# Patient Record
Sex: Female | Born: 1935
Health system: Southern US, Community
[De-identification: ages and names within clinical notes are randomized; demographics above are authoritative.]

## PROBLEM LIST (undated history)

## (undated) DIAGNOSIS — K219 Gastro-esophageal reflux disease without esophagitis: Secondary | ICD-10-CM

## (undated) DIAGNOSIS — M810 Age-related osteoporosis without current pathological fracture: Secondary | ICD-10-CM

## (undated) DIAGNOSIS — F32A Depression, unspecified: Secondary | ICD-10-CM

## (undated) DIAGNOSIS — A0472 Enterocolitis due to Clostridium difficile, not specified as recurrent: Secondary | ICD-10-CM

## (undated) DIAGNOSIS — C946 Myelodysplastic disease, not classified: Secondary | ICD-10-CM

## (undated) DIAGNOSIS — N189 Chronic kidney disease, unspecified: Secondary | ICD-10-CM

## (undated) DIAGNOSIS — E039 Hypothyroidism, unspecified: Secondary | ICD-10-CM

## (undated) DIAGNOSIS — D649 Anemia, unspecified: Secondary | ICD-10-CM

## (undated) DIAGNOSIS — F329 Major depressive disorder, single episode, unspecified: Secondary | ICD-10-CM

## (undated) DIAGNOSIS — M1990 Unspecified osteoarthritis, unspecified site: Secondary | ICD-10-CM

## (undated) DIAGNOSIS — R0600 Dyspnea, unspecified: Secondary | ICD-10-CM

## (undated) DIAGNOSIS — C801 Malignant (primary) neoplasm, unspecified: Secondary | ICD-10-CM

## (undated) HISTORY — PX: ABDOMINAL HYSTERECTOMY: SHX81

## (undated) HISTORY — PX: CATARACT EXTRACTION W/ INTRAOCULAR LENS IMPLANT: SHX1309

---

## 1996-08-05 HISTORY — PX: APPENDECTOMY: SHX54

## 1996-08-05 HISTORY — PX: CHOLECYSTECTOMY: SHX55

## 2000-09-10 ENCOUNTER — Other Ambulatory Visit: Admission: RE | Admit: 2000-09-10 | Discharge: 2000-09-10 | Payer: Self-pay | Admitting: Internal Medicine

## 2001-11-11 ENCOUNTER — Ambulatory Visit (HOSPITAL_COMMUNITY): Admission: RE | Admit: 2001-11-11 | Discharge: 2001-11-11 | Payer: Self-pay | Admitting: Gastroenterology

## 2001-11-11 ENCOUNTER — Encounter (INDEPENDENT_AMBULATORY_CARE_PROVIDER_SITE_OTHER): Payer: Self-pay | Admitting: Specialist

## 2006-12-03 ENCOUNTER — Ambulatory Visit (HOSPITAL_COMMUNITY): Admission: RE | Admit: 2006-12-03 | Discharge: 2006-12-03 | Payer: Self-pay | Admitting: Internal Medicine

## 2006-12-23 ENCOUNTER — Ambulatory Visit: Payer: Self-pay | Admitting: Vascular Surgery

## 2006-12-23 ENCOUNTER — Encounter (INDEPENDENT_AMBULATORY_CARE_PROVIDER_SITE_OTHER): Payer: Self-pay | Admitting: Internal Medicine

## 2006-12-23 ENCOUNTER — Ambulatory Visit: Admission: RE | Admit: 2006-12-23 | Discharge: 2006-12-23 | Payer: Self-pay | Admitting: Internal Medicine

## 2007-04-13 ENCOUNTER — Other Ambulatory Visit: Admission: RE | Admit: 2007-04-13 | Discharge: 2007-04-13 | Payer: Self-pay | Admitting: Gastroenterology

## 2007-12-09 ENCOUNTER — Ambulatory Visit (HOSPITAL_COMMUNITY): Admission: RE | Admit: 2007-12-09 | Discharge: 2007-12-09 | Payer: Self-pay | Admitting: Internal Medicine

## 2008-03-26 ENCOUNTER — Emergency Department (HOSPITAL_COMMUNITY): Admission: EM | Admit: 2008-03-26 | Discharge: 2008-03-26 | Payer: Self-pay | Admitting: Emergency Medicine

## 2008-12-26 ENCOUNTER — Ambulatory Visit (HOSPITAL_COMMUNITY): Admission: RE | Admit: 2008-12-26 | Discharge: 2008-12-26 | Payer: Self-pay | Admitting: Internal Medicine

## 2010-01-09 ENCOUNTER — Ambulatory Visit (HOSPITAL_COMMUNITY): Admission: RE | Admit: 2010-01-09 | Discharge: 2010-01-09 | Payer: Self-pay | Admitting: Internal Medicine

## 2010-12-21 NOTE — Op Note (Signed)
[facility]  Patient:    Alison Bridges, Alison Bridges Visit Number: [id number] MRN: [id number]          Service Type: END Location: ENDO Attending Physician:  Rich Brave Dictated by:   Florencia Reasons, M.D. Proc. Date: 11/11/01 Admit Date:  11/11/2001   CC:         Richard A. Jacky Kindle, M.D.   Operative Report  PROCEDURE:  Colonoscopy.  INDICATIONS:  A 77 year old female with prior history of adenomatous polyp, having been removed about five years ago.  I believe there is also a family history of colon cancer.  FINDINGS:  Normal exam.  DESCRIPTION OF PROCEDURE:  The nature, purpose and risks of the procedure were familiar to the patient from prior examinations, and she provided written consent.  Sedation for this procedure and the upper endoscopy which preceded it was fentanyl 62.5 mcg and Versed 6 mg IV; without arrhythmias or desaturation.  The Olympus adjustable tension pediatric video colonoscope was advanced to the cecum, with looping that was overcome by having the patient in the supine position, and applying some external abdominal compression. Pullback was then performed.  The cecum was identified by visualization of the ileocecal valve.  The absence of further lumen, and transillumination of the right lower quadrant.  The quality of the prep was very good, and it is felt that all areas were well seen.  This was a normal examination.  No polyps, cancer, colitis, vascular malformations or diverticulosis were noted.  Retroflexion into the rectum showed internal hemorrhoids.  Reinspection of the rectosigmoid was unremarkable.  The patient tolerated the procedure well and there were no apparent complications.  IMPRESSION:  Normal colonoscopy.  PLAN:  Follow-up colonoscopy in five years, because of the prior history of a colonic adenoma and the possible family history of colon cancer. Dictated by: Florencia Reasons, M.D. Attending  Physician:  Rich Brave DD:  11/11/01 TD:  11/11/01 Job: [id number]

## 2010-12-21 NOTE — Procedures (Signed)
[facility]  Patient:    Alison Bridges, Alison Bridges Visit Number: [id number] MRN: [id number]          Service Type: END Location: ENDO Attending Physician:  Rich Brave Dictated by:   Florencia Reasons, M.D. Proc. Date: 11/11/01 Admit Date:  11/11/2001   CC:         Richard A. Jacky Kindle, M.D.   Procedure Report  PROCEDURE:  Upper endoscopy without biopsies.  INDICATION:  This 77 year old female has a longstanding history of achalasia and more recently has had esophageal spasms which seem to have gotten better since going on Prevacid therapy.  She has been off Prevacid for a period of time now but still is basically asymptomatic from the esophageal standpoint. Rule out esophageal cancer as a possible complication of achalasia.  FINDINGS:  Multiple small gastric polyps.  DESCRIPTION OF PROCEDURE:  The patient provided written consent.  Sedation was fentanyl 50 mcg and Versed 4-5 mg IV without arrhythmias or desaturation.  The Olympus video endoscope was passed under direct vision.  The vocal cords were not well seen.  The esophagus was readily entered and had normal mucosa.  The esophageal lumen was somewhat patulous consistent with a history of achalasia, and there was some refluxed fluid retained within the esophagus which was suctioned up.  I saw no evidence of neoplasia or esophagitis, Barretts esophagus or varices or infection.  No ring or stricture was evident.  The squamocolumnar junction was right at the diaphragmatic hiatus; there was no evidence of a hiatal hernia. The lower esophageal sphincter region did not appear particularly snug.  The stomach was entered and noted to contain a rather large, roughly 150 cc, bilious residual which was suctioned up.  There was no evidence of bile reflux gastritis, but there were multiple (dozens) of small hyperplastic-appearing polyps scattered in the fundus and body of the stomach and even in  the proximal antrum.  Approximately 5 or 10 of these were biopsied for sampling; it is presumed they probably represent fundic gland polyps.  There did not appear to be any large polyps or dominant masses within the gastric lumen nor anything that really looked suspicious.  No gastritis, erosions, or ulcers. Retroflexed viewing showed a normal cardia without evidence of neoplasia. The pylorus, duodenal bulb, and second duodenum looked normal.  The patient tolerated this procedure well, and there were no apparent complications.  IMPRESSION: 1. Patulous esophagus consistent with history of achalasia. 2. Multiple gastric polyps, probably fundic gland polyps.  PLAN:  Await pathology.  Could restart PPI therapy p.r.n. in the event of recurrent esophageal symptoms. Dictated by:   Florencia Reasons, M.D. Attending Physician:  Rich Brave DD:  11/11/01 TD:  11/11/01 Job: [phone] [id number]

## 2011-01-22 ENCOUNTER — Ambulatory Visit (HOSPITAL_COMMUNITY): Payer: Medicare Other | Attending: Internal Medicine

## 2011-01-22 DIAGNOSIS — M810 Age-related osteoporosis without current pathological fracture: Secondary | ICD-10-CM | POA: Insufficient documentation

## 2011-11-19 DIAGNOSIS — E785 Hyperlipidemia, unspecified: Secondary | ICD-10-CM | POA: Diagnosis not present

## 2011-11-19 DIAGNOSIS — M810 Age-related osteoporosis without current pathological fracture: Secondary | ICD-10-CM | POA: Diagnosis not present

## 2011-11-19 DIAGNOSIS — E039 Hypothyroidism, unspecified: Secondary | ICD-10-CM | POA: Diagnosis not present

## 2011-11-19 DIAGNOSIS — R82998 Other abnormal findings in urine: Secondary | ICD-10-CM | POA: Diagnosis not present

## 2011-11-26 DIAGNOSIS — Z1212 Encounter for screening for malignant neoplasm of rectum: Secondary | ICD-10-CM | POA: Diagnosis not present

## 2011-11-26 DIAGNOSIS — M810 Age-related osteoporosis without current pathological fracture: Secondary | ICD-10-CM | POA: Diagnosis not present

## 2011-11-26 DIAGNOSIS — E785 Hyperlipidemia, unspecified: Secondary | ICD-10-CM | POA: Diagnosis not present

## 2011-11-26 DIAGNOSIS — E039 Hypothyroidism, unspecified: Secondary | ICD-10-CM | POA: Diagnosis not present

## 2011-11-26 DIAGNOSIS — Z0000 Encounter for general adult medical examination without abnormal findings: Secondary | ICD-10-CM | POA: Diagnosis not present

## 2011-11-26 DIAGNOSIS — Z79899 Other long term (current) drug therapy: Secondary | ICD-10-CM | POA: Diagnosis not present

## 2011-12-24 DIAGNOSIS — R198 Other specified symptoms and signs involving the digestive system and abdomen: Secondary | ICD-10-CM | POA: Diagnosis not present

## 2011-12-24 DIAGNOSIS — R143 Flatulence: Secondary | ICD-10-CM | POA: Diagnosis not present

## 2011-12-24 DIAGNOSIS — Z800 Family history of malignant neoplasm of digestive organs: Secondary | ICD-10-CM | POA: Diagnosis not present

## 2011-12-24 DIAGNOSIS — R141 Gas pain: Secondary | ICD-10-CM | POA: Diagnosis not present

## 2012-01-21 DIAGNOSIS — M810 Age-related osteoporosis without current pathological fracture: Secondary | ICD-10-CM | POA: Diagnosis not present

## 2012-02-11 ENCOUNTER — Other Ambulatory Visit: Payer: Self-pay | Admitting: Gastroenterology

## 2012-02-11 DIAGNOSIS — K599 Functional intestinal disorder, unspecified: Secondary | ICD-10-CM | POA: Diagnosis not present

## 2012-02-11 DIAGNOSIS — R198 Other specified symptoms and signs involving the digestive system and abdomen: Secondary | ICD-10-CM | POA: Diagnosis not present

## 2012-02-11 DIAGNOSIS — D126 Benign neoplasm of colon, unspecified: Secondary | ICD-10-CM | POA: Diagnosis not present

## 2012-02-11 DIAGNOSIS — Z800 Family history of malignant neoplasm of digestive organs: Secondary | ICD-10-CM | POA: Diagnosis not present

## 2012-02-18 DIAGNOSIS — M810 Age-related osteoporosis without current pathological fracture: Secondary | ICD-10-CM | POA: Diagnosis not present

## 2012-02-18 DIAGNOSIS — Z79899 Other long term (current) drug therapy: Secondary | ICD-10-CM | POA: Diagnosis not present

## 2012-02-27 ENCOUNTER — Encounter (HOSPITAL_COMMUNITY): Payer: Self-pay

## 2012-02-27 ENCOUNTER — Encounter (HOSPITAL_COMMUNITY)
Admission: RE | Admit: 2012-02-27 | Discharge: 2012-02-27 | Disposition: A | Discharge status: Home / Self Care | Payer: Medicare Other | Source: Ambulatory Visit | Attending: Internal Medicine | Admitting: Internal Medicine

## 2012-02-27 DIAGNOSIS — M810 Age-related osteoporosis without current pathological fracture: Secondary | ICD-10-CM | POA: Insufficient documentation

## 2012-02-27 HISTORY — DX: Hypothyroidism, unspecified: E03.9

## 2012-02-27 HISTORY — DX: Major depressive disorder, single episode, unspecified: F32.9

## 2012-02-27 HISTORY — DX: Age-related osteoporosis without current pathological fracture: M81.0

## 2012-02-27 HISTORY — DX: Depression, unspecified: F32.A

## 2012-02-27 MED ORDER — ZOLEDRONIC ACID 5 MG/100ML IV SOLN
5.0000 mg | Freq: Once | INTRAVENOUS | Status: AC
  Administered 2012-02-27: 5 mg via INTRAVENOUS
  Filled 2012-02-27: qty 100

## 2012-02-27 MED ORDER — SODIUM CHLORIDE 0.9 % IV SOLN
INTRAVENOUS | Status: AC
  Administered 2012-02-27: 14:00:00 via INTRAVENOUS

## 2012-03-12 DIAGNOSIS — R198 Other specified symptoms and signs involving the digestive system and abdomen: Secondary | ICD-10-CM | POA: Diagnosis not present

## 2012-03-12 DIAGNOSIS — R141 Gas pain: Secondary | ICD-10-CM | POA: Diagnosis not present

## 2012-03-12 DIAGNOSIS — R1033 Periumbilical pain: Secondary | ICD-10-CM | POA: Diagnosis not present

## 2012-03-12 DIAGNOSIS — R143 Flatulence: Secondary | ICD-10-CM | POA: Diagnosis not present

## 2012-03-19 DIAGNOSIS — Z803 Family history of malignant neoplasm of breast: Secondary | ICD-10-CM | POA: Diagnosis not present

## 2012-03-19 DIAGNOSIS — Z1231 Encounter for screening mammogram for malignant neoplasm of breast: Secondary | ICD-10-CM | POA: Diagnosis not present

## 2012-04-29 DIAGNOSIS — Z23 Encounter for immunization: Secondary | ICD-10-CM | POA: Diagnosis not present

## 2012-05-12 DIAGNOSIS — R141 Gas pain: Secondary | ICD-10-CM | POA: Diagnosis not present

## 2012-05-12 DIAGNOSIS — K5901 Slow transit constipation: Secondary | ICD-10-CM | POA: Diagnosis not present

## 2012-06-22 DIAGNOSIS — H5200 Hypermetropia, unspecified eye: Secondary | ICD-10-CM | POA: Diagnosis not present

## 2012-06-22 DIAGNOSIS — H52209 Unspecified astigmatism, unspecified eye: Secondary | ICD-10-CM | POA: Diagnosis not present

## 2012-06-22 DIAGNOSIS — H3340 Traction detachment of retina, unspecified eye: Secondary | ICD-10-CM | POA: Diagnosis not present

## 2012-06-22 DIAGNOSIS — H2510 Age-related nuclear cataract, unspecified eye: Secondary | ICD-10-CM | POA: Diagnosis not present

## 2012-07-16 DIAGNOSIS — R141 Gas pain: Secondary | ICD-10-CM | POA: Diagnosis not present

## 2012-07-16 DIAGNOSIS — R142 Eructation: Secondary | ICD-10-CM | POA: Diagnosis not present

## 2012-07-16 DIAGNOSIS — K5901 Slow transit constipation: Secondary | ICD-10-CM | POA: Diagnosis not present

## 2012-12-01 DIAGNOSIS — E039 Hypothyroidism, unspecified: Secondary | ICD-10-CM | POA: Diagnosis not present

## 2012-12-01 DIAGNOSIS — M810 Age-related osteoporosis without current pathological fracture: Secondary | ICD-10-CM | POA: Diagnosis not present

## 2012-12-01 DIAGNOSIS — Z79899 Other long term (current) drug therapy: Secondary | ICD-10-CM | POA: Diagnosis not present

## 2012-12-01 DIAGNOSIS — E785 Hyperlipidemia, unspecified: Secondary | ICD-10-CM | POA: Diagnosis not present

## 2012-12-09 DIAGNOSIS — IMO0002 Reserved for concepts with insufficient information to code with codable children: Secondary | ICD-10-CM | POA: Diagnosis not present

## 2012-12-09 DIAGNOSIS — Z0000 Encounter for general adult medical examination without abnormal findings: Secondary | ICD-10-CM | POA: Diagnosis not present

## 2012-12-09 DIAGNOSIS — M810 Age-related osteoporosis without current pathological fracture: Secondary | ICD-10-CM | POA: Diagnosis not present

## 2012-12-09 DIAGNOSIS — G609 Hereditary and idiopathic neuropathy, unspecified: Secondary | ICD-10-CM | POA: Diagnosis not present

## 2012-12-09 DIAGNOSIS — E785 Hyperlipidemia, unspecified: Secondary | ICD-10-CM | POA: Diagnosis not present

## 2012-12-09 DIAGNOSIS — E039 Hypothyroidism, unspecified: Secondary | ICD-10-CM | POA: Diagnosis not present

## 2012-12-09 DIAGNOSIS — M1990 Unspecified osteoarthritis, unspecified site: Secondary | ICD-10-CM | POA: Diagnosis not present

## 2012-12-10 DIAGNOSIS — Z1212 Encounter for screening for malignant neoplasm of rectum: Secondary | ICD-10-CM | POA: Diagnosis not present

## 2013-07-09 DIAGNOSIS — H2510 Age-related nuclear cataract, unspecified eye: Secondary | ICD-10-CM | POA: Diagnosis not present

## 2013-07-09 DIAGNOSIS — H35379 Puckering of macula, unspecified eye: Secondary | ICD-10-CM | POA: Diagnosis not present

## 2013-07-09 DIAGNOSIS — H25019 Cortical age-related cataract, unspecified eye: Secondary | ICD-10-CM | POA: Diagnosis not present

## 2013-10-28 DIAGNOSIS — R141 Gas pain: Secondary | ICD-10-CM | POA: Diagnosis not present

## 2013-10-28 DIAGNOSIS — R198 Other specified symptoms and signs involving the digestive system and abdomen: Secondary | ICD-10-CM | POA: Diagnosis not present

## 2013-10-28 DIAGNOSIS — R1084 Generalized abdominal pain: Secondary | ICD-10-CM | POA: Diagnosis not present

## 2013-12-06 DIAGNOSIS — E039 Hypothyroidism, unspecified: Secondary | ICD-10-CM | POA: Diagnosis not present

## 2013-12-06 DIAGNOSIS — Z79899 Other long term (current) drug therapy: Secondary | ICD-10-CM | POA: Diagnosis not present

## 2013-12-06 DIAGNOSIS — M810 Age-related osteoporosis without current pathological fracture: Secondary | ICD-10-CM | POA: Diagnosis not present

## 2013-12-06 DIAGNOSIS — E785 Hyperlipidemia, unspecified: Secondary | ICD-10-CM | POA: Diagnosis not present

## 2013-12-13 DIAGNOSIS — M1990 Unspecified osteoarthritis, unspecified site: Secondary | ICD-10-CM | POA: Diagnosis not present

## 2013-12-13 DIAGNOSIS — Z1331 Encounter for screening for depression: Secondary | ICD-10-CM | POA: Diagnosis not present

## 2013-12-13 DIAGNOSIS — G609 Hereditary and idiopathic neuropathy, unspecified: Secondary | ICD-10-CM | POA: Diagnosis not present

## 2013-12-13 DIAGNOSIS — Z0000 Encounter for general adult medical examination without abnormal findings: Secondary | ICD-10-CM | POA: Diagnosis not present

## 2013-12-13 DIAGNOSIS — M810 Age-related osteoporosis without current pathological fracture: Secondary | ICD-10-CM | POA: Diagnosis not present

## 2013-12-13 DIAGNOSIS — E785 Hyperlipidemia, unspecified: Secondary | ICD-10-CM | POA: Diagnosis not present

## 2013-12-14 DIAGNOSIS — Z1212 Encounter for screening for malignant neoplasm of rectum: Secondary | ICD-10-CM | POA: Diagnosis not present

## 2014-01-06 DIAGNOSIS — K591 Functional diarrhea: Secondary | ICD-10-CM | POA: Diagnosis not present

## 2014-01-25 DIAGNOSIS — M899 Disorder of bone, unspecified: Secondary | ICD-10-CM | POA: Diagnosis not present

## 2014-01-25 DIAGNOSIS — M949 Disorder of cartilage, unspecified: Secondary | ICD-10-CM | POA: Diagnosis not present

## 2014-04-14 DIAGNOSIS — R198 Other specified symptoms and signs involving the digestive system and abdomen: Secondary | ICD-10-CM | POA: Diagnosis not present

## 2014-04-14 DIAGNOSIS — R143 Flatulence: Secondary | ICD-10-CM | POA: Diagnosis not present

## 2014-04-14 DIAGNOSIS — R141 Gas pain: Secondary | ICD-10-CM | POA: Diagnosis not present

## 2014-05-04 DIAGNOSIS — Z23 Encounter for immunization: Secondary | ICD-10-CM | POA: Diagnosis not present

## 2014-05-24 DIAGNOSIS — Z1231 Encounter for screening mammogram for malignant neoplasm of breast: Secondary | ICD-10-CM | POA: Diagnosis not present

## 2014-07-22 DIAGNOSIS — H25013 Cortical age-related cataract, bilateral: Secondary | ICD-10-CM | POA: Diagnosis not present

## 2014-07-22 DIAGNOSIS — H2513 Age-related nuclear cataract, bilateral: Secondary | ICD-10-CM | POA: Diagnosis not present

## 2014-07-22 DIAGNOSIS — H35371 Puckering of macula, right eye: Secondary | ICD-10-CM | POA: Diagnosis not present

## 2014-11-18 DIAGNOSIS — Z1389 Encounter for screening for other disorder: Secondary | ICD-10-CM | POA: Diagnosis not present

## 2014-11-18 DIAGNOSIS — M810 Age-related osteoporosis without current pathological fracture: Secondary | ICD-10-CM | POA: Diagnosis not present

## 2014-11-18 DIAGNOSIS — G629 Polyneuropathy, unspecified: Secondary | ICD-10-CM | POA: Diagnosis not present

## 2014-11-18 DIAGNOSIS — Z6821 Body mass index (BMI) 21.0-21.9, adult: Secondary | ICD-10-CM | POA: Diagnosis not present

## 2014-11-18 DIAGNOSIS — E039 Hypothyroidism, unspecified: Secondary | ICD-10-CM | POA: Diagnosis not present

## 2014-11-18 DIAGNOSIS — E785 Hyperlipidemia, unspecified: Secondary | ICD-10-CM | POA: Diagnosis not present

## 2014-11-18 DIAGNOSIS — M1990 Unspecified osteoarthritis, unspecified site: Secondary | ICD-10-CM | POA: Diagnosis not present

## 2014-11-19 ENCOUNTER — Emergency Department (HOSPITAL_COMMUNITY): Payer: Medicare Other

## 2014-11-19 ENCOUNTER — Encounter (HOSPITAL_COMMUNITY): Payer: Self-pay | Admitting: Emergency Medicine

## 2014-11-19 ENCOUNTER — Emergency Department (HOSPITAL_COMMUNITY)
Admission: EM | Admit: 2014-11-19 | Discharge: 2014-11-19 | Disposition: A | Discharge status: Home / Self Care | Payer: Medicare Other | Attending: Emergency Medicine | Admitting: Emergency Medicine

## 2014-11-19 DIAGNOSIS — Y9389 Activity, other specified: Secondary | ICD-10-CM | POA: Diagnosis not present

## 2014-11-19 DIAGNOSIS — F329 Major depressive disorder, single episode, unspecified: Secondary | ICD-10-CM | POA: Insufficient documentation

## 2014-11-19 DIAGNOSIS — Y9289 Other specified places as the place of occurrence of the external cause: Secondary | ICD-10-CM | POA: Insufficient documentation

## 2014-11-19 DIAGNOSIS — M810 Age-related osteoporosis without current pathological fracture: Secondary | ICD-10-CM | POA: Insufficient documentation

## 2014-11-19 DIAGNOSIS — M7989 Other specified soft tissue disorders: Secondary | ICD-10-CM | POA: Diagnosis not present

## 2014-11-19 DIAGNOSIS — Z79899 Other long term (current) drug therapy: Secondary | ICD-10-CM | POA: Insufficient documentation

## 2014-11-19 DIAGNOSIS — W07XXXA Fall from chair, initial encounter: Secondary | ICD-10-CM | POA: Insufficient documentation

## 2014-11-19 DIAGNOSIS — Y998 Other external cause status: Secondary | ICD-10-CM | POA: Insufficient documentation

## 2014-11-19 DIAGNOSIS — S99912A Unspecified injury of left ankle, initial encounter: Secondary | ICD-10-CM | POA: Diagnosis present

## 2014-11-19 DIAGNOSIS — S82832A Other fracture of upper and lower end of left fibula, initial encounter for closed fracture: Secondary | ICD-10-CM | POA: Insufficient documentation

## 2014-11-19 DIAGNOSIS — M79672 Pain in left foot: Secondary | ICD-10-CM | POA: Diagnosis not present

## 2014-11-19 DIAGNOSIS — S99922A Unspecified injury of left foot, initial encounter: Secondary | ICD-10-CM | POA: Diagnosis not present

## 2014-11-19 DIAGNOSIS — E039 Hypothyroidism, unspecified: Secondary | ICD-10-CM | POA: Insufficient documentation

## 2014-11-19 DIAGNOSIS — S82402A Unspecified fracture of shaft of left fibula, initial encounter for closed fracture: Secondary | ICD-10-CM

## 2014-11-19 MED ORDER — OXYCODONE-ACETAMINOPHEN 5-325 MG PO TABS: 1.0000 | ORAL_TABLET | Freq: Four times a day (QID) | ORAL | Status: DC | PRN

## 2014-11-19 MED ORDER — DOCUSATE SODIUM 100 MG PO CAPS: 100.0000 mg | ORAL_CAPSULE | Freq: Every day | ORAL | Status: DC

## 2014-11-19 NOTE — ED Notes (Addendum)
Pt from home c/o left ankle pain from trying to get up from chair and rolling ankle. Swelling and bruising noted to ankle. No blood thinner use. She has been taking percocet from dental surgery a few days ago. Last dose was 0500.

## 2014-11-19 NOTE — ED Provider Notes (Signed)
CSN: [id number]     Arrival date & time 11/19/14  0735 History   First MD Initiated Contact with Patient 11/19/14 [phone]     Chief Complaint  Patient presents with  . Ankle Injury     (Consider location/radiation/quality/duration/timing/severity/associated sxs/prior Treatment) Patient is a 81 y.o. female presenting with lower extremity injury. The history is provided by the patient.  Ankle Injury This is a new problem. The current episode started 6 to 12 hours ago. Episode frequency: once. The problem has been resolved. Pertinent negatives include no chest pain, no abdominal pain, no headaches and no shortness of breath. Nothing aggravates the symptoms. Nothing relieves the symptoms. Treatments tried: percocet. The treatment provided moderate relief.    Past Medical History  Diagnosis Date  . Hypothyroidism   . Depression   . Osteoporosis    Past Surgical History  Procedure Laterality Date  . Abdominal hysterectomy    . Appendectomy    . Cholecystectomy     No family history on file. History  Substance Use Topics  . Smoking status: Never Smoker   . Smokeless tobacco: Not on file  . Alcohol Use: No   OB History    No data available     Review of Systems  Constitutional: Negative for fever and fatigue.  HENT: Negative for congestion and drooling.   Eyes: Negative for pain.  Respiratory: Negative for cough and shortness of breath.   Cardiovascular: Negative for chest pain.  Gastrointestinal: Negative for nausea, vomiting, abdominal pain and diarrhea.  Genitourinary: Negative for dysuria and hematuria.  Musculoskeletal: Negative for back pain, gait problem and neck pain.  Skin: Negative for color change.  Neurological: Negative for dizziness and headaches.  Hematological: Negative for adenopathy.  Psychiatric/Behavioral: Negative for behavioral problems.  All other systems reviewed and are negative.     Allergies  Review of patient's allergies indicates no known  allergies.  Home Medications   Prior to Admission medications   Medication Sig Start Date End Date Taking? Authorizing Provider  calcium-vitamin D (OSCAL WITH D) 500-200 MG-UNIT per tablet Take 1 tablet by mouth 2 (two) times daily.    Historical Provider, MD  levothyroxine (SYNTHROID, LEVOTHROID) 112 MCG tablet Take 112 mcg by mouth daily.    Historical Provider, MD  loratadine (CLARITIN) 10 MG tablet Take 10 mg by mouth daily.    Historical Provider, MD  sertraline (ZOLOFT) 50 MG tablet Take 50 mg by mouth daily.    Historical Provider, MD  simvastatin (ZOCOR) 20 MG tablet Take 20 mg by mouth every evening.    Historical Provider, MD   BP 154/81 mmHg  Pulse 90  Temp(Src) 98.2 F (36.8 C)  Resp 18  SpO2 98% Physical Exam  Constitutional: She is oriented to person, place, and time. She appears well-developed and well-nourished.  HENT:  Head: Normocephalic.  Mouth/Throat: Oropharynx is clear and moist. No oropharyngeal exudate.  Eyes: Conjunctivae and EOM are normal. Pupils are equal, round, and reactive to light.  Neck: Normal range of motion. Neck supple.  No vertebral ttp.   Cardiovascular: Normal rate, regular rhythm, normal heart sounds and intact distal pulses.  Exam reveals no gallop and no friction rub.   No murmur heard. Pulmonary/Chest: Effort normal and breath sounds normal. No respiratory distress. She has no wheezes.  Abdominal: Soft. Bowel sounds are normal. There is no tenderness. There is no rebound and no guarding.  Musculoskeletal: Normal range of motion. She exhibits tenderness. She exhibits no edema.  Mild to  moderate swelling and bruising to the left lateral ankle extending to the left lateral foot. Mild tenderness in this area.  2+ distal pulses in the left lower extremity.  Sensation and motor skills intact in the left lower extremity.  No focal hip ttp.   Pt ambulatory in cam walker.   Neurological: She is alert and oriented to person, place, and time.   Skin: Skin is warm and dry.  Psychiatric: She has a normal mood and affect. Her behavior is normal.  Nursing note and vitals reviewed.   ED Course  Procedures (including critical care time) Labs Review Labs Reviewed - No data to display  Imaging Review Dg Ankle Complete Left  11/19/2014   CLINICAL DATA:  Pt c/o lateral foot/ankle pain, swelling since fall last night while getting out of recliner to go to bed, pt report she was "groggy" and her right hip "gave out," causing her to fall and land on her left side  EXAM: LEFT ANKLE COMPLETE - 3+ VIEW  COMPARISON:  None.  FINDINGS: There is a nondisplaced fracture across the inferior tip of the distal fibula, approximately 17 mm below the ankle joint. On the AP view, too small bone fragments are seen lateral to the anterior talus and calcaneus which may reflect additional avulsion fractures.  There are no other fractures. The ankle mortise is normally space and aligned. Bones are demineralized. There is soft tissue swelling which predominates laterally.  IMPRESSION: 1. Nondisplaced fracture of the inferior margin of the distal fibula with possible additional avulsion fractures from the lateral margin of the calcaneus. 2. No other fractures.  Ankle joint normally aligned.   Electronically Signed   By: Lajean Manes M.D.   On: 11/19/2014 08:44   Dg Foot Complete Left  11/19/2014   CLINICAL DATA:  Pt c/o lateral foot/ankle pain, swelling since fall last night while getting out of recliner to go to bed, pt report she was "groggy" and her right hip "gave out," causing her to fall and land on her left side  EXAM: LEFT FOOT - COMPLETE 3+ VIEW  COMPARISON:  None.  FINDINGS: No fracture. Joints are normally aligned. Bones are demineralized. Soft tissues are unremarkable.  IMPRESSION: No fracture or dislocation.   Electronically Signed   By: Lajean Manes M.D.   On: 11/19/2014 08:42     EKG Interpretation None      MDM   Final diagnoses:  Left fibular  fracture, closed, initial encounter    7:47 AM 81 y.o. female history of osteoporosis who presents with a mechanical fall which occurred yesterday evening at about 10 PM. She states that she had some teeth extracted several days ago and has been on Percocet and antibiotics. She was getting up from a chair when her right hip gave way and she displaced way on her left ankle falling to the ground. She denies hitting her head or loss of consciousness. She only complains of left ankle pain on exam. Currently 2-3 out of 10. She has been ambulatory since then. Otherwise well appearing and has no other complaints. Will get screening plain films.  10:00 AM: Discussed case with Dr. Veverly Fells who is ok w/ cam walker. Pt also has a walker at home she can use. Pt ambulatory here in her camwalker. I have discussed the diagnosis/risks/treatment options with the patient and believe the pt to be eligible for discharge home to follow-up with Dr. Veverly Fells in the next week. We also discussed returning to the ED immediately  if new or worsening sx occur. We discussed the sx which are most concerning (e.g., worsening pain, recurrent falls) that necessitate immediate return. Medications administered to the patient during their visit and any new prescriptions provided to the patient are listed below.  Medications given during this visit Medications - No data to display  New Prescriptions   DOCUSATE SODIUM (COLACE) 100 MG CAPSULE    Take 1 capsule (100 mg total) by mouth daily.   OXYCODONE-ACETAMINOPHEN (PERCOCET) 5-325 MG PER TABLET    Take 1 tablet by mouth every 6 (six) hours as needed for moderate pain.     Pamella Pert, MD 11/19/14 1002

## 2014-11-24 DIAGNOSIS — S99912A Unspecified injury of left ankle, initial encounter: Secondary | ICD-10-CM | POA: Diagnosis not present

## 2014-11-28 DIAGNOSIS — M1990 Unspecified osteoarthritis, unspecified site: Secondary | ICD-10-CM | POA: Diagnosis not present

## 2014-11-28 DIAGNOSIS — Z6821 Body mass index (BMI) 21.0-21.9, adult: Secondary | ICD-10-CM | POA: Diagnosis not present

## 2014-11-28 DIAGNOSIS — E039 Hypothyroidism, unspecified: Secondary | ICD-10-CM | POA: Diagnosis not present

## 2014-11-28 DIAGNOSIS — R8290 Unspecified abnormal findings in urine: Secondary | ICD-10-CM | POA: Diagnosis not present

## 2014-11-28 DIAGNOSIS — E785 Hyperlipidemia, unspecified: Secondary | ICD-10-CM | POA: Diagnosis not present

## 2014-11-28 DIAGNOSIS — R0781 Pleurodynia: Secondary | ICD-10-CM | POA: Diagnosis not present

## 2014-11-28 DIAGNOSIS — N390 Urinary tract infection, site not specified: Secondary | ICD-10-CM | POA: Diagnosis not present

## 2014-11-28 DIAGNOSIS — M810 Age-related osteoporosis without current pathological fracture: Secondary | ICD-10-CM | POA: Diagnosis not present

## 2014-11-28 DIAGNOSIS — G629 Polyneuropathy, unspecified: Secondary | ICD-10-CM | POA: Diagnosis not present

## 2014-11-28 DIAGNOSIS — R109 Unspecified abdominal pain: Secondary | ICD-10-CM | POA: Diagnosis not present

## 2014-12-08 DIAGNOSIS — S99912D Unspecified injury of left ankle, subsequent encounter: Secondary | ICD-10-CM | POA: Diagnosis not present

## 2014-12-09 DIAGNOSIS — Z79899 Other long term (current) drug therapy: Secondary | ICD-10-CM | POA: Diagnosis not present

## 2014-12-09 DIAGNOSIS — E039 Hypothyroidism, unspecified: Secondary | ICD-10-CM | POA: Diagnosis not present

## 2014-12-09 DIAGNOSIS — E785 Hyperlipidemia, unspecified: Secondary | ICD-10-CM | POA: Diagnosis not present

## 2014-12-09 DIAGNOSIS — M810 Age-related osteoporosis without current pathological fracture: Secondary | ICD-10-CM | POA: Diagnosis not present

## 2014-12-16 DIAGNOSIS — M810 Age-related osteoporosis without current pathological fracture: Secondary | ICD-10-CM | POA: Diagnosis not present

## 2014-12-16 DIAGNOSIS — M1990 Unspecified osteoarthritis, unspecified site: Secondary | ICD-10-CM | POA: Diagnosis not present

## 2014-12-16 DIAGNOSIS — Z0000 Encounter for general adult medical examination without abnormal findings: Secondary | ICD-10-CM | POA: Diagnosis not present

## 2014-12-16 DIAGNOSIS — E785 Hyperlipidemia, unspecified: Secondary | ICD-10-CM | POA: Diagnosis not present

## 2014-12-16 DIAGNOSIS — Z6821 Body mass index (BMI) 21.0-21.9, adult: Secondary | ICD-10-CM | POA: Diagnosis not present

## 2014-12-16 DIAGNOSIS — E039 Hypothyroidism, unspecified: Secondary | ICD-10-CM | POA: Diagnosis not present

## 2014-12-16 DIAGNOSIS — G629 Polyneuropathy, unspecified: Secondary | ICD-10-CM | POA: Diagnosis not present

## 2014-12-16 DIAGNOSIS — R2689 Other abnormalities of gait and mobility: Secondary | ICD-10-CM | POA: Diagnosis not present

## 2014-12-16 DIAGNOSIS — Z23 Encounter for immunization: Secondary | ICD-10-CM | POA: Diagnosis not present

## 2014-12-29 DIAGNOSIS — Z1212 Encounter for screening for malignant neoplasm of rectum: Secondary | ICD-10-CM | POA: Diagnosis not present

## 2015-01-05 DIAGNOSIS — S82832D Other fracture of upper and lower end of left fibula, subsequent encounter for closed fracture with routine healing: Secondary | ICD-10-CM | POA: Diagnosis not present

## 2015-04-28 DIAGNOSIS — R002 Palpitations: Secondary | ICD-10-CM | POA: Diagnosis not present

## 2015-04-28 DIAGNOSIS — I491 Atrial premature depolarization: Secondary | ICD-10-CM | POA: Diagnosis not present

## 2015-04-28 DIAGNOSIS — E039 Hypothyroidism, unspecified: Secondary | ICD-10-CM | POA: Diagnosis not present

## 2015-04-28 DIAGNOSIS — Z6820 Body mass index (BMI) 20.0-20.9, adult: Secondary | ICD-10-CM | POA: Diagnosis not present

## 2015-04-28 DIAGNOSIS — R5383 Other fatigue: Secondary | ICD-10-CM | POA: Diagnosis not present

## 2015-05-13 DIAGNOSIS — Z23 Encounter for immunization: Secondary | ICD-10-CM | POA: Diagnosis not present

## 2015-07-27 DIAGNOSIS — H2513 Age-related nuclear cataract, bilateral: Secondary | ICD-10-CM | POA: Diagnosis not present

## 2015-07-27 DIAGNOSIS — H35371 Puckering of macula, right eye: Secondary | ICD-10-CM | POA: Diagnosis not present

## 2015-07-27 DIAGNOSIS — H25013 Cortical age-related cataract, bilateral: Secondary | ICD-10-CM | POA: Diagnosis not present

## 2015-08-03 DIAGNOSIS — Z1231 Encounter for screening mammogram for malignant neoplasm of breast: Secondary | ICD-10-CM | POA: Diagnosis not present

## 2015-08-03 DIAGNOSIS — Z803 Family history of malignant neoplasm of breast: Secondary | ICD-10-CM | POA: Diagnosis not present

## 2015-08-29 DIAGNOSIS — H25011 Cortical age-related cataract, right eye: Secondary | ICD-10-CM | POA: Diagnosis not present

## 2015-08-29 DIAGNOSIS — H25811 Combined forms of age-related cataract, right eye: Secondary | ICD-10-CM | POA: Diagnosis not present

## 2015-08-29 DIAGNOSIS — H2511 Age-related nuclear cataract, right eye: Secondary | ICD-10-CM | POA: Diagnosis not present

## 2015-09-19 DIAGNOSIS — H2512 Age-related nuclear cataract, left eye: Secondary | ICD-10-CM | POA: Diagnosis not present

## 2015-09-19 DIAGNOSIS — H25012 Cortical age-related cataract, left eye: Secondary | ICD-10-CM | POA: Diagnosis not present

## 2015-09-19 DIAGNOSIS — H25812 Combined forms of age-related cataract, left eye: Secondary | ICD-10-CM | POA: Diagnosis not present

## 2015-12-15 DIAGNOSIS — R8290 Unspecified abnormal findings in urine: Secondary | ICD-10-CM | POA: Diagnosis not present

## 2015-12-15 DIAGNOSIS — M810 Age-related osteoporosis without current pathological fracture: Secondary | ICD-10-CM | POA: Diagnosis not present

## 2015-12-15 DIAGNOSIS — E038 Other specified hypothyroidism: Secondary | ICD-10-CM | POA: Diagnosis not present

## 2015-12-15 DIAGNOSIS — N390 Urinary tract infection, site not specified: Secondary | ICD-10-CM | POA: Diagnosis not present

## 2015-12-15 DIAGNOSIS — E784 Other hyperlipidemia: Secondary | ICD-10-CM | POA: Diagnosis not present

## 2016-01-15 DIAGNOSIS — G629 Polyneuropathy, unspecified: Secondary | ICD-10-CM | POA: Diagnosis not present

## 2016-01-15 DIAGNOSIS — I491 Atrial premature depolarization: Secondary | ICD-10-CM | POA: Diagnosis not present

## 2016-01-15 DIAGNOSIS — E784 Other hyperlipidemia: Secondary | ICD-10-CM | POA: Diagnosis not present

## 2016-01-15 DIAGNOSIS — M1990 Unspecified osteoarthritis, unspecified site: Secondary | ICD-10-CM | POA: Diagnosis not present

## 2016-01-15 DIAGNOSIS — E038 Other specified hypothyroidism: Secondary | ICD-10-CM | POA: Diagnosis not present

## 2016-01-15 DIAGNOSIS — Z6821 Body mass index (BMI) 21.0-21.9, adult: Secondary | ICD-10-CM | POA: Diagnosis not present

## 2016-01-15 DIAGNOSIS — Z1389 Encounter for screening for other disorder: Secondary | ICD-10-CM | POA: Diagnosis not present

## 2016-01-15 DIAGNOSIS — M810 Age-related osteoporosis without current pathological fracture: Secondary | ICD-10-CM | POA: Diagnosis not present

## 2016-01-15 DIAGNOSIS — Z0000 Encounter for general adult medical examination without abnormal findings: Secondary | ICD-10-CM | POA: Diagnosis not present

## 2016-01-15 DIAGNOSIS — K5900 Constipation, unspecified: Secondary | ICD-10-CM | POA: Diagnosis not present

## 2016-01-15 DIAGNOSIS — R002 Palpitations: Secondary | ICD-10-CM | POA: Diagnosis not present

## 2016-01-18 DIAGNOSIS — Z1212 Encounter for screening for malignant neoplasm of rectum: Secondary | ICD-10-CM | POA: Diagnosis not present

## 2016-05-04 DIAGNOSIS — Z23 Encounter for immunization: Secondary | ICD-10-CM | POA: Diagnosis not present

## 2016-07-24 DIAGNOSIS — Z6821 Body mass index (BMI) 21.0-21.9, adult: Secondary | ICD-10-CM | POA: Diagnosis not present

## 2016-07-24 DIAGNOSIS — E784 Other hyperlipidemia: Secondary | ICD-10-CM | POA: Diagnosis not present

## 2016-07-24 DIAGNOSIS — K5909 Other constipation: Secondary | ICD-10-CM | POA: Diagnosis not present

## 2016-07-24 DIAGNOSIS — I491 Atrial premature depolarization: Secondary | ICD-10-CM | POA: Diagnosis not present

## 2016-07-24 DIAGNOSIS — R002 Palpitations: Secondary | ICD-10-CM | POA: Diagnosis not present

## 2016-07-24 DIAGNOSIS — M1990 Unspecified osteoarthritis, unspecified site: Secondary | ICD-10-CM | POA: Diagnosis not present

## 2016-07-24 DIAGNOSIS — G6289 Other specified polyneuropathies: Secondary | ICD-10-CM | POA: Diagnosis not present

## 2016-07-24 DIAGNOSIS — M810 Age-related osteoporosis without current pathological fracture: Secondary | ICD-10-CM | POA: Diagnosis not present

## 2016-07-24 DIAGNOSIS — E038 Other specified hypothyroidism: Secondary | ICD-10-CM | POA: Diagnosis not present

## 2016-08-12 DIAGNOSIS — Z6821 Body mass index (BMI) 21.0-21.9, adult: Secondary | ICD-10-CM | POA: Diagnosis not present

## 2016-08-12 DIAGNOSIS — J019 Acute sinusitis, unspecified: Secondary | ICD-10-CM | POA: Diagnosis not present

## 2016-09-03 DIAGNOSIS — Z1231 Encounter for screening mammogram for malignant neoplasm of breast: Secondary | ICD-10-CM | POA: Diagnosis not present

## 2016-09-03 DIAGNOSIS — Z803 Family history of malignant neoplasm of breast: Secondary | ICD-10-CM | POA: Diagnosis not present

## 2016-10-24 DIAGNOSIS — Z961 Presence of intraocular lens: Secondary | ICD-10-CM | POA: Diagnosis not present

## 2016-10-24 DIAGNOSIS — H52203 Unspecified astigmatism, bilateral: Secondary | ICD-10-CM | POA: Diagnosis not present

## 2016-10-24 DIAGNOSIS — H43821 Vitreomacular adhesion, right eye: Secondary | ICD-10-CM | POA: Diagnosis not present

## 2017-01-08 DIAGNOSIS — R8290 Unspecified abnormal findings in urine: Secondary | ICD-10-CM | POA: Diagnosis not present

## 2017-01-08 DIAGNOSIS — M810 Age-related osteoporosis without current pathological fracture: Secondary | ICD-10-CM | POA: Diagnosis not present

## 2017-01-08 DIAGNOSIS — Z79899 Other long term (current) drug therapy: Secondary | ICD-10-CM | POA: Diagnosis not present

## 2017-01-08 DIAGNOSIS — E784 Other hyperlipidemia: Secondary | ICD-10-CM | POA: Diagnosis not present

## 2017-01-08 DIAGNOSIS — E038 Other specified hypothyroidism: Secondary | ICD-10-CM | POA: Diagnosis not present

## 2017-01-15 DIAGNOSIS — E784 Other hyperlipidemia: Secondary | ICD-10-CM | POA: Diagnosis not present

## 2017-01-15 DIAGNOSIS — Z0000 Encounter for general adult medical examination without abnormal findings: Secondary | ICD-10-CM | POA: Diagnosis not present

## 2017-01-15 DIAGNOSIS — G6289 Other specified polyneuropathies: Secondary | ICD-10-CM | POA: Diagnosis not present

## 2017-01-15 DIAGNOSIS — I491 Atrial premature depolarization: Secondary | ICD-10-CM | POA: Diagnosis not present

## 2017-01-15 DIAGNOSIS — Z6821 Body mass index (BMI) 21.0-21.9, adult: Secondary | ICD-10-CM | POA: Diagnosis not present

## 2017-01-15 DIAGNOSIS — R002 Palpitations: Secondary | ICD-10-CM | POA: Diagnosis not present

## 2017-01-15 DIAGNOSIS — M810 Age-related osteoporosis without current pathological fracture: Secondary | ICD-10-CM | POA: Diagnosis not present

## 2017-01-15 DIAGNOSIS — K5909 Other constipation: Secondary | ICD-10-CM | POA: Diagnosis not present

## 2017-01-15 DIAGNOSIS — M1990 Unspecified osteoarthritis, unspecified site: Secondary | ICD-10-CM | POA: Diagnosis not present

## 2017-01-15 DIAGNOSIS — Z1389 Encounter for screening for other disorder: Secondary | ICD-10-CM | POA: Diagnosis not present

## 2017-01-15 DIAGNOSIS — E038 Other specified hypothyroidism: Secondary | ICD-10-CM | POA: Diagnosis not present

## 2017-01-16 DIAGNOSIS — Z1212 Encounter for screening for malignant neoplasm of rectum: Secondary | ICD-10-CM | POA: Diagnosis not present

## 2017-03-11 DIAGNOSIS — E038 Other specified hypothyroidism: Secondary | ICD-10-CM | POA: Diagnosis not present

## 2017-03-13 DIAGNOSIS — Z6821 Body mass index (BMI) 21.0-21.9, adult: Secondary | ICD-10-CM | POA: Diagnosis not present

## 2017-03-13 DIAGNOSIS — E038 Other specified hypothyroidism: Secondary | ICD-10-CM | POA: Diagnosis not present

## 2017-03-13 DIAGNOSIS — J392 Other diseases of pharynx: Secondary | ICD-10-CM | POA: Diagnosis not present

## 2017-05-13 DIAGNOSIS — E038 Other specified hypothyroidism: Secondary | ICD-10-CM | POA: Diagnosis not present

## 2017-05-17 DIAGNOSIS — Z23 Encounter for immunization: Secondary | ICD-10-CM | POA: Diagnosis not present

## 2017-07-23 DIAGNOSIS — G629 Polyneuropathy, unspecified: Secondary | ICD-10-CM | POA: Diagnosis not present

## 2017-07-23 DIAGNOSIS — E7849 Other hyperlipidemia: Secondary | ICD-10-CM | POA: Diagnosis not present

## 2017-07-23 DIAGNOSIS — I491 Atrial premature depolarization: Secondary | ICD-10-CM | POA: Diagnosis not present

## 2017-07-23 DIAGNOSIS — M1990 Unspecified osteoarthritis, unspecified site: Secondary | ICD-10-CM | POA: Diagnosis not present

## 2017-07-23 DIAGNOSIS — E038 Other specified hypothyroidism: Secondary | ICD-10-CM | POA: Diagnosis not present

## 2017-07-23 DIAGNOSIS — Z6821 Body mass index (BMI) 21.0-21.9, adult: Secondary | ICD-10-CM | POA: Diagnosis not present

## 2017-07-23 DIAGNOSIS — K5900 Constipation, unspecified: Secondary | ICD-10-CM | POA: Diagnosis not present

## 2017-07-23 DIAGNOSIS — M810 Age-related osteoporosis without current pathological fracture: Secondary | ICD-10-CM | POA: Diagnosis not present

## 2017-09-03 DIAGNOSIS — R002 Palpitations: Secondary | ICD-10-CM | POA: Diagnosis not present

## 2017-09-03 DIAGNOSIS — I491 Atrial premature depolarization: Secondary | ICD-10-CM | POA: Diagnosis not present

## 2017-09-03 DIAGNOSIS — Z6821 Body mass index (BMI) 21.0-21.9, adult: Secondary | ICD-10-CM | POA: Diagnosis not present

## 2017-09-03 DIAGNOSIS — E7849 Other hyperlipidemia: Secondary | ICD-10-CM | POA: Diagnosis not present

## 2017-09-03 DIAGNOSIS — E038 Other specified hypothyroidism: Secondary | ICD-10-CM | POA: Diagnosis not present

## 2017-09-30 DIAGNOSIS — Z803 Family history of malignant neoplasm of breast: Secondary | ICD-10-CM | POA: Diagnosis not present

## 2017-09-30 DIAGNOSIS — Z1231 Encounter for screening mammogram for malignant neoplasm of breast: Secondary | ICD-10-CM | POA: Diagnosis not present

## 2017-10-30 DIAGNOSIS — Z961 Presence of intraocular lens: Secondary | ICD-10-CM | POA: Diagnosis not present

## 2017-11-25 ENCOUNTER — Encounter: Payer: Self-pay | Admitting: Cardiology

## 2017-11-25 ENCOUNTER — Ambulatory Visit (INDEPENDENT_AMBULATORY_CARE_PROVIDER_SITE_OTHER): Payer: Medicare Other | Admitting: Cardiology

## 2017-11-25 DIAGNOSIS — R002 Palpitations: Secondary | ICD-10-CM | POA: Insufficient documentation

## 2017-11-25 NOTE — Progress Notes (Signed)
PCP: Burnard Bunting, MD  Clinic Note: Chief Complaint  Patient presents with  . New Patient (Initial Visit)    palpitations    HPI: Alison Bridges is a 84 y.o. female with a PMH below who is being seen today for the evaluation of PACs & Palpitations at the request of Burnard Bunting, MD.  Alison Bridges was referred after being seen by Dr. Reynaldo Minium.  Earlier this year she had an EKG that did show some PACs.  Recent Hospitalizations: n/a  Studies Personally Reviewed - (if available, images/films reviewed: From Epic Chart or Care Everywhere)  n/a  Interval History: Alison Bridges  is a very pleasant elderly woman who appears to be quite healthy with minimal medical history on Synthroid and Zoloft with seasonal allergy related Claritin only.  She has been noticing that she has been feeling tired of late.  And then back in January when she went to go see Dr. Joya Salm she had noted having some palpitations at that time and an EKG suggested an abnormality.  She usually notes these episodes early in the morning when she first wakes up she basically feels her heart dancing in her chest.  They do not last more than 48minutes and often times last less than a minute.  They resolve spontaneously.  When they occur she will feel some facial flushing and a little lightheadedness with some wooziness, but no syncope or near syncope.  Interestingly, she has not had any of these episodes in the last couple weeks.  She does not notice the symptoms when she gets up out and about doing things.  She is not sure if the symptoms are related to the fact that her thyroid dosing is changed, and thinks that some unusual symptoms in her chest occasionally may be related to her GERD.  She does have a history of esophageal stricture caught leading to dilation.  She denies any resting or exertional dyspnea or chest pain.   No PND, orthopnea or edema.  No syncope/near syncope. No TIA/amaurosis fugax symptoms.  No  claudication.  ROS: A comprehensive was performed. Review of Systems  Constitutional: Negative for malaise/fatigue (This easily fatigued).  HENT: Negative for nosebleeds.   Respiratory: Positive for shortness of breath (Only with palpitations). Negative for cough and sputum production.   Cardiovascular:       Per HPI  Gastrointestinal: Negative for blood in stool and melena.  Genitourinary: Negative for dysuria and hematuria.  Musculoskeletal: Negative for falls, joint pain and myalgias.  Neurological: Positive for tingling (She occasionally notes that her right arm will get numb and tingly as it is going to sleep.  She shakes around "wake it up ".). Negative for weakness.  Endo/Heme/Allergies: Positive for environmental allergies (Not bothering her much since she has been taking her Claritin regularly.).  Psychiatric/Behavioral: Negative for depression (Controlled) and memory loss. The patient is nervous/anxious. The patient does not have insomnia.   All other systems reviewed and are negative.   I have reviewed and (if needed) personally updated the patient's problem list, medications, allergies, past medical and surgical history, social and family history.   Past Medical History:  Diagnosis Date  . Depression   . Hypothyroidism   . Osteoporosis     Past Surgical History:  Procedure Laterality Date  . ABDOMINAL HYSTERECTOMY    . APPENDECTOMY  1998  . CHOLECYSTECTOMY  1998    Current Meds  Medication Sig  . cholecalciferol (VITAMIN D) 1000 UNITS tablet Take 1,000 Units by  mouth daily.  Marland Kitchen levothyroxine (SYNTHROID, LEVOTHROID) 125 MCG tablet Take 125 mcg by mouth daily before breakfast.  . loratadine (CLARITIN) 10 MG tablet Take 10 mg by mouth daily.  . sertraline (ZOLOFT) 50 MG tablet Take 50 mg by mouth daily.    No Known Allergies  Social History   Tobacco Use  . Smoking status: Never Smoker  . Smokeless tobacco: Never Used  Substance Use Topics  . Alcohol use: No    . 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  She also enjoys going for walks, but no standard routine.      She has 3 children and 2 grandchildren.    family history includes Dementia in her brother; Diabetes in her brother; Heart attack (age of onset: 43) in her mother; Heart disease in her brother; Pancreatic cancer in her father; Parkinson's disease in her sister.  Wt Readings from Last 3 Encounters:  11/25/17 140 lb 9.6 oz (63.8 kg)    PHYSICAL EXAM BP 134/75 (BP Location: Right Arm)   Pulse 83   Ht 5\' 8"  (1.727 m)   Wt 140 lb 9.6 oz (63.8 kg)   BMI 21.38 kg/m  Physical Exam  Constitutional: She is oriented to person, place, and time. She appears well-developed and well-nourished. No distress.  Healthy-appearing.  Well-groomed  HENT:  Head: Normocephalic and atraumatic.  Mouth/Throat: No oropharyngeal exudate.  Eyes: Pupils are equal, round, and reactive to light. Conjunctivae are normal. No scleral icterus.  Neck: Normal range of motion. Neck supple. No hepatojugular reflux and no JVD present. Carotid bruit is not present. No thyromegaly present.  Cardiovascular: Normal rate, regular rhythm, normal heart sounds and intact distal pulses.  No extrasystoles are present. PMI is not displaced. Exam reveals no gallop and no friction rub.  No murmur heard. Pulmonary/Chest: Effort normal and breath sounds normal. No respiratory distress. She has no wheezes. She has no rales.  Abdominal: Soft. Bowel sounds are normal. She exhibits no distension. There is no tenderness. There is no rebound.  Musculoskeletal: Normal range of motion. She exhibits no edema.  Neurological: She is alert and oriented to person, place, and time. No cranial nerve deficit.  Skin: Skin is warm and dry.  Psychiatric: She has a normal mood and affect. Her behavior is normal. Judgment and thought content normal.  Nursing  note and vitals reviewed.    Adult ECG Report  Rate: 83;  Rhythm: normal sinus rhythm; LAFB/left axis deviation (-60 deg ) normal intervals and durations.  Narrative Interpretation: essentially normal EKG   Other studies Reviewed: Additional studies/ records that were reviewed today include:  Recent Labs:  None available     ASSESSMENT / PLAN: Problem List Items Addressed This Visit    Palpitations   Relevant Orders   EKG 12-Lead (Completed)     Earnestine presents here today baseline with having episodes of what sounds like tachypalpitations, but does not seem to be overly worried about them.  It does not sound as if they are truly an arrhythmia as they are relatively short-lived.  She herself is questioning whether or not has to do with her adjusted dose of Synthroid.  I discussed with her various options of evaluation including but not limited to 48-hour to 2-week monitor in order to capture episodes.  Also would consider 2D echocardiogram to evaluate for structural abnormalities.  At  this point I probably would not consider doing additional testing unless she becomes more symptomatic.  After going to the options with her and suggesting that she does wear a 2-week monitor, she agreed.  However when she was being checked out by my nurse, she changed her mind and decided to hold off on any evaluation until she has time to think about her thyroid treatment.  We will reassess, and if she chooses to, order 2-week event monitor.   I spent a total of 35 minutes with the patient and chart review. >  50% of the time was spent in direct patient consultation.   Current medicines are reviewed at length with the patient today.  (+/- concerns)  n/a The following changes have been made:  n/a  Patient Instructions  MEDICATION INSTRUCTIONS  NO CHANGES AT PRESENT    RECOMMEND A 2 WEEK EVENT MONITOR- CALL OFFICE WHEN YOU ARE READY TO HAVE IT PLACED. Your physician has recommended that you wear an  event monitor. Event monitors are medical devices that record the heart's electrical activity. Doctors most often [place] these monitors to diagnose arrhythmias. Arrhythmias are problems with the speed or rhythm of the heartbeat. The monitor is a small, portable device. You can wear one while you do your normal daily activities. This is usually used to diagnose what is causing palpitations/syncope (passing out).    Your physician recommends that you schedule a follow-up appointment if when you wear a monitor and it is placed and it is abnormal.    She will contact us as to whether or not she would like to have evaluation done or to follow-up.   Studies Ordered:   Orders Placed This Encounter  Procedures  . EKG 12-Lead      Glenetta Hew, M.D., M.S. Interventional Cardiologist   Pager # [phone] Phone # [phone] [address] [place], [place] [address]   Thank you for choosing [facility] at [facility]!!

## 2017-11-25 NOTE — Patient Instructions (Addendum)
MEDICATION INSTRUCTIONS  NO CHANGES AT PRESENT    RECOMMEND A 2 WEEK EVENT MONITOR- CALL OFFICE WHEN YOU ARE READY TO HAVE IT PLACED. Your physician has recommended that you wear an event monitor. Event monitors are medical devices that record the heart's electrical activity. Doctors most often [place] these monitors to diagnose arrhythmias. Arrhythmias are problems with the speed or rhythm of the heartbeat. The monitor is a small, portable device. You can wear one while you do your normal daily activities. This is usually used to diagnose what is causing palpitations/syncope (passing out).    Your physician recommends that you schedule a follow-up appointment if when you wear a monitor and it is placed and it is abnormal.

## 2017-11-26 ENCOUNTER — Encounter: Payer: Self-pay | Admitting: Cardiology

## 2017-12-03 ENCOUNTER — Telehealth: Payer: Self-pay | Admitting: Cardiology

## 2017-12-03 DIAGNOSIS — R002 Palpitations: Secondary | ICD-10-CM

## 2017-12-03 NOTE — Telephone Encounter (Signed)
New message   Patient requesting order for holter monitor

## 2017-12-03 NOTE — Telephone Encounter (Signed)
Order entered and pt aware scheduler will call and make an appt ./cy

## 2017-12-09 ENCOUNTER — Ambulatory Visit (INDEPENDENT_AMBULATORY_CARE_PROVIDER_SITE_OTHER): Payer: Medicare Other

## 2017-12-09 DIAGNOSIS — R002 Palpitations: Secondary | ICD-10-CM

## 2018-01-13 ENCOUNTER — Telehealth: Payer: Self-pay | Admitting: Cardiology

## 2018-01-13 NOTE — Telephone Encounter (Signed)
Spoke to patient. Result given . Verbalized understanding  Patient does not need a follow up appointment

## 2018-01-13 NOTE — Telephone Encounter (Signed)
No result yet, pt notified.

## 2018-01-13 NOTE — Telephone Encounter (Signed)
Just reviewed the monitor.  Mostly sinus rhythm very very rare PACs and PVCs.  Most of the irregular heartbeat episodes that were triggered did not have any abnormal findings.  Normal study  Glenetta Hew, MD

## 2018-01-13 NOTE — Telephone Encounter (Signed)
New Message:       Pt is calling for results of a test she had done last week.

## 2018-01-19 DIAGNOSIS — E7849 Other hyperlipidemia: Secondary | ICD-10-CM | POA: Diagnosis not present

## 2018-01-19 DIAGNOSIS — M810 Age-related osteoporosis without current pathological fracture: Secondary | ICD-10-CM | POA: Diagnosis not present

## 2018-01-19 DIAGNOSIS — E038 Other specified hypothyroidism: Secondary | ICD-10-CM | POA: Diagnosis not present

## 2018-01-19 DIAGNOSIS — R82998 Other abnormal findings in urine: Secondary | ICD-10-CM | POA: Diagnosis not present

## 2018-01-26 DIAGNOSIS — Z1389 Encounter for screening for other disorder: Secondary | ICD-10-CM | POA: Diagnosis not present

## 2018-01-26 DIAGNOSIS — Z6821 Body mass index (BMI) 21.0-21.9, adult: Secondary | ICD-10-CM | POA: Diagnosis not present

## 2018-01-26 DIAGNOSIS — K5909 Other constipation: Secondary | ICD-10-CM | POA: Diagnosis not present

## 2018-01-26 DIAGNOSIS — I491 Atrial premature depolarization: Secondary | ICD-10-CM | POA: Diagnosis not present

## 2018-01-26 DIAGNOSIS — M1990 Unspecified osteoarthritis, unspecified site: Secondary | ICD-10-CM | POA: Diagnosis not present

## 2018-01-26 DIAGNOSIS — R002 Palpitations: Secondary | ICD-10-CM | POA: Diagnosis not present

## 2018-01-26 DIAGNOSIS — E038 Other specified hypothyroidism: Secondary | ICD-10-CM | POA: Diagnosis not present

## 2018-01-26 DIAGNOSIS — Z0000 Encounter for general adult medical examination without abnormal findings: Secondary | ICD-10-CM | POA: Diagnosis not present

## 2018-01-26 DIAGNOSIS — M810 Age-related osteoporosis without current pathological fracture: Secondary | ICD-10-CM | POA: Diagnosis not present

## 2018-01-26 DIAGNOSIS — E7849 Other hyperlipidemia: Secondary | ICD-10-CM | POA: Diagnosis not present

## 2018-01-26 DIAGNOSIS — G6289 Other specified polyneuropathies: Secondary | ICD-10-CM | POA: Diagnosis not present

## 2018-01-30 DIAGNOSIS — Z1212 Encounter for screening for malignant neoplasm of rectum: Secondary | ICD-10-CM | POA: Diagnosis not present

## 2018-03-24 DIAGNOSIS — E038 Other specified hypothyroidism: Secondary | ICD-10-CM | POA: Diagnosis not present

## 2018-05-09 DIAGNOSIS — Z23 Encounter for immunization: Secondary | ICD-10-CM | POA: Diagnosis not present

## 2018-07-08 DIAGNOSIS — Z6821 Body mass index (BMI) 21.0-21.9, adult: Secondary | ICD-10-CM | POA: Diagnosis not present

## 2018-07-08 DIAGNOSIS — K5900 Constipation, unspecified: Secondary | ICD-10-CM | POA: Diagnosis not present

## 2018-07-08 DIAGNOSIS — E7849 Other hyperlipidemia: Secondary | ICD-10-CM | POA: Diagnosis not present

## 2018-07-08 DIAGNOSIS — E038 Other specified hypothyroidism: Secondary | ICD-10-CM | POA: Diagnosis not present

## 2018-10-19 DIAGNOSIS — K1329 Other disturbances of oral epithelium, including tongue: Secondary | ICD-10-CM | POA: Diagnosis not present

## 2018-12-16 DIAGNOSIS — K1321 Leukoplakia of oral mucosa, including tongue: Secondary | ICD-10-CM | POA: Diagnosis not present

## 2018-12-16 DIAGNOSIS — B370 Candidal stomatitis: Secondary | ICD-10-CM | POA: Diagnosis not present

## 2019-01-25 DIAGNOSIS — E038 Other specified hypothyroidism: Secondary | ICD-10-CM | POA: Diagnosis not present

## 2019-01-25 DIAGNOSIS — E7849 Other hyperlipidemia: Secondary | ICD-10-CM | POA: Diagnosis not present

## 2019-01-27 DIAGNOSIS — R82998 Other abnormal findings in urine: Secondary | ICD-10-CM | POA: Diagnosis not present

## 2019-02-01 DIAGNOSIS — R002 Palpitations: Secondary | ICD-10-CM | POA: Diagnosis not present

## 2019-02-01 DIAGNOSIS — R202 Paresthesia of skin: Secondary | ICD-10-CM | POA: Diagnosis not present

## 2019-02-01 DIAGNOSIS — M1990 Unspecified osteoarthritis, unspecified site: Secondary | ICD-10-CM | POA: Diagnosis not present

## 2019-02-01 DIAGNOSIS — E785 Hyperlipidemia, unspecified: Secondary | ICD-10-CM | POA: Diagnosis not present

## 2019-02-01 DIAGNOSIS — Z1331 Encounter for screening for depression: Secondary | ICD-10-CM | POA: Diagnosis not present

## 2019-02-01 DIAGNOSIS — Z0000 Encounter for general adult medical examination without abnormal findings: Secondary | ICD-10-CM | POA: Diagnosis not present

## 2019-02-01 DIAGNOSIS — G629 Polyneuropathy, unspecified: Secondary | ICD-10-CM | POA: Diagnosis not present

## 2019-02-01 DIAGNOSIS — M810 Age-related osteoporosis without current pathological fracture: Secondary | ICD-10-CM | POA: Diagnosis not present

## 2019-02-01 DIAGNOSIS — E039 Hypothyroidism, unspecified: Secondary | ICD-10-CM | POA: Diagnosis not present

## 2019-02-01 DIAGNOSIS — I491 Atrial premature depolarization: Secondary | ICD-10-CM | POA: Diagnosis not present

## 2019-02-01 DIAGNOSIS — Z1339 Encounter for screening examination for other mental health and behavioral disorders: Secondary | ICD-10-CM | POA: Diagnosis not present

## 2019-02-01 DIAGNOSIS — K5900 Constipation, unspecified: Secondary | ICD-10-CM | POA: Diagnosis not present

## 2019-04-23 DIAGNOSIS — Z23 Encounter for immunization: Secondary | ICD-10-CM | POA: Diagnosis not present

## 2019-06-01 DIAGNOSIS — Z20818 Contact with and (suspected) exposure to other bacterial communicable diseases: Secondary | ICD-10-CM | POA: Diagnosis not present

## 2019-06-01 DIAGNOSIS — D649 Anemia, unspecified: Secondary | ICD-10-CM | POA: Diagnosis not present

## 2019-06-01 DIAGNOSIS — R202 Paresthesia of skin: Secondary | ICD-10-CM | POA: Diagnosis not present

## 2019-06-03 ENCOUNTER — Other Ambulatory Visit: Payer: Self-pay

## 2019-06-03 ENCOUNTER — Emergency Department (HOSPITAL_COMMUNITY): Payer: Medicare Other

## 2019-06-03 ENCOUNTER — Encounter (HOSPITAL_COMMUNITY): Payer: Self-pay | Admitting: Emergency Medicine

## 2019-06-03 ENCOUNTER — Emergency Department (HOSPITAL_COMMUNITY)
Admission: EM | Admit: 2019-06-03 | Discharge: 2019-06-03 | Disposition: A | Discharge status: Home / Self Care | Payer: Medicare Other | Attending: Emergency Medicine | Admitting: Emergency Medicine

## 2019-06-03 DIAGNOSIS — Z79899 Other long term (current) drug therapy: Secondary | ICD-10-CM | POA: Diagnosis not present

## 2019-06-03 DIAGNOSIS — R0602 Shortness of breath: Secondary | ICD-10-CM | POA: Diagnosis not present

## 2019-06-03 DIAGNOSIS — Z20828 Contact with and (suspected) exposure to other viral communicable diseases: Secondary | ICD-10-CM | POA: Diagnosis not present

## 2019-06-03 DIAGNOSIS — E039 Hypothyroidism, unspecified: Secondary | ICD-10-CM | POA: Diagnosis not present

## 2019-06-03 DIAGNOSIS — R042 Hemoptysis: Secondary | ICD-10-CM | POA: Diagnosis not present

## 2019-06-03 DIAGNOSIS — R52 Pain, unspecified: Secondary | ICD-10-CM

## 2019-06-03 DIAGNOSIS — R072 Precordial pain: Secondary | ICD-10-CM | POA: Diagnosis not present

## 2019-06-03 DIAGNOSIS — R5383 Other fatigue: Secondary | ICD-10-CM | POA: Diagnosis not present

## 2019-06-03 LAB — CBC WITH DIFFERENTIAL/PLATELET
Abs Immature Granulocytes: 0.14 10*3/uL — ABNORMAL HIGH (ref 0.00–0.07)
Basophils Absolute: 0 10*3/uL (ref 0.0–0.1)
Basophils Relative: 1 %
Eosinophils Absolute: 0 10*3/uL (ref 0.0–0.5)
Eosinophils Relative: 0 %
HCT: 35.4 % — ABNORMAL LOW (ref 36.0–46.0)
Hemoglobin: 11.4 g/dL — ABNORMAL LOW (ref 12.0–15.0)
Immature Granulocytes: 4 %
Lymphocytes Relative: 32 %
Lymphs Abs: 1.3 10*3/uL (ref 0.7–4.0)
MCH: 33.6 pg (ref 26.0–34.0)
MCHC: 32.2 g/dL (ref 30.0–36.0)
MCV: 104.4 fL — ABNORMAL HIGH (ref 80.0–100.0)
Monocytes Absolute: 1.1 10*3/uL — ABNORMAL HIGH (ref 0.1–1.0)
Monocytes Relative: 27 %
Neutro Abs: 1.5 10*3/uL — ABNORMAL LOW (ref 1.7–7.7)
Neutrophils Relative %: 36 %
Platelets: 161 10*3/uL (ref 150–400)
RBC: 3.39 MIL/uL — ABNORMAL LOW (ref 3.87–5.11)
RDW: 13.5 % (ref 11.5–15.5)
WBC: 4 10*3/uL (ref 4.0–10.5)
nRBC: 0 % (ref 0.0–0.2)

## 2019-06-03 LAB — COMPREHENSIVE METABOLIC PANEL
ALT: 19 U/L (ref 0–44)
AST: 20 U/L (ref 15–41)
Albumin: 4 g/dL (ref 3.5–5.0)
Alkaline Phosphatase: 69 U/L (ref 38–126)
Anion gap: 8 (ref 5–15)
BUN: 18 mg/dL (ref 8–23)
CO2: 23 mmol/L (ref 22–32)
Calcium: 9.1 mg/dL (ref 8.9–10.3)
Chloride: 104 mmol/L (ref 98–111)
Creatinine, Ser: 0.91 mg/dL (ref 0.44–1.00)
GFR calc Af Amer: >60 mL/min (ref >60)
GFR calc non Af Amer: 56 mL/min — ABNORMAL LOW (ref >60)
Glucose, Bld: 86 mg/dL (ref 70–99)
Potassium: 4.4 mmol/L (ref 3.5–5.1)
Sodium: 135 mmol/L (ref 135–145)
Total Bilirubin: 0.5 mg/dL (ref 0.3–1.2)
Total Protein: 8 g/dL (ref 6.5–8.1)

## 2019-06-03 LAB — URINALYSIS, ROUTINE W REFLEX MICROSCOPIC
Bilirubin Urine: NEGATIVE
Glucose, UA: NEGATIVE mg/dL
Hgb urine dipstick: NEGATIVE
Ketones, ur: NEGATIVE mg/dL
Leukocytes,Ua: NEGATIVE
Nitrite: NEGATIVE
Protein, ur: NEGATIVE mg/dL
Specific Gravity, Urine: 1.005 (ref 1.005–1.030)
pH: 7 (ref 5.0–8.0)

## 2019-06-03 LAB — ABO/RH: ABO/RH(D): O POS

## 2019-06-03 LAB — TYPE AND SCREEN
ABO/RH(D): O POS
Antibody Screen: NEGATIVE

## 2019-06-03 LAB — POC OCCULT BLOOD, ED: Fecal Occult Bld: NEGATIVE

## 2019-06-03 LAB — TROPONIN I (HIGH SENSITIVITY)
Troponin I (High Sensitivity): 3 ng/L (ref <18)
Troponin I (High Sensitivity): 3 ng/L (ref <18)

## 2019-06-03 MED ORDER — IOHEXOL 350 MG/ML SOLN
100.0000 mL | Freq: Once | INTRAVENOUS | Status: AC | PRN
  Administered 2019-06-03: 100 mL via INTRAVENOUS

## 2019-06-03 MED ORDER — SODIUM CHLORIDE 0.9 % IV BOLUS
500.0000 mL | Freq: Once | INTRAVENOUS | Status: AC
  Administered 2019-06-03: 500 mL via INTRAVENOUS

## 2019-06-03 MED ORDER — SODIUM CHLORIDE (PF) 0.9 % IJ SOLN
INTRAMUSCULAR | Status: AC
  Filled 2019-06-03: qty 50

## 2019-06-03 NOTE — Discharge Instructions (Signed)
Follow-up with Dr. Jacquiline Doe office for your test results tomorrow.  Make sure that you are eating and drinking properly.  Stay on a low acid diet.  You do have nodules in your lungs and one in your kidney that need further outpatient follow-up that can be arranged by Dr. Reynaldo Minium.  Return to the emergency room if you have any worsening symptoms.

## 2019-06-03 NOTE — ED Notes (Signed)
Ambulated pt in room while monitoring oxygen level. Oxygen maintained 96% and above for the duration.

## 2019-06-03 NOTE — ED Triage Notes (Signed)
Pt reports been fatigued for month. Reports has blood in her spit for couple days. Denies taking blood thinners. Reports will "get winded sometimes". Had Covid test this past Tuesdays but hasnt received results yet.

## 2019-06-03 NOTE — ED Provider Notes (Signed)
[facility] DEPT Provider Note   CSN: [id number] Arrival date & time: 06/03/19  1136     History   Chief Complaint Chief Complaint  Patient presents with  . Fatigue  . Shortness of Breath  . blood in spit    HPI Alison Bridges is a 85 y.o. female.     Patient is a 85 year old female who presents with weakness and shortness of breath.  She has a history of reflux and hypothyroidism.  She states over the last 2 weeks she has had increased fatigue.  She has decreased energy and some increased shortness of breath.  She denies any cough although she has some seasonal allergies and has a little bit of postnasal drip.  Over the last 2 days she has noticed a little bit of blood in her saliva.  She does not report coughing up any blood.  There is no vomiting blood.  She has no associated nausea or vomiting.  She denies any blood in her stool although she says her stools a little darker than normal.  She was seen by her PCP on Tuesday and had some labs which she said showed some anemia.  She is still waiting for the rest of the results including a chest x-ray and Covid test.  She denies any fevers.  No sore throat.  No chest congestion.  She did have some chest pain.  She reports some intermittent pain to the center of her chest is been going on for about the last 2 weeks.  It seems to have started after she was raking some leaves.  She says it comes and goes but she cannot really tell what brings it on.  She does not seem to have any exertional symptoms.  No leg pain or swelling.     Past Medical History:  Diagnosis Date  . Depression   . Hypothyroidism   . Osteoporosis     Patient Active Problem List   Diagnosis Date Noted  . Palpitations 11/25/2017    Past Surgical History:  Procedure Laterality Date  . ABDOMINAL HYSTERECTOMY    . APPENDECTOMY  1998  . CHOLECYSTECTOMY  1998     OB History   No obstetric history on file.      Home Medications     Prior to Admission medications   Medication Sig Start Date End Date Taking? Authorizing Provider  cholecalciferol (VITAMIN D) 1000 UNITS tablet Take 1,000 Units by mouth daily.   Yes [provider]  levothyroxine (SYNTHROID, LEVOTHROID) 125 MCG tablet Take 125 mcg by mouth daily before breakfast.   Yes [provider]  loratadine (CLARITIN) 10 MG tablet Take 10 mg by mouth daily.   Yes [provider]  pantoprazole (PROTONIX) 40 MG tablet Take 40 mg by mouth daily.   Yes [provider]  polyethylene glycol (MIRALAX) 17 g packet Take 17 g by mouth daily as needed for mild constipation.   Yes [provider]  sertraline (ZOLOFT) 50 MG tablet Take 50 mg by mouth daily.   Yes [provider]    Family History Family History  Problem Relation Age of Onset  . Heart attack Mother 83  . Pancreatic cancer Father   . Parkinson's disease Sister   . Diabetes Brother        Multiple complications  . Heart disease Brother        Not sure what happened  . Dementia Brother     Social History Social  History   Tobacco Use  . Smoking status: Never Smoker  . Smokeless tobacco: Never Used  Substance Use Topics  . Alcohol use: No  . Drug use: No     Allergies   Patient has no known allergies.   Review of Systems Review of Systems  Constitutional: Positive for fatigue. Negative for chills, diaphoresis and fever.  HENT: Positive for postnasal drip. Negative for congestion, rhinorrhea and sneezing.   Eyes: Negative.   Respiratory: Positive for shortness of breath. Negative for cough and chest tightness.   Cardiovascular: Positive for chest pain. Negative for leg swelling.  Gastrointestinal: Negative for abdominal pain, blood in stool, diarrhea, nausea and vomiting.  Genitourinary: Negative for difficulty urinating, flank pain, frequency and hematuria.  Musculoskeletal: Negative for arthralgias and back pain.  Skin: Negative for rash.   Neurological: Negative for dizziness, speech difficulty, weakness, numbness and headaches.     Physical Exam Updated Vital Signs BP (!) 148/72   Pulse 74   Temp 98.3 F (36.8 C) (Oral)   Resp 15   SpO2 97%   Physical Exam Constitutional:      Appearance: She is well-developed.  HENT:     Head: Normocephalic and atraumatic.  Eyes:     Pupils: Pupils are equal, round, and reactive to light.  Neck:     Musculoskeletal: Normal range of motion and neck supple.  Cardiovascular:     Rate and Rhythm: Normal rate and regular rhythm.     Heart sounds: Normal heart sounds.  Pulmonary:     Effort: Pulmonary effort is normal. No respiratory distress.     Breath sounds: Normal breath sounds. No wheezing or rales.  Chest:     Chest wall: Tenderness (Mild tenderness on palpation of the right parasternal chest wall, no crepitus or deformity) present.  Abdominal:     General: Bowel sounds are normal.     Palpations: Abdomen is soft.     Tenderness: There is no abdominal tenderness. There is no guarding or rebound.  Musculoskeletal: Normal range of motion.     Right lower leg: No edema.     Left lower leg: No edema.  Lymphadenopathy:     Cervical: No cervical adenopathy.  Skin:    General: Skin is warm and dry.     Findings: No rash.  Neurological:     Mental Status: She is alert and oriented to person, place, and time.      ED Treatments / Results  Labs (all labs ordered are listed, but only abnormal results are displayed) Labs Reviewed  COMPREHENSIVE METABOLIC PANEL - Abnormal; Notable for the following components:      Result Value   GFR calc non Af Amer 56 (*)    All other components within normal limits  CBC WITH DIFFERENTIAL/PLATELET - Abnormal; Notable for the following components:   RBC 3.39 (*)    Hemoglobin 11.4 (*)    HCT 35.4 (*)    MCV 104.4 (*)    Neutro Abs 1.5 (*)    Monocytes Absolute 1.1 (*)    Abs Immature Granulocytes 0.14 (*)    All other components  within normal limits  URINALYSIS, ROUTINE W REFLEX MICROSCOPIC - Abnormal; Notable for the following components:   Color, Urine STRAW (*)    All other components within normal limits  POC OCCULT BLOOD, ED  TYPE AND SCREEN  ABO/RH  TROPONIN I (HIGH SENSITIVITY)  TROPONIN I (HIGH SENSITIVITY)    EKG EKG Interpretation  Date/Time:  Thursday June 03 2019 11:52:14 EDT Ventricular Rate:  91 PR Interval:    QRS Duration: 80 QT Interval:  358 QTC Calculation: 441 R Axis:   -27 Text Interpretation: Sinus rhythm Atrial premature complex Borderline left axis deviation Abnormal R-wave progression, early transition Baseline wander in lead(s) V1 No old tracing to compare Confirmed by Malvin Johns [phone]) on 06/03/2019 12:06:52 PM   Radiology Ct Angio Chest Pe W And/or Wo Contrast  Result Date: 06/03/2019 CLINICAL DATA:  Fatigue for months, hemoptysis for couple days, gets winded, pending COVID-19 test, high clinical pretest probability of pulmonary embolism EXAM: CT ANGIOGRAPHY CHEST WITH CONTRAST TECHNIQUE: Multidetector CT imaging of the chest was performed using the standard protocol during bolus administration of intravenous contrast. Multiplanar CT image reconstructions and MIPs were obtained to evaluate the vascular anatomy. CONTRAST:  178mL OMNIPAQUE IOHEXOL 350 MG/ML SOLN IV COMPARISON:  None FINDINGS: Cardiovascular: Extensive atherosclerotic calcifications aorta, less and proximal great vessels and coronary arteries. Aorta normal caliber without aneurysm or dissection. No pericardial effusion. Pulmonary arteries adequately opacified and patent. No evidence of pulmonary embolism. Mediastinum/Nodes: Distended esophagus throughout its length containing air and fluid question reflux versus dysmotility. Few small calcifications in RIGHT thyroid lobe. Base of cervical region otherwise normal appearance. No thoracic adenopathy. Lungs/Pleura: Scattered calcified pulmonary granulomata. 3 mm  noncalcified RIGHT upper lobe nodule image 46. Additional noncalcified LEFT upper lobe nodules 4 mm image 26 and 5 mm image 32. Additional tiny nonspecific nodules. No pulmonary infiltrate, pleural effusion, or pneumothorax. Upper Abdomen: Gallbladder surgically absent. Tiny calculus at upper pole LEFT kidney. Cyst anteriorly at upper pole LEFT kidney 5.4 x 4.6 cm image 106 incompletely visualized. Musculoskeletal: Diffuse osseous demineralization. No acute bony findings. Review of the MIP images confirms the above findings. IMPRESSION: No evidence of pulmonary embolism. Stents of atherosclerotic disease changes including within coronary arteries. Dilated esophagus containing air-fluid level throughout, could be due to dysmotility or reflux. Large cyst at upper pole LEFT kidney 5.4 cm diameter, incompletely visualized, can be assessed by follow-up non emergent renal sonography. Calcified andnoncalcified BILATERAL pulmonary nodules up to 5 mm, recommendation below. No follow-up needed if patient is low-risk (and has no known or suspected primary neoplasm). Non-contrast chest CT can be considered in 12 months if patient is high-risk. This recommendation follows the consensus statement: Guidelines for Management of Incidental Pulmonary Nodules Detected on CT Images: From the Fleischner Society 2017; Radiology 2017; 284:228-243. Aortic Atherosclerosis (ICD10-I70.0). Electronically Signed   By: Lavonia Dana M.D.   On: 06/03/2019 17:12   Dg Chest Port 1 View  Result Date: 06/03/2019 CLINICAL DATA:  Fatigue. EXAM: PORTABLE CHEST 1 VIEW COMPARISON:  None. FINDINGS: The heart size and mediastinal contours are within normal limits. Both lungs are clear. The visualized skeletal structures are unremarkable. IMPRESSION: No active disease. Aortic Atherosclerosis (ICD10-I70.0). Electronically Signed   By: Marijo Conception M.D.   On: 06/03/2019 13:23    Procedures Procedures (including critical care time)  Medications  Ordered in ED Medications  sodium chloride (PF) 0.9 % injection (has no administration in time range)  sodium chloride 0.9 % bolus 500 mL (500 mLs Intravenous Bolus 06/03/19 1602)  iohexol (OMNIPAQUE) 350 MG/ML injection 100 mL (100 mLs Intravenous Contrast Given 06/03/19 1653)     Initial Impression / Assessment and Plan / ED Course  I have reviewed the triage vital signs and the nursing notes.  Pertinent labs & imaging results that were available during my care of the patient were reviewed by  me and considered in my medical decision making (see chart for details).        Patient is a 85 year old female who presents with fatigue and shortness of breath.  Her labs are nonconcerning.  She has minimal anemia but I do not feel that this would be the etiology of her symptoms.  Her stool Hemoccult was negative for blood.  She reported spitting up a small amount of blood and because of this a CT angio was performed of her chest which showed no acute abnormality.  There is pulmonary nodule and a nodule in her kidney that will need further outpatient follow-up.  This was explained to the patient and her son.  She has no hypoxia.  She was able to ambulate around the room without hypoxia.  Her electrolytes and kidney function are normal.  There is no evidence of pneumonia.  No evidence of PE.  She does not have any symptoms that are more concerning for ACS.  Her troponins are negative.  Her EKG does not show any ischemic changes.  She has pain in the parasternal area that has been going on since she was raking leaves and is reproducible on palpation.  She also has a little burning in the center of her esophagus which I feel is potentially related to reflux.  There is some evidence of reflux on her CT scan.  She has started on a PPI yesterday and will continue this.  She is awaiting her Covid test that was done by her PCPs office.  This should be back tomorrow.  She was advised to continue isolation  precautions.  She was advised to follow-up with her PCP as directed and return here as needed for any worsening symptoms.  Final Clinical Impressions(s) / ED Diagnoses   Final diagnoses:  Fatigue, unspecified type  Shortness of breath    ED Discharge Orders    None       Malvin Johns, MD 06/03/19 1754

## 2019-06-10 ENCOUNTER — Other Ambulatory Visit (HOSPITAL_COMMUNITY): Payer: Self-pay | Admitting: Internal Medicine

## 2019-06-10 DIAGNOSIS — R5383 Other fatigue: Secondary | ICD-10-CM

## 2019-06-18 ENCOUNTER — Other Ambulatory Visit (HOSPITAL_COMMUNITY): Payer: Medicare Other

## 2019-06-28 DIAGNOSIS — G4700 Insomnia, unspecified: Secondary | ICD-10-CM | POA: Diagnosis not present

## 2019-06-28 DIAGNOSIS — R5383 Other fatigue: Secondary | ICD-10-CM | POA: Diagnosis not present

## 2019-06-28 DIAGNOSIS — D649 Anemia, unspecified: Secondary | ICD-10-CM | POA: Diagnosis not present

## 2019-06-28 DIAGNOSIS — F329 Major depressive disorder, single episode, unspecified: Secondary | ICD-10-CM | POA: Diagnosis not present

## 2019-06-28 DIAGNOSIS — F418 Other specified anxiety disorders: Secondary | ICD-10-CM | POA: Diagnosis not present

## 2019-07-06 DIAGNOSIS — F329 Major depressive disorder, single episode, unspecified: Secondary | ICD-10-CM | POA: Diagnosis not present

## 2019-07-06 DIAGNOSIS — F418 Other specified anxiety disorders: Secondary | ICD-10-CM | POA: Diagnosis not present

## 2019-07-06 DIAGNOSIS — G4700 Insomnia, unspecified: Secondary | ICD-10-CM | POA: Diagnosis not present

## 2019-07-06 DIAGNOSIS — D649 Anemia, unspecified: Secondary | ICD-10-CM | POA: Diagnosis not present

## 2019-07-06 DIAGNOSIS — R5383 Other fatigue: Secondary | ICD-10-CM | POA: Diagnosis not present

## 2019-09-28 DIAGNOSIS — E038 Other specified hypothyroidism: Secondary | ICD-10-CM | POA: Diagnosis not present

## 2019-09-28 DIAGNOSIS — E7849 Other hyperlipidemia: Secondary | ICD-10-CM | POA: Diagnosis not present

## 2019-10-06 DIAGNOSIS — K219 Gastro-esophageal reflux disease without esophagitis: Secondary | ICD-10-CM | POA: Diagnosis not present

## 2019-10-06 DIAGNOSIS — D649 Anemia, unspecified: Secondary | ICD-10-CM | POA: Diagnosis not present

## 2019-10-06 DIAGNOSIS — R5383 Other fatigue: Secondary | ICD-10-CM | POA: Diagnosis not present

## 2019-10-06 DIAGNOSIS — R1030 Lower abdominal pain, unspecified: Secondary | ICD-10-CM | POA: Diagnosis not present

## 2019-10-06 DIAGNOSIS — R0609 Other forms of dyspnea: Secondary | ICD-10-CM | POA: Diagnosis not present

## 2019-10-07 DIAGNOSIS — Z90710 Acquired absence of both cervix and uterus: Secondary | ICD-10-CM | POA: Diagnosis not present

## 2019-10-07 DIAGNOSIS — Z9049 Acquired absence of other specified parts of digestive tract: Secondary | ICD-10-CM | POA: Diagnosis not present

## 2019-10-07 DIAGNOSIS — N281 Cyst of kidney, acquired: Secondary | ICD-10-CM | POA: Diagnosis not present

## 2019-10-07 DIAGNOSIS — N200 Calculus of kidney: Secondary | ICD-10-CM | POA: Diagnosis not present

## 2019-10-07 DIAGNOSIS — K5790 Diverticulosis of intestine, part unspecified, without perforation or abscess without bleeding: Secondary | ICD-10-CM | POA: Diagnosis not present

## 2019-10-22 DIAGNOSIS — H5202 Hypermetropia, left eye: Secondary | ICD-10-CM | POA: Diagnosis not present

## 2019-10-22 DIAGNOSIS — H349 Unspecified retinal vascular occlusion: Secondary | ICD-10-CM | POA: Diagnosis not present

## 2019-10-22 DIAGNOSIS — H26492 Other secondary cataract, left eye: Secondary | ICD-10-CM | POA: Diagnosis not present

## 2019-10-22 DIAGNOSIS — Z961 Presence of intraocular lens: Secondary | ICD-10-CM | POA: Diagnosis not present

## 2019-11-01 DIAGNOSIS — D414 Neoplasm of uncertain behavior of bladder: Secondary | ICD-10-CM | POA: Diagnosis not present

## 2019-11-09 DIAGNOSIS — F418 Other specified anxiety disorders: Secondary | ICD-10-CM | POA: Diagnosis not present

## 2019-11-09 DIAGNOSIS — D494 Neoplasm of unspecified behavior of bladder: Secondary | ICD-10-CM | POA: Diagnosis not present

## 2019-11-09 DIAGNOSIS — R630 Anorexia: Secondary | ICD-10-CM | POA: Diagnosis not present

## 2019-11-09 DIAGNOSIS — K5900 Constipation, unspecified: Secondary | ICD-10-CM | POA: Diagnosis not present

## 2019-11-09 DIAGNOSIS — G4700 Insomnia, unspecified: Secondary | ICD-10-CM | POA: Diagnosis not present

## 2019-11-09 DIAGNOSIS — F329 Major depressive disorder, single episode, unspecified: Secondary | ICD-10-CM | POA: Diagnosis not present

## 2019-11-10 DIAGNOSIS — N200 Calculus of kidney: Secondary | ICD-10-CM | POA: Diagnosis not present

## 2019-11-10 DIAGNOSIS — D414 Neoplasm of uncertain behavior of bladder: Secondary | ICD-10-CM | POA: Diagnosis not present

## 2019-11-10 DIAGNOSIS — C679 Malignant neoplasm of bladder, unspecified: Secondary | ICD-10-CM | POA: Diagnosis not present

## 2019-11-16 DIAGNOSIS — E860 Dehydration: Secondary | ICD-10-CM | POA: Diagnosis not present

## 2019-11-16 DIAGNOSIS — I959 Hypotension, unspecified: Secondary | ICD-10-CM | POA: Diagnosis not present

## 2019-11-16 DIAGNOSIS — R42 Dizziness and giddiness: Secondary | ICD-10-CM | POA: Diagnosis not present

## 2019-11-16 DIAGNOSIS — R457 State of emotional shock and stress, unspecified: Secondary | ICD-10-CM | POA: Diagnosis not present

## 2019-11-19 DIAGNOSIS — D649 Anemia, unspecified: Secondary | ICD-10-CM | POA: Diagnosis not present

## 2019-11-19 DIAGNOSIS — R0989 Other specified symptoms and signs involving the circulatory and respiratory systems: Secondary | ICD-10-CM | POA: Diagnosis not present

## 2019-11-19 DIAGNOSIS — R002 Palpitations: Secondary | ICD-10-CM | POA: Diagnosis not present

## 2019-11-19 DIAGNOSIS — F329 Major depressive disorder, single episode, unspecified: Secondary | ICD-10-CM | POA: Diagnosis not present

## 2019-11-19 DIAGNOSIS — F418 Other specified anxiety disorders: Secondary | ICD-10-CM | POA: Diagnosis not present

## 2019-12-08 ENCOUNTER — Emergency Department (HOSPITAL_COMMUNITY): Payer: Medicare Other

## 2019-12-08 ENCOUNTER — Encounter (HOSPITAL_COMMUNITY): Payer: Self-pay | Admitting: Emergency Medicine

## 2019-12-08 ENCOUNTER — Other Ambulatory Visit: Payer: Self-pay

## 2019-12-08 ENCOUNTER — Inpatient Hospital Stay (HOSPITAL_COMMUNITY)
Admission: EM | Admit: 2019-12-08 | Discharge: 2019-12-10 | DRG: 603 | Disposition: A | Discharge status: Home / Self Care | Payer: Medicare Other | Attending: Internal Medicine | Admitting: Internal Medicine

## 2019-12-08 DIAGNOSIS — Z833 Family history of diabetes mellitus: Secondary | ICD-10-CM

## 2019-12-08 DIAGNOSIS — M7989 Other specified soft tissue disorders: Secondary | ICD-10-CM | POA: Diagnosis not present

## 2019-12-08 DIAGNOSIS — E872 Acidosis: Secondary | ICD-10-CM | POA: Diagnosis present

## 2019-12-08 DIAGNOSIS — E039 Hypothyroidism, unspecified: Secondary | ICD-10-CM | POA: Diagnosis not present

## 2019-12-08 DIAGNOSIS — E611 Iron deficiency: Secondary | ICD-10-CM | POA: Diagnosis present

## 2019-12-08 DIAGNOSIS — Z90710 Acquired absence of both cervix and uterus: Secondary | ICD-10-CM

## 2019-12-08 DIAGNOSIS — M810 Age-related osteoporosis without current pathological fracture: Secondary | ICD-10-CM | POA: Diagnosis present

## 2019-12-08 DIAGNOSIS — Z9049 Acquired absence of other specified parts of digestive tract: Secondary | ICD-10-CM

## 2019-12-08 DIAGNOSIS — F329 Major depressive disorder, single episode, unspecified: Secondary | ICD-10-CM | POA: Diagnosis not present

## 2019-12-08 DIAGNOSIS — Z20822 Contact with and (suspected) exposure to covid-19: Secondary | ICD-10-CM | POA: Diagnosis not present

## 2019-12-08 DIAGNOSIS — W57XXXA Bitten or stung by nonvenomous insect and other nonvenomous arthropods, initial encounter: Secondary | ICD-10-CM | POA: Diagnosis present

## 2019-12-08 DIAGNOSIS — K5909 Other constipation: Secondary | ICD-10-CM | POA: Diagnosis present

## 2019-12-08 DIAGNOSIS — Z79899 Other long term (current) drug therapy: Secondary | ICD-10-CM

## 2019-12-08 DIAGNOSIS — Z79890 Hormone replacement therapy (postmenopausal): Secondary | ICD-10-CM

## 2019-12-08 DIAGNOSIS — L03115 Cellulitis of right lower limb: Principal | ICD-10-CM | POA: Diagnosis present

## 2019-12-08 DIAGNOSIS — D61818 Other pancytopenia: Secondary | ICD-10-CM | POA: Diagnosis not present

## 2019-12-08 DIAGNOSIS — Z820 Family history of epilepsy and other diseases of the nervous system: Secondary | ICD-10-CM

## 2019-12-08 DIAGNOSIS — Z8249 Family history of ischemic heart disease and other diseases of the circulatory system: Secondary | ICD-10-CM

## 2019-12-08 DIAGNOSIS — Z800 Family history of malignant neoplasm of digestive organs: Secondary | ICD-10-CM

## 2019-12-08 LAB — CBC WITH DIFFERENTIAL/PLATELET
Abs Immature Granulocytes: 0.02 10*3/uL (ref 0.00–0.07)
Basophils Absolute: 0 10*3/uL (ref 0.0–0.1)
Basophils Relative: 0 %
Eosinophils Absolute: 0 10*3/uL (ref 0.0–0.5)
Eosinophils Relative: 0 %
HCT: 26.5 % — ABNORMAL LOW (ref 36.0–46.0)
Hemoglobin: 8.6 g/dL — ABNORMAL LOW (ref 12.0–15.0)
Immature Granulocytes: 1 %
Lymphocytes Relative: 25 %
Lymphs Abs: 0.6 10*3/uL — ABNORMAL LOW (ref 0.7–4.0)
MCH: 34.7 pg — ABNORMAL HIGH (ref 26.0–34.0)
MCHC: 32.5 g/dL (ref 30.0–36.0)
MCV: 106.9 fL — ABNORMAL HIGH (ref 80.0–100.0)
Monocytes Absolute: 1 10*3/uL (ref 0.1–1.0)
Monocytes Relative: 41 %
Neutro Abs: 0.8 10*3/uL — ABNORMAL LOW (ref 1.7–7.7)
Neutrophils Relative %: 33 %
Platelets: 81 10*3/uL — ABNORMAL LOW (ref 150–400)
RBC: 2.48 MIL/uL — ABNORMAL LOW (ref 3.87–5.11)
RDW: 14.6 % (ref 11.5–15.5)
WBC: 2.4 10*3/uL — ABNORMAL LOW (ref 4.0–10.5)
nRBC: 0 % (ref 0.0–0.2)

## 2019-12-08 LAB — BASIC METABOLIC PANEL
Anion gap: 9 (ref 5–15)
BUN: 17 mg/dL (ref 8–23)
CO2: 23 mmol/L (ref 22–32)
Calcium: 8.7 mg/dL — ABNORMAL LOW (ref 8.9–10.3)
Chloride: 104 mmol/L (ref 98–111)
Creatinine, Ser: 1.06 mg/dL — ABNORMAL HIGH (ref 0.44–1.00)
GFR calc Af Amer: 54 mL/min — ABNORMAL LOW (ref >60)
GFR calc non Af Amer: 47 mL/min — ABNORMAL LOW (ref >60)
Glucose, Bld: 172 mg/dL — ABNORMAL HIGH (ref 70–99)
Potassium: 3.9 mmol/L (ref 3.5–5.1)
Sodium: 136 mmol/L (ref 135–145)

## 2019-12-08 LAB — IRON AND TIBC
Iron: 27 ug/dL — ABNORMAL LOW (ref 28–170)
Saturation Ratios: 10 % — ABNORMAL LOW (ref 10.4–31.8)
TIBC: 281 ug/dL (ref 250–450)
UIBC: 254 ug/dL

## 2019-12-08 LAB — PATHOLOGIST SMEAR REVIEW

## 2019-12-08 LAB — RESPIRATORY PANEL BY RT PCR (FLU A&B, COVID)
Influenza A by PCR: NEGATIVE
Influenza B by PCR: NEGATIVE
SARS Coronavirus 2 by RT PCR: NEGATIVE

## 2019-12-08 LAB — LACTIC ACID, PLASMA
Lactic Acid, Venous: 2.2 mmol/L (ref 0.5–1.9)
Lactic Acid, Venous: 1.3 mmol/L (ref 0.5–1.9)

## 2019-12-08 LAB — FERRITIN: Ferritin: 167 ng/mL (ref 11–307)

## 2019-12-08 LAB — RETICULOCYTES
Immature Retic Fract: 16.9 % — ABNORMAL HIGH (ref 2.3–15.9)
RBC.: 2.52 MIL/uL — ABNORMAL LOW (ref 3.87–5.11)
Retic Count, Absolute: 69.6 10*3/uL (ref 19.0–186.0)
Retic Ct Pct: 2.8 % (ref 0.4–3.1)

## 2019-12-08 LAB — FOLATE: Folate: 52.3 ng/mL (ref >5.9)

## 2019-12-08 LAB — SEDIMENTATION RATE: Sed Rate: 60 mm/hr — ABNORMAL HIGH (ref 0–22)

## 2019-12-08 LAB — C-REACTIVE PROTEIN: CRP: 0.8 mg/dL (ref <1.0)

## 2019-12-08 LAB — VITAMIN B12: Vitamin B-12: 931 pg/mL — ABNORMAL HIGH (ref 180–914)

## 2019-12-08 MED ORDER — SODIUM CHLORIDE 0.9 % IV BOLUS
1000.0000 mL | Freq: Once | INTRAVENOUS | Status: AC
  Administered 2019-12-08: 11:00:00 1000 mL via INTRAVENOUS

## 2019-12-08 MED ORDER — ACETAMINOPHEN 325 MG PO TABS: 650.0000 mg | ORAL_TABLET | Freq: Four times a day (QID) | ORAL | Status: DC | PRN

## 2019-12-08 MED ORDER — MELATONIN 3 MG PO TABS
3.0000 mg | ORAL_TABLET | Freq: Every day | ORAL | Status: DC
  Administered 2019-12-08 – 2019-12-09 (×2): 3 mg via ORAL
  Filled 2019-12-08 (×2): qty 1

## 2019-12-08 MED ORDER — PANTOPRAZOLE SODIUM 40 MG PO TBEC
40.0000 mg | DELAYED_RELEASE_TABLET | Freq: Every day | ORAL | Status: DC
  Administered 2019-12-09 – 2019-12-10 (×2): 40 mg via ORAL
  Filled 2019-12-08 (×2): qty 1

## 2019-12-08 MED ORDER — SENNA 8.6 MG PO TABS
1.0000 | ORAL_TABLET | Freq: Every day | ORAL | Status: DC
  Administered 2019-12-08 – 2019-12-09 (×2): 8.6 mg via ORAL
  Filled 2019-12-08 (×2): qty 1

## 2019-12-08 MED ORDER — SERTRALINE HCL 50 MG PO TABS
50.0000 mg | ORAL_TABLET | Freq: Every day | ORAL | Status: DC
  Administered 2019-12-08 – 2019-12-10 (×3): 50 mg via ORAL
  Filled 2019-12-08 (×3): qty 1

## 2019-12-08 MED ORDER — CEFAZOLIN SODIUM-DEXTROSE 1-4 GM/50ML-% IV SOLN
1.0000 g | Freq: Once | INTRAVENOUS | Status: AC
  Administered 2019-12-08: 11:00:00 1 g via INTRAVENOUS
  Filled 2019-12-08: qty 50

## 2019-12-08 MED ORDER — LEVOTHYROXINE SODIUM 125 MCG PO TABS
125.0000 ug | ORAL_TABLET | Freq: Every day | ORAL | Status: DC
  Administered 2019-12-09 – 2019-12-10 (×2): 125 ug via ORAL
  Filled 2019-12-08 (×3): qty 1

## 2019-12-08 MED ORDER — SODIUM CHLORIDE 0.9% FLUSH
3.0000 mL | Freq: Two times a day (BID) | INTRAVENOUS | Status: DC
  Administered 2019-12-08 – 2019-12-09 (×2): 3 mL via INTRAVENOUS

## 2019-12-08 MED ORDER — FLUTICASONE PROPIONATE 50 MCG/ACT NA SUSP: 1.0000 | Freq: Every day | NASAL | Status: DC | PRN

## 2019-12-08 MED ORDER — CEFAZOLIN SODIUM-DEXTROSE 1-4 GM/50ML-% IV SOLN
1.0000 g | Freq: Three times a day (TID) | INTRAVENOUS | Status: DC
  Administered 2019-12-08 – 2019-12-10 (×6): 1 g via INTRAVENOUS
  Filled 2019-12-08 (×6): qty 50

## 2019-12-08 MED ORDER — POLYETHYLENE GLYCOL 3350 17 G PO PACK
17.0000 g | PACK | Freq: Every day | ORAL | Status: DC | PRN
  Administered 2019-12-10: 09:00:00 17 g via ORAL
  Filled 2019-12-08: qty 1

## 2019-12-08 MED ORDER — ADULT MULTIVITAMIN W/MINERALS CH
1.0000 | ORAL_TABLET | Freq: Every day | ORAL | Status: DC
  Administered 2019-12-08 – 2019-12-10 (×3): 1 via ORAL
  Filled 2019-12-08 (×3): qty 1

## 2019-12-08 MED ORDER — VITAMIN D 25 MCG (1000 UNIT) PO TABS
1000.0000 [IU] | ORAL_TABLET | Freq: Every day | ORAL | Status: DC
  Administered 2019-12-08 – 2019-12-10 (×3): 1000 [IU] via ORAL
  Filled 2019-12-08 (×3): qty 1

## 2019-12-08 MED ORDER — ACETAMINOPHEN 650 MG RE SUPP: 650.0000 mg | Freq: Four times a day (QID) | RECTAL | Status: DC | PRN

## 2019-12-08 NOTE — ED Notes (Signed)
Date and time results received: 12/08/19 10:29 AM  Test: : Lactic Critical Value: 2.2  Name of Provider Notified: Melina Copa  Orders Received? Or Actions Taken?: awaiting further orders

## 2019-12-08 NOTE — H&P (Signed)
History and Physical    Alison Bridges [id number] DOB: 08-Feb-1929 DOA: 12/08/2019  PCP: Burnard Bunting, MD   I have briefly reviewed patients previous medical reports in [facility].  Patient coming from: Home  Chief Complaint: Right ankle/foot swelling, redness and discomfort  HPI: Alison Bridges is a 86 year old female, lives alone, uses walker only at night to get to bathroom from her bed but otherwise independent in the daytime, PMH of hypothyroidism, depression, osteoporosis, presented to the [facility] ED on 12/08/2019 due to swelling, redness and discomfort of her right foot and ankle.  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  Around midday she noticed swelling of her right foot and ankle with some redness in her shoe was feeling tight which she took off.  She feels that she may have been bitten by some insect but did not actually witness any insect bite.  Over the course of the night, she noted progressive swelling and redness moving proximally to the right lower leg.  She did not really have much pain but the foot and ankle felt tight and in discomfort.  Due to worsening symptoms, she decided to drive herself to the emergency room.  Denies fever or chills.  Denies direct trauma to the foot.  ED Course: Afebrile, stable vital signs, lab work significant for WBC 2.4, ANC 0.8, hemoglobin 8.6, platelets 81, BMP unremarkable except for random glucose of 172, x-ray of the right ankle show soft tissue swelling without acute osseous findings.  She has received a dose of IV cefazolin.  She did report some difficulty ambulating due to the right lower extremity discomfort.  Review of Systems:  All other systems reviewed and apart from HPI, are negative.  Past Medical History:  Diagnosis Date  . Depression   . Hypothyroidism   . Osteoporosis     Past Surgical History:  Procedure Laterality Date  . ABDOMINAL HYSTERECTOMY    . APPENDECTOMY  1998  .  CHOLECYSTECTOMY  1998    Social History  reports that she has never smoked. She has never used smokeless tobacco. She reports that she does not drink alcohol or use drugs.  No Known Allergies  Family History  Problem Relation Age of Onset  . Heart attack Mother 73  . Pancreatic cancer Father   . Parkinson's disease Sister   . Diabetes Brother        Multiple complications  . Heart disease Brother        Not sure what happened  . Dementia Brother      Prior to Admission medications   Medication Sig Start Date End Date Taking? Authorizing Provider  cholecalciferol (VITAMIN D) 1000 UNITS tablet Take 1,000 Units by mouth daily.   Yes [provider]  fluticasone (FLONASE) 50 MCG/ACT nasal spray Place 1-2 sprays into both nostrils daily as needed for allergies or rhinitis.   Yes [provider]  hydrocortisone cream 1 % Apply 1 application topically 2 (two) times daily as needed (swollen/red foot).   Yes [provider]  ibuprofen (ADVIL) 200 MG tablet Take 200 mg by mouth every 6 (six) hours as needed for headache or moderate pain.   Yes [provider]  levothyroxine (SYNTHROID, LEVOTHROID) 125 MCG tablet Take 125 mcg by mouth daily before breakfast.   Yes [provider]  loratadine (CLARITIN) 10 MG tablet Take 10 mg by mouth daily.   Yes [provider]  MELATONIN PO  Take 1 tablet by mouth at bedtime.   Yes [provider]  Multiple Vitamin (MULTIVITAMIN WITH MINERALS) TABS tablet Take 1 tablet by mouth daily.   Yes [provider]  Multiple Vitamins-Minerals (ZINC PO) Take 1 tablet by mouth daily.   Yes [provider]  pantoprazole (PROTONIX) 40 MG tablet Take 40 mg by mouth daily.   Yes [provider]  polyethylene glycol (MIRALAX) 17 g packet Take 17 g by mouth daily as needed for mild constipation.   Yes [provider]  sertraline (ZOLOFT) 50 MG tablet Take 50 mg by mouth daily.    Yes [provider]    Physical Exam: Vitals:   12/08/19 0905 12/08/19 1130 12/08/19 1230 12/08/19 1400  BP: 125/66 (!) 136/57 (!) 150/73 140/61  Pulse: 100 71 72 72  Resp: 16 18 18 16   Temp: 98.1 F (36.7 C)     TempSrc: Oral     SpO2: 99% 100% 100% 97%      Constitutional: Pleasant elderly female, moderately built and nourished lying comfortably propped up in bed without distress. Eyes: PERTLA, lids and conjunctivae normal ENMT: Mucous membranes are moist. Posterior pharynx clear of any exudate or lesions. Normal dentition.  Neck: supple, no masses, no thyromegaly.  Approximately 1.5 cm diameter nontender, suspected lymph node appreciated in the left mid submandibular region without acute findings. Respiratory: Clear to auscultation without wheezing, rhonchi or crackles. No increased work of breathing. Cardiovascular: S1 & S2 heard, regular rate and rhythm. No JVD, murmurs, rubs or clicks. No pedal edema. Abdomen: Non distended. Non tender. Soft. No organomegaly or masses appreciated. No clinical Ascites. Normal bowel sounds heard. Musculoskeletal: no clubbing / cyanosis. No joint deformity upper and lower extremities. Good ROM, no contractures. Normal muscle tone.  Skin: Right foot/ankle with moderate swelling compared to the left, mild erythema, increased warmth but no tenderness.  No obvious insect bite marks.  No crepitus or fluctuance.  No painful range of ankle movement. Neurologic: CN 2-12 grossly intact. Sensation intact, DTR normal. Strength 5/5 in all 4 limbs.  Psychiatric: Normal judgment and insight. Alert and oriented x 3. Normal mood.     Labs on Admission: I have personally reviewed following labs and imaging studies  CBC: Recent Labs  Lab 12/08/19 0934  WBC 2.4*  NEUTROABS 0.8*  HGB 8.6*  HCT 26.5*  MCV 106.9*  PLT 81*    Basic Metabolic Panel: Recent Labs  Lab 12/08/19 0934  NA 136  K 3.9  CL 104  CO2 23  GLUCOSE 172*  BUN 17    CREATININE 1.06*  CALCIUM 8.7*    Radiological Exams on Admission: DG Ankle Complete Right  Result Date: 12/08/2019 CLINICAL DATA:  Ankle  swelling.  Possible insect bite EXAM: RIGHT ANKLE - COMPLETE 3+ VIEW COMPARISON:  None. FINDINGS: There is no evidence of fracture, dislocation, or joint effusion. Osteopenia. Nonspecific soft tissue swelling about the ankle IMPRESSION: Soft tissue swelling without acute osseous finding. Electronically Signed   By: Monte Fantasia M.D.   On: 12/08/2019 10:28      Assessment/Plan Principal Problem:   Cellulitis of right ankle Active Problems:   Pancytopenia ([place])     Acute right foot/ankle cellulitis: Although patient feels that she may have been bitten by an insect, no confirmation about the same and unable to see any obvious insect bite marks.  Given that her symptoms have progressed to an extent where she is unable to wear her shoes and having some  discomfort in ambulating, will observe overnight and treat for moderate cellulitis.  Cellulitis order set utilized and continue IV cefazolin per pharmacy.  Elevate right lower extremity.  PT evaluation requested.  Pancytopenia: Unclear etiology.  Prior labs in October 2020 showed only mild anemia with hemoglobin of 11.4 and macrocytosis.  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  Given her pancytopenia/neutropenia/macrocytosis, requested anemia panel.  Follow CBC in a.m.  Peripheral smear confirms pancytopenia.  Recommend outpatient hematology consultation.  No bleeding issues.  Constipation: Appears to be a chronic issue.  Also had some decreased appetite ongoing for several months for which she has seen her PCP.  Continue MiraLAX and senna.  Hypothyroid: Continue Synthroid.  Clinically appears euthyroid.  Depression: Continue home regimen.  Appears  stable.   DVT prophylaxis: SCDs Code Status: Full, confirmed with patient Family Communication: None at bedside Disposition Plan:   Patient is from:  Home  Anticipated DC to:  Home  Anticipated DC date:  12/09/2019  Anticipated DC barriers: Improvement of right lower extremity cellulitis and PT evaluation   Consults called: None but EDP discussed with hematology. Admission status: Observation, medical bed.  Severity of Illness: The appropriate patient status for this patient is OBSERVATION. Observation status is judged to be reasonable and necessary in order to provide the required intensity of service to ensure the patient's safety. The patient's presenting symptoms, physical exam findings, and initial radiographic and laboratory data in the context of their medical condition is felt to place them at decreased risk for further clinical deterioration. Furthermore, it is anticipated that the patient will be medically stable for discharge from the hospital within 2 midnights of admission. The following factors support the patient status of observation.   " The patient's presenting symptoms include redness, swelling and discomfort of right foot and ankle. " The physical exam findings include right foot, ankle with moderate swelling, mild erythema and increased warmth. " The initial radiographic and laboratory data are pancytopenia.      Vernell Leep MD [facility]  To contact the attending provider between 7A-7P or the covering provider during after hours 7P-7A, please log into the web site [email or web address] and access using universal [place] password for that web site. If you do not have the password, please call the hospital operator.  12/08/2019, 3:33 PM

## 2019-12-08 NOTE — ED Triage Notes (Signed)
Per pt, states she thinks she was bit by a spider yesterday-states she was working in her yard-didn't feel bit but noticed right ankle swelling and warm to touch-denies pain

## 2019-12-08 NOTE — ED Provider Notes (Signed)
[facility] DEPT Provider Note   CSN: [id number] Arrival date & time: 12/08/19  0900     History Chief Complaint  Patient presents with  . Ankle Pain  . Insect Bite    Alison Bridges is a 86 y.o. female.  She is complaining of right ankle pain and swelling that started yesterday.  She was working in the yard and wonders if she was bit by a spider.  She felt a little feverish last night.  She is able to ambulate on it but has some discomfort and is using a cane.  No trauma.  No nausea or vomiting.  The history is provided by the patient.  Ankle Pain Location:  Ankle Time since incident:  24 hours Injury: no   Ankle location:  R ankle Pain details:    Quality:  Throbbing   Radiates to:  Does not radiate   Severity:  Moderate   Onset quality:  Gradual   Timing:  Constant   Progression:  Worsening Chronicity:  New Dislocation: no   Foreign body present:  No foreign bodies Relieved by:  Nothing Worsened by:  Bearing weight Ineffective treatments:  None tried Associated symptoms: fever (?) and swelling   Associated symptoms: no back pain and no numbness        Past Medical History:  Diagnosis Date  . Depression   . Hypothyroidism   . Osteoporosis     Patient Active Problem List   Diagnosis Date Noted  . Palpitations 11/25/2017    Past Surgical History:  Procedure Laterality Date  . ABDOMINAL HYSTERECTOMY    . APPENDECTOMY  1998  . CHOLECYSTECTOMY  1998     OB History   No obstetric history on file.     Family History  Problem Relation Age of Onset  . Heart attack Mother 73  . Pancreatic cancer Father   . Parkinson's disease Sister   . Diabetes Brother        Multiple complications  . Heart disease Brother        Not sure what happened  . Dementia Brother     Social History   Tobacco Use  . Smoking status: Never Smoker  . Smokeless tobacco: Never Used  Substance Use Topics  . Alcohol use: No  . Drug use: No     Home Medications Prior to Admission medications   Medication Sig Start Date End Date Taking? Authorizing Provider  cholecalciferol (VITAMIN D) 1000 UNITS tablet Take 1,000 Units by mouth daily.    [provider]  levothyroxine (SYNTHROID, LEVOTHROID) 125 MCG tablet Take 125 mcg by mouth daily before breakfast.    [provider]  loratadine (CLARITIN) 10 MG tablet Take 10 mg by mouth daily.    [provider]  pantoprazole (PROTONIX) 40 MG tablet Take 40 mg by mouth daily.    [provider]  polyethylene glycol (MIRALAX) 17 g packet Take 17 g by mouth daily as needed for mild constipation.    [provider]  sertraline (ZOLOFT) 50 MG tablet Take 50 mg by mouth daily.    [provider]    Allergies    Patient has no known allergies.  Review of Systems   Review of Systems  Constitutional: Positive for fever (?).  HENT: Negative for sore throat.   Eyes: Negative for visual disturbance.  Respiratory: Negative for shortness of breath.   Cardiovascular: Negative for chest pain.  Gastrointestinal: Negative for abdominal pain.  Genitourinary:  Negative for dysuria.  Musculoskeletal: Negative for back pain.  Skin: Positive for wound.  Neurological: Negative for headaches.    Physical Exam Updated Vital Signs BP 125/66 (BP Location: Left Arm)   Pulse 100   Temp 98.1 F (36.7 C) (Oral)   Resp 16   SpO2 99%   Physical Exam Vitals and nursing note reviewed.  Constitutional:      General: She is not in acute distress.    Appearance: She is well-developed.  HENT:     Head: Normocephalic and atraumatic.  Eyes:     Conjunctiva/sclera: Conjunctivae normal.  Cardiovascular:     Rate and Rhythm: Normal rate and regular rhythm.     Heart sounds: No murmur.  Pulmonary:     Effort: Pulmonary effort is normal. No respiratory distress.     Breath sounds: Normal breath sounds.  Abdominal:     Palpations: Abdomen is soft.      Tenderness: There is no abdominal tenderness.  Musculoskeletal:        General: Swelling and tenderness present.     Cervical back: Neck supple.     Comments: Patient has moderate swelling around the right ankle more lateral than medial.  There is a little bit of erythema and some warmth to it.  I am able to range of motion it.  Distal pulses cap refill sensation and motor intact.  Other joints unaffected  Skin:    General: Skin is warm and dry.     Capillary Refill: Capillary refill takes less than 2 seconds.  Neurological:     General: No focal deficit present.     Mental Status: She is alert.     ED Results / Procedures / Treatments   Labs (all labs ordered are listed, but only abnormal results are displayed) Labs Reviewed  CBC WITH DIFFERENTIAL/PLATELET - Abnormal; Notable for the following components:      Result Value   WBC 2.4 (*)    RBC 2.48 (*)    Hemoglobin 8.6 (*)    HCT 26.5 (*)    MCV 106.9 (*)    MCH 34.7 (*)    Platelets 81 (*)    Neutro Abs 0.8 (*)    Lymphs Abs 0.6 (*)    All other components within normal limits  BASIC METABOLIC PANEL - Abnormal; Notable for the following components:   Glucose, Bld 172 (*)    Creatinine, Ser 1.06 (*)    Calcium 8.7 (*)    GFR calc non Af Amer 47 (*)    GFR calc Af Amer 54 (*)    All other components within normal limits  LACTIC ACID, PLASMA - Abnormal; Notable for the following components:   Lactic Acid, Venous 2.2 (*)    All other components within normal limits  SEDIMENTATION RATE - Abnormal; Notable for the following components:   Sed Rate 60 (*)    All other components within normal limits  VITAMIN B12 - Abnormal; Notable for the following components:   Vitamin B-12 931 (*)    All other components within normal limits  IRON AND TIBC - Abnormal; Notable for the following components:   Iron 27 (*)    Saturation Ratios 10 (*)    All other components within normal limits  RETICULOCYTES - Abnormal; Notable for the  following components:   RBC. 2.52 (*)    Immature Retic Fract 16.9 (*)    All other components within normal limits  RESPIRATORY PANEL BY RT PCR (FLU  A&B, COVID)  CULTURE, BLOOD (ROUTINE X 2)  CULTURE, BLOOD (ROUTINE X 2)  LACTIC ACID, PLASMA  C-REACTIVE PROTEIN  PATHOLOGIST SMEAR REVIEW  FOLATE  FERRITIN  CBC WITH DIFFERENTIAL/PLATELET    EKG None  Radiology DG Ankle Complete Right  Result Date: 12/08/2019 CLINICAL DATA:  Ankle  swelling.  Possible insect bite EXAM: RIGHT ANKLE - COMPLETE 3+ VIEW COMPARISON:  None. FINDINGS: There is no evidence of fracture, dislocation, or joint effusion. Osteopenia. Nonspecific soft tissue swelling about the ankle IMPRESSION: Soft tissue swelling without acute osseous finding. Electronically Signed   By: Monte Fantasia M.D.   On: 12/08/2019 10:28    Procedures Procedures (including critical care time)  Medications Ordered in ED Medications  sertraline (ZOLOFT) tablet 50 mg (50 mg Oral Given 12/08/19 1630)  levothyroxine (SYNTHROID) tablet 125 mcg (has no administration in time range)  pantoprazole (PROTONIX) EC tablet 40 mg (has no administration in time range)  polyethylene glycol (MIRALAX / GLYCOLAX) packet 17 g (has no administration in time range)  melatonin tablet 3 mg (has no administration in time range)  cholecalciferol (VITAMIN D3) tablet 1,000 Units (1,000 Units Oral Given 12/08/19 1631)  multivitamin with minerals tablet 1 tablet (1 tablet Oral Given 12/08/19 1631)  fluticasone (FLONASE) 50 MCG/ACT nasal spray 1-2 spray (has no administration in time range)  sodium chloride flush (NS) 0.9 % injection 3 mL (has no administration in time range)  ceFAZolin (ANCEF) IVPB 1 g/50 mL premix (1 g Intravenous New Bag/Given (Non-Interop) 12/08/19 1827)  acetaminophen (TYLENOL) tablet 650 mg (has no administration in time range)    Or  acetaminophen (TYLENOL) suppository 650 mg (has no administration in time range)  senna (SENOKOT) tablet 8.6 mg  (has no administration in time range)  sodium chloride 0.9 % bolus 1,000 mL (0 mLs Intravenous Stopped 12/08/19 1137)  ceFAZolin (ANCEF) IVPB 1 g/50 mL premix (0 g Intravenous Stopped 12/08/19 1118)    ED Course  I have reviewed the triage vital signs and the nursing notes.  Pertinent labs & imaging results that were available during my care of the patient were reviewed by me and considered in my medical decision making (see chart for details).  Clinical Course as of Dec 07 1936  Wed Dec 08, 2019  1008 Patient's right ankle x-ray interpreted by me as no gross fractures or dislocations.   [MB]  [id number] Discussed with Dr. Alen Blew from oncology.  He feels the drop is likely due to her infectious process although we cannot exclude that she has an underlying MDS on top of it. Recommends treat the infection and see if counts improve   [MB]  1243 Discussed with Dr. Algis Liming [facility] hospitalist who will evaluate the patient for admission.   [MB]    Clinical Course User Index [MB] Hayden Rasmussen, MD   MDM Rules/Calculators/A&P                     This patient complains of right ankle pain and swelling; this involves an extensive number of treatment Options and is a complaint that carries with it a high risk of complications and Morbidity. The differential includes cellulitis, gout, septic joint  I ordered, reviewed and interpreted labs, which included CBC with all 3 cell lines down from prior.  Lactic acid elevated but repeat improved with fluids.  Chemistry showing elevated glucose elevated creatinine.  Sed rate elevated at 60. I ordered medication IV fluids and cefazolin I ordered imaging studies which included x-ray  right ankle and I independently    visualized and interpreted imaging which showed no gross fractures or dislocations Previous records obtained and reviewed in epic including last blood counts which showed normal platelets and mild anemia I consulted Dr. Alen Blew from oncology along  with [facility] hospitalist Dr. Algis Liming and discussed lab and imaging findings  After the interventions stated above, I reevaluated the patient and found patient's pain to be improved and hemodynamics improved.  I think she needs to stay in the hospital for IV antibiotics and to make sure that this process improves.  Oncology feels her pancytopenia may be reactive to infection and recommends follow-up.  Patient agreeable to admission.   Final Clinical Impression(s) / ED Diagnoses Final diagnoses:  Cellulitis of right ankle  Pancytopenia [facility])    Rx / DC Orders ED Discharge Orders    None       Hayden Rasmussen, MD 12/08/19 1943

## 2019-12-08 NOTE — ED Notes (Signed)
ED TO INPATIENT HANDOFF REPORT  Name/Age/Gender Alison Bridges 86 y.o. female  Code Status    Code Status Orders  (From admission, onward)         Start     Ordered   12/08/19 1618  Full code  Continuous     12/08/19 1617        Code Status History    This patient has a current code status but no historical code status.   Advance Care Planning Activity    Advance Directive Documentation     Most Recent Value  Type of Advance Directive  Healthcare Power of Attorney, Living will  Pre-existing out of facility DNR order (yellow form or pink MOST form)  --  "MOST" Form in Place?  --      Home/SNF/Other Home  Chief Complaint Cellulitis of right ankle [id number]  Level of Care/Admitting Diagnosis ED Disposition    ED Disposition Condition [place]: [place] [[id number]]  Level of Care: Med-Surg [16]  Covid Evaluation: Asymptomatic Screening Protocol (No Symptoms)  Diagnosis: Cellulitis of right ankle [id number]  Admitting Physician: [name] [place]  Attending Physician: Vernell Leep D [[id number]]       Medical History Past Medical History:  Diagnosis Date  . Depression   . Hypothyroidism   . Osteoporosis     Allergies No Known Allergies  IV Location/Drains/Wounds Patient Lines/Drains/Airways Status   Active Line/Drains/Airways    Name:   Placement date:   Placement time:   Site:   Days:   Peripheral IV 12/08/19 Right Wrist   12/08/19    0950    Wrist   less than 1          Labs/Imaging Results for orders placed or performed during the hospital encounter of 12/08/19 (from the past 48 hour(s))  CBC with Differential     Status: Abnormal   Collection Time: 12/08/19  9:34 AM  Result Value Ref Range   WBC 2.4 (L) 4.0 - 10.5 K/uL   RBC 2.48 (L) 3.87 - 5.11 MIL/uL   Hemoglobin 8.6 (L) 12.0 - 15.0 g/dL   HCT 26.5 (L) 36.0 - 46.0 %   MCV 106.9 (H) 80.0 - 100.0 fL   MCH 34.7 (H) 26.0 - 34.0 pg   MCHC 32.5  30.0 - 36.0 g/dL   RDW 14.6 11.5 - 15.5 %   Platelets 81 (L) 150 - 400 K/uL    Comment: SPECIMEN CHECKED FOR CLOTS Immature Platelet Fraction may be clinically indicated, consider ordering this additional test [id number] PLATELET COUNT CONFIRMED BY SMEAR REPEATED TO VERIFY    nRBC 0.0 0.0 - 0.2 %   Neutrophils Relative % 33 %   Neutro Abs 0.8 (L) 1.7 - 7.7 K/uL   Lymphocytes Relative 25 %   Lymphs Abs 0.6 (L) 0.7 - 4.0 K/uL   Monocytes Relative 41 %   Monocytes Absolute 1.0 0.1 - 1.0 K/uL   Eosinophils Relative 0 %   Eosinophils Absolute 0.0 0.0 - 0.5 K/uL   Basophils Relative 0 %   Basophils Absolute 0.0 0.0 - 0.1 K/uL   Immature Granulocytes 1 %   Abs Immature Granulocytes 0.02 0.00 - 0.07 K/uL   Polychromasia PRESENT     Comment: Performed at [facility], [place] [address], [place], [place] [id number]  Basic metabolic panel     Status: Abnormal   Collection Time: 12/08/19  9:34 AM  Result Value Ref Range  Sodium 136 135 - 145 mmol/L   Potassium 3.9 3.5 - 5.1 mmol/L   Chloride 104 98 - 111 mmol/L   CO2 23 22 - 32 mmol/L   Glucose, Bld 172 (H) 70 - 99 mg/dL    Comment: Glucose reference range applies only to samples taken after fasting for at least 8 hours.   BUN 17 8 - 23 mg/dL   Creatinine, Ser 1.06 (H) 0.44 - 1.00 mg/dL   Calcium 8.7 (L) 8.9 - 10.3 mg/dL   GFR calc non Af Amer 47 (L) >60 mL/min   GFR calc Af Amer 54 (L) >60 mL/min   Anion gap 9 5 - 15    Comment: Performed at [facility], [place] [address] [place], [place] [address]  Lactic acid, plasma     Status: Abnormal   Collection Time: 12/08/19  9:34 AM  Result Value Ref Range   Lactic Acid, Venous 2.2 (HH) 0.5 - 1.9 mmol/L    Comment: CRITICAL RESULT CALLED TO, READ BACK BY AND VERIFIED WITH: LEONARD,S. RN AT 1028 12/08/19 MULLINS,T Performed at [facility], [place] [address], [place], [place] [address]   Sedimentation rate     Status: Abnormal    Collection Time: 12/08/19  9:34 AM  Result Value Ref Range   Sed Rate 60 (H) 0 - 22 mm/hr    Comment: Performed at [facility], [place] [address] [place], [place] [address]  Pathologist smear review     Status: None   Collection Time: 12/08/19  9:34 AM  Result Value Ref Range   Path Review Reviewed By Violet Baldy, M.D.     Comment: 5.5.2021 PANCYTOPENIA Performed at [facility], [place] [address], [place], [place] [address]   Vitamin B12     Status: Abnormal   Collection Time: 12/08/19  9:34 AM  Result Value Ref Range   Vitamin B-12 931 (H) 180 - 914 pg/mL    Comment: (NOTE) This assay is not validated for testing neonatal or myeloproliferative syndrome specimens for Vitamin B12 levels. Performed at [facility], [place] [address], [place], [place] [address]   Folate     Status: None   Collection Time: 12/08/19  9:34 AM  Result Value Ref Range   Folate 52.3 >5.9 ng/mL    Comment: RESULTS CONFIRMED BY MANUAL DILUTION Performed at [place] [address], [place], [place] [address]   Iron and TIBC     Status: Abnormal   Collection Time: 12/08/19  9:34 AM  Result Value Ref Range   Iron 27 (L) 28 - 170 ug/dL   TIBC 281 250 - 450 ug/dL   Saturation Ratios 10 (L) 10.4 - 31.8 %   UIBC 254 ug/dL    Comment: Performed at [facility], [place] [address], [place], [place] [address]  Ferritin     Status: None   Collection Time: 12/08/19  9:34 AM  Result Value Ref Range   Ferritin 167 11 - 307 ng/mL    Comment: Performed at [facility], [place] [address] [place], [place] [address]  Reticulocytes     Status: Abnormal   Collection Time: 12/08/19  9:34 AM  Result Value Ref Range   Retic Ct Pct 2.8 0.4 - 3.1 %   RBC. 2.52 (L) 3.87 - 5.11 MIL/uL   Retic Count, Absolute 69.6 19.0 - 186.0 K/uL   Immature Retic Fract 16.9 (H) 2.3 - 15.9 %  Comment: Performed at [facility], [place] [address] [place], [place] [address]  C-reactive protein     Status: None   Collection Time: 12/08/19  9:35 AM  Result Value Ref Range   CRP 0.8 <1.0 mg/dL    Comment: Performed at [facility], [place] [address], [place], [place] [address]  Lactic acid, plasma     Status: None   Collection Time: 12/08/19 11:34 AM  Result Value Ref Range   Lactic Acid, Venous 1.3 0.5 - 1.9 mmol/L    Comment: Performed at [facility], [place] [address], [place], [place] [address]  Respiratory Panel by RT PCR (Flu A&B, Covid) - Nasopharyngeal Swab     Status: None   Collection Time: 12/08/19 12:21 PM   Specimen: Nasopharyngeal Swab  Result Value Ref Range   SARS Coronavirus 2 by RT PCR NEGATIVE NEGATIVE    Comment: (NOTE) SARS-CoV-2 target nucleic acids are NOT DETECTED. The SARS-CoV-2 RNA is generally detectable in upper respiratoy specimens during the acute phase of infection. The lowest concentration of SARS-CoV-2 viral copies this assay can detect is 131 copies/mL. A negative result does not preclude SARS-Cov-2 infection and should not be used as the sole basis for treatment or other patient management decisions. A negative result may occur with  improper specimen collection/handling, submission of specimen other than nasopharyngeal swab, presence of viral mutation(s) within the areas targeted by this assay, and inadequate number of viral copies (<131 copies/mL). A negative result must be combined with clinical observations, patient history, and epidemiological information. The expected result is Negative. Fact Sheet for Patients:  PinkCheek.be Fact Sheet for Healthcare Providers:  GravelBags.it This test is not yet ap proved or cleared by the [place] FDA and  has been authorized for detection and/or diagnosis of SARS-CoV-2 by FDA under an Emergency Use Authorization (EUA).  This EUA will remain  in effect (meaning this test can be used) for the duration of the COVID-19 declaration under Section 564(b)(1) of the Act, 21 U.S.C. section 360bbb-3(b)(1), unless the authorization is terminated or revoked sooner.    Influenza A by PCR NEGATIVE NEGATIVE   Influenza B by PCR NEGATIVE NEGATIVE    Comment: (NOTE) The Xpert Xpress SARS-CoV-2/FLU/RSV assay is intended as an aid in  the diagnosis of influenza from Nasopharyngeal swab specimens and  should not be used as a sole basis for treatment. Nasal washings and  aspirates are unacceptable for Xpert Xpress SARS-CoV-2/FLU/RSV  testing. Fact Sheet for Patients: PinkCheek.be Fact Sheet for Healthcare Providers: GravelBags.it This test is not yet approved or cleared by the [place] FDA and  has been authorized for detection and/or diagnosis of SARS-CoV-2 by  FDA under an Emergency Use Authorization (EUA). This EUA will remain  in effect (meaning this test can be used) for the duration of the  Covid-19 declaration under Section 564(b)(1) of the Act, 21  U.S.C. section 360bbb-3(b)(1), unless the authorization is  terminated or revoked. Performed at [facility], [place] [address], [place], [place] [address]    DG Ankle Complete Right  Result Date: 12/08/2019 CLINICAL DATA:  Ankle  swelling.  Possible insect bite EXAM: RIGHT ANKLE - COMPLETE 3+ VIEW COMPARISON:  None. FINDINGS: There is no evidence of fracture, dislocation, or joint effusion. Osteopenia. Nonspecific soft tissue swelling about the ankle IMPRESSION: Soft tissue swelling without acute osseous finding. Electronically Signed   By: Monte Fantasia M.D.   On: 12/08/2019 10:28    Pending  Labs [employer] (From admission, onward)    Start     Ordered   12/09/19 0500  CBC WITH DIFFERENTIAL  Tomorrow morning,   R     12/08/19 1617   12/08/19 0934  Culture, blood (routine x 2)   BLOOD CULTURE X 2,   STAT     12/08/19 0934          Vitals/Pain Today's Vitals   12/08/19 1730 12/08/19 1800 12/08/19 1900 12/08/19 1930  BP: (!) 155/62 139/65 128/74 133/62  Pulse: 76 77 76 74  Resp: 18 18 16    Temp:      TempSrc:      SpO2: 96% 99% 97% 97%  Weight:      Height:      PainSc:        Isolation Precautions No active isolations  Medications Medications  sertraline (ZOLOFT) tablet 50 mg (50 mg Oral Given 12/08/19 1630)  levothyroxine (SYNTHROID) tablet 125 mcg (has no administration in time range)  pantoprazole (PROTONIX) EC tablet 40 mg (has no administration in time range)  polyethylene glycol (MIRALAX / GLYCOLAX) packet 17 g (has no administration in time range)  melatonin tablet 3 mg (has no administration in time range)  cholecalciferol (VITAMIN D3) tablet 1,000 Units (1,000 Units Oral Given 12/08/19 1631)  multivitamin with minerals tablet 1 tablet (1 tablet Oral Given 12/08/19 1631)  fluticasone (FLONASE) 50 MCG/ACT nasal spray 1-2 spray (has no administration in time range)  sodium chloride flush (NS) 0.9 % injection 3 mL (has no administration in time range)  ceFAZolin (ANCEF) IVPB 1 g/50 mL premix (0 g Intravenous Stopped 12/08/19 1943)  acetaminophen (TYLENOL) tablet 650 mg (has no administration in time range)    Or  acetaminophen (TYLENOL) suppository 650 mg (has no administration in time range)  senna (SENOKOT) tablet 8.6 mg (has no administration in time range)  sodium chloride 0.9 % bolus 1,000 mL (0 mLs Intravenous Stopped 12/08/19 1137)  ceFAZolin (ANCEF) IVPB 1 g/50 mL premix (0 g Intravenous Stopped 12/08/19 1118)    Mobility walks with device

## 2019-12-08 NOTE — Evaluation (Signed)
Physical Therapy Evaluation Patient Details Name: Alison Bridges MRN: [id number] DOB: [birth year] Today's Date: 12/08/2019   History of Present Illness   Patient is 86 y.o. female with PMH significant for osteoporosis, hypothyroidism, and deepression. She presented to the [facility] ED on 12/08/2019 due to swelling, redness and discomfort of her right foot and ankle. Pt reports she feels it may be a bug bite, she denies pain. Patient lives alone and is indepenent at baseline.    Clinical Impression  Alison Bridges is 86 y.o. female admitted with above HPI and diagnosis. Patient is currently limited by functional impairments below (see PT problem list). Patient lives alone and is independent at baseline using SBQC at night to mobilize to her bathroom. Patient will benefit from continued skilled PT interventions to address impairments and progress independence with mobility, recommending OPPT follow up to address mild balance impairments. Acute PT will follow and progress as able.     Follow Up Recommendations Outpatient PT(for balance training and general strengthening)    Equipment Recommendations  None recommended by PT    Recommendations for Other Services       Precautions / Restrictions Precautions Precautions: Fall Precaution Comments: pt denies falls in last 6 months Restrictions Weight Bearing Restrictions: No      Mobility  Bed Mobility Overal bed mobility: Needs Assistance Bed Mobility: Supine to Sit;Sit to Supine     Supine to sit: Supervision Sit to supine: Supervision   General bed mobility comments: pt able to get in/out of walker without assist, HOB slightly elevated.  Transfers Overall transfer level: Needs assistance Equipment used: None Transfers: Sit to/from Stand Sit to Stand: Supervision;From elevated surface         General transfer comment: pt steady with rising and able to perform power up without assist. pt with safe technique to reach back  and sit on EOB at EOS.  Ambulation/Gait Ambulation/Gait assistance: Min guard;Supervision Gait Distance (Feet): 130 Feet Assistive device: Rolling walker (2 wheeled) Gait Pattern/deviations: Step-through pattern;Decreased step length - right;Decreased step length - left;Decreased stride length;Shuffle Gait velocity: fair   General Gait Details: pt with short steps and low foot clearance due to decreased hip/knee flexion in swing phase. No overt LOB noted.   Stairs            Wheelchair Mobility    Modified Rankin (Stroke Patients Only)       Balance Overall balance assessment: Mild deficits observed, not formally tested        Pertinent Vitals/Pain Pain Assessment: No/denies pain    Home Living Family/patient expects to be discharged to:: Private residence Living Arrangements: Alone Available Help at Discharge: Family([place] Type of Home: House Home Access: Stairs to enter Entrance Stairs-Rails: Can reach both Entrance Stairs-Number of Steps: 2 Home Layout: One level Home Equipment: [place] - quad;Shower seat;Grab bars - tub/shower      Prior Function Level of Independence: Independent         Comments: pt uses SBQC at night to go to the bathroom, she does not use it during the day. prior to [place] she was exercising 2-3x/week at community center doing the "AHOY" classes ([employer] Health to Your Years).      Hand Dominance   Dominant Hand: Right    Extremity/Trunk Assessment   Upper Extremity Assessment Upper Extremity Assessment: Overall WFL for tasks assessed    Lower Extremity Assessment Lower Extremity Assessment: Generalized weakness  Cervical / Trunk Assessment Cervical / Trunk Assessment: Normal  Communication   Communication: No difficulties  Cognition Arousal/Alertness: Awake/alert Behavior During Therapy: WFL for tasks assessed/performed Overall Cognitive Status: Within Functional Limits for  tasks assessed         General Comments General comments (skin integrity, edema, etc.): Pt Rt ankle swollen and red.    Exercises     Assessment/Plan    PT Assessment Patient needs continued PT services  PT Problem List Decreased strength;Decreased balance;Decreased mobility       PT Treatment Interventions DME instruction;Gait training;Stair training;Functional mobility training;Therapeutic activities;Therapeutic exercise;Balance training;Patient/family education    PT Goals (Current goals can be found in the Care Plan section)  Acute Rehab PT Goals Patient Stated Goal: to get home PT Goal Formulation: With patient Time For Goal Achievement: 12/15/19 Potential to Achieve Goals: Good    Frequency Min 3X/week    AM-PAC PT "6 Clicks" Mobility  Outcome Measure Help needed turning from your back to your side while in a flat bed without using bedrails?: None Help needed moving from lying on your back to sitting on the side of a flat bed without using bedrails?: None Help needed moving to and from a bed to a chair (including a wheelchair)?: A Little Help needed standing up from a chair using your arms (e.g., wheelchair or bedside chair)?: A Little Help needed to walk in hospital room?: A Little Help needed climbing 3-5 steps with a railing? : A Little 6 Click Score: 20    End of Session Equipment Utilized During Treatment: Gait belt Activity Tolerance: Patient tolerated treatment well Patient left: in bed;with call bell/phone within reach;with family/visitor present Nurse Communication: Mobility status PT Visit Diagnosis: Unsteadiness on feet (R26.81);Difficulty in walking, not elsewhere classified (R26.2)    Time: 1643-1700 PT Time Calculation (min) (ACUTE ONLY): 17 min   Charges:   PT Evaluation $PT Eval Low Complexity: 1 Low          Verner Mould, DPT Physical Therapist with [facility] [phone]  12/08/2019 7:06 PM

## 2019-12-09 ENCOUNTER — Observation Stay (HOSPITAL_COMMUNITY): Payer: Medicare Other

## 2019-12-09 DIAGNOSIS — Z800 Family history of malignant neoplasm of digestive organs: Secondary | ICD-10-CM | POA: Diagnosis not present

## 2019-12-09 DIAGNOSIS — E039 Hypothyroidism, unspecified: Secondary | ICD-10-CM | POA: Diagnosis present

## 2019-12-09 DIAGNOSIS — Z90710 Acquired absence of both cervix and uterus: Secondary | ICD-10-CM | POA: Diagnosis not present

## 2019-12-09 DIAGNOSIS — Z79890 Hormone replacement therapy (postmenopausal): Secondary | ICD-10-CM | POA: Diagnosis not present

## 2019-12-09 DIAGNOSIS — W57XXXA Bitten or stung by nonvenomous insect and other nonvenomous arthropods, initial encounter: Secondary | ICD-10-CM | POA: Diagnosis present

## 2019-12-09 DIAGNOSIS — M7989 Other specified soft tissue disorders: Secondary | ICD-10-CM

## 2019-12-09 DIAGNOSIS — Z9049 Acquired absence of other specified parts of digestive tract: Secondary | ICD-10-CM | POA: Diagnosis not present

## 2019-12-09 DIAGNOSIS — Z20822 Contact with and (suspected) exposure to covid-19: Secondary | ICD-10-CM | POA: Diagnosis present

## 2019-12-09 DIAGNOSIS — D61818 Other pancytopenia: Secondary | ICD-10-CM | POA: Diagnosis present

## 2019-12-09 DIAGNOSIS — Z8249 Family history of ischemic heart disease and other diseases of the circulatory system: Secondary | ICD-10-CM | POA: Diagnosis not present

## 2019-12-09 DIAGNOSIS — M810 Age-related osteoporosis without current pathological fracture: Secondary | ICD-10-CM | POA: Diagnosis present

## 2019-12-09 DIAGNOSIS — Z820 Family history of epilepsy and other diseases of the nervous system: Secondary | ICD-10-CM | POA: Diagnosis not present

## 2019-12-09 DIAGNOSIS — F329 Major depressive disorder, single episode, unspecified: Secondary | ICD-10-CM | POA: Diagnosis present

## 2019-12-09 DIAGNOSIS — K5909 Other constipation: Secondary | ICD-10-CM | POA: Diagnosis present

## 2019-12-09 DIAGNOSIS — Z833 Family history of diabetes mellitus: Secondary | ICD-10-CM | POA: Diagnosis not present

## 2019-12-09 DIAGNOSIS — L03115 Cellulitis of right lower limb: Secondary | ICD-10-CM | POA: Diagnosis present

## 2019-12-09 DIAGNOSIS — E611 Iron deficiency: Secondary | ICD-10-CM | POA: Diagnosis present

## 2019-12-09 DIAGNOSIS — Z79899 Other long term (current) drug therapy: Secondary | ICD-10-CM | POA: Diagnosis not present

## 2019-12-09 DIAGNOSIS — E872 Acidosis: Secondary | ICD-10-CM | POA: Diagnosis present

## 2019-12-09 LAB — CBC WITH DIFFERENTIAL/PLATELET
Abs Immature Granulocytes: 0.03 10*3/uL (ref 0.00–0.07)
Basophils Absolute: 0 10*3/uL (ref 0.0–0.1)
Basophils Relative: 0 %
Eosinophils Absolute: 0 10*3/uL (ref 0.0–0.5)
Eosinophils Relative: 0 %
HCT: 25.7 % — ABNORMAL LOW (ref 36.0–46.0)
Hemoglobin: 8.1 g/dL — ABNORMAL LOW (ref 12.0–15.0)
Immature Granulocytes: 1 %
Lymphocytes Relative: 30 %
Lymphs Abs: 1 10*3/uL (ref 0.7–4.0)
MCH: 33.8 pg (ref 26.0–34.0)
MCHC: 31.5 g/dL (ref 30.0–36.0)
MCV: 107.1 fL — ABNORMAL HIGH (ref 80.0–100.0)
Monocytes Absolute: 1.7 10*3/uL — ABNORMAL HIGH (ref 0.1–1.0)
Monocytes Relative: 48 %
Neutro Abs: 0.7 10*3/uL — ABNORMAL LOW (ref 1.7–7.7)
Neutrophils Relative %: 21 %
Platelets: 71 10*3/uL — ABNORMAL LOW (ref 150–400)
RBC: 2.4 MIL/uL — ABNORMAL LOW (ref 3.87–5.11)
RDW: 14.9 % (ref 11.5–15.5)
WBC: 3.4 10*3/uL — ABNORMAL LOW (ref 4.0–10.5)
nRBC: 0 % (ref 0.0–0.2)

## 2019-12-09 LAB — BASIC METABOLIC PANEL
Anion gap: 8 (ref 5–15)
BUN: 13 mg/dL (ref 8–23)
CO2: 22 mmol/L (ref 22–32)
Calcium: 8.2 mg/dL — ABNORMAL LOW (ref 8.9–10.3)
Chloride: 104 mmol/L (ref 98–111)
Creatinine, Ser: 0.91 mg/dL (ref 0.44–1.00)
GFR calc Af Amer: >60 mL/min (ref >60)
GFR calc non Af Amer: 56 mL/min — ABNORMAL LOW (ref >60)
Glucose, Bld: 104 mg/dL — ABNORMAL HIGH (ref 70–99)
Potassium: 3.8 mmol/L (ref 3.5–5.1)
Sodium: 134 mmol/L — ABNORMAL LOW (ref 135–145)

## 2019-12-09 MED ORDER — SODIUM CHLORIDE 0.9 % IV SOLN
510.0000 mg | Freq: Once | INTRAVENOUS | Status: AC
  Administered 2019-12-09: 510 mg via INTRAVENOUS
  Filled 2019-12-09: qty 510

## 2019-12-09 NOTE — Progress Notes (Signed)
Right lower extremity venous duplex has been completed. Preliminary results can be found in CV Proc through chart review.   12/09/19 11:55 AM Carlos Levering RVT

## 2019-12-09 NOTE — Care Management Obs Status (Signed)
[place] NOTIFICATION   Patient Details  Name: Alison Bridges MRN: [id number] Date of Birth: [birth year]   Medicare Observation Status Notification Given:  Yes    Trish Mage, LCSW 12/09/2019, 3:18 PM

## 2019-12-09 NOTE — Progress Notes (Signed)
PROGRESS NOTE    Alison Bridges  [id number] DOB: 1928-11-14 DOA: 12/08/2019 PCP: Burnard Bunting, MD   Brief Narrative:  Patient is 86 year old female with history of hypothyroidism, depression, osteoporosis who presented to the emergency department with complaints of redness, swelling, discomfort of her right foot/ankle.  She lives alone and ambulates with the help of walker.  No history of trauma.  On presentation she was hemodynamically stable.  Lab work showed pancytopenia.  X-ray of the right ankle showed soft tissue swelling without any acute osseous finding.  She was admitted for the management of right lower extremity cellulitis.She suspected spider bite but did not see the insect.  Assessment & Plan:   Principal Problem:   Cellulitis of right ankle Active Problems:   Pancytopenia (HCC)   Acute right foot/ankle cellulitis: Patient felt that she might have been bitten by an spider/insect but there was no obvious insect bite marks.  Started  IV antibiotics.  Currently on cefazolin.  We will follow-up cultures.  Patient with lactic acidosis which has resolved.  Physical therapy evaluation requested who recommended outpatient follow-up. Her right lower extremity swelling has not decreased significantly since yesterday so we will continue the IV antibiotics for today.  Venous Doppler was negative for DVT.  Pancytopenia: Unclear etiology.  Could be associated with underlying bone marrow aplasia versus myelodysplastic syndrome in this elderly female.  ED physician discussed with on-call hematologist who recommended outpatient follow-up. Lab work showed macrocytosis.  Normal vitamin [id number], folic acid.  Iron deficiency: Iron of just 27.  S/p a dose of IV iron.  Will order iron supplements on discharge.  Constipation: Chronic issue.  Continue MiraLAX and senna  Hypothyroidism: Continue Synthyroid.  Depression: Continue home regimen.         DVT prophylaxis: Code Status:  Family  Communication: Discussed with the patient Status is: Observation  The patient remains OBS appropriate and will d/c before 2 midnights.  Dispo: The patient is from: Home              Anticipated d/c is to: Home              Anticipated d/c date is: 1 day              Patient currently is not medically stable to d/c.  Still has significant swelling of the affected leg   Consultants: None  Procedures:None  Antimicrobials:  Anti-infectives (From admission, onward)   Start     Dose/Rate Route Frequency Ordered Stop   12/08/19 1630  ceFAZolin (ANCEF) IVPB 1 g/50 mL premix     1 g 100 mL/hr over 30 Minutes Intravenous Every 8 hours 12/08/19 1617     12/08/19 1045  ceFAZolin (ANCEF) IVPB 1 g/50 mL premix     1 g 100 mL/hr over 30 Minutes Intravenous  Once 12/08/19 1033 12/08/19 1118      Subjective: Patient seen and examined at the bedside this morning.  Hemodynamically stable.  Afebrile.  She still has significant swelling of her left ankle/foot and it has not significantly changed from yesterday.  Denies any other complaints.  Eager to go home.  Objective: Vitals:   12/08/19 1930 12/08/19 2010 12/08/19 2308 12/09/19 0401  BP: 133/62 (!) 133/57 132/61 121/60  Pulse: 74 75 82 83  Resp:  18 18 18   Temp:  98 F (36.7 C) 99.5 F (37.5 C) 98.6 F (37 C)  TempSrc:  Oral Oral Oral  SpO2: 97% 98% 94% 92%  Weight:  Height:        Intake/Output Summary (Last 24 hours) at 12/09/2019 0817 Last data filed at 12/08/2019 1943 Gross per 24 hour  Intake 1150 ml  Output --  Net 1150 ml   Filed Weights   12/08/19 1633  Weight: 59 kg    Examination:  General exam: Appears calm and comfortable ,Not in distress, pleasant elderly female HEENT:PERRL,Oral mucosa moist, Ear/Nose normal on gross exam Respiratory system: Bilateral equal air entry, normal vesicular breath sounds, no wheezes or crackles  Cardiovascular system: S1 & S2 heard, RRR. No JVD, murmurs, rubs, gallops or clicks. No  pedal edema. Gastrointestinal system: Abdomen is nondistended, soft and nontender. No organomegaly or masses felt. Normal bowel sounds heard. Central nervous system: Alert and oriented. No focal neurological deficits. Extremities: left ankle/foot  Edema/erythema , no clubbing ,no cyanosis Skin: n ulcers,no icterus ,no pallor         Data Reviewed: I have personally reviewed following labs and imaging studies  CBC: Recent Labs  Lab 12/08/19 0934 12/09/19 0554  WBC 2.4* 3.4*  NEUTROABS 0.8* 0.7*  HGB 8.6* 8.1*  HCT 26.5* 25.7*  MCV 106.9* 107.1*  PLT 81* 71*   Basic Metabolic Panel: Recent Labs  Lab 12/08/19 0934  NA 136  K 3.9  CL 104  CO2 23  GLUCOSE 172*  BUN 17  CREATININE 1.06*  CALCIUM 8.7*   GFR: Estimated Creatinine Clearance: 34.2 mL/min (A) (by C-G formula based on SCr of 1.06 mg/dL (H)). Liver Function Tests: No results for input(s): AST, ALT, ALKPHOS, BILITOT, PROT, ALBUMIN in the last 168 hours. No results for input(s): LIPASE, AMYLASE in the last 168 hours. No results for input(s): AMMONIA in the last 168 hours. Coagulation Profile: No results for input(s): INR, PROTIME in the last 168 hours. Cardiac Enzymes: No results for input(s): CKTOTAL, CKMB, CKMBINDEX, TROPONINI in the last 168 hours. BNP (last 3 results) No results for input(s): PROBNP in the last 8760 hours. HbA1C: No results for input(s): HGBA1C in the last 72 hours. CBG: No results for input(s): GLUCAP in the last 168 hours. Lipid Profile: No results for input(s): CHOL, HDL, LDLCALC, TRIG, CHOLHDL, LDLDIRECT in the last 72 hours. Thyroid Function Tests: No results for input(s): TSH, T4TOTAL, FREET4, T3FREE, THYROIDAB in the last 72 hours. Anemia Panel: Recent Labs    12/08/19 0934  VITAMINB12 931*  FOLATE 52.3  FERRITIN 167  TIBC 281  IRON 27*  RETICCTPCT 2.8   Sepsis Labs: Recent Labs  Lab 12/08/19 0934 12/08/19 1134  LATICACIDVEN 2.2* 1.3    Recent Results (from  the past 240 hour(s))  Culture, blood (routine x 2)     Status: None (Preliminary result)   Collection Time: 12/08/19  9:34 AM   Specimen: BLOOD  Result Value Ref Range Status   Specimen Description   Final    BLOOD RIGHT WRIST Performed at [facility] [address] [place], [place] [address]    Special Requests   Final    BOTTLES DRAWN AEROBIC AND ANAEROBIC Blood Culture adequate volume Performed at [facility] [address] [place], [place] [address]    Culture   Final    NO GROWTH < 24 HOURS Performed at [facility] [address] [place], [place] [address]    Report Status PENDING  Incomplete  Culture, blood (routine x 2)     Status: None (Preliminary result)   Collection Time: 12/08/19  9:39 AM   Specimen:  BLOOD  Result Value Ref Range Status   Specimen Description   Final    BLOOD LEFT ANTECUBITAL Performed at [facility] [address] [place], [place] [address]    Special Requests   Final    BOTTLES DRAWN AEROBIC AND ANAEROBIC Blood Culture results may not be optimal due to an excessive volume of blood received in culture bottles Performed at [facility] [address], [place], [place] [address]    Culture   Final    NO GROWTH < 24 HOURS Performed at [facility] [address] [place], [place] [address]    Report Status PENDING  Incomplete  Respiratory Panel by RT PCR (Flu A&B, Covid) - Nasopharyngeal Swab     Status: None   Collection Time: 12/08/19 12:21 PM   Specimen: Nasopharyngeal Swab  Result Value Ref Range Status   SARS Coronavirus 2 by RT PCR NEGATIVE NEGATIVE Final    Comment: (NOTE) SARS-CoV-2 target nucleic acids are NOT DETECTED. The SARS-CoV-2 RNA is generally detectable in upper respiratoy specimens during the acute phase of infection. The lowest concentration of SARS-CoV-2 viral copies this assay can detect is 131 copies/mL. A negative result  does not preclude SARS-Cov-2 infection and should not be used as the sole basis for treatment or other patient management decisions. A negative result may occur with  improper specimen collection/handling, submission of specimen other than nasopharyngeal swab, presence of viral mutation(s) within the areas targeted by this assay, and inadequate number of viral copies (<131 copies/mL). A negative result must be combined with clinical observations, patient history, and epidemiological information. The expected result is Negative. Fact Sheet for Patients:  PinkCheek.be Fact Sheet for Healthcare Providers:  GravelBags.it This test is not yet ap proved or cleared by the [place] FDA and  has been authorized for detection and/or diagnosis of SARS-CoV-2 by FDA under an Emergency Use Authorization (EUA). This EUA will remain  in effect (meaning this test can be used) for the duration of the COVID-19 declaration under Section 564(b)(1) of the Act, 21 U.S.C. section 360bbb-3(b)(1), unless the authorization is terminated or revoked sooner.    Influenza A by PCR NEGATIVE NEGATIVE Final   Influenza B by PCR NEGATIVE NEGATIVE Final    Comment: (NOTE) The Xpert Xpress SARS-CoV-2/FLU/RSV assay is intended as an aid in  the diagnosis of influenza from Nasopharyngeal swab specimens and  should not be used as a sole basis for treatment. Nasal washings and  aspirates are unacceptable for Xpert Xpress SARS-CoV-2/FLU/RSV  testing. Fact Sheet for Patients: PinkCheek.be Fact Sheet for Healthcare Providers: GravelBags.it This test is not yet approved or cleared by the [place] FDA and  has been authorized for detection and/or diagnosis of SARS-CoV-2 by  FDA under an Emergency Use Authorization (EUA). This EUA will remain  in effect (meaning this test can be used) for the duration of  the  Covid-19 declaration under Section 564(b)(1) of the Act, 21  U.S.C. section 360bbb-3(b)(1), unless the authorization is  terminated or revoked. Performed at [facility], [place] [address] [place], [place] [address]          Radiology Studies: DG Ankle Complete Right  Result Date: 12/08/2019 CLINICAL DATA:  Ankle  swelling.  Possible insect bite EXAM: RIGHT ANKLE - COMPLETE 3+ VIEW COMPARISON:  None. FINDINGS: There is no evidence of fracture, dislocation, or joint effusion. Osteopenia. Nonspecific soft tissue swelling about the ankle IMPRESSION: Soft tissue swelling without acute  osseous finding. Electronically Signed   By: Monte Fantasia M.D.   On: 12/08/2019 10:28        Scheduled Meds: . cholecalciferol  1,000 Units Oral Daily  . levothyroxine  125 mcg Oral QAC breakfast  . melatonin  3 mg Oral QHS  . multivitamin with minerals  1 tablet Oral Daily  . pantoprazole  40 mg Oral Daily  . senna  1 tablet Oral QHS  . sertraline  50 mg Oral Daily  . sodium chloride flush  3 mL Intravenous Q12H   Continuous Infusions: .  ceFAZolin (ANCEF) IV 1 g (12/09/19 0129)     LOS: 0 days    Time spent: 35 mins,More than 50% of that time was spent in counseling and/or coordination of care.      Shelly Coss, MD [facility] P5/01/2020, 8:17 AM

## 2019-12-09 NOTE — Plan of Care (Signed)
  Problem: Health Behavior/Discharge Planning: Goal: Ability to manage health-related needs will improve Outcome: Progressing   Problem: Education: Goal: Knowledge of General Education information will improve Description: Including pain rating scale, medication(s)/side effects and non-pharmacologic comfort measures Outcome: Progressing   Problem: Clinical Measurements: Goal: Ability to maintain clinical measurements within normal limits will improve Outcome: Progressing   

## 2019-12-10 ENCOUNTER — Other Ambulatory Visit: Payer: Self-pay | Admitting: Oncology

## 2019-12-10 DIAGNOSIS — D61818 Other pancytopenia: Secondary | ICD-10-CM

## 2019-12-10 LAB — CBC WITH DIFFERENTIAL/PLATELET
Abs Immature Granulocytes: 0.02 10*3/uL (ref 0.00–0.07)
Basophils Absolute: 0 10*3/uL (ref 0.0–0.1)
Basophils Relative: 0 %
Eosinophils Absolute: 0 10*3/uL (ref 0.0–0.5)
Eosinophils Relative: 0 %
HCT: 25.1 % — ABNORMAL LOW (ref 36.0–46.0)
Hemoglobin: 8.2 g/dL — ABNORMAL LOW (ref 12.0–15.0)
Immature Granulocytes: 1 %
Lymphocytes Relative: 31 %
Lymphs Abs: 0.9 10*3/uL (ref 0.7–4.0)
MCH: 34.9 pg — ABNORMAL HIGH (ref 26.0–34.0)
MCHC: 32.7 g/dL (ref 30.0–36.0)
MCV: 106.8 fL — ABNORMAL HIGH (ref 80.0–100.0)
Monocytes Absolute: 1.4 10*3/uL — ABNORMAL HIGH (ref 0.1–1.0)
Monocytes Relative: 47 %
Neutro Abs: 0.6 10*3/uL — ABNORMAL LOW (ref 1.7–7.7)
Neutrophils Relative %: 21 %
Platelets: 66 10*3/uL — ABNORMAL LOW (ref 150–400)
RBC: 2.35 MIL/uL — ABNORMAL LOW (ref 3.87–5.11)
RDW: 14.8 % (ref 11.5–15.5)
WBC: 2.9 10*3/uL — ABNORMAL LOW (ref 4.0–10.5)
nRBC: 0 % (ref 0.0–0.2)

## 2019-12-10 LAB — BASIC METABOLIC PANEL
Anion gap: 9 (ref 5–15)
BUN: 14 mg/dL (ref 8–23)
CO2: 22 mmol/L (ref 22–32)
Calcium: 8.4 mg/dL — ABNORMAL LOW (ref 8.9–10.3)
Chloride: 105 mmol/L (ref 98–111)
Creatinine, Ser: 0.94 mg/dL (ref 0.44–1.00)
GFR calc Af Amer: >60 mL/min (ref >60)
GFR calc non Af Amer: 54 mL/min — ABNORMAL LOW (ref >60)
Glucose, Bld: 105 mg/dL — ABNORMAL HIGH (ref 70–99)
Potassium: 3.7 mmol/L (ref 3.5–5.1)
Sodium: 136 mmol/L (ref 135–145)

## 2019-12-10 MED ORDER — FERROUS SULFATE 325 (65 FE) MG PO TABS: 325.0000 mg | ORAL_TABLET | Freq: Every day | ORAL | 1 refills | Status: DC

## 2019-12-10 MED ORDER — SULFAMETHOXAZOLE-TRIMETHOPRIM 800-160 MG PO TABS
1.0000 | ORAL_TABLET | Freq: Two times a day (BID) | ORAL | 0 refills | Status: AC
Start: 2019-12-10 — End: 2019-12-14

## 2019-12-10 NOTE — TOC Transition Note (Signed)
Transition of Care [facility]) - CM/SW Discharge Note   Patient Details  Name: Alison Bridges MRN: [id number] Date of Birth: 19-Feb-1929  [facility]) CM/SW Contact:  Trish Mage, LCSW Phone Number: 12/10/2019, 12:04 PM   Clinical Narrative:  Patient to d/c today.  Has  Been referred to [facility] for PT per PT recommendation. TOC sign off.    Final next level of care: OP Rehab Barriers to Discharge: No Barriers Identified   Patient Goals and CMS Choice        Discharge Placement                       Discharge Plan and Services                                     Social Determinants of Health (SDOH) Interventions     Readmission Risk Interventions No flowsheet data found.

## 2019-12-10 NOTE — Progress Notes (Signed)
Brief hematology note:  I received a call from the hospitalist to notify me that this patient will likely discharge later today.  He requested outpatient referral to hematology for pancytopenia.  Referral has been entered and sent to the new patient coordinator.  The patient will be contacted with the date and time of her appointment.  Mikey Bussing, DNP, AGPCNP-BC, AOCNP Mon/Tues/Thurs/Fri 7am-5pm; Off Wednesdays Cell: [phone]

## 2019-12-10 NOTE — Discharge Summary (Signed)
Physician Discharge Summary  Alison Bridges [id number] DOB: September 03, 1928 DOA: 12/08/2019  PCP: Burnard Bunting, MD  Admit date: 12/08/2019 Discharge date: 12/10/2019  Admitted From: Home Disposition:  Home  Discharge Condition:Stable CODE STATUS:FULL Diet recommendation: Heart Healthy   Brief/Interim Summary:  Patient is 86 year old female with history of hypothyroidism, depression, osteoporosis who presented to the emergency department with complaints of redness, swelling, discomfort of her right foot/ankle.  She lives alone and ambulates with the help of walker.  No history of trauma.  On presentation she was hemodynamically stable.  Lab work showed pancytopenia.  X-ray of the right ankle showed soft tissue swelling without any acute osseous finding.  She was admitted for the management of right lower extremity cellulitis.She suspected spider bite but did not see the insect.  She was started on broad-spectrum antibiotics.  Cellulitis has significantly improved today.  She will be discharged on oral antibiotic.  Physical therapy recommended outpatient PT.  Problems addressed:  Acute right foot/ankle cellulitis: Patient felt that she might have been bitten by an spider/insect but there was no obvious insect bite marks.  Started  IV antibiotics.  She was  on cefazolin.  Cultures remain negative.  Patient presented with  lactic acidosis and it  has resolved.  Physical therapy evaluation r recommended outpatient follow-up. Her right lower extremity swelling has  decreased significantly since yesterday. Venous Doppler was negative for DVT.  She will be discharged on Bactrim.  Pancytopenia: Unclear etiology.  Could be associated with underlying bone marrow aplasia versus myelodysplastic syndrome in this elderly female.  We have talked to hematology and  they recommended outpatient follow-up. Lab work showed macrocytosis.  Normal vitamin 123456, folic acid.  Iron deficiency: Iron of just 27.  S/p a  dose of IV iron.  Will order iron supplements on discharge.  Constipation: Chronic issue.  Continue MiraLAX and senna  Hypothyroidism: Continue Synthyroid.  Depression: Continue home regimen.    Discharge Diagnoses:  Principal Problem:   Cellulitis of right ankle Active Problems:   Pancytopenia [facility])    Discharge Instructions  Discharge Instructions    Ambulatory referral to Physical Therapy   Complete by: As directed    Diet - low sodium heart healthy   Complete by: As directed    Discharge instructions   Complete by: As directed    1)Please take prescribed medications as instructed. 2)Follow up with your PCP in a week 3)Follow up with outpatient Physical therapy 4)You will be called by hematology for outpatient follow up.   Increase activity slowly   Complete by: As directed      Allergies as of 12/10/2019   No Known Allergies     Medication List    TAKE these medications   cholecalciferol 1000 units tablet Commonly known as: VITAMIN D Take 1,000 Units by mouth daily.   ferrous sulfate 325 (65 FE) MG tablet Take 1 tablet (325 mg total) by mouth daily.   fluticasone 50 MCG/ACT nasal spray Commonly known as: FLONASE Place 1-2 sprays into both nostrils daily as needed for allergies or rhinitis.   hydrocortisone cream 1 % Apply 1 application topically 2 (two) times daily as needed (swollen/red foot).   ibuprofen 200 MG tablet Commonly known as: ADVIL Take 200 mg by mouth every 6 (six) hours as needed for headache or moderate pain.   levothyroxine 125 MCG tablet Commonly known as: SYNTHROID Take 125 mcg by mouth daily before breakfast.   loratadine 10 MG tablet Commonly known as: CLARITIN Take  10 mg by mouth daily.   MELATONIN PO Take 1 tablet by mouth at bedtime.   MiraLax 17 g packet Generic drug: polyethylene glycol Take 17 g by mouth daily as needed for mild constipation.   multivitamin with minerals Tabs tablet Take 1 tablet by mouth  daily.   pantoprazole 40 MG tablet Commonly known as: PROTONIX Take 40 mg by mouth daily.   sertraline 50 MG tablet Commonly known as: ZOLOFT Take 50 mg by mouth daily.   sulfamethoxazole-trimethoprim 800-160 MG tablet Commonly known as: BACTRIM DS Take 1 tablet by mouth 2 (two) times daily for 4 days.   ZINC PO Take 1 tablet by mouth daily.      Follow-up Information    Burnard Bunting, MD. Schedule an appointment as soon as possible for a visit in 1 week(s).   Specialty: Internal Medicine Contact information: [address] [place] [address] [phone]          No Known Allergies  Consultations:  None   Procedures/Studies: DG Ankle Complete Right  Result Date: 12/08/2019 CLINICAL DATA:  Ankle  swelling.  Possible insect bite EXAM: RIGHT ANKLE - COMPLETE 3+ VIEW COMPARISON:  None. FINDINGS: There is no evidence of fracture, dislocation, or joint effusion. Osteopenia. Nonspecific soft tissue swelling about the ankle IMPRESSION: Soft tissue swelling without acute osseous finding. Electronically Signed   By: Monte Fantasia M.D.   On: 12/08/2019 10:28   VAS [place] LOWER EXTREMITY VENOUS (DVT)  Result Date: 12/09/2019  Lower Venous DVTStudy Indications: Swelling.  Risk Factors: None identified. Comparison Study: No prior studies. Performing Technologist: Oliver Hum RVT  Examination Guidelines: A complete evaluation includes B-mode imaging, spectral Doppler, color Doppler, and power Doppler as needed of all accessible portions of each vessel. Bilateral testing is considered an integral part of a complete examination. Limited examinations for reoccurring indications may be performed as noted. The reflux portion of the exam is performed with the patient in reverse Trendelenburg.  +---------+---------------+---------+-----------+----------+--------------+ RIGHT    CompressibilityPhasicitySpontaneityPropertiesThrombus Aging  +---------+---------------+---------+-----------+----------+--------------+ CFV      Full           Yes      Yes                                 +---------+---------------+---------+-----------+----------+--------------+ SFJ      Full                                                        +---------+---------------+---------+-----------+----------+--------------+ FV Prox  Full                                                        +---------+---------------+---------+-----------+----------+--------------+ FV Mid   Full                                                        +---------+---------------+---------+-----------+----------+--------------+ FV DistalFull                                                        +---------+---------------+---------+-----------+----------+--------------+  PFV      Full                                                        +---------+---------------+---------+-----------+----------+--------------+ POP      Full           Yes      Yes                                 +---------+---------------+---------+-----------+----------+--------------+ PTV      Full                                                        +---------+---------------+---------+-----------+----------+--------------+ PERO     Full                                                        +---------+---------------+---------+-----------+----------+--------------+   +----+---------------+---------+-----------+----------+--------------+ LEFTCompressibilityPhasicitySpontaneityPropertiesThrombus Aging +----+---------------+---------+-----------+----------+--------------+ CFV Full           Yes      Yes                                 +----+---------------+---------+-----------+----------+--------------+     Summary: RIGHT: - There is no evidence of deep vein thrombosis in the lower extremity.  - No cystic structure found in the popliteal fossa.   LEFT: - No evidence of common femoral vein obstruction.  *See table(s) above for measurements and observations. Electronically signed by Servando Snare MD on 12/09/2019 at 5:28:11 PM.    Final       Subjective: Patient seen and examined at the bedside this morning.  Hemodynamically stable for discharge home today.  Discharge Exam: Vitals:   12/09/19 2138 12/10/19 0531  BP: (!) 130/56 129/61  Pulse: 76 72  Resp: 20 16  Temp: 98.4 F (36.9 C) 98.6 F (37 C)  SpO2: 93% 92%   Vitals:   12/09/19 0840 12/09/19 1533 12/09/19 2138 12/10/19 0531  BP: (!) 125/58 (!) 149/70 (!) 130/56 129/61  Pulse: 72 75 76 72  Resp: 18 18 20 16   Temp: 98.3 F (36.8 C) 97.6 F (36.4 C) 98.4 F (36.9 C) 98.6 F (37 C)  TempSrc: Oral Oral Oral Oral  SpO2: 95% 94% 93% 92%  Weight:      Height:        General: Pt is alert, awake, not in acute distress Cardiovascular: RRR, S1/S2 +, no rubs, no gallops Respiratory: CTA bilaterally, no wheezing, no rhonchi Abdominal: Soft, NT, ND, bowel sounds + Extremities: no edema, no cyanosis, improving cellulitis of the right lower extremity    The results of significant diagnostics from this hospitalization (including imaging, microbiology, ancillary and laboratory) are listed below for reference.     Microbiology: Recent Results (from the past 240 hour(s))  Culture, blood (routine x 2)     Status: None (Preliminary result)   Collection Time:  12/08/19  9:34 AM   Specimen: BLOOD  Result Value Ref Range Status   Specimen Description   Final    BLOOD RIGHT WRIST Performed at [facility] [address] [place], [place] [address]    Special Requests   Final    BOTTLES DRAWN AEROBIC AND ANAEROBIC Blood Culture adequate volume Performed at [facility] [address], [place], [place] [address]    Culture   Final    NO GROWTH 2 DAYS Performed at [facility] [address] [place], [place] [address]    Report  Status PENDING  Incomplete  Culture, blood (routine x 2)     Status: None (Preliminary result)   Collection Time: 12/08/19  9:39 AM   Specimen: BLOOD  Result Value Ref Range Status   Specimen Description   Final    BLOOD LEFT ANTECUBITAL Performed at [facility] [address], [place], [place] [address]    Special Requests   Final    BOTTLES DRAWN AEROBIC AND ANAEROBIC Blood Culture results may not be optimal due to an excessive volume of blood received in culture bottles Performed at [facility] [address], [place], [place] [address]    Culture   Final    NO GROWTH 2 DAYS Performed at [facility] [address], [place], [place] [address]    Report Status PENDING  Incomplete  Respiratory Panel by RT PCR (Flu A&B, Covid) - Nasopharyngeal Swab     Status: None   Collection Time: 12/08/19 12:21 PM   Specimen: Nasopharyngeal Swab  Result Value Ref Range Status   SARS Coronavirus 2 by RT PCR NEGATIVE NEGATIVE Final    Comment: (NOTE) SARS-CoV-2 target nucleic acids are NOT DETECTED. The SARS-CoV-2 RNA is generally detectable in upper respiratoy specimens during the acute phase of infection. The lowest concentration of SARS-CoV-2 viral copies this assay can detect is 131 copies/mL. A negative result does not preclude SARS-Cov-2 infection and should not be used as the sole basis for treatment or other patient management decisions. A negative result may occur with  improper specimen collection/handling, submission of specimen other than nasopharyngeal swab, presence of viral mutation(s) within the areas targeted by this assay, and inadequate number of viral copies (<131 copies/mL). A negative result must be combined with clinical observations, patient history, and epidemiological information. The expected result is Negative. Fact Sheet for Patients:  PinkCheek.be Fact Sheet for Healthcare Providers:   GravelBags.it This test is not yet ap proved or cleared by the [place] FDA and  has been authorized for detection and/or diagnosis of SARS-CoV-2 by FDA under an Emergency Use Authorization (EUA). This EUA will remain  in effect (meaning this test can be used) for the duration of the COVID-19 declaration under Section 564(b)(1) of the Act, 21 U.S.C. section 360bbb-3(b)(1), unless the authorization is terminated or revoked sooner.    Influenza A by PCR NEGATIVE NEGATIVE Final   Influenza B by PCR NEGATIVE NEGATIVE Final    Comment: (NOTE) The Xpert Xpress SARS-CoV-2/FLU/RSV assay is intended as an aid in  the diagnosis of influenza from Nasopharyngeal swab specimens and  should not be used as a sole basis for treatment. Nasal washings and  aspirates are unacceptable for Xpert Xpress SARS-CoV-2/FLU/RSV  testing. Fact Sheet for Patients: PinkCheek.be Fact Sheet for Healthcare Providers: GravelBags.it This test is not yet approved or cleared by the [place] and  has been authorized  for detection and/or diagnosis of SARS-CoV-2 by  FDA under an Emergency Use Authorization (EUA). This EUA will remain  in effect (meaning this test can be used) for the duration of the  Covid-19 declaration under Section 564(b)(1) of the Act, 21  U.S.C. section 360bbb-3(b)(1), unless the authorization is  terminated or revoked. Performed at [facility], [place] [address] [place], [place] [address]      Labs: BNP (last 3 results) No results for input(s): BNP in the last 8760 hours. Basic Metabolic Panel: Recent Labs  Lab 12/08/19 0934 12/09/19 0554 12/10/19 0526  NA 136 134* 136  K 3.9 3.8 3.7  CL 104 104 105  CO2 23 22 22   GLUCOSE 172* 104* 105*  BUN 17 13 14   CREATININE 1.06* 0.91 0.94  CALCIUM 8.7* 8.2* 8.4*   Liver Function Tests: No results for input(s): AST, ALT,  ALKPHOS, BILITOT, PROT, ALBUMIN in the last 168 hours. No results for input(s): LIPASE, AMYLASE in the last 168 hours. No results for input(s): AMMONIA in the last 168 hours. CBC: Recent Labs  Lab 12/08/19 0934 12/09/19 0554 12/10/19 0526  WBC 2.4* 3.4* 2.9*  NEUTROABS 0.8* 0.7* 0.6*  HGB 8.6* 8.1* 8.2*  HCT 26.5* 25.7* 25.1*  MCV 106.9* 107.1* 106.8*  PLT 81* 71* 66*   Cardiac Enzymes: No results for input(s): CKTOTAL, CKMB, CKMBINDEX, TROPONINI in the last 168 hours. BNP: Invalid input(s): POCBNP CBG: No results for input(s): GLUCAP in the last 168 hours. D-Dimer No results for input(s): DDIMER in the last 72 hours. Hgb A1c No results for input(s): HGBA1C in the last 72 hours. Lipid Profile No results for input(s): CHOL, HDL, LDLCALC, TRIG, CHOLHDL, LDLDIRECT in the last 72 hours. Thyroid function studies No results for input(s): TSH, T4TOTAL, T3FREE, THYROIDAB in the last 72 hours.  Invalid input(s): FREET3 Anemia work up Recent Labs    12/08/19 0934  VITAMINB12 931*  FOLATE 52.3  FERRITIN 167  TIBC 281  IRON 27*  RETICCTPCT 2.8   Urinalysis    Component Value Date/Time   COLORURINE STRAW (A) 06/03/2019 1256   APPEARANCEUR CLEAR 06/03/2019 1256   LABSPEC 1.005 06/03/2019 1256   [place] 7.0 06/03/2019 1256   [place] 06/03/2019 1256   [place] NEGATIVE 06/03/2019 1256   [place] 06/03/2019 1256   [place] 06/03/2019 1256   PROTEINUR NEGATIVE 06/03/2019 1256   NITRITE NEGATIVE 06/03/2019 1256   LEUKOCYTESUR NEGATIVE 06/03/2019 1256   Sepsis Labs Invalid input(s): PROCALCITONIN,  WBC,  LACTICIDVEN Microbiology Recent Results (from the past 240 hour(s))  Culture, blood (routine x 2)     Status: None (Preliminary result)   Collection Time: 12/08/19  9:34 AM   Specimen: BLOOD  Result Value Ref Range Status   Specimen Description   Final    BLOOD RIGHT WRIST Performed at [facility], [place] [address],  [place], [place] [address]    Special Requests   Final    BOTTLES DRAWN AEROBIC AND ANAEROBIC Blood Culture adequate volume Performed at [facility] [address] [place], [place] [address]    Culture   Final    NO GROWTH 2 DAYS Performed at [facility] [address], [place], [place] [address]    Report Status PENDING  Incomplete  Culture, blood (routine x 2)     Status: None (Preliminary result)   Collection Time: 12/08/19  9:39 AM   Specimen: BLOOD  Result Value Ref Range Status  Specimen Description   Final    BLOOD LEFT ANTECUBITAL Performed at [facility] [address], [place], [place] [address]    Special Requests   Final    BOTTLES DRAWN AEROBIC AND ANAEROBIC Blood Culture results may not be optimal due to an excessive volume of blood received in culture bottles Performed at [facility] [address], [place], [place] [address]    Culture   Final    NO GROWTH 2 DAYS Performed at [facility] [address], [place], [place] [address]    Report Status PENDING  Incomplete  Respiratory Panel by RT PCR (Flu A&B, Covid) - Nasopharyngeal Swab     Status: None   Collection Time: 12/08/19 12:21 PM   Specimen: Nasopharyngeal Swab  Result Value Ref Range Status   SARS Coronavirus 2 by RT PCR NEGATIVE NEGATIVE Final    Comment: (NOTE) SARS-CoV-2 target nucleic acids are NOT DETECTED. The SARS-CoV-2 RNA is generally detectable in upper respiratoy specimens during the acute phase of infection. The lowest concentration of SARS-CoV-2 viral copies this assay can detect is 131 copies/mL. A negative result does not preclude SARS-Cov-2 infection and should not be used as the sole basis for treatment or other patient management decisions. A negative result may occur with  improper specimen collection/handling, submission of specimen other than nasopharyngeal swab, presence of viral mutation(s) within  the areas targeted by this assay, and inadequate number of viral copies (<131 copies/mL). A negative result must be combined with clinical observations, patient history, and epidemiological information. The expected result is Negative. Fact Sheet for Patients:  PinkCheek.be Fact Sheet for Healthcare Providers:  GravelBags.it This test is not yet ap proved or cleared by the [place] FDA and  has been authorized for detection and/or diagnosis of SARS-CoV-2 by FDA under an Emergency Use Authorization (EUA). This EUA will remain  in effect (meaning this test can be used) for the duration of the COVID-19 declaration under Section 564(b)(1) of the Act, 21 U.S.C. section 360bbb-3(b)(1), unless the authorization is terminated or revoked sooner.    Influenza A by PCR NEGATIVE NEGATIVE Final   Influenza B by PCR NEGATIVE NEGATIVE Final    Comment: (NOTE) The Xpert Xpress SARS-CoV-2/FLU/RSV assay is intended as an aid in  the diagnosis of influenza from Nasopharyngeal swab specimens and  should not be used as a sole basis for treatment. Nasal washings and  aspirates are unacceptable for Xpert Xpress SARS-CoV-2/FLU/RSV  testing. Fact Sheet for Patients: PinkCheek.be Fact Sheet for Healthcare Providers: GravelBags.it This test is not yet approved or cleared by the [place] FDA and  has been authorized for detection and/or diagnosis of SARS-CoV-2 by  FDA under an Emergency Use Authorization (EUA). This EUA will remain  in effect (meaning this test can be used) for the duration of the  Covid-19 declaration under Section 564(b)(1) of the Act, 21  U.S.C. section 360bbb-3(b)(1), unless the authorization is  terminated or revoked. Performed at [facility], [place] [address], [place], [place] [address]     Please note: You were cared for by a hospitalist  during your hospital stay. Once you are discharged, your primary care physician will handle any further medical issues. Please note that NO REFILLS for any discharge medications will be authorized once you are discharged, as it is imperative that you return to your primary care physician (or establish a relationship with a primary care physician if you do not have one)  for your post hospital discharge needs so that they can reassess your need for medications and monitor your lab values.    Time coordinating discharge: 40 minutes  SIGNED:   Shelly Coss, MD  [facility] 12/10/2019, 11:01 AM Pager [phone]  If 7PM-7AM, please contact night-coverage [email or web address] Password [email or web address]

## 2019-12-13 ENCOUNTER — Telehealth: Payer: Self-pay | Admitting: Hematology

## 2019-12-13 LAB — CULTURE, BLOOD (ROUTINE X 2)
Culture: NO GROWTH
Culture: NO GROWTH
Special Requests: ADEQUATE

## 2019-12-13 NOTE — Telephone Encounter (Signed)
Received a new hem referral from Mikey Bussing, NP for pancytopenia. Pt has been cld and scheduled to see Dr. Irene Limbo on 5/13 at 3pm. Pt aware to arrive 15 minutes early.

## 2019-12-14 ENCOUNTER — Encounter: Payer: Self-pay | Admitting: *Deleted

## 2019-12-14 ENCOUNTER — Other Ambulatory Visit: Payer: Self-pay | Admitting: *Deleted

## 2019-12-14 NOTE — Patient Outreach (Signed)
[place] [facility]) Care Management [facility] CM Telephone Outreach, EMMI Red-Alert notification/ General Discharge PCP office completes Transition of Care follow up post-hospital discharge Post-hospital discharge day # 4  12/14/2019  PHYNIX WACHOWIAK 1928-10-08 [id number]  EMMI Red-Alert notification/ General Discharge EMMI call date/ day #: Sunday Dec 12, 2019; day # 4 Red-Alert reason(s): "doesn't know who to call for changes in condition;" "No scheduled follow up;" "Other questions/ problems"  Successful telephone outreach to [place], 86 y/o female referred to [facility] RN CM on Dec 13, 2019 by [facility] CMA for EMMI Red-Alert notification, as above; patient was recently hospitalized May 5-7, 2021 for (R) lower extremity edema/ cellulitis and pancytopenia possibly induced by a spider bite.  Patient has history including, but not limited to, hypothyroidism, depression, and osteoporosis.  She was discharged home to self-care without home health services.  HIPAA/ identity verified and purpose of call/ [facility] CM services were discussed with patient; patient provides verbal consent for [facility] CM services/ involvement in her care post-hospital discharge.  Today, patient reports "doing better overall," however, she reports ongoing "fatigue and weakness" post-hospital discharge.  She denies and pain and sounds to be in no distress throughout phone call today.  Patient further reports:  Medications: -- Has all medicationsand takes as prescribed;denies questions/ concerns around current medications  -- Verbalizes good general understanding of the purpose, dosing, and scheduling of medications: confirms that she has completed post-hospital discharge antibiotics and took as prescribed   -- self-manage medications  -- denies issues with swallowing medications -- patient was recently discharged from the hospital and all medications were thoroughly reviewed with patient today; no concerns  identified  Provider appointments: -- All upcoming provider appointments were reviewed with patient today and she verbalizes an accurate understanding of all, along with plans to attend all; reports continues to drive self and has "back-up" transportation through friends who live nearby, should she feel too tired/ fatigued to drive herself ---- Thursday May 13: new patient oncology provider office visit for newly diagnosed pancytopenia ---- Friday May 14: post- hospital discharge PCP office visit  Safety/ Mobility/ Falls: -- denies new/ recent falls; denies any falls over last year -- assistive devices: uses cane as indicated; reports no need for walker; states "feels steady" on feet "most of the time -- encouraged to use cane, as she reports ongoing post-hospital discharge fatigue and weakness  -- general fall risks/ prevention education discussed with patient today  Social/ Community Resource needs: -- currently denies community resource needs, stating supportive family members that assist with care needs as indicated; has 3 adult children that live in other cities, but are close enough to assist if indicated  -- patient continues to drive self and reports has "lots of friends in neighborhood and through church" that check in with her regularly; reports she has several friends that "are on call" to take her to scheduled appointments should she continue to feel weak and fatigued post-hospital discharge; reports she has already driven self to grocery store since she was discharged from hospital, and she does not anticipate needing to have someone drive her -- SDOH completed for: depression/ transportation/ food insecurity; no concerns identified   Advanced Directive (AD) Planning:   --reports currently has exisisting advanced directives in place for HCPOA and Living Will and declines desire to make changes; endorses self as "DNR" but would consider "limited" resuscitation depending on clinical  circumstances, and confirms that she has MOST form posted in her home  Self-health  management of newly diagnosed pancytopenia: -- patient has no chronic medical coniditons and reports independent in all self-care for ADL's and iADL's -- verbalizes uncertainty around new diagnosis: encouraged patient to think about/ write down any questions she has around her new diagnosis -- confirms that her lower extremity swelling and redness has improved post-hospital discharge; discussed with patient signs/ symptoms infection/ recurrent cellulitis and encouraged her to promptly notify care providers for any new concerns/ issues/ problems that arise and confirmed that she has all contact information for care providers  Patient denies further issues, concerns, or problems today.  I provided/ confirmed that patient has my direct phone number, the main [facility] CM office phone number, and the [facility] CM 24-hour nurse advice phone number should issues arise prior to next scheduled [facility] CM outreach.  Encouraged patient to contact me directly if needs, questions, issues, or concerns arise prior to next scheduled outreach; patient agreed to do so.  Plan:  Patient will take medications as prescribed and will attend all scheduled provider appointments  Patient will promptly notify care providers for any new concerns/ issues/ problems that arise  I will make patien't PCP aware of [facility] Community RN CM involvement in patient's care-- will send barriers letter  [facility] RN CM outreach to continue with scheduled phone call within 3 weeks, unless indicated sooner  [facility] CM Care Plan Problem One     Most Recent Value  Care Plan Problem One  High risk for hospital re-admission related to/ as evidenced by recent hospitalization for lower extremity cellulitis with new diagnosis of pancytopenia in patient who lives alone  Role Documenting the Problem One  Care Management Coordinator  Care Plan for Problem One  Active  [facility] Long Term Goal    Over the next 31 days, patient will not experience hospital re-admission, as evidenced by patient reporting and review of EHR during [facility] RN CM outreach  [facility] Long Term Goal Start Date  12/14/19  Interventions for Problem One Long Term Goal  Discussed with patient her current clinical condition and completed EMMI Red-screening outreach,  successfully engaged patient in [place] services and initiated [facility] CM program,  completed SDOH for depression/ transportation/ food insecurity and confirmed no needs,  completed post-hospital discharge medication review and reviewed with patient hospital discharge instructions  [facility] CM Short Term Goal #1   Over the next 30 days, patient will attend all scheduled provider appointments, as evidenced by patient reporting, review of EHR, and collaboration with care providers as indicated during [facility] RN CM outreach  [facility] CM Short Term Goal #1 Start Date  12/14/19  Interventions for Short Term Goal #1  Reviewed with patient all upcoming scheduled provider appointments and confirmed that she has reliable transportation to all along with plans to attend all as scheduled     I appreciate the opportunity to participate in Ms. Ide's care,  Oneta Rack, RN, BSN, [employer] Coordinator [facility] Care Management  [phone]

## 2019-12-15 NOTE — Progress Notes (Signed)
HEMATOLOGY/ONCOLOGY CONSULTATION NOTE  Date of Service: 12/16/2019  Patient Care Team: Burnard Bunting, MD as PCP - General (Internal Medicine) Knox Royalty, RN as [place] Management  REFERRING PHYSICIAN: Burnard Bunting, MD  CHIEF COMPLAINTS/PURPOSE OF CONSULTATION:  Pancytopenia  HISTORY OF PRESENTING ILLNESS:   Alison Bridges is a wonderful 86 y.o. female who has been referred to [place] by Burnard Bunting, MD for evaluation and management of Pancytopenia. The pt reports that she is doing well overall.   The pt reports she is good. Pt lives alone. Prior to the pandemic she was taking a workout class. About the last 3 months pt has had fatigue, loss of appetite, and weight loss. Last October pt had fatigue, SOB, and was spitting up blood. She has acid reflux and had esophagus dilated twice. A week ago pt was in the hospital due to infection in her right ankle. Her ankle started swelling and turning red. The ankle was not painful but did have burning and tightness. She was put on antibiotics and iron.  Pt uses partial dentures and does not use denture cream. She has been having a burning and hot feeling in feet and face that comes and goes. Her thyroid medication has been stable and she takes the pills several hours before eating with just water. Pt has loss about 10 pounds due to not having appetite. Recently she has had spontaneous bruising. She has had both doses of COVID19 vaccine. Pt had been taking 78m of zync for 4-6weeks prior to hospitalization.   Of note prior to the patient's visit today, pt has had DG Ankle Complete Right ([id number] completed on 12/08/19 with results revealing "Soft tissue swelling without acute osseous finding." Pt has had Lower Venous DVT Study completed on 12/09/19 with results revealing "RIGHT: There is no evidence of deep vein thrombosis in the lower extremity. No cystic structure found in the popliteal fossa. LEFT: No evidence of  common femoral vein obstruction." Pt has had CT Abdomen Pelvis W IV Contrast completed on 10/07/19 with results revealing, " 1. No acute finding 2. Areas of enhancement in the right bladder worrisome for neoplasm 3. Cholecystectomy 4. Bilateral renal cortical cysts and nonobstructive left renal calculi. 5. Aortoiliac atherosclerosis without aneurysm. 6. Hysterectomy 7. Diverticulosis without diverticulitis. 8. Lumbar DDD and DJD with degenerative anterolisthesis of L5 on S1"  Most recent lab results (12/10/19) of CBC is as follows: all values are WNL except for WBC at 2.9K, RBC at 2.35, Hemoglobin at 8.2, HCT at 25.1, MCV at 106.8, MCH at 34.9, Platelets at 66K, Neutro Abs at 0.6K, Monocytes Absolute at 1.4K, Glucose at 105, Calcium at 8.4, GFR, Calc Non Af Amer at 54 12/08/19 of Lactic Acid, Venous at 1.3 12/08/19 of CRP at 0.8: WNL 12/08/19 of Reticulocytes is as follows: all values are WNL except for RC at 2.52, Immature Retic Fract at 16.9 12/08/19 of Ferritin at 167: WNL 12/08/19 of Iron and TIBC is as follows: all values are WNL except for Iron at 27, Saturation Ratios at 10 12/08/19 of Folate at 52.3: WNL 12/08/19 of Vitamin B12 at 931 12/08/19 of Sed Rate at 60  On review of systems, pt reports fatigue, lack of appetite, burning/hot sensation in face and feet, weight loss  and denies lumps/bumps, skin rashes, bone pain, fever, chills, night sweats, changes in bowl habits and urinary habits, nose bleeds, gum bleeds, blood in urine/stool, lightheaded, dizzy, changes in breathing, back pain, constipation and any other symptoms.  MEDICAL HISTORY:  Past Medical History:  Diagnosis Date  . Depression   . Hypothyroidism   . Osteoporosis    SURGICAL HISTORY: Past Surgical History:  Procedure Laterality Date  . ABDOMINAL HYSTERECTOMY    . APPENDECTOMY  1998  . CHOLECYSTECTOMY  1998   SOCIAL HISTORY: Social History   Socioeconomic History  . Marital status: Widowed    Spouse name:  Not on file  . Number of children: Not on file  . Years of education: Not on file  . Highest education level: Not on file  Occupational History  . Occupation: Oncologist    Comment: Retired  Tobacco Use  . Smoking status: Never Smoker  . Smokeless tobacco: Never Used  Substance and Sexual Activity  . Alcohol use: No  . Drug use: No  . Sexual activity: Not on file  Other Topics Concern  . Not on file  Social History Narrative   She is tried to stay active --  2 days a week she does senior aerobics.  She also enjoys going for walks, but no standard routine.      She has 3 children and 2 grandchildren.   Social Determinants of Health   Financial Resource Strain:   . Difficulty of Paying Living Expenses:   Food Insecurity: No Food Insecurity  . Worried About Charity fundraiser in the Last Year: Never true  . Ran Out of Food in the Last Year: Never true  Transportation Needs: No Transportation Needs  . Lack of Transportation (Medical): No  . Lack of Transportation (Non-Medical): No  Physical Activity:   . Days of Exercise per Week:   . Minutes of Exercise per Session:   Stress:   . Feeling of Stress :   Social Connections:   . Frequency of Communication with Friends and Family:   . Frequency of Social Gatherings with Friends and Family:   . Attends Religious Services:   . Active Member of Clubs or Organizations:   . Attends Archivist Meetings:   Marland Kitchen Marital Status:   Intimate Partner Violence:   . Fear of Current or Ex-Partner:   . Emotionally Abused:   Marland Kitchen Physically Abused:   . Sexually Abused:      FAMILY HISTORY: Family History  Problem Relation Age of Onset  . Heart attack Mother 40  . Pancreatic cancer Father   . Parkinson's disease Sister   . Diabetes Brother        Multiple complications  . Heart disease Brother        Not sure what happened  . Dementia Brother      ALLERGIES:   has No Known Allergies.   MEDICATIONS:  Current  Outpatient Medications  Medication Sig Dispense Refill  . cholecalciferol (VITAMIN D) 1000 UNITS tablet Take 1,000 Units by mouth daily.    . ferrous sulfate 325 (65 FE) MG tablet Take 1 tablet (325 mg total) by mouth daily. 30 tablet 1  . fluticasone (FLONASE) 50 MCG/ACT nasal spray Place 1-2 sprays into both nostrils daily as needed for allergies or rhinitis.    . hydrocortisone cream 1 % Apply 1 application topically 2 (two) times daily as needed (swollen/red foot).    Marland Kitchen ibuprofen (ADVIL) 200 MG tablet Take 200 mg by mouth every 6 (six) hours as needed for headache or moderate pain.    Marland Kitchen levothyroxine (SYNTHROID, LEVOTHROID) 125 MCG tablet Take 125 mcg by mouth daily before breakfast.    . loratadine (CLARITIN)  10 MG tablet Take 10 mg by mouth daily.    Marland Kitchen MELATONIN PO Take 1 tablet by mouth at bedtime.    . Multiple Vitamin (MULTIVITAMIN WITH MINERALS) TABS tablet Take 1 tablet by mouth daily.    . Multiple Vitamins-Minerals (ZINC PO) Take 1 tablet by mouth daily.    . pantoprazole (PROTONIX) 40 MG tablet Take 40 mg by mouth daily.    . polyethylene glycol (MIRALAX) 17 g packet Take 17 g by mouth daily as needed for mild constipation.    . sertraline (ZOLOFT) 50 MG tablet Take 50 mg by mouth daily.     No current facility-administered medications for this visit.     REVIEW OF SYSTEMS:   A 10+ POINT REVIEW OF SYSTEMS WAS OBTAINED including neurology, dermatology, psychiatry, cardiac, respiratory, lymph, extremities, GI, GU, Musculoskeletal, constitutional, breasts, reproductive, HEENT.  All pertinent positives are noted in the HPI.  All others are negative.   PHYSICAL EXAMINATION: ECOG PERFORMANCE STATUS: 2 - Symptomatic, <50% confined to bed  Vitals:   12/16/19 1527  BP: (!) 141/60  Pulse: 89  Resp: 18  Temp: 98.5 F (36.9 C)  SpO2: 99%   Filed Weights   12/16/19 1527  Weight: 127 lb 6.4 oz (57.8 kg)   Body mass index is 19.37 kg/m.  GENERAL:alert, in no acute distress  and comfortable SKIN: no acute rashes, no significant lesions EYES: conjunctiva are pink and non-injected, sclera anicteric OROPHARYNX: MMM, no exudates, no oropharyngeal erythema or ulceration NECK: supple, no JVD LYMPH:  no palpable lymphadenopathy in the cervical, axillary or inguinal regions LUNGS: clear to auscultation b/l with normal respiratory effort HEART: regular rate & rhythm ABDOMEN:  normoactive bowel sounds , non tender, not distended. Extremity: no pedal edema PSYCH: alert & oriented x 3 with fluent speech NEURO: no focal motor/sensory deficits  LABORATORY DATA:  I have reviewed the data as listed  CBC Latest Ref Rng & Units 12/10/2019 12/09/2019 12/08/2019  WBC 4.0 - 10.5 K/uL 2.9(L) 3.4(L) 2.4(L)  Hemoglobin 12.0 - 15.0 g/dL 8.2(L) 8.1(L) 8.6(L)  Hematocrit 36.0 - 46.0 % 25.1(L) 25.7(L) 26.5(L)  Platelets 150 - 400 K/uL 66(L) 71(L) 81(L)    CMP Latest Ref Rng & Units 12/10/2019 12/09/2019 12/08/2019  Glucose 70 - 99 mg/dL 105(H) 104(H) 172(H)  BUN 8 - 23 mg/dL _0 Creatinine 0.44 - 1.00 mg/dL 0.94 0.91 1.06(H)  Sodium 135 - 145 mmol/L 136 134(L) 136  Potassium 3.5 - 5.1 mmol/L 3.7 3.8 3.9  Chloride 98 - 111 mmol/L 105 104 104  CO2 22 - 32 mmol/L _1 Calcium 8.9 - 10.3 mg/dL 8.4(L) 8.2(L) 8.7(L)  Total Protein 6.5 - 8.1 g/dL - - -  Total Bilirubin 0.3 - 1.2 mg/dL - - -  Alkaline Phos 38 - 126 U/L - - -  AST 15 - 41 U/L - - -  ALT 0 - 44 U/L - - -     RADIOGRAPHIC STUDIES: I have personally reviewed the radiological images as listed and agreed with the findings in the report. DG Ankle Complete Right  Result Date: 12/08/2019 CLINICAL DATA:  Ankle  swelling.  Possible insect bite EXAM: RIGHT ANKLE - COMPLETE 3+ VIEW COMPARISON:  None. FINDINGS: There is no evidence of fracture, dislocation, or joint effusion. Osteopenia. Nonspecific soft tissue swelling about the ankle IMPRESSION: Soft tissue swelling without acute osseous finding. Electronically Signed   By:  Monte Fantasia M.D.   On: 12/08/2019 10:28   VAS  [place] LOWER EXTREMITY VENOUS (DVT)  Result Date: 12/09/2019  Lower Venous DVTStudy Indications: Swelling.  Risk Factors: None identified. Comparison Study: No prior studies. Performing Technologist: Oliver Hum RVT  Examination Guidelines: A complete evaluation includes B-mode imaging, spectral Doppler, color Doppler, and power Doppler as needed of all accessible portions of each vessel. Bilateral testing is considered an integral part of a complete examination. Limited examinations for reoccurring indications may be performed as noted. The reflux portion of the exam is performed with the patient in reverse Trendelenburg.  +---------+---------------+---------+-----------+----------+--------------+ RIGHT    CompressibilityPhasicitySpontaneityPropertiesThrombus Aging +---------+---------------+---------+-----------+----------+--------------+ CFV      Full           Yes      Yes                                 +---------+---------------+---------+-----------+----------+--------------+ SFJ      Full                                                        +---------+---------------+---------+-----------+----------+--------------+ FV Prox  Full                                                        +---------+---------------+---------+-----------+----------+--------------+ FV Mid   Full                                                        +---------+---------------+---------+-----------+----------+--------------+ FV DistalFull                                                        +---------+---------------+---------+-----------+----------+--------------+ PFV      Full                                                        +---------+---------------+---------+-----------+----------+--------------+ POP      Full           Yes      Yes                                  +---------+---------------+---------+-----------+----------+--------------+ PTV      Full                                                        +---------+---------------+---------+-----------+----------+--------------+ PERO     Full                                                        +---------+---------------+---------+-----------+----------+--------------+   +----+---------------+---------+-----------+----------+--------------+  LEFTCompressibilityPhasicitySpontaneityPropertiesThrombus Aging +----+---------------+---------+-----------+----------+--------------+ CFV Full           Yes      Yes                                 +----+---------------+---------+-----------+----------+--------------+     Summary: RIGHT: - There is no evidence of deep vein thrombosis in the lower extremity.  - No cystic structure found in the popliteal fossa.  LEFT: - No evidence of common femoral vein obstruction.  *See table(s) above for measurements and observations. Electronically signed by Servando Snare MD on 12/09/2019 at 5:28:11 PM.    Final      ASSESSMENT & PLAN:  Alison Bridges is a 86 y.o. female with:  1. Pancytopenia PLAN: -Discussed patient's most recent labs from 12/10/19,  of CBC is as follows: all values are WNL except for WBC at 2.9K, RBC at 2.35, Hemoglobin at 8.2, HCT at 25.1, MCV at 106.8, MCH at 34.9, Platelets at 66K, Neutro Abs at 0.6K, Monocytes Absolute at 1.4K, Glucose at 105, Calcium at 8.4, GFR, Calc Non Af Amer at 54 -Discussed 12/08/19 of Lactic Acid, Venous at 1.3 -Discussed 12/08/19 of CRP at 0.8: WNL -Discussed 12/08/19 of Reticulocytes is as follows: all values are WNL except for RC at 2.52, Immature Retic Fract at 16.9 -Discussed 12/08/19 of Ferritin at 167: WNL -Discussed 12/08/19 of Iron and TIBC is as follows: all values are WNL except for Iron at 27, Saturation Ratios at 10 -Discussed 12/08/19 of Folate at 52.3: WNL -Discussed 12/08/19 of Vitamin B12 at  931 -Discussed 12/08/19 of Sed Rate at 60 -Discussed 12/08/19 of DG Ankle Complete Right ([id number]) -Discussed 12/09/19 of Lower Venous DVT Study  -Advised on regular counts for RBC, WBC and platelets  -Advised on spontaneous bruising  -Advised of myelodysplasia -genetic changes in bone marrow due to age  -Advised on acute leukemia  -Advised on bone marrow biopsy and labs  -Recommends continue taking multi-vitamin -Recommends avoiding crowd exposure -Recommends bone marrow biopsy -Will get labs today    FOLLOW UP Labs today CT bone marrow aspiration and Biopsy in 1-2 weeks Phone with Dr Irene Limbo in 3 weeks  The total time spent in the appt was 60 minutes and more than 50% was on counseling and direct patient cares.  All of the patient's questions were answered with apparent satisfaction. The patient knows to call the clinic with any problems, questions or concerns.  Sullivan Lone MD MS AAHIVMS [facility] Hematology/Oncology Physician [facility]  (Office):       [phone] (Work cell):  [phone] (Fax):           [phone]  12/16/2019 4:36 PM  I, Dawayne Cirri am acting as a Education administrator for Dr. Sullivan Lone.   .I have reviewed the above documentation for accuracy and completeness, and I agree with the above. [name] MD

## 2019-12-16 ENCOUNTER — Inpatient Hospital Stay: Payer: Medicare Other

## 2019-12-16 ENCOUNTER — Inpatient Hospital Stay: Payer: Medicare Other | Attending: Hematology | Admitting: Hematology

## 2019-12-16 ENCOUNTER — Other Ambulatory Visit: Payer: Self-pay

## 2019-12-16 VITALS — BP 141/60 | HR 89 | Temp 98.5°F | Resp 18 | Ht 68.0 in | Wt 127.4 lb

## 2019-12-16 DIAGNOSIS — D61818 Other pancytopenia: Secondary | ICD-10-CM

## 2019-12-16 DIAGNOSIS — Z800 Family history of malignant neoplasm of digestive organs: Secondary | ICD-10-CM

## 2019-12-16 LAB — CMP (CANCER CENTER ONLY)
ALT: 39 U/L (ref 0–44)
AST: 31 U/L (ref 15–41)
Albumin: 4.1 g/dL (ref 3.5–5.0)
Alkaline Phosphatase: 84 U/L (ref 38–126)
Anion gap: 8 (ref 5–15)
BUN: 22 mg/dL (ref 8–23)
CO2: 24 mmol/L (ref 22–32)
Calcium: 9.5 mg/dL (ref 8.9–10.3)
Chloride: 99 mmol/L (ref 98–111)
Creatinine: 1.09 mg/dL — ABNORMAL HIGH (ref 0.44–1.00)
GFR, Est AFR Am: 52 mL/min — ABNORMAL LOW (ref >60)
GFR, Estimated: 45 mL/min — ABNORMAL LOW (ref >60)
Glucose, Bld: 101 mg/dL — ABNORMAL HIGH (ref 70–99)
Potassium: 5 mmol/L (ref 3.5–5.1)
Sodium: 131 mmol/L — ABNORMAL LOW (ref 135–145)
Total Bilirubin: 0.3 mg/dL (ref 0.3–1.2)
Total Protein: 7.9 g/dL (ref 6.5–8.1)

## 2019-12-16 LAB — CBC WITH DIFFERENTIAL/PLATELET
Abs Immature Granulocytes: 0.03 10*3/uL (ref 0.00–0.07)
Basophils Absolute: 0 10*3/uL (ref 0.0–0.1)
Basophils Relative: 0 %
Eosinophils Absolute: 0 10*3/uL (ref 0.0–0.5)
Eosinophils Relative: 0 %
HCT: 28.4 % — ABNORMAL LOW (ref 36.0–46.0)
Hemoglobin: 9.3 g/dL — ABNORMAL LOW (ref 12.0–15.0)
Immature Granulocytes: 1 %
Lymphocytes Relative: 34 %
Lymphs Abs: 0.9 10*3/uL (ref 0.7–4.0)
MCH: 34.7 pg — ABNORMAL HIGH (ref 26.0–34.0)
MCHC: 32.7 g/dL (ref 30.0–36.0)
MCV: 106 fL — ABNORMAL HIGH (ref 80.0–100.0)
Monocytes Absolute: 0.9 10*3/uL (ref 0.1–1.0)
Monocytes Relative: 34 %
Neutro Abs: 0.8 10*3/uL — ABNORMAL LOW (ref 1.7–7.7)
Neutrophils Relative %: 31 %
Platelets: 111 10*3/uL — ABNORMAL LOW (ref 150–400)
RBC: 2.68 MIL/uL — ABNORMAL LOW (ref 3.87–5.11)
RDW: 14.9 % (ref 11.5–15.5)
WBC: 2.6 10*3/uL — ABNORMAL LOW (ref 4.0–10.5)
nRBC: 0 % (ref 0.0–0.2)

## 2019-12-16 LAB — RETIC PANEL
Immature Retic Fract: 13.3 % (ref 2.3–15.9)
RBC.: 2.68 MIL/uL — ABNORMAL LOW (ref 3.87–5.11)
Retic Count, Absolute: 87.4 10*3/uL (ref 19.0–186.0)
Retic Ct Pct: 3.3 % — ABNORMAL HIGH (ref 0.4–3.1)
Reticulocyte Hemoglobin: 39.4 pg (ref >27.9)

## 2019-12-16 NOTE — Patient Instructions (Signed)
Thank you for choosing [facility] to provide your oncology and hematology care.   Should you have questions after your visit to the [facility] ([facility]), please contact this office at [phone] between 8:30 AM and 4:30 PM.  Voice mails left after 4:00 PM may not be returned until the following business day.  Calls received after 4:30 PM will be answered by an off-site Nurse Triage Line.    Prescription Refills:  Please have your pharmacy contact us directly for most prescription requests.  Contact the office directly for refills of narcotics (pain medications). Allow 48-72 hours for refills.  Appointments: Please contact the [facility] scheduling department [phone] for questions regarding [facility] appointment scheduling.  Contact the schedulers with any scheduling changes so that your appointment can be rescheduled in a timely manner.   Central Scheduling for  [phone] - Call to schedule procedures such as PET scans, CT scans, MRI, Ultrasound, etc.  To afford each patient quality time with our providers, please arrive 30 minutes before your scheduled appointment time.  If you arrive late for your appointment, you may be asked to reschedule.  We strive to give you quality time with our providers, and arriving late affects you and other patients whose appointments are after yours. If you are a no show for multiple scheduled visits, you may be dismissed from the clinic at the providers discretion.     Resources: [facility] Social Workers 336-832-0950 for additional information on assistance programs or assistance connecting with community support programs   [place] DSS  336-641-3447: Information regarding food stamps, Medicaid, and utility assistance SCAT 336-333-6589   [place] [employer]'s shared-ride transportation service for eligible riders who have a disability that prevents them from riding the fixed route bus.   Medicare Rights Center  800-333-4114 Helps people with Medicare understand their rights and benefits, navigate the Medicare system, and secure the quality healthcare they deserve [employer] 800-227-2345 Assists patients locate various types of support and financial assistance Cancer Care: 1-800-813-HOPE (4673) Provides financial assistance, online support groups, medication/co-pay assistance.   Transportation Assistance for appointments at [facility]: Transportation Coordinator [phone]  Again, thank you for choosing  Cancer Center for your care.       

## 2019-12-17 ENCOUNTER — Telehealth: Payer: Self-pay | Admitting: *Deleted

## 2019-12-17 DIAGNOSIS — F329 Major depressive disorder, single episode, unspecified: Secondary | ICD-10-CM | POA: Diagnosis not present

## 2019-12-17 DIAGNOSIS — K5900 Constipation, unspecified: Secondary | ICD-10-CM | POA: Diagnosis not present

## 2019-12-17 DIAGNOSIS — D509 Iron deficiency anemia, unspecified: Secondary | ICD-10-CM | POA: Diagnosis not present

## 2019-12-17 DIAGNOSIS — L03115 Cellulitis of right lower limb: Secondary | ICD-10-CM | POA: Diagnosis not present

## 2019-12-17 DIAGNOSIS — E039 Hypothyroidism, unspecified: Secondary | ICD-10-CM | POA: Diagnosis not present

## 2019-12-17 DIAGNOSIS — D61818 Other pancytopenia: Secondary | ICD-10-CM | POA: Diagnosis not present

## 2019-12-17 LAB — KAPPA/LAMBDA LIGHT CHAINS
Kappa free light chain: 42.2 mg/L — ABNORMAL HIGH (ref 3.3–19.4)
Kappa, lambda light chain ratio: 1.04 (ref 0.26–1.65)
Lambda free light chains: 40.7 mg/L — ABNORMAL HIGH (ref 5.7–26.3)

## 2019-12-17 LAB — HAPTOGLOBIN: Haptoglobin: 132 mg/dL (ref 41–333)

## 2019-12-17 LAB — LACTATE DEHYDROGENASE: LDH: 189 U/L (ref 98–192)

## 2019-12-17 NOTE — Telephone Encounter (Signed)
Patient called. She wants to delay scheduling bone marrow bx until she has spoken with Dr. Corbin Ade (currently out of office). She said she'd call the office once she makes a decision. Dr.Kale informed

## 2019-12-18 LAB — COPPER, SERUM: Copper: 154 ug/dL (ref 80–158)

## 2019-12-21 LAB — MULTIPLE MYELOMA PANEL, SERUM
Albumin SerPl Elph-Mcnc: 3.9 g/dL (ref 2.9–4.4)
Albumin/Glob SerPl: 1.1 (ref 0.7–1.7)
Alpha 1: 0.3 g/dL (ref 0.0–0.4)
Alpha2 Glob SerPl Elph-Mcnc: 0.7 g/dL (ref 0.4–1.0)
B-Globulin SerPl Elph-Mcnc: 1 g/dL (ref 0.7–1.3)
Gamma Glob SerPl Elph-Mcnc: 1.7 g/dL (ref 0.4–1.8)
Globulin, Total: 3.8 g/dL (ref 2.2–3.9)
IgA: 270 mg/dL (ref 64–422)
IgG (Immunoglobin G), Serum: 1530 mg/dL (ref 586–1602)
IgM (Immunoglobulin M), Srm: 214 mg/dL (ref 26–217)
Total Protein ELP: 7.7 g/dL (ref 6.0–8.5)

## 2019-12-23 NOTE — Addendum Note (Signed)
Addended by: Sullivan Lone on: 12/23/2019 11:22 AM   Modules accepted: Orders

## 2019-12-28 ENCOUNTER — Other Ambulatory Visit: Payer: Self-pay | Admitting: *Deleted

## 2019-12-28 ENCOUNTER — Telehealth: Payer: Self-pay | Admitting: Hematology

## 2019-12-28 ENCOUNTER — Encounter: Payer: Self-pay | Admitting: *Deleted

## 2019-12-28 NOTE — Patient Outreach (Signed)
[facility]) Care Management [facility] CM Telephone Outreach  PCP office completes Transition of Care follow up post-hospital discharge Post-hospital discharge day # 18 without unplanned hospital re-admission  12/28/2019  Alison Bridges 1928/08/18 [id number]  Successful telephone outreach to [facility], 86 y/o female referred to [facility] RN CM on Dec 13, 2019 by [facility] CMA for EMMI Red-Alert notification, as above; patient was recently hospitalized May 5-7, 2021 for (R) lower extremity edema/ cellulitis and pancytopenia possibly induced by a spider bite.  Patient has history including, but not limited to, hypothyroidism, depression, and osteoporosis.  She was discharged home to self-care without home health services.  HIPAA/ identity verified and patient reports "things going well;" she denies pain and new/ recent falls and sounds to be in no distress throughout call today.  Reports that she continues driving self to errands/ provider appointments; states she is considering moving into an independent living facility, possibly [place], this summer.  Continues to deny community resource needs.  Denies clinical concerns today and states she believes she is "slowly getting better" after recent hospitalization- reports leg swelling "gone, no redness, feels better."  Patient further reports:  -- Has all medicationsand takes as prescribed;denies questions/ concerns, recent changes to current medications; continues self-managing medications  -- has attended all recent provider appointments; states new oncology provider has recommended bone marrow biopsy which is pending; states that she is waiting for the procedure to be scheduled and expects to hear from scheduling staff "soon;" confirms that she has contact information for oncology provider and understands to contact staff if she has questions about scheduling process; denies care coordination needs around same  Self-health management of  newly diagnosed pancytopenia: -- reports ongoing independence in all self-care for ADL's and iADL's -- continues to verbalize uncertainty around new diagnosis, states that "I just have to wait to see what the bone marrow biopsy shows;" reports "feels some better" after visiting with new oncology provider -- confirmed that patient is able to verbalize signs/ symptoms infection/ cellulitis and has no current concerns around same -- confirmed that patient understands to promptly contact care providers for any new concerns/ issues/ problems that arise  Patient denies further issues, concerns, or problems today. I confirmed that patient hasmy direct phone number, the main [facility] CM office phone number, and the [facility] CM 24-hour nurse advice phone number should issues arise prior to next scheduled [facility] CM outreach.  Encouraged patient to contact me directly if needs, questions, issues, or concerns arise prior to next scheduled outreach next month; patient agreed to do so.  Plan:  Patient will take medications as prescribed and will attend all scheduled provider appointments  Patient will promptly notify care providers for any new concerns/ issues/ problems that arise  I will share today's [facility] CM notes and care plan with patient's PCP as [facility] CM initial assessment  I will place printed educational material in mail to patient around bone marrow biopsy procedure and patient will review  [facility] RN CM outreach to continue with scheduled phone call next month, unless indicated sooner  [facility] CM Care Plan Problem One     Most Recent Value  Care Plan Problem One  High risk for hospital re-admission related to/ as evidenced by recent hospitalization for lower extremity cellulitis with new diagnosis of pancytopenia in patient who lives alone  Role Documenting the Problem One  Care Management [place] for Problem One  Active  THN Long Term Goal   Over the  next 31 days, patient will not experience  hospital re-admission, as evidenced by patient reporting and review of EHR during [facility] RN CM outreach  [place] Term Goal Start Date  12/28/19 [Goal extended around patient preference for next outreach]  Interventions for Problem One Long Term Goal  Discussed with patient her current clinical condition and confirmed that she believes she is slowly recuperating from her recent hospitalization,  completed [facility] CM initial assessment and shared with PCP,  confirmed that patient has had no recent changes to medications and continues self-managing medications  [facility] CM Short Term Goal #1   Over the next 30 days, patient will attend all scheduled provider appointments, as evidenced by patient reporting, review of EHR, and collaboration with care providers as indicated during [facility] RN CM outreach  [facility] CM Short Term Goal #1 Start Date  12/28/19 [Goal re-established/ extended- patient outreach preference]  Interventions for Short Term Goal #1  Confirmed that patient has attended all recently scheduled provider office visits,  confirmed that she continues to transport/ drive self,  reviewed recent visits and upcoming visits and confirmed that patient has accurate understanding of all with plans to attend all as scheduled  [facility] CM Short Term Goal #2   Over the next 30 days, patient will complete bone marrow biopsy as evidenced by patient reporting and review of EHR during [facility] RN CM outreach  [facility] CM Short Term Goal #2 Start Date  12/28/19  Interventions for Short Term Goal #2  Confirmed that patient is able to verbalize plan of care to address newly diagnosed pancytopenia and that she has contact information for oncology provider,  confirmed that patient expects to hear from scheduling staff soon around upcoming biopsy; placed printed EMMI educational material around bone marrow biopsy in mail to patient     Oneta Rack, RN, BSN, [employer] Coordinator [facility] Care Management  [phone]

## 2019-12-28 NOTE — Telephone Encounter (Signed)
Scheduled appt per 5/24 schmessage - pt is aware.

## 2020-01-05 ENCOUNTER — Telehealth: Payer: Self-pay | Admitting: Hematology

## 2020-01-05 NOTE — Progress Notes (Signed)
HEMATOLOGY/ONCOLOGY CONSULTATION NOTE  Date of Service: 01/06/2020  Patient Care Team: Burnard Bunting, MD as PCP - General (Internal Medicine) Knox Royalty, RN as [name]  REFERRING PHYSICIAN: Burnard Bunting, MD  CHIEF COMPLAINTS/PURPOSE OF CONSULTATION:  Pancytopenia  HISTORY OF PRESENTING ILLNESS:  I connected with Alison Bridges on 01/06/20 at  9:00 AM EDT and verified that I am speaking with the correct person using two identifiers.   I discussed the limitations, risks, security and privacy concerns of performing an evaluation and management service by telemedicine and the availability of in-person appointments. I also discussed with the patient that there may be a patient responsible charge related to this service. The patient expressed understanding and agreed to proceed.   Other persons participating in the visit and their role in the encounter:     -Dawayne Cirri, Medical Scribe  Patient's location: Home  Provider's location: [place] [facility] [employer] is a wonderful 86 y.o. female who has been referred to [place] by Burnard Bunting, MD for evaluation and management of Pancytopenia. The patient's last visit with [place] was on 12/16/19. The pt reports that she is doing well overall.  The pt reports she is good. She is scheduled for her bone marrow biopsy and aspiration on 01/10/20. Pt has been having lower abdomen discomfort and tenderness that feels like an ache. She has been having darker stool due to iron supplement. Pt had a CT scan of the abdomen about 6 weeks ago and there were no concerns.   Lab results today (12/16/19) of CBC w/diff and CMP is as follows: all values are WNL except for WBC at 2.6K, RBC at 2.68, Hemoglobin at 9.3, HCT at 28.4, MCV at 106.0, MCH at 34.7, Platelets at 111K, Neutro Abs at 0.8K, Sodium at 131, Glucose at 101, Creatinine at 1, GFR, Est Non Af Am at 45, GFR, Est AFR Am at 52 12/16/19 of Retic Panel is as  follows: all values are WNL except for Retic Ct Pct at 3.3, RBC at 2.68 12/16/19 of Copper, Serum at 154: WNL 12/16/19 of Kappa/Lambda Light Chains is as follows: all values are WNL except for Kappa Free Light Chain at 42.2, Lamda Free Light Chains at 40.7 12/16/19 of MMP is as follows: all values are WNL 12/16/19 of Haptoglobin at 132: WNL 12/16/19 of LDH at 189: WNL 12/16/19 of JAK2 (including V617 and Exon 12), MPL, and CALR-Next Generation Sequencing is as follows: all values are WNL except for pending 12/16/19 of Flow Cytometry is as follows: all values are WNL except for pending  On review of systems, pt reports discomfort and tenderness in lower abdomen and denies constipation, irregular bowl movements, changes in stool, blood/black stool, urinary changes, discomfort passing urine and any other symptoms.   MEDICAL HISTORY:  Past Medical History:  Diagnosis Date  . Depression   . Hypothyroidism   . Osteoporosis    SURGICAL HISTORY: Past Surgical History:  Procedure Laterality Date  . ABDOMINAL HYSTERECTOMY    . APPENDECTOMY  1998  . CHOLECYSTECTOMY  1998   SOCIAL HISTORY: Social History   Socioeconomic History  . Marital status: Widowed    Spouse name: Not on file  . Number of children: Not on file  . Years of education: Not on file  . Highest education level: Not on file  Occupational History  . Occupation: Oncologist    Comment: Retired  Tobacco Use  . Smoking status: Never  Smoker  . Smokeless tobacco: Never Used  Substance and Sexual Activity  . Alcohol use: No  . Drug use: No  . Sexual activity: Not on file  Other Topics Concern  . Not on file  Social History Narrative   She is tried to stay active --  2 days a week she does senior aerobics.  She also enjoys going for walks, but no standard routine.      She has 3 children and 2 grandchildren.   Social Determinants of Health   Financial Resource Strain:   . Difficulty of Paying Living  Expenses:   Food Insecurity: No Food Insecurity  . Worried About Charity fundraiser in the Last Year: Never true  . Ran Out of Food in the Last Year: Never true  Transportation Needs: No Transportation Needs  . Lack of Transportation (Medical): No  . Lack of Transportation (Non-Medical): No  Physical Activity:   . Days of Exercise per Week:   . Minutes of Exercise per Session:   Stress:   . Feeling of Stress :   Social Connections:   . Frequency of Communication with Friends and Family:   . Frequency of Social Gatherings with Friends and Family:   . Attends Religious Services:   . Active Member of Clubs or Organizations:   . Attends Archivist Meetings:   Marland Kitchen Marital Status:   Intimate Partner Violence:   . Fear of Current or Ex-Partner:   . Emotionally Abused:   Marland Kitchen Physically Abused:   . Sexually Abused:      FAMILY HISTORY: Family History  Problem Relation Age of Onset  . Heart attack Mother 42  . Pancreatic cancer Father   . Parkinson's disease Sister   . Diabetes Brother        Multiple complications  . Heart disease Brother        Not sure what happened  . Dementia Brother      ALLERGIES:   has No Known Allergies.   MEDICATIONS:  Current Outpatient Medications  Medication Sig Dispense Refill  . cholecalciferol (VITAMIN D) 1000 UNITS tablet Take 1,000 Units by mouth daily.    . ferrous sulfate 325 (65 FE) MG tablet Take 1 tablet (325 mg total) by mouth daily. 30 tablet 1  . fluticasone (FLONASE) 50 MCG/ACT nasal spray Place 1-2 sprays into both nostrils daily as needed for allergies or rhinitis.    . hydrocortisone cream 1 % Apply 1 application topically 2 (two) times daily as needed (swollen/red foot).    Marland Kitchen ibuprofen (ADVIL) 200 MG tablet Take 200 mg by mouth every 6 (six) hours as needed for headache or moderate pain.    Marland Kitchen levothyroxine (SYNTHROID, LEVOTHROID) 125 MCG tablet Take 125 mcg by mouth daily before breakfast.    . loratadine (CLARITIN) 10  MG tablet Take 10 mg by mouth daily.    Marland Kitchen MELATONIN PO Take 1 tablet by mouth at bedtime.    . Multiple Vitamin (MULTIVITAMIN WITH MINERALS) TABS tablet Take 1 tablet by mouth daily.    . Multiple Vitamins-Minerals (ZINC PO) Take 1 tablet by mouth daily.    . pantoprazole (PROTONIX) 40 MG tablet Take 40 mg by mouth daily.    . polyethylene glycol (MIRALAX) 17 g packet Take 17 g by mouth daily as needed for mild constipation.    . sertraline (ZOLOFT) 50 MG tablet Take 50 mg by mouth daily.     No current facility-administered medications for this visit.  REVIEW OF SYSTEMS:   A 10+ POINT REVIEW OF SYSTEMS WAS OBTAINED including neurology, dermatology, psychiatry, cardiac, respiratory, lymph, extremities, GI, GU, Musculoskeletal, constitutional, breasts, reproductive, HEENT.  All pertinent positives are noted in the HPI.  All others are negative.   PHYSICAL EXAMINATION: ECOG PERFORMANCE STATUS: 2 - Symptomatic, <50% confined to bed  There were no vitals filed for this visit. There were no vitals filed for this visit. There is no height or weight on file to calculate BMI.  Tele-Health Visit   LABORATORY DATA:  I have reviewed the data as listed  CBC Latest Ref Rng & Units 12/16/2019 12/10/2019 12/09/2019  WBC 4.0 - 10.5 K/uL 2.6(L) 2.9(L) 3.4(L)  Hemoglobin 12.0 - 15.0 g/dL 9.3(L) 8.2(L) 8.1(L)  Hematocrit 36.0 - 46.0 % 28.4(L) 25.1(L) 25.7(L)  Platelets 150 - 400 K/uL 111(L) 66(L) 71(L)    CMP Latest Ref Rng & Units 12/16/2019 12/10/2019 12/09/2019  Glucose 70 - 99 mg/dL 101(H) 105(H) 104(H)  BUN 8 - 23 mg/dL 22 14 13   Creatinine 0.44 - 1.00 mg/dL 1.09(H) 0.94 0.91  Sodium 135 - 145 mmol/L 131(L) 136 134(L)  Potassium 3.5 - 5.1 mmol/L 5.0 3.7 3.8  Chloride 98 - 111 mmol/L 99 105 104  CO2 22 - 32 mmol/L 24 22 22   Calcium 8.9 - 10.3 mg/dL 9.5 8.4(L) 8.2(L)  Total Protein 6.5 - 8.1 g/dL 7.9 - -  Total Bilirubin 0.3 - 1.2 mg/dL 0.3 - -  Alkaline Phos 38 - 126 U/L 84 - -  AST 15 - 41  U/L 31 - -  ALT 0 - 44 U/L 39 - -     RADIOGRAPHIC STUDIES: I have personally reviewed the radiological images as listed and agreed with the findings in the report. DG Ankle Complete Right  Result Date: 12/08/2019 CLINICAL DATA:  Ankle  swelling.  Possible insect bite EXAM: RIGHT ANKLE - COMPLETE 3+ VIEW COMPARISON:  None. FINDINGS: There is no evidence of fracture, dislocation, or joint effusion. Osteopenia. Nonspecific soft tissue swelling about the ankle IMPRESSION: Soft tissue swelling without acute osseous finding. Electronically Signed   By: Monte Fantasia M.D.   On: 12/08/2019 10:28   VAS [place] LOWER EXTREMITY VENOUS (DVT)  Result Date: 12/09/2019  Lower Venous DVTStudy Indications: Swelling.  Risk Factors: None identified. Comparison Study: No prior studies. Performing Technologist: Oliver Hum RVT  Examination Guidelines: A complete evaluation includes B-mode imaging, spectral Doppler, color Doppler, and power Doppler as needed of all accessible portions of each vessel. Bilateral testing is considered an integral part of a complete examination. Limited examinations for reoccurring indications may be performed as noted. The reflux portion of the exam is performed with the patient in reverse Trendelenburg.  +---------+---------------+---------+-----------+----------+--------------+ RIGHT    CompressibilityPhasicitySpontaneityPropertiesThrombus Aging +---------+---------------+---------+-----------+----------+--------------+ CFV      Full           Yes      Yes                                 +---------+---------------+---------+-----------+----------+--------------+ SFJ      Full                                                        +---------+---------------+---------+-----------+----------+--------------+ FV Prox  Full                                                        +---------+---------------+---------+-----------+----------+--------------+  FV Mid   Full                                                         +---------+---------------+---------+-----------+----------+--------------+ FV DistalFull                                                        +---------+---------------+---------+-----------+----------+--------------+ PFV      Full                                                        +---------+---------------+---------+-----------+----------+--------------+ POP      Full           Yes      Yes                                 +---------+---------------+---------+-----------+----------+--------------+ PTV      Full                                                        +---------+---------------+---------+-----------+----------+--------------+ PERO     Full                                                        +---------+---------------+---------+-----------+----------+--------------+   +----+---------------+---------+-----------+----------+--------------+ LEFTCompressibilityPhasicitySpontaneityPropertiesThrombus Aging +----+---------------+---------+-----------+----------+--------------+ CFV Full           Yes      Yes                                 +----+---------------+---------+-----------+----------+--------------+     Summary: RIGHT: - There is no evidence of deep vein thrombosis in the lower extremity.  - No cystic structure found in the popliteal fossa.  LEFT: - No evidence of common femoral vein obstruction.  *See table(s) above for measurements and observations. Electronically signed by Servando Snare MD on 12/09/2019 at 5:28:11 PM.    Final      ASSESSMENT & PLAN:  MADHURI VACCA is a 86 y.o. female with:  1. Pancytopenia 2.  PLAN: -Discussed pt labwork today, 12/16/19;  of CBC w/diff and CMP is as follows: all values are WNL except for WBC at 2.6K, RBC at 2.68, Hemoglobin at 9.3, HCT at 28.4, MCV at 106.0, MCH at 34.7, Platelets at 111K, Neutro Abs at 0.8K, Sodium at 131, Glucose at 101,  Creatinine at 1., GFR, Est Non Af Am at 45, GFR, Est AFR Am at 52 -Discussed 12/16/19 of Retic Panel is as follows: all values are WNL except for Retic Ct Pct at 3.3, RBC at 2.68 -Discussed  12/16/19 of Copper, Serum at 154: WNL -Discussed 12/16/19 of Kappa/Lambda Light Chains is as follows: all values are WNL except for Kappa Free Light Chain at 42.2, Lamda Free Light Chains at 40.7 -Discussed 12/16/19 of MMP is as follows: all values are WNL -Discussed 12/16/19 of Haptoglobin at 132: WNL -Discussed 12/16/19 of LDH at 189: WNL -Discussed 12/16/19 of JAK2 (including V617 and Exon 12), MPL, and CALR-Next Generation Sequencingis as follows: all values are WNL except for pending -Discussed 12/16/19 of Flow Cytometry is as follows: all values are WNL except for pending -Advised on regular counts for RBC, WBC and platelets  -Advised of myelodysplasia -genetic changes in bone marrow due to age  -Advised on acute leukemia  -Advised on bone marrow biopsy and labs -Advised on MDS- change in bone marrow with age, genetic mutation in stem cells, changes in blood cells -Advised no cancer being involved based on blood test- blood cells are larger in size-will need bone marrow biopsy -Advised unlikely caused by nutrition  -Advised on lower abdominal pain -pt can get scan or f/u with PCP -Advised on oral iron vs IV iron  -Recommends continue taking multi-vitamin -Recommends avoiding crowd exposure -Pt is scheduled for Bone Marrow Biopsy and Aspiration on 01/10/20 -Will see back after bone marrow biopsy   FOLLOW UP CT bone marrow aspiration and biopsy as scheduled on 6/7 Plz schedule clinic visit with Dr Irene Limbo in 4-5 days after BM Bx  The total time spent in the appt was 20 minutes and more than 50% was on counseling and direct patient cares.  All of the patient's questions were answered with apparent satisfaction. The patient knows to call the clinic with any problems, questions or concerns.  Sullivan Lone MD MS AAHIVMS [facility] Hematology/Oncology Physician [facility]  (Office):       [phone] (Work cell):  [phone] (Fax):           [phone]  01/06/2020 11:11 AM  I, Dawayne Cirri am acting as a Education administrator for Dr. Sullivan Lone.   .I have reviewed the above documentation for accuracy and completeness, and I agree with the above. [name] MD

## 2020-01-05 NOTE — Telephone Encounter (Signed)
Called and left a message to verify telephone visit for pre reg

## 2020-01-05 NOTE — Telephone Encounter (Signed)
Contacted patient to verify telephone visit for pre reg °

## 2020-01-06 ENCOUNTER — Inpatient Hospital Stay: Payer: Medicare Other | Attending: Hematology | Admitting: Hematology

## 2020-01-06 ENCOUNTER — Other Ambulatory Visit: Payer: Self-pay | Admitting: Student

## 2020-01-06 DIAGNOSIS — Z791 Long term (current) use of non-steroidal anti-inflammatories (NSAID): Secondary | ICD-10-CM | POA: Diagnosis not present

## 2020-01-06 DIAGNOSIS — E039 Hypothyroidism, unspecified: Secondary | ICD-10-CM | POA: Diagnosis not present

## 2020-01-06 DIAGNOSIS — F329 Major depressive disorder, single episode, unspecified: Secondary | ICD-10-CM | POA: Diagnosis not present

## 2020-01-06 DIAGNOSIS — D61818 Other pancytopenia: Secondary | ICD-10-CM | POA: Diagnosis not present

## 2020-01-06 DIAGNOSIS — Z79899 Other long term (current) drug therapy: Secondary | ICD-10-CM | POA: Insufficient documentation

## 2020-01-06 DIAGNOSIS — K219 Gastro-esophageal reflux disease without esophagitis: Secondary | ICD-10-CM | POA: Insufficient documentation

## 2020-01-07 ENCOUNTER — Other Ambulatory Visit: Payer: Self-pay | Admitting: Radiology

## 2020-01-10 ENCOUNTER — Ambulatory Visit (HOSPITAL_COMMUNITY)
Admission: RE | Admit: 2020-01-10 | Discharge: 2020-01-10 | Disposition: A | Discharge status: Home / Self Care | Payer: Medicare Other | Source: Ambulatory Visit | Attending: Hematology | Admitting: Hematology

## 2020-01-10 ENCOUNTER — Other Ambulatory Visit: Payer: Self-pay

## 2020-01-10 ENCOUNTER — Observation Stay (HOSPITAL_COMMUNITY)
Admission: RE | Admit: 2020-01-10 | Discharge: 2020-01-10 | Disposition: A | Discharge status: Home / Self Care | Payer: Medicare Other | Source: Ambulatory Visit | Attending: Hematology | Admitting: Hematology

## 2020-01-10 DIAGNOSIS — E039 Hypothyroidism, unspecified: Secondary | ICD-10-CM | POA: Diagnosis not present

## 2020-01-10 DIAGNOSIS — D61818 Other pancytopenia: Secondary | ICD-10-CM | POA: Insufficient documentation

## 2020-01-10 DIAGNOSIS — F329 Major depressive disorder, single episode, unspecified: Secondary | ICD-10-CM | POA: Insufficient documentation

## 2020-01-10 DIAGNOSIS — Z79899 Other long term (current) drug therapy: Secondary | ICD-10-CM | POA: Insufficient documentation

## 2020-01-10 DIAGNOSIS — Z79890 Hormone replacement therapy (postmenopausal): Secondary | ICD-10-CM | POA: Insufficient documentation

## 2020-01-10 LAB — CBC WITH DIFFERENTIAL/PLATELET
Abs Immature Granulocytes: 0.03 10*3/uL (ref 0.00–0.07)
Basophils Absolute: 0 10*3/uL (ref 0.0–0.1)
Basophils Relative: 0 %
Eosinophils Absolute: 0 10*3/uL (ref 0.0–0.5)
Eosinophils Relative: 1 %
HCT: 31.8 % — ABNORMAL LOW (ref 36.0–46.0)
Hemoglobin: 10.1 g/dL — ABNORMAL LOW (ref 12.0–15.0)
Immature Granulocytes: 1 %
Lymphocytes Relative: 38 %
Lymphs Abs: 0.9 10*3/uL (ref 0.7–4.0)
MCH: 34.9 pg — ABNORMAL HIGH (ref 26.0–34.0)
MCHC: 31.8 g/dL (ref 30.0–36.0)
MCV: 110 fL — ABNORMAL HIGH (ref 80.0–100.0)
Monocytes Absolute: 0.9 10*3/uL (ref 0.1–1.0)
Monocytes Relative: 39 %
Neutro Abs: 0.5 10*3/uL — ABNORMAL LOW (ref 1.7–7.7)
Neutrophils Relative %: 21 %
Platelets: 93 10*3/uL — ABNORMAL LOW (ref 150–400)
RBC: 2.89 MIL/uL — ABNORMAL LOW (ref 3.87–5.11)
RDW: 15.9 % — ABNORMAL HIGH (ref 11.5–15.5)
WBC: 2.3 10*3/uL — ABNORMAL LOW (ref 4.0–10.5)
nRBC: 0 % (ref 0.0–0.2)

## 2020-01-10 LAB — PROTIME-INR
INR: 1.2 (ref 0.8–1.2)
Prothrombin Time: 14.2 seconds (ref 11.4–15.2)

## 2020-01-10 MED ORDER — FENTANYL CITRATE (PF) 100 MCG/2ML IJ SOLN
INTRAMUSCULAR | Status: AC | PRN
  Administered 2020-01-10 (×2): 25 ug via INTRAVENOUS

## 2020-01-10 MED ORDER — LIDOCAINE HCL (PF) 1 % IJ SOLN
INTRAMUSCULAR | Status: AC | PRN
  Administered 2020-01-10: 10 mL via INTRADERMAL

## 2020-01-10 MED ORDER — FENTANYL CITRATE (PF) 100 MCG/2ML IJ SOLN
INTRAMUSCULAR | Status: AC
  Filled 2020-01-10: qty 2

## 2020-01-10 MED ORDER — NALOXONE HCL 0.4 MG/ML IJ SOLN
INTRAMUSCULAR | Status: AC
  Filled 2020-01-10: qty 1

## 2020-01-10 MED ORDER — MIDAZOLAM HCL 2 MG/2ML IJ SOLN
INTRAMUSCULAR | Status: AC | PRN
  Administered 2020-01-10 (×2): 0.5 mg via INTRAVENOUS

## 2020-01-10 MED ORDER — MIDAZOLAM HCL 2 MG/2ML IJ SOLN
INTRAMUSCULAR | Status: AC
  Filled 2020-01-10: qty 2

## 2020-01-10 MED ORDER — FLUMAZENIL 0.5 MG/5ML IV SOLN
INTRAVENOUS | Status: AC
  Filled 2020-01-10: qty 5

## 2020-01-10 MED ORDER — SODIUM CHLORIDE 0.9 % IV SOLN: INTRAVENOUS | Status: DC

## 2020-01-10 NOTE — Procedures (Signed)
Interventional Radiology Procedure Note  Procedure: CT guided aspirate and core biopsy of right iliac bone Complications: None Recommendations: - Bedrest supine x 1 hrs - Hydrocodone PRN  Pain - Follow biopsy results  Signed,  Kendryck Lacroix K. Jordy Verba, MD   

## 2020-01-10 NOTE — Consult Note (Signed)
Chief Complaint: Pancytopenia  Referring Physician(s): [name]  Supervising Physician: Jacqulynn Cadet  Patient Status: [facility] - Out-pt  History of Present Illness: Alison Bridges is a 86 y.o. female  History of pancytopenia. Team is requesting bone marrow biopsy for further determination of possible Myelodysplastic / myeloproliferative syndrome.  Past Medical History:  Diagnosis Date  . Depression   . Hypothyroidism   . Osteoporosis     Past Surgical History:  Procedure Laterality Date  . ABDOMINAL HYSTERECTOMY    . APPENDECTOMY  1998  . CHOLECYSTECTOMY  1998    Allergies: Patient has no known allergies.  Medications: Prior to Admission medications   Medication Sig Start Date End Date Taking? Authorizing Provider  cholecalciferol (VITAMIN D) 1000 UNITS tablet Take 1,000 Units by mouth daily.    [provider]  ferrous sulfate 325 (65 FE) MG tablet Take 1 tablet (325 mg total) by mouth daily. 12/10/19 02/08/20  Shelly Coss, MD  fluticasone (FLONASE) 50 MCG/ACT nasal spray Place 1-2 sprays into both nostrils daily as needed for allergies or rhinitis.    [provider]  hydrocortisone cream 1 % Apply 1 application topically 2 (two) times daily as needed (swollen/red foot).    [provider]  ibuprofen (ADVIL) 200 MG tablet Take 200 mg by mouth every 6 (six) hours as needed for headache or moderate pain.    [provider]  levothyroxine (SYNTHROID, LEVOTHROID) 125 MCG tablet Take 125 mcg by mouth daily before breakfast.    [provider]  loratadine (CLARITIN) 10 MG tablet Take 10 mg by mouth daily.    [provider]  MELATONIN PO Take 1 tablet by mouth at bedtime.    [provider]  Multiple Vitamin (MULTIVITAMIN WITH MINERALS) TABS tablet Take 1 tablet by mouth daily.    [provider]  Multiple Vitamins-Minerals (ZINC PO) Take 1 tablet by mouth daily.    [provider]    pantoprazole (PROTONIX) 40 MG tablet Take 40 mg by mouth daily.    [provider]  polyethylene glycol (MIRALAX) 17 g packet Take 17 g by mouth daily as needed for mild constipation.    [provider]  sertraline (ZOLOFT) 50 MG tablet Take 50 mg by mouth daily.    [provider]     Family History  Problem Relation Age of Onset  . Heart attack Mother 66  . Pancreatic cancer Father   . Parkinson's disease Sister   . Diabetes Brother        Multiple complications  . Heart disease Brother        Not sure what happened  . Dementia Brother     Social History   Socioeconomic History  . Marital status: Widowed    Spouse name: Not on file  . Number of children: Not on file  . Years of education: Not on file  . Highest education level: Not on file  Occupational History  . Occupation: Oncologist    Comment: Retired  Tobacco Use  . Smoking status: Never Smoker  . Smokeless tobacco: Never Used  Substance and Sexual Activity  . Alcohol use: No  . Drug use: No  . Sexual activity: Not on file  Other Topics Concern  . Not on file  Social History Narrative   She is tried to stay active --  2 days a week she does senior aerobics.  She also enjoys going for walks, but no standard routine.  She has 3 children and 2 grandchildren.   Social Determinants of Health   Financial Resource Strain:   . Difficulty of Paying Living Expenses:   Food Insecurity: No Food Insecurity  . Worried About Charity fundraiser in the Last Year: Never true  . Ran Out of Food in the Last Year: Never true  Transportation Needs: No Transportation Needs  . Lack of Transportation (Medical): No  . Lack of Transportation (Non-Medical): No  Physical Activity:   . Days of Exercise per Week:   . Minutes of Exercise per Session:   Stress:   . Feeling of Stress :   Social Connections:   . Frequency of Communication with Friends and Family:   . Frequency of Social  Gatherings with Friends and Family:   . Attends Religious Services:   . Active Member of Clubs or Organizations:   . Attends Archivist Meetings:   Marland Kitchen Marital Status:     Review of Systems: A 12 point ROS discussed and pertinent positives are indicated in the HPI above.  All other systems are negative.  Review of Systems  Constitutional: Negative for fatigue and fever.  HENT: Negative for congestion.   Respiratory: Negative for cough and shortness of breath.   Gastrointestinal: Negative for abdominal pain, diarrhea, nausea and vomiting.    Vital Signs: There were no vitals taken for this visit.  Physical Exam Vitals and nursing note reviewed.  Constitutional:      Appearance: She is well-developed.  HENT:     Head: Normocephalic and atraumatic.  Eyes:     Conjunctiva/sclera: Conjunctivae normal.  Cardiovascular:     Rate and Rhythm: Normal rate and regular rhythm.     Heart sounds: Normal heart sounds.  Pulmonary:     Effort: Pulmonary effort is normal.     Breath sounds: Normal breath sounds.  Musculoskeletal:        General: Normal range of motion.     Cervical back: Normal range of motion.  Skin:    General: Skin is warm.  Neurological:     Mental Status: She is alert and oriented to person, place, and time.     Imaging: No results found.  Labs:  CBC: Recent Labs    12/08/19 0934 12/09/19 0554 12/10/19 0526 12/16/19 1627  WBC 2.4* 3.4* 2.9* 2.6*  HGB 8.6* 8.1* 8.2* 9.3*  HCT 26.5* 25.7* 25.1* 28.4*  PLT 81* 71* 66* 111*    COAGS: No results for input(s): INR, APTT in the last 8760 hours.  BMP: Recent Labs    12/08/19 0934 12/09/19 0554 12/10/19 0526 12/16/19 1627  NA 136 134* 136 131*  K 3.9 3.8 3.7 5.0  CL 104 104 105 99  CO2 _0 GLUCOSE 172* 104* 105* 101*  BUN _1 CALCIUM 8.7* 8.2* 8.4* 9.5  CREATININE 1.06* 0.91 0.94 1.09*  GFRNONAA 47* 56* 54* 45*  GFRAA 54* >60 >60 52*    LIVER FUNCTION  TESTS: Recent Labs    06/03/19 1300 12/16/19 1627  BILITOT 0.5 0.3  AST 20 31  ALT 19 39  ALKPHOS 69 84  PROT 8.0 7.9  ALBUMIN 4.0 4.1    Assessment and Plan: 86 y.o, female outpatient. History of pancytopenia. Team is requesting bone marrow biopsy for further determination of possible Myelodysplastic / myeloproliferative syndrome.  Pertinent Imaging none  Pertinent IR History none  Pertinent Allergies NKDA  WBC is 2.3, PLT 93, Neutrophils 0.5 All  labs and medications are within acceptable parameters.  Patient is afebrile.  Risks and benefits of bone marrow biopsy was discussed with the patient and/or patient's family including, but not limited to bleeding, infection, damage to adjacent structures or low yield requiring additional tests.  All of the questions were answered and there is agreement to proceed.  Consent signed and in chart.    Thank you for this interesting consult.  I greatly enjoyed meeting Alison Bridges and look forward to participating in their care.  A copy of this report was sent to the requesting provider on this date.  Electronically Signed: Avel Peace, NP 01/10/2020, 9:04 AM   I spent a total of  40 Minutes   in face to face in clinical consultation, greater than 50% of which was counseling/coordinating care for bone marrow biopsy

## 2020-01-10 NOTE — Discharge Instructions (Signed)
Bone Marrow Aspiration and Bone Marrow Biopsy, Adult, Care After This sheet gives you information about how to care for yourself after your procedure. Your health care provider may also give you more specific instructions. If you have problems or questions, contact your health care provider. What can I expect after the procedure? After the procedure, it is common to have:  Mild pain and tenderness.  Swelling.  Bruising. Follow these instructions at home: Puncture site care   Follow instructions from your health care provider about how to take care of the puncture site. Make sure you: ? Wash your hands with soap and water before and after you change your bandage (dressing). If soap and water are not available, use hand sanitizer. ? Change your dressing as told by your health care provider.  Check your puncture site every day for signs of infection. Check for: ? More redness, swelling, or pain. ? Fluid or blood. ? Warmth. ? Pus or a bad smell. Activity  Return to your normal activities as told by your health care provider. Ask your health care provider what activities are safe for you.  Do not lift anything that is heavier than 10 lb (4.5 kg), or the limit that you are told, until your health care provider says that it is safe.  Do not drive for 24 hours if you were given a sedative during your procedure. General instructions   Take over-the-counter and prescription medicines only as told by your health care provider.  Do not take baths, swim, or use a hot tub until your health care provider approves. Ask your health care provider if you may take showers. You may only be allowed to take sponge baths.  If directed, put ice on the affected area. To do this: ? Put ice in a plastic bag. ? Place a towel between your skin and the bag. ? Leave the ice on for 20 minutes, 2-3 times a day.  Keep all follow-up visits as told by your health care provider. This is important. Contact a  health care provider if:  Your pain is not controlled with medicine.  You have a fever.  You have more redness, swelling, or pain around the puncture site.  You have fluid or blood coming from the puncture site.  Your puncture site feels warm to the touch.  You have pus or a bad smell coming from the puncture site. Summary  After the procedure, it is common to have mild pain, tenderness, swelling, and bruising.  Follow instructions from your health care provider about how to take care of the puncture site and what activities are safe for you.  Take over-the-counter and prescription medicines only as told by your health care provider.  Contact a health care provider if you have any signs of infection, such as fluid or blood coming from the puncture site. This information is not intended to replace advice given to you by your health care provider. Make sure you discuss any questions you have with your health care provider. Document Revised: 12/08/2018 Document Reviewed: 12/08/2018 Elsevier Patient Education  [place]. Moderate Conscious Sedation, Adult, Care After These instructions provide you with information about caring for yourself after your procedure. Your health care provider may also give you more specific instructions. Your treatment has been planned according to current medical practices, but problems sometimes occur. Call your health care provider if you have any problems or questions after your procedure. What can I expect after the procedure? After your procedure,  it is common:  To feel sleepy for several hours.  To feel clumsy and have poor balance for several hours.  To have poor judgment for several hours.  To vomit if you eat too soon. Follow these instructions at home: For at least 24 hours after the procedure:   Do not: ? Participate in activities where you could fall or become injured. ? Drive. ? Use heavy machinery. ? Drink alcohol. ? Take  sleeping pills or medicines that cause drowsiness. ? Make important decisions or sign legal documents. ? Take care of children on your own.  Rest. Eating and drinking  Follow the diet recommended by your health care provider.  If you vomit: ? Drink water, juice, or soup when you can drink without vomiting. ? Make sure you have little or no nausea before eating solid foods. General instructions  Have a responsible adult stay with you until you are awake and alert.  Take over-the-counter and prescription medicines only as told by your health care provider.  If you smoke, do not smoke without supervision.  Keep all follow-up visits as told by your health care provider. This is important. Contact a health care provider if:  You keep feeling nauseous or you keep vomiting.  You feel light-headed.  You develop a rash.  You have a fever. Get help right away if:  You have trouble breathing. This information is not intended to replace advice given to you by your health care provider. Make sure you discuss any questions you have with your health care provider. Document Revised: 07/04/2017 Document Reviewed: 11/11/2015 Elsevier Patient Education  2020 [employer].

## 2020-01-12 ENCOUNTER — Inpatient Hospital Stay: Payer: Medicare Other | Admitting: Hematology

## 2020-01-13 LAB — JAK2 (INCLUDING V617F AND EXON 12), MPL,& CALR-NEXT GEN SEQ

## 2020-01-17 ENCOUNTER — Other Ambulatory Visit: Payer: Self-pay

## 2020-01-17 ENCOUNTER — Encounter (INDEPENDENT_AMBULATORY_CARE_PROVIDER_SITE_OTHER): Payer: Self-pay

## 2020-01-17 ENCOUNTER — Inpatient Hospital Stay (HOSPITAL_BASED_OUTPATIENT_CLINIC_OR_DEPARTMENT_OTHER): Payer: Medicare Other | Admitting: Hematology

## 2020-01-17 VITALS — BP 142/61 | HR 89 | Temp 97.8°F | Resp 18 | Ht 68.0 in | Wt 125.5 lb

## 2020-01-17 DIAGNOSIS — D61818 Other pancytopenia: Secondary | ICD-10-CM | POA: Diagnosis not present

## 2020-01-17 DIAGNOSIS — D469 Myelodysplastic syndrome, unspecified: Secondary | ICD-10-CM

## 2020-01-17 NOTE — Progress Notes (Signed)
HEMATOLOGY/ONCOLOGY CONSULTATION NOTE  Date of Service: 01/17/2020  Patient Care Team: Burnard Bunting, MD as PCP - General (Internal Medicine) Knox Royalty, RN as [place] Management  REFERRING PHYSICIAN: Burnard Bunting, MD  CHIEF COMPLAINTS/PURPOSE OF CONSULTATION:  Pancytopenia  HISTORY OF PRESENTING ILLNESS:  Alison Bridges is a wonderful 86 y.o. female who has been referred to [place] by Burnard Bunting, MD for evaluation and management of Pancytopenia. The pt reports that she is doing well overall.   The pt reports she is good. Pt lives alone. Prior to the pandemic she was taking a workout class. About the last 3 months pt has had fatigue, loss of appetite, and weight loss. Last October pt had fatigue, SOB, and was spitting up blood. She has acid reflux and had esophagus dilated twice. A week ago pt was in the hospital due to infection in her right ankle. Her ankle started swelling and turning red. The ankle was not painful but did have burning and tightness. She was put on antibiotics and iron.  Pt uses partial dentures and does not use denture cream. She has been having a burning and hot feeling in feet and face that comes and goes. Her thyroid medication has been stable and she takes the pills several hours before eating with just water. Pt has loss about 10 pounds due to not having appetite. Recently she has had spontaneous bruising. She has had both doses of COVID19 vaccine. Pt had been taking 86m of zync for 4-6weeks prior to hospitalization.   Of note prior to the patient's visit today, pt has had DG Ankle Complete Right ([id number] completed on 12/08/19 with results revealing "Soft tissue swelling without acute osseous finding." Pt has had Lower Venous DVT Study completed on 12/09/19 with results revealing "RIGHT: There is no evidence of deep vein thrombosis in the lower extremity. No cystic structure found in the popliteal fossa. LEFT: No evidence of  common femoral vein obstruction." Pt has had CT Abdomen Pelvis W IV Contrast completed on 10/07/19 with results revealing, " 1. No acute finding 2. Areas of enhancement in the right bladder worrisome for neoplasm 3. Cholecystectomy 4. Bilateral renal cortical cysts and nonobstructive left renal calculi. 5. Aortoiliac atherosclerosis without aneurysm. 6. Hysterectomy 7. Diverticulosis without diverticulitis. 8. Lumbar DDD and DJD with degenerative anterolisthesis of L5 on S1"  Most recent lab results (12/10/19) of CBC is as follows: all values are WNL except for WBC at 2.9K, RBC at 2.35, Hemoglobin at 8.2, HCT at 25.1, MCV at 106.8, MCH at 34.9, Platelets at 66K, Neutro Abs at 0.6K, Monocytes Absolute at 1.4K, Glucose at 105, Calcium at 8.4, GFR, Calc Non Af Amer at 54 12/08/19 of Lactic Acid, Venous at 1.3 12/08/19 of CRP at 0.8: WNL 12/08/19 of Reticulocytes is as follows: all values are WNL except for RC at 2.52, Immature Retic Fract at 16.9 12/08/19 of Ferritin at 167: WNL 12/08/19 of Iron and TIBC is as follows: all values are WNL except for Iron at 27, Saturation Ratios at 10 12/08/19 of Folate at 52.3: WNL 12/08/19 of Vitamin B12 at 931 12/08/19 of Sed Rate at 60  On review of systems, pt reports fatigue, lack of appetite, burning/hot sensation in face and feet, weight loss  and denies lumps/bumps, skin rashes, bone pain, fever, chills, night sweats, changes in bowl habits and urinary habits, nose bleeds, gum bleeds, blood in urine/stool, lightheaded, dizzy, changes in breathing, back pain, constipation and any other symptoms.  INTERVAL HISTORY:   Alison Bridges is a wonderful 86 y.o. female who is here for evaluation and management of Pancytopenia. We are joined today by her son, Rush Landmark, and her daughter-in-law, Eustaquio Maize. The patient's last visit with [place] was on 01/06/2020. The pt reports that she is doing well overall.  The pt reports that the bone marrow biopsy was not too bothersome and she  has had no new concerns or symptoms. She has been having some fatigue and abdominal discomfort chronically. Her abdominal discomfort is improved after eating. There was a change in the urinary bladder visualized on a CT Abd/Pel on 03/04. Pt has since seen a Urologist and had a cystoscopy, nothing of concern was found. She has a f/u with Urology scheduled. Pt has been given Protonix to use as needed and is taking PO Iron. Pt is having constipation intermittently.   Of note since the patient's last visit, pt has had BM Bx Surgical Pathology Report ([id number]) completed on 01/10/2020 with results revealing "BONE MARROW, ASPIRATE, CLOT, CORE: - Hypercellular marrow with myeloid hyperplasia and megakaryocytic atypia  PERIPHERAL BLOOD: - Pancytopenia".   Lab results (01/10/20) of CBC w/diff is as follows: all values are WNL except for WBC at 2.3K, RBC at 2.89, Hgb at 10.1, HCT at 31.8, MCV at 110,0, MCH at 34.9, RDW at 15.9, PLT at 93K, Neutro Abs at 0.5K.   On review of systems, pt reports fatigue, bruising, abdominal discomfort, loss of appetite, weight loss, constipation and denies polyuria, dysuria, hematuria, mouth sores, nose bleeds, gum bleeds, bloody/black stools and any other symptoms.    MEDICAL HISTORY:  Past Medical History:  Diagnosis Date  . Depression   . Hypothyroidism   . Osteoporosis    SURGICAL HISTORY: Past Surgical History:  Procedure Laterality Date  . ABDOMINAL HYSTERECTOMY    . APPENDECTOMY  1998  . CHOLECYSTECTOMY  1998   SOCIAL HISTORY: Social History   Socioeconomic History  . Marital status: Widowed    Spouse name: Not on file  . Number of children: Not on file  . Years of education: Not on file  . Highest education level: Not on file  Occupational History  . Occupation: Oncologist    Comment: Retired  Tobacco Use  . Smoking status: Never Smoker  . Smokeless tobacco: Never Used  Substance and Sexual Activity  . Alcohol use: No  . Drug  use: No  . Sexual activity: Not on file  Other Topics Concern  . Not on file  Social History Narrative   She is tried to stay active --  2 days a week she does senior aerobics.  She also enjoys going for walks, but no standard routine.      She has 3 children and 2 grandchildren.   Social Determinants of Health   Financial Resource Strain:   . Difficulty of Paying Living Expenses:   Food Insecurity: No Food Insecurity  . Worried About Charity fundraiser in the Last Year: Never true  . Ran Out of Food in the Last Year: Never true  Transportation Needs: No Transportation Needs  . Lack of Transportation (Medical): No  . Lack of Transportation (Non-Medical): No  Physical Activity:   . Days of Exercise per Week:   . Minutes of Exercise per Session:   Stress:   . Feeling of Stress :   Social Connections:   . Frequency of Communication with Friends and Family:   . Frequency of Social Gatherings with Friends and Family:   .  Attends Religious Services:   . Active Member of Clubs or Organizations:   . Attends Archivist Meetings:   Marland Kitchen Marital Status:   Intimate Partner Violence:   . Fear of Current or Ex-Partner:   . Emotionally Abused:   Marland Kitchen Physically Abused:   . Sexually Abused:      FAMILY HISTORY: Family History  Problem Relation Age of Onset  . Heart attack Mother 87  . Pancreatic cancer Father   . Parkinson's disease Sister   . Diabetes Brother        Multiple complications  . Heart disease Brother        Not sure what happened  . Dementia Brother      ALLERGIES:   has No Known Allergies.   MEDICATIONS:  Current Outpatient Medications  Medication Sig Dispense Refill  . cholecalciferol (VITAMIN D) 1000 UNITS tablet Take 1,000 Units by mouth daily.    . ferrous sulfate 325 (65 FE) MG tablet Take 1 tablet (325 mg total) by mouth daily. 30 tablet 1  . fluticasone (FLONASE) 50 MCG/ACT nasal spray Place 1-2 sprays into both nostrils daily as needed for  allergies or rhinitis.    Marland Kitchen ibuprofen (ADVIL) 200 MG tablet Take 200 mg by mouth every 6 (six) hours as needed for headache or moderate pain.    Marland Kitchen levothyroxine (SYNTHROID, LEVOTHROID) 125 MCG tablet Take 125 mcg by mouth daily before breakfast.    . loratadine (CLARITIN) 10 MG tablet Take 10 mg by mouth daily.    Marland Kitchen MELATONIN PO Take 1 tablet by mouth at bedtime.    . Multiple Vitamin (MULTIVITAMIN WITH MINERALS) TABS tablet Take 1 tablet by mouth daily.    . Multiple Vitamins-Minerals (ZINC PO) Take 1 tablet by mouth daily.    . pantoprazole (PROTONIX) 40 MG tablet Take 40 mg by mouth daily.    . polyethylene glycol (MIRALAX) 17 g packet Take 17 g by mouth daily as needed for mild constipation.    . sertraline (ZOLOFT) 50 MG tablet Take 50 mg by mouth daily.    . hydrocortisone cream 1 % Apply 1 application topically 2 (two) times daily as needed (swollen/red foot). (Patient not taking: Reported on 01/17/2020)     No current facility-administered medications for this visit.     REVIEW OF SYSTEMS:   A 10+ POINT REVIEW OF SYSTEMS WAS OBTAINED including neurology, dermatology, psychiatry, cardiac, respiratory, lymph, extremities, GI, GU, Musculoskeletal, constitutional, breasts, reproductive, HEENT.  All pertinent positives are noted in the HPI.  All others are negative.   PHYSICAL EXAMINATION: ECOG PERFORMANCE STATUS: 2 - Symptomatic, <50% confined to bed  Vitals:   01/17/20 1510  BP: (!) 142/61  Pulse: 89  Resp: 18  Temp: 97.8 F (36.6 C)  SpO2: 96%   Filed Weights   01/17/20 1510  Weight: 125 lb 8 oz (56.9 kg)   Body mass index is 19.08 kg/m.  Exam was given in a chair   GENERAL:alert, in no acute distress and comfortable SKIN: no acute rashes, no significant lesions EYES: conjunctiva are pink and non-injected, sclera anicteric OROPHARYNX: MMM, no exudates, no oropharyngeal erythema or ulceration NECK: supple, no JVD LYMPH:  no palpable lymphadenopathy in the cervical,  axillary or inguinal regions LUNGS: clear to auscultation b/l with normal respiratory effort HEART: regular rate & rhythm ABDOMEN:  normoactive bowel sounds , non tender, not distended. No palpable hepatosplenomegaly.  Extremity: ecchymosis on upper extremities; trace pedal edema b/l PSYCH: alert & oriented x  3 with fluent speech NEURO: no focal motor/sensory deficits  LABORATORY DATA:  I have reviewed the data as listed  CBC Latest Ref Rng & Units 01/10/2020 12/16/2019 12/10/2019  WBC 4.0 - 10.5 K/uL 2.3(L) 2.6(L) 2.9(L)  Hemoglobin 12.0 - 15.0 g/dL 10.1(L) 9.3(L) 8.2(L)  Hematocrit 36 - 46 % 31.8(L) 28.4(L) 25.1(L)  Platelets 150 - 400 K/uL 93(L) 111(L) 66(L)    CMP Latest Ref Rng & Units 12/16/2019 12/10/2019 12/09/2019  Glucose 70 - 99 mg/dL 101(H) 105(H) 104(H)  BUN 8 - 23 mg/dL _0 Creatinine 0.44 - 1.00 mg/dL 1.09(H) 0.94 0.91  Sodium 135 - 145 mmol/L 131(L) 136 134(L)  Potassium 3.5 - 5.1 mmol/L 5.0 3.7 3.8  Chloride 98 - 111 mmol/L 99 105 104  CO2 22 - 32 mmol/L _1 Calcium 8.9 - 10.3 mg/dL 9.5 8.4(L) 8.2(L)  Total Protein 6.5 - 8.1 g/dL 7.9 - -  Total Bilirubin 0.3 - 1.2 mg/dL 0.3 - -  Alkaline Phos 38 - 126 U/L 84 - -  AST 15 - 41 U/L 31 - -  ALT 0 - 44 U/L 39 - -   01/10/2020 BM Bx Report ([id number]):    12/16/2019 JAK2, MPL, CALR Panel Report:    RADIOGRAPHIC STUDIES: I have personally reviewed the radiological images as listed and agreed with the findings in the report. CT Biopsy  Result Date: 01/10/2020 INDICATION: 86 year old female with pancytopenia EXAM: CT GUIDED BONE MARROW ASPIRATION AND CORE BIOPSY Interventional Radiologist:  Criselda Peaches, MD MEDICATIONS: None. ANESTHESIA/SEDATION: Moderate (conscious) sedation was employed during this procedure. A total of 1 milligrams versed and 50 micrograms fentanyl were administered intravenously. The patient's level of consciousness and vital signs were monitored continuously by radiology nursing  throughout the procedure under my direct supervision. Total monitored sedation time: 10 minutes FLUOROSCOPY TIME:  None. COMPLICATIONS: None immediate. Estimated blood loss: <25 mL PROCEDURE: Informed written consent was obtained from the patient after a thorough discussion of the procedural risks, benefits and alternatives. All questions were addressed. Maximal Sterile Barrier Technique was utilized including caps, mask, sterile gowns, sterile gloves, sterile drape, hand hygiene and skin antiseptic. A timeout was performed prior to the initiation of the procedure. The patient was positioned prone and non-contrast localization CT was performed of the pelvis to demonstrate the iliac marrow spaces. Maximal barrier sterile technique utilized including caps, mask, sterile gowns, sterile gloves, large sterile drape, hand hygiene, and betadine prep. Under sterile conditions and local anesthesia, an 11 gauge coaxial bone biopsy needle was advanced into the right iliac marrow space. Needle position was confirmed with CT imaging. Initially, bone marrow aspiration was performed. Next, the 11 gauge outer cannula was utilized to obtain a right iliac bone marrow core biopsy. Needle was removed. Hemostasis was obtained with compression. The patient tolerated the procedure well. Samples were prepared with the cytotechnologist. IMPRESSION: Technically successful CT-guided bone marrow aspiration and biopsy of the right iliac bone. Electronically Signed   By: Jacqulynn Cadet M.D.   On: 01/10/2020 16:14   CT BONE MARROW BIOPSY & ASPIRATION  Result Date: 01/10/2020 INDICATION: 86 year old female with pancytopenia EXAM: CT GUIDED BONE MARROW ASPIRATION AND CORE BIOPSY Interventional Radiologist:  Criselda Peaches, MD MEDICATIONS: None. ANESTHESIA/SEDATION: Moderate (conscious) sedation was employed during this procedure. A total of 1 milligrams versed and 50 micrograms fentanyl were administered intravenously. The patient's  level of consciousness and vital signs were monitored continuously by radiology nursing throughout the procedure under my direct supervision.  Total monitored sedation time: 10 minutes FLUOROSCOPY TIME:  None. COMPLICATIONS: None immediate. Estimated blood loss: <25 mL PROCEDURE: Informed written consent was obtained from the patient after a thorough discussion of the procedural risks, benefits and alternatives. All questions were addressed. Maximal Sterile Barrier Technique was utilized including caps, mask, sterile gowns, sterile gloves, sterile drape, hand hygiene and skin antiseptic. A timeout was performed prior to the initiation of the procedure. The patient was positioned prone and non-contrast localization CT was performed of the pelvis to demonstrate the iliac marrow spaces. Maximal barrier sterile technique utilized including caps, mask, sterile gowns, sterile gloves, large sterile drape, hand hygiene, and betadine prep. Under sterile conditions and local anesthesia, an 11 gauge coaxial bone biopsy needle was advanced into the right iliac marrow space. Needle position was confirmed with CT imaging. Initially, bone marrow aspiration was performed. Next, the 11 gauge outer cannula was utilized to obtain a right iliac bone marrow core biopsy. Needle was removed. Hemostasis was obtained with compression. The patient tolerated the procedure well. Samples were prepared with the cytotechnologist. IMPRESSION: Technically successful CT-guided bone marrow aspiration and biopsy of the right iliac bone. Electronically Signed   By: Jacqulynn Cadet M.D.   On: 01/10/2020 16:14     ASSESSMENT & PLAN:  TAKERRA LUPINACCI is a 86 y.o. female with:  1. Pancytopenia 2. Bladder lesion -- patient following with urology PLAN: -Discussed pt labwork today, 01/17/20; WBC is low, Hgb is slightly improved, PLT is steady  -Discussed 01/10/2020 BM Bx Surgical Pathology Report ([id number]) which revealed "BONE MARROW,  ASPIRATE, CLOT, CORE: -Hypercellular marrow with myeloid hyperplasia and megakaryocytic atypia  PERIPHERAL BLOOD: - Pancytopenia". -Discussed 12/16/19 of JAK2 (including V617 and Exon 12), MPL, and CALR-Next Generation Sequencing shows "Tier II: Variants of Potential Clinical Significance - SRSF2 p.Pro95His" -Advised pt that she has MDS with a proliferative component - waiting on genetic studies to return to complete staging process. -Advised pt that her condition is not curable without an allogeneic transplant, but can be supported or treated.  -Advised pt that if she is low-risk we will watch with labs and clinic visits and may use supportive therapies. If she is high-risk may need to consider low-grade chemotherapy options.  -Some concern for increased risk of infection due to WBC at 2.3K - recommend pt stay up-to-date with age-appropriate vaccinations.  -Advised pt that MDS, as well as senile purpura are contributing factors in increased bruising.  -Recommend pt continue OTC Vitamin B-complex  -Recommend pt take 40 mg Protonix daily  -Recommend pt hold PO Iron to prevent stomach discomfort  -Advised pt that chronic Aspirin use can cause stomach ulcers  -Recommend pt f/u with Urology as scheduled -Will see back in 2 months with labs  -Recommend pt contact our clinic or go to ED at any sign of infection   FOLLOW UP RTC with Dr Irene Limbo with labs in 2 months   The total time spent in the appt was 40 minutes and more than 50% was on counseling and direct patient cares.  All of the patient's questions were answered with apparent satisfaction. The patient knows to call the clinic with any problems, questions or concerns.   Sullivan Lone MD [place] AAHIVMS [facility] Hematology/Oncology Physician [facility]  (Office):       [phone] (Work cell):  [phone] (Fax):           [phone]  01/17/2020 4:27 PM  I, Yevette Edwards, am acting as a Education administrator for  Dr. Sullivan Lone.   .I  have reviewed the above documentation for accuracy and completeness, and I agree with the above. [name] MD

## 2020-01-20 ENCOUNTER — Encounter (HOSPITAL_COMMUNITY): Payer: Self-pay | Admitting: Hematology

## 2020-01-20 ENCOUNTER — Telehealth: Payer: Self-pay

## 2020-01-20 NOTE — Telephone Encounter (Signed)
Received call from pt. complaining that since she saw Dr. Irene Limbo on Monday 06/14 she in having increased pain to right area rib cage where she bruised. Described pain as sharp and painful when she breathes or moves. Also complained of nausea, low grade fever and fatigue; she would like to know what to do. Encouraged pt. to go to the ER for further evaluation. She responded that she called hoping to get some antibiotics prescribed because she thinks she may have infected the bruise. Advised her that per Dr. Irene Limbo: evaluation/imaging and determination of etiology of pain is needed before treating accordingly. Pt. stated that she would double on her tylenol and if she does not feel any relief by tomorrow, will go to the ER. Reminded pt. not to exceed the recommended adult dose of 4,000 milligrams of acetaminophen, she verbalized understanding.

## 2020-01-21 LAB — SURGICAL PATHOLOGY

## 2020-01-27 ENCOUNTER — Encounter: Payer: Self-pay | Admitting: *Deleted

## 2020-01-27 ENCOUNTER — Other Ambulatory Visit: Payer: Self-pay | Admitting: *Deleted

## 2020-01-27 ENCOUNTER — Encounter (HOSPITAL_COMMUNITY): Payer: Self-pay | Admitting: Hematology

## 2020-01-27 NOTE — Patient Outreach (Signed)
[facility]) Care Management [facility] CM Telephone Outreach Post-hospital discharge day # 54 without unplanned hospital re-admission  01/27/2020  Alison Bridges 03/11/1929 [id number]  Successful telephone outreach to [facility], 86 y/o female referred to [facility] RN CM on Dec 13, 2019 by [facility] CMA for EMMI Red-Alert notification; patient was recently hospitalized May 5-7, 2021 for (R) lower extremity edema/ cellulitis and pancytopenia possibly induced by a spider bite.Patient has history including, but not limited to, hypothyroidism, depression, and osteoporosis. She was discharged home to self-care without home health services.  HIPAA/ identity verified and patient reports "things going pretty well;" reports that she attended bone marrow biopsy and was subsequently advised that she has "MDS;" (myelodysplastic syndrome); states that there is no specific treatment but that she will be followed regularly by oncology provider.  Reports "feeling a little stronger" over the last few days.  She sounds to be in no distress throughout phone call today.  Patient further reports -- upcoming provider office visits to PCP and urologist on Thursday July 1; dentist appointment Wednesday June 30 -- continues to transport self to appointments; strong social/ family network that can assist with transportation/ errands if indicated -- leg swelling from previous hospitalization now completely resolved -- no new falls; does not use assistive devices regularly- occasionally uses cane, if indicated -- continues self-managing medications; denies medication concerns  Patient denies further issues, concerns, or problems today. Iconfirmed that patient hasmy direct phone number, the main [facility] CM office phone number, and the [facility] CM 24-hour nurse advice phone number should issues arise prior to next scheduled [facility] CM outreach. Encouraged patient to contact me directly if needs, questions, issues, or concerns arise  prior to next scheduled outreach next month; patient agreed to do so.  Plan:  Patient will take medications as prescribed and will attend all scheduled provider appointments  Patient will promptly notify care providers for any new concerns/ issues/ problems that arise  [facility] outreach to continue with scheduled phone call next month, unless indicated sooner  [facility] CM Care Plan Problem One     Most Recent Value  Care Plan Problem One High risk for hospital re-admission related to/ as evidenced by recent hospitalization for lower extremity cellulitis with new diagnosis of pancytopenia in patient who lives alone  Role Documenting the Problem One Care Management [place] for Problem One Not Active  [facility] Long Term Goal  Over the next 31 days, patient will not experience hospital re-admission, as evidenced by patient reporting and review of EHR during [facility] RN CM outreach  [facility] Long Term Goal Start Date 12/28/19  [facility] Long Term Goal Met Date 01/27/20  [facility] met]  Interventions for Problem One Long Term Goal Discussed with patient her current clinical condition and confirmed that she has not experienced any unplanned hospital admissions since last hospital discharge,  confirmed that patient does not have current clinical concerns  [facility] CM Short Term Goal #1  Over the next 30 days, patient will attend all scheduled provider appointments, as evidenced by patient reporting, review of EHR, and collaboration with care providers as indicated during [facility] RN CM outreach  [facility] CM Short Term Goal #1 Start Date 12/28/19  [facility] CM Short Term Goal #1 Met Date 01/27/20  [Goal Met]  Interventions for Short Term Goal #1 Confirmed that patient has attended all recent provider appointments and continues to transport self to provider office visits,  reviewed recent appointments to oncology and urology providers with patient and  confirmed that she has a good understanding of general plan of care post-provider  office visits  [facility] CM Short Term Goal #2  Over the next 30 days, patient will complete bone marrow biopsy as evidenced by patient reporting and review of EHR during [facility] RN CM outreach  [facility] CM Short Term Goal #2 Start Date 12/28/19  [facility] CM Short Term Goal #2 Met Date 01/27/20  [Goal Met]  Interventions for Short Term Goal #2 Confirmed that patient attended bone marrow biopsy as scheduled,  review previously mailed printed educational material with patient and confirmed that she reviewed and has no ongoing questions    [facility] CM Care Plan Problem Two     Most Recent Value  Care Plan Problem Two Ongoing self-health management of chronic disease state of myelodysplastic syndrome, as evidenced by patient reporting  Role Documenting the Problem Two Care Management [place] for Problem Two Active  Interventions for Problem Two Long Term Goal  Discussed with patient upcoming scheduled provider appointments and confirmed that she has accurate understanding of all scheduled appointments with plans to attend all as scheduled  [facility] Long Term Goal Over the next 90 days, patient will attend all scheduled provider appointments as evidenced by review of same with patient and collaboration with care providers as indicated during [facility] RN CM outreach  [facility] Long Term Goal Start Date 01/27/20     Oneta Rack, RN, BSN, [place] Coordinator [facility] Care Management  [phone]

## 2020-02-03 ENCOUNTER — Telehealth: Payer: Self-pay | Admitting: Hematology

## 2020-02-03 NOTE — Telephone Encounter (Signed)
Scheduled per 06/14 los, patient has been called and notified.  

## 2020-02-04 DIAGNOSIS — Z1212 Encounter for screening for malignant neoplasm of rectum: Secondary | ICD-10-CM | POA: Diagnosis not present

## 2020-02-29 ENCOUNTER — Other Ambulatory Visit: Payer: Self-pay | Admitting: *Deleted

## 2020-02-29 ENCOUNTER — Encounter: Payer: Self-pay | Admitting: *Deleted

## 2020-02-29 NOTE — Patient Outreach (Signed)
[facility]) Care Management [facility] CM Telephone Outreach- case closure Post-hospital discharge day # 82 without unplanned hospital re-admission  02/29/2020  Alison Bridges 1929/03/30 [id number]  Successful telephone outreach to [facility], 86 y/o female referred to [facility] RN CM on Dec 13, 2019 by [facility] CMA for EMMI Red-Alert notification; patient was recently hospitalized May 5-7, 2072fr (R) lower extremity edema/ cellulitis and pancytopenia possibly induced by a spider bite.Patient has history including, but not limited to, hypothyroidism, depression, and osteoporosis. She was discharged home to self-care without home health services.  HIPAA/ identity verifiedand patient reports "things going pretty well;" reports that she attended recent PCP office visit and confirms no changes made to medications; reports no longer receiving treatment for recently diagnosed pancytopenia, but continues to have regular follow up from oncology provider for monitoring.  Patient denies clinical concerns, issues, problems and tells me today that she will be moving into Assisted Living facility soon ([place]); she continues to drive self and remains independent in ADL's and iADL's.  Patient denies ongoing care coordination/ disease management/ pharmacy/ and community resource needs; SDOH assessment updated and no concerns identified.  Reports family remains very involved and supportive in her life.  Patient declines ongoing follow up from TBoltstating that she believes she has fully recuperated from her hospitalization earlier this year.  [facility] CM case closure discussed with patient and she is agreeable; again stating no ongoing needs.  Very appreciative of follow up post- hospitalization.  Iconfirmed that patient hasmy direct phone number, the main [facility] CM office phone number, and the [facility] number should issues arise in the future- encouraged patient to contact me  directly or to contact [place]; she is agreeable to this.  Plan:  [facility] has successfully met her previously established goals and denies ongoing care coordination/ disease management/ pharmacy and community resource needs, and will make patient's PCP aware of same.  LOneta Rack RN, BSN, CInt[facility] CorporationTH[facility] Center[facility] Management  [phone]

## 2020-03-10 ENCOUNTER — Telehealth: Payer: Self-pay | Admitting: Hematology

## 2020-03-10 NOTE — Telephone Encounter (Signed)
Rescheduled appointment time per schedule change, called patient and left a voicemail.

## 2020-03-13 ENCOUNTER — Ambulatory Visit: Payer: Medicare Other | Admitting: Hematology

## 2020-03-13 ENCOUNTER — Other Ambulatory Visit: Payer: Medicare Other

## 2020-03-13 ENCOUNTER — Inpatient Hospital Stay (HOSPITAL_BASED_OUTPATIENT_CLINIC_OR_DEPARTMENT_OTHER): Payer: Medicare Other | Admitting: Hematology

## 2020-03-13 ENCOUNTER — Inpatient Hospital Stay: Payer: Medicare Other | Attending: Hematology

## 2020-03-13 ENCOUNTER — Other Ambulatory Visit: Payer: Self-pay

## 2020-03-13 VITALS — BP 121/53 | HR 93 | Temp 98.9°F | Resp 18 | Ht 68.0 in | Wt 123.2 lb

## 2020-03-13 DIAGNOSIS — Z800 Family history of malignant neoplasm of digestive organs: Secondary | ICD-10-CM

## 2020-03-13 DIAGNOSIS — D649 Anemia, unspecified: Secondary | ICD-10-CM | POA: Diagnosis not present

## 2020-03-13 DIAGNOSIS — D469 Myelodysplastic syndrome, unspecified: Secondary | ICD-10-CM

## 2020-03-13 DIAGNOSIS — D539 Nutritional anemia, unspecified: Secondary | ICD-10-CM | POA: Insufficient documentation

## 2020-03-13 DIAGNOSIS — Z90710 Acquired absence of both cervix and uterus: Secondary | ICD-10-CM | POA: Insufficient documentation

## 2020-03-13 DIAGNOSIS — D61818 Other pancytopenia: Secondary | ICD-10-CM

## 2020-03-13 DIAGNOSIS — N329 Bladder disorder, unspecified: Secondary | ICD-10-CM | POA: Diagnosis not present

## 2020-03-13 LAB — CMP (CANCER CENTER ONLY)
ALT: 37 U/L (ref 0–44)
AST: 32 U/L (ref 15–41)
Albumin: 3.7 g/dL (ref 3.5–5.0)
Alkaline Phosphatase: 98 U/L (ref 38–126)
Anion gap: 8 (ref 5–15)
BUN: 28 mg/dL — ABNORMAL HIGH (ref 8–23)
CO2: 22 mmol/L (ref 22–32)
Calcium: 9.4 mg/dL (ref 8.9–10.3)
Chloride: 103 mmol/L (ref 98–111)
Creatinine: 1.16 mg/dL — ABNORMAL HIGH (ref 0.44–1.00)
GFR, Est AFR Am: 48 mL/min — ABNORMAL LOW (ref >60)
GFR, Estimated: 42 mL/min — ABNORMAL LOW (ref >60)
Glucose, Bld: 116 mg/dL — ABNORMAL HIGH (ref 70–99)
Potassium: 4.3 mmol/L (ref 3.5–5.1)
Sodium: 133 mmol/L — ABNORMAL LOW (ref 135–145)
Total Bilirubin: 0.4 mg/dL (ref 0.3–1.2)
Total Protein: 7.4 g/dL (ref 6.5–8.1)

## 2020-03-13 LAB — CBC WITH DIFFERENTIAL/PLATELET
Abs Immature Granulocytes: 0.03 10*3/uL (ref 0.00–0.07)
Basophils Absolute: 0 10*3/uL (ref 0.0–0.1)
Basophils Relative: 0 %
Eosinophils Absolute: 0 10*3/uL (ref 0.0–0.5)
Eosinophils Relative: 0 %
HCT: 26.1 % — ABNORMAL LOW (ref 36.0–46.0)
Hemoglobin: 8.5 g/dL — ABNORMAL LOW (ref 12.0–15.0)
Immature Granulocytes: 1 %
Lymphocytes Relative: 33 %
Lymphs Abs: 1.2 10*3/uL (ref 0.7–4.0)
MCH: 34.3 pg — ABNORMAL HIGH (ref 26.0–34.0)
MCHC: 32.6 g/dL (ref 30.0–36.0)
MCV: 105.2 fL — ABNORMAL HIGH (ref 80.0–100.0)
Monocytes Absolute: 1.4 10*3/uL — ABNORMAL HIGH (ref 0.1–1.0)
Monocytes Relative: 39 %
Neutro Abs: 1 10*3/uL — ABNORMAL LOW (ref 1.7–7.7)
Neutrophils Relative %: 27 %
Platelets: 77 10*3/uL — ABNORMAL LOW (ref 150–400)
RBC: 2.48 MIL/uL — ABNORMAL LOW (ref 3.87–5.11)
RDW: 15.1 % (ref 11.5–15.5)
WBC: 3.7 10*3/uL — ABNORMAL LOW (ref 4.0–10.5)
nRBC: 0 % (ref 0.0–0.2)

## 2020-03-13 LAB — IRON AND TIBC
Iron: 41 ug/dL (ref 41–142)
Saturation Ratios: 17 % — ABNORMAL LOW (ref 21–57)
TIBC: 240 ug/dL (ref 236–444)
UIBC: 199 ug/dL (ref 120–384)

## 2020-03-13 LAB — FERRITIN: Ferritin: 687 ng/mL — ABNORMAL HIGH (ref 11–307)

## 2020-03-13 MED ORDER — DEXAMETHASONE 2 MG PO TABS
2.0000 mg | ORAL_TABLET | Freq: Every day | ORAL | 0 refills | Status: DC
Start: 2020-03-13 — End: 2020-03-22

## 2020-03-13 NOTE — Progress Notes (Signed)
HEMATOLOGY/ONCOLOGY CLINIC NOTE  Date of Service: 03/13/2020  Patient Care Team: Burnard Bunting, MD as PCP - General (Internal Medicine)  REFERRING PHYSICIAN: Burnard Bunting, MD  CHIEF COMPLAINTS/PURPOSE OF CONSULTATION:  Pancytopenia  HISTORY OF PRESENTING ILLNESS:  Alison Bridges is a wonderful 86 y.o. female who has been referred to [place] by Burnard Bunting, MD for evaluation and management of Pancytopenia. The pt reports that she is doing well overall.   The pt reports she is good. Pt lives alone. Prior to the pandemic she was taking a workout class. About the last 3 months pt has had fatigue, loss of appetite, and weight loss. Last October pt had fatigue, SOB, and was spitting up blood. She has acid reflux and had esophagus dilated twice. A week ago pt was in the hospital due to infection in her right ankle. Her ankle started swelling and turning red. The ankle was not painful but did have burning and tightness. She was put on antibiotics and iron.  Pt uses partial dentures and does not use denture cream. She has been having a burning and hot feeling in feet and face that comes and goes. Her thyroid medication has been stable and she takes the pills several hours before eating with just water. Pt has loss about 10 pounds due to not having appetite. Recently she has had spontaneous bruising. She has had both doses of COVID19 vaccine. Pt had been taking 50mg  of zync for 4-6weeks prior to hospitalization.   Of note prior to the patient's visit today, pt has had DG Ankle Complete Right ([id number]) completed on 12/08/19 with results revealing "Soft tissue swelling without acute osseous finding." Pt has had Lower Venous DVT Study completed on 12/09/19 with results revealing "RIGHT: There is no evidence of deep vein thrombosis in the lower extremity. No cystic structure found in the popliteal fossa. LEFT: No evidence of common femoral vein obstruction." Pt has had CT Abdomen Pelvis W IV  Contrast completed on 10/07/19 with results revealing, " 1. No acute finding 2. Areas of enhancement in the right bladder worrisome for neoplasm 3. Cholecystectomy 4. Bilateral renal cortical cysts and nonobstructive left renal calculi. 5. Aortoiliac atherosclerosis without aneurysm. 6. Hysterectomy 7. Diverticulosis without diverticulitis. 8. Lumbar DDD and DJD with degenerative anterolisthesis of L5 on S1"  Most recent lab results (12/10/19) of CBC is as follows: all values are WNL except for WBC at 2.9K, RBC at 2.35, Hemoglobin at 8.2, HCT at 25.1, MCV at 106.8, MCH at 34.9, Platelets at 66K, Neutro Abs at 0.6K, Monocytes Absolute at 1.4K, Glucose at 105, Calcium at 8.4, GFR, Calc Non Af Amer at 54 12/08/19 of Lactic Acid, Venous at 1.3 12/08/19 of CRP at 0.8: WNL 12/08/19 of Reticulocytes is as follows: all values are WNL except for RC at 2.52, Immature Retic Fract at 16.9 12/08/19 of Ferritin at 167: WNL 12/08/19 of Iron and TIBC is as follows: all values are WNL except for Iron at 27, Saturation Ratios at 10 12/08/19 of Folate at 52.3: WNL 12/08/19 of Vitamin B12 at 931 12/08/19 of Sed Rate at 60  On review of systems, pt reports fatigue, lack of appetite, burning/hot sensation in face and feet, weight loss  and denies lumps/bumps, skin rashes, bone pain, fever, chills, night sweats, changes in bowl habits and urinary habits, nose bleeds, gum bleeds, blood in urine/stool, lightheaded, dizzy, changes in breathing, back pain, constipation and any other symptoms.   INTERVAL HISTORY:   Alison Bridges is a  wonderful 86 y.o. female who is here for evaluation and management of Pancytopenia. The patient's last visit with [place] was on 01/17/2020. The pt reports that she is doing well overall.  The pt reports increasing fatigue. No infection or bleeding issues. Discussed consideration of PRBC transfusion - patient consented and agreeable. Discussed starting Aranesp injection for symptomatic anemia  from MDS.   MEDICAL HISTORY:  Past Medical History:  Diagnosis Date  . Depression   . Hypothyroidism   . Osteoporosis    SURGICAL HISTORY: Past Surgical History:  Procedure Laterality Date  . ABDOMINAL HYSTERECTOMY    . APPENDECTOMY  1998  . CHOLECYSTECTOMY  1998   SOCIAL HISTORY: Social History   Socioeconomic History  . Marital status: Widowed    Spouse name: Not on file  . Number of children: Not on file  . Years of education: Not on file  . Highest education level: Not on file  Occupational History  . Occupation: Oncologist    Comment: Retired  Tobacco Use  . Smoking status: Never Smoker  . Smokeless tobacco: Never Used  Substance and Sexual Activity  . Alcohol use: No  . Drug use: No  . Sexual activity: Not on file  Other Topics Concern  . Not on file  Social History Narrative   She is tried to stay active --  2 days a week she does senior aerobics.  She also enjoys going for walks, but no standard routine.      She has 3 children and 2 grandchildren.   Social Determinants of Health   Financial Resource Strain:   . Difficulty of Paying Living Expenses:   Food Insecurity: No Food Insecurity  . Worried About Charity fundraiser in the Last Year: Never true  . Ran Out of Food in the Last Year: Never true  Transportation Needs: No Transportation Needs  . Lack of Transportation (Medical): No  . Lack of Transportation (Non-Medical): No  Physical Activity:   . Days of Exercise per Week:   . Minutes of Exercise per Session:   Stress:   . Feeling of Stress :   Social Connections:   . Frequency of Communication with Friends and Family:   . Frequency of Social Gatherings with Friends and Family:   . Attends Religious Services:   . Active Member of Clubs or Organizations:   . Attends Archivist Meetings:   Marland Kitchen Marital Status:   Intimate Partner Violence:   . Fear of Current or Ex-Partner:   . Emotionally Abused:   Marland Kitchen Physically Abused:     . Sexually Abused:      FAMILY HISTORY: Family History  Problem Relation Age of Onset  . Heart attack Mother 81  . Pancreatic cancer Father   . Parkinson's disease Sister   . Diabetes Brother        Multiple complications  . Heart disease Brother        Not sure what happened  . Dementia Brother      ALLERGIES:   has No Known Allergies.   MEDICATIONS:  Current Outpatient Medications  Medication Sig Dispense Refill  . cholecalciferol (VITAMIN D) 1000 UNITS tablet Take 1,000 Units by mouth daily.    . ferrous sulfate 325 (65 FE) MG tablet Take 1 tablet (325 mg total) by mouth daily. 30 tablet 1  . fluticasone (FLONASE) 50 MCG/ACT nasal spray Place 1-2 sprays into both nostrils daily as needed for allergies or rhinitis.    . hydrocortisone  cream 1 % Apply 1 application topically 2 (two) times daily as needed (swollen/red foot). (Patient not taking: Reported on 01/17/2020)    . ibuprofen (ADVIL) 200 MG tablet Take 200 mg by mouth every 6 (six) hours as needed for headache or moderate pain.    Marland Kitchen levothyroxine (SYNTHROID, LEVOTHROID) 125 MCG tablet Take 125 mcg by mouth daily before breakfast.    . loratadine (CLARITIN) 10 MG tablet Take 10 mg by mouth daily.    Marland Kitchen MELATONIN PO Take 1 tablet by mouth at bedtime.    . Multiple Vitamin (MULTIVITAMIN WITH MINERALS) TABS tablet Take 1 tablet by mouth daily.    . Multiple Vitamins-Minerals (ZINC PO) Take 1 tablet by mouth daily.    . pantoprazole (PROTONIX) 40 MG tablet Take 40 mg by mouth daily.    . polyethylene glycol (MIRALAX) 17 g packet Take 17 g by mouth daily as needed for mild constipation.    . sertraline (ZOLOFT) 50 MG tablet Take 50 mg by mouth daily.     No current facility-administered medications for this visit.     REVIEW OF SYSTEMS:   A 10+ POINT REVIEW OF SYSTEMS WAS OBTAINED including neurology, dermatology, psychiatry, cardiac, respiratory, lymph, extremities, GI, GU, Musculoskeletal, constitutional, breasts,  reproductive, HEENT.  All pertinent positives are noted in the HPI.  All others are negative.   PHYSICAL EXAMINATION: ECOG PERFORMANCE STATUS: 2 - Symptomatic, <50% confined to bed  Vitals:   03/13/20 1531  BP: (!) 121/53  Pulse: 93  Resp: 18  Temp: 98.9 F (37.2 C)  SpO2: 97%   Filed Weights   03/13/20 1531  Weight: 123 lb 3.2 oz (55.9 kg)   Body mass index is 18.73 kg/m.  NAD . GENERAL:alert, in no acute distress and comfortable SKIN: no acute rashes, no significant lesions EYES: conjunctiva are pink and non-injected, sclera anicteric OROPHARYNX: MMM, no exudates, no oropharyngeal erythema or ulceration NECK: supple, no JVD LYMPH:  no palpable lymphadenopathy in the cervical, axillary or inguinal regions LUNGS: clear to auscultation b/l with normal respiratory effort HEART: regular rate & rhythm ABDOMEN:  normoactive bowel sounds , non tender, not distended. Extremity: no pedal edema PSYCH: alert & oriented x 3 with fluent speech NEURO: no focal motor/sensory deficits   LABORATORY DATA:  I have reviewed the data as listed  CBC Latest Ref Rng & Units 03/13/2020 01/10/2020 12/16/2019  WBC 4.0 - 10.5 K/uL 3.7(L) 2.3(L) 2.6(L)  Hemoglobin 12.0 - 15.0 g/dL 8.5(L) 10.1(L) 9.3(L)  Hematocrit 36 - 46 % 26.1(L) 31.8(L) 28.4(L)  Platelets 150 - 400 K/uL 77(L) 93(L) 111(L)    CMP Latest Ref Rng & Units 03/13/2020 12/16/2019 12/10/2019  Glucose 70 - 99 mg/dL 116(H) 101(H) 105(H)  BUN 8 - 23 mg/dL 28(H) 22 14  Creatinine 0.44 - 1.00 mg/dL 1.16(H) 1.09(H) 0.94  Sodium 135 - 145 mmol/L 133(L) 131(L) 136  Potassium 3.5 - 5.1 mmol/L 4.3 5.0 3.7  Chloride 98 - 111 mmol/L 103 99 105  CO2 22 - 32 mmol/L 22 24 22   Calcium 8.9 - 10.3 mg/dL 9.4 9.5 8.4(L)  Total Protein 6.5 - 8.1 g/dL 7.4 7.9 -  Total Bilirubin 0.3 - 1.2 mg/dL 0.4 0.3 -  Alkaline Phos 38 - 126 U/L 98 84 -  AST 15 - 41 U/L 32 31 -  ALT 0 - 44 U/L 37 39 -    01/10/2020 Cytogenetics [phone]):    01/10/2020 FISH  Panel ([id number]):   01/10/2020 BM Bx Report ([id number]):  12/16/2019 JAK2, MPL, CALR Panel Report:    RADIOGRAPHIC STUDIES: I have personally reviewed the radiological images as listed and agreed with the findings in the report. No results found.   ASSESSMENT & PLAN:  Alison Bridges is a 86 y.o. female with:  1. Pancytopenia - likely from MDS +/- MPN -01/10/2020 BM Bx Surgical Pathology Report ([id number]) revealed "BONE MARROW, ASPIRATE, CLOT, CORE: -Hypercellular marrow with myeloid hyperplasia and megakaryocytic atypia  PERIPHERAL BLOOD: - Pancytopenia". -12/16/19 JAK2 (including V617 and Exon 12), MPL, and CALR-Next Generation Sequencing shows "Tier II: Variants of Potential Clinical Significance - SRSF2 p.Pro95His" 2. Bladder lesion -- patient following with urology PLAN:  -labs reviewed with patient. Worsening anemia. WBC count iimproved. PLT down to 70k range -Recommend pt continue OTC Vitamin B-complex  -Plz schedule 1 unit of PRBC in 2-3 days IV INjectafer weekly x 2 doses RTC with Dr Irene Limbo with labs and appointment to start [facility] In 1 months    The total time spent in the appt was 20 minutes and more than 50% was on counseling and direct patient cares.  All of the patient's questions were answered with apparent satisfaction. The patient knows to call the clinic with any problems, questions or concerns.   Sullivan Lone MD [place] AAHIVMS [facility] Hematology/Oncology Physician [facility]  (Office):       [phone] (Work cell):  [phone] (Fax):           [phone]  03/13/2020 9:29 AM  I, Yevette Edwards, am acting as a scribe for Dr. Sullivan Lone.   .I have reviewed the above documentation for accuracy and completeness, and I agree with the above. [name] MD

## 2020-03-13 NOTE — Patient Instructions (Signed)
Thank you for choosing [facility] to provide your oncology and hematology care.   Should you have questions after your visit to the [facility] ([facility]), please contact this office at [phone] between 8:30 AM and 4:30 PM.  Voice mails left after 4:00 PM may not be returned until the following business day.  Calls received after 4:30 PM will be answered by an off-site Nurse Triage Line.    Prescription Refills:  Please have your pharmacy contact us directly for most prescription requests.  Contact the office directly for refills of narcotics (pain medications). Allow 48-72 hours for refills.  Appointments: Please contact the [facility] scheduling department [phone] for questions regarding [facility] appointment scheduling.  Contact the schedulers with any scheduling changes so that your appointment can be rescheduled in a timely manner.   Central Scheduling for [place] [phone] - Call to schedule procedures such as PET scans, CT scans, MRI, Ultrasound, etc.  To afford each patient quality time with our providers, please arrive 30 minutes before your scheduled appointment time.  If you arrive late for your appointment, you may be asked to reschedule.  We strive to give you quality time with our providers, and arriving late affects you and other patients whose appointments are after yours. If you are a no show for multiple scheduled visits, you may be dismissed from the clinic at the providers discretion.     Resources: [facility] Social Workers [phone] for additional information on assistance programs or assistance connecting with community support programs   [place] DSS  336-641-3447: Information regarding food stamps, Medicaid, and utility assistance [place] Access [place] [phone]   [place] [employer]'s shared-ride transportation service for eligible riders who have a disability that prevents them from riding the fixed route bus.   Medicare  Rights Center 800-333-4114 Helps people with Medicare understand their rights and benefits, navigate the Medicare system, and secure the quality healthcare they deserve [employer] 800-227-2345 Assists patients locate various types of support and financial assistance Cancer Care: 1-800-813-HOPE (4673) Provides financial assistance, online support groups, medication/co-pay assistance.   Transportation Assistance for appointments at [facility]: Transportation Coordinator [phone]  Again, thank you for choosing [facility] for your care.       

## 2020-03-14 ENCOUNTER — Telehealth: Payer: Self-pay | Admitting: Hematology

## 2020-03-14 LAB — ERYTHROPOIETIN: Erythropoietin: 22.6 m[IU]/mL — ABNORMAL HIGH (ref 2.6–18.5)

## 2020-03-14 NOTE — Telephone Encounter (Signed)
Scheduled per 08/09 los, patient has been called and notified. 

## 2020-03-16 ENCOUNTER — Other Ambulatory Visit: Payer: Self-pay | Admitting: *Deleted

## 2020-03-16 ENCOUNTER — Telehealth: Payer: Self-pay | Admitting: Hematology

## 2020-03-16 DIAGNOSIS — D649 Anemia, unspecified: Secondary | ICD-10-CM

## 2020-03-16 DIAGNOSIS — D469 Myelodysplastic syndrome, unspecified: Secondary | ICD-10-CM

## 2020-03-16 NOTE — Telephone Encounter (Signed)
Scheduled appointment per 8/12 scheduling message. Attempted to call patient but was unable to reach her and voicemail was unavailable. Spoke with patient's son who is aware of updated appointment and will pass information along to patient.

## 2020-03-17 ENCOUNTER — Inpatient Hospital Stay: Payer: Medicare Other

## 2020-03-17 ENCOUNTER — Other Ambulatory Visit: Payer: Self-pay

## 2020-03-17 ENCOUNTER — Other Ambulatory Visit: Payer: Self-pay | Admitting: Hematology

## 2020-03-17 DIAGNOSIS — D649 Anemia, unspecified: Secondary | ICD-10-CM

## 2020-03-17 DIAGNOSIS — D61818 Other pancytopenia: Secondary | ICD-10-CM | POA: Diagnosis not present

## 2020-03-17 DIAGNOSIS — D469 Myelodysplastic syndrome, unspecified: Secondary | ICD-10-CM

## 2020-03-17 DIAGNOSIS — Z800 Family history of malignant neoplasm of digestive organs: Secondary | ICD-10-CM | POA: Diagnosis not present

## 2020-03-17 DIAGNOSIS — N329 Bladder disorder, unspecified: Secondary | ICD-10-CM | POA: Diagnosis not present

## 2020-03-17 DIAGNOSIS — Z90710 Acquired absence of both cervix and uterus: Secondary | ICD-10-CM | POA: Diagnosis not present

## 2020-03-17 LAB — PREPARE RBC (CROSSMATCH)

## 2020-03-17 MED ORDER — ACETAMINOPHEN 325 MG PO TABS
650.0000 mg | ORAL_TABLET | Freq: Once | ORAL | Status: AC
  Administered 2020-03-17: 650 mg via ORAL

## 2020-03-17 MED ORDER — METHYLPREDNISOLONE SODIUM SUCC 40 MG IJ SOLR
INTRAMUSCULAR | Status: AC
  Filled 2020-03-17: qty 1

## 2020-03-17 MED ORDER — SODIUM CHLORIDE 0.9% IV SOLUTION
250.0000 mL | Freq: Once | INTRAVENOUS | Status: AC
  Administered 2020-03-17: 250 mL via INTRAVENOUS
  Filled 2020-03-17: qty 250

## 2020-03-17 MED ORDER — ACETAMINOPHEN 325 MG PO TABS
ORAL_TABLET | ORAL | Status: AC
  Filled 2020-03-17: qty 2

## 2020-03-17 MED ORDER — METHYLPREDNISOLONE SODIUM SUCC 40 MG IJ SOLR
30.0000 mg | Freq: Once | INTRAMUSCULAR | Status: AC
  Administered 2020-03-17: 30 mg via INTRAVENOUS

## 2020-03-17 NOTE — Patient Instructions (Signed)

## 2020-03-18 LAB — TYPE AND SCREEN
ABO/RH(D): O POS
Antibody Screen: NEGATIVE
Unit division: 0

## 2020-03-18 LAB — BPAM RBC
Blood Product Expiration Date: 202109102359
ISSUE DATE / TIME: 202108131405
Unit Type and Rh: 5100

## 2020-03-20 ENCOUNTER — Emergency Department (HOSPITAL_COMMUNITY)
Admission: EM | Admit: 2020-03-20 | Discharge: 2020-03-21 | Disposition: A | Discharge status: Home / Self Care | Payer: Medicare Other | Source: Home / Self Care

## 2020-03-20 ENCOUNTER — Telehealth: Payer: Self-pay | Admitting: *Deleted

## 2020-03-20 ENCOUNTER — Other Ambulatory Visit: Payer: Self-pay

## 2020-03-20 ENCOUNTER — Encounter (HOSPITAL_COMMUNITY): Payer: Self-pay | Admitting: Emergency Medicine

## 2020-03-20 DIAGNOSIS — A419 Sepsis, unspecified organism: Secondary | ICD-10-CM | POA: Diagnosis not present

## 2020-03-20 DIAGNOSIS — Z5321 Procedure and treatment not carried out due to patient leaving prior to being seen by health care provider: Secondary | ICD-10-CM | POA: Insufficient documentation

## 2020-03-20 DIAGNOSIS — E871 Hypo-osmolality and hyponatremia: Secondary | ICD-10-CM | POA: Diagnosis not present

## 2020-03-20 DIAGNOSIS — Z20822 Contact with and (suspected) exposure to covid-19: Secondary | ICD-10-CM | POA: Diagnosis not present

## 2020-03-20 DIAGNOSIS — R6520 Severe sepsis without septic shock: Secondary | ICD-10-CM | POA: Diagnosis not present

## 2020-03-20 DIAGNOSIS — R509 Fever, unspecified: Secondary | ICD-10-CM | POA: Insufficient documentation

## 2020-03-20 DIAGNOSIS — R5383 Other fatigue: Secondary | ICD-10-CM | POA: Insufficient documentation

## 2020-03-20 DIAGNOSIS — N390 Urinary tract infection, site not specified: Secondary | ICD-10-CM | POA: Diagnosis not present

## 2020-03-20 DIAGNOSIS — R945 Abnormal results of liver function studies: Secondary | ICD-10-CM | POA: Diagnosis not present

## 2020-03-20 DIAGNOSIS — K449 Diaphragmatic hernia without obstruction or gangrene: Secondary | ICD-10-CM | POA: Diagnosis not present

## 2020-03-20 DIAGNOSIS — N281 Cyst of kidney, acquired: Secondary | ICD-10-CM | POA: Diagnosis not present

## 2020-03-20 DIAGNOSIS — R339 Retention of urine, unspecified: Secondary | ICD-10-CM | POA: Diagnosis not present

## 2020-03-20 DIAGNOSIS — I700 Atherosclerosis of aorta: Secondary | ICD-10-CM | POA: Diagnosis not present

## 2020-03-20 DIAGNOSIS — N200 Calculus of kidney: Secondary | ICD-10-CM | POA: Diagnosis not present

## 2020-03-20 DIAGNOSIS — N179 Acute kidney failure, unspecified: Secondary | ICD-10-CM | POA: Diagnosis not present

## 2020-03-20 DIAGNOSIS — R531 Weakness: Secondary | ICD-10-CM | POA: Diagnosis not present

## 2020-03-20 LAB — COMPREHENSIVE METABOLIC PANEL
ALT: 41 U/L (ref 0–44)
AST: 32 U/L (ref 15–41)
Albumin: 3.6 g/dL (ref 3.5–5.0)
Alkaline Phosphatase: 87 U/L (ref 38–126)
Anion gap: 11 (ref 5–15)
BUN: 38 mg/dL — ABNORMAL HIGH (ref 8–23)
CO2: 20 mmol/L — ABNORMAL LOW (ref 22–32)
Calcium: 8.9 mg/dL (ref 8.9–10.3)
Chloride: 102 mmol/L (ref 98–111)
Creatinine, Ser: 1.55 mg/dL — ABNORMAL HIGH (ref 0.44–1.00)
GFR calc Af Amer: 34 mL/min — ABNORMAL LOW (ref >60)
GFR calc non Af Amer: 29 mL/min — ABNORMAL LOW (ref >60)
Glucose, Bld: 113 mg/dL — ABNORMAL HIGH (ref 70–99)
Potassium: 4.2 mmol/L (ref 3.5–5.1)
Sodium: 133 mmol/L — ABNORMAL LOW (ref 135–145)
Total Bilirubin: 0.6 mg/dL (ref 0.3–1.2)
Total Protein: 7.1 g/dL (ref 6.5–8.1)

## 2020-03-20 LAB — CBC WITH DIFFERENTIAL/PLATELET
Abs Immature Granulocytes: 0.07 10*3/uL (ref 0.00–0.07)
Basophils Absolute: 0 10*3/uL (ref 0.0–0.1)
Basophils Relative: 0 %
Eosinophils Absolute: 0 10*3/uL (ref 0.0–0.5)
Eosinophils Relative: 0 %
HCT: 31 % — ABNORMAL LOW (ref 36.0–46.0)
Hemoglobin: 9.8 g/dL — ABNORMAL LOW (ref 12.0–15.0)
Immature Granulocytes: 1 %
Lymphocytes Relative: 11 %
Lymphs Abs: 0.8 10*3/uL (ref 0.7–4.0)
MCH: 33.2 pg (ref 26.0–34.0)
MCHC: 31.6 g/dL (ref 30.0–36.0)
MCV: 105.1 fL — ABNORMAL HIGH (ref 80.0–100.0)
Monocytes Absolute: 2.6 10*3/uL — ABNORMAL HIGH (ref 0.1–1.0)
Monocytes Relative: 36 %
Neutro Abs: 3.8 10*3/uL (ref 1.7–7.7)
Neutrophils Relative %: 52 %
Platelets: 57 10*3/uL — ABNORMAL LOW (ref 150–400)
RBC: 2.95 MIL/uL — ABNORMAL LOW (ref 3.87–5.11)
RDW: 16.4 % — ABNORMAL HIGH (ref 11.5–15.5)
WBC: 7.3 10*3/uL (ref 4.0–10.5)
nRBC: 0 % (ref 0.0–0.2)

## 2020-03-20 NOTE — Telephone Encounter (Signed)
Patient called - Had blood transfusion on Friday. Experienced fever (up to 102) and chills over weekend that continues. She also reports poor appetite. Dr. Irene Limbo informed.  Contacted patient with Dr.Kale's instructions: Go to ED for evaluation. Patient verbalized understanding.

## 2020-03-20 NOTE — ED Triage Notes (Signed)
Pt reports that she had a blood transfusion on Friday. Reports since Friday afternoon had chills, fevers and fatigue. Reports her doctor wanted her to come in to be seen. Reports took Tylenol around 8.0-9am today.

## 2020-03-20 NOTE — ED Provider Notes (Cosign Needed)
MSE was initiated and I personally evaluated the patient and placed orders (if any) at  3:16 PM on March 20, 2020.  Patient is an 86 year old female with past medical history significant for hypothyroidism, depression, osteoporosis, myelodysplastic syndrome and resulting anemia.  Patient presented today for intermittent fevers that began Friday 8/13 after she had her first blood infusion for pancytopenia secondary to MDS.  She denies any chest pain shortness of breath but states that she had some sweating and temperature of 100 on Friday after getting her infusion.  She states that she was alone in the infusion center although everybody was wearing masks.  She has been double vaccinated for Covid months ago.  She states she has no known Covid exposure which she is aware of.  She denies any cough or congestion but does endorse some aches.  She states that the next day, Saturday she had chills decreased appetite.  Her symptoms continued Sunday and over the weekend she had a T-max of 102.  She is stating that her decreased appetite seems new/worse than before.  She states that she lives alone.  She denies any nausea, vomiting, headache, vision changes, focal weakness, lightheadedness dizziness chest pain or shortness of breath.  She denies any difficulty breathing and denies any swelling of her tongue or mouth.  She has never had allergic reaction that she does have before and denies any rashes currently.   CONSTITUTIONAL:  well-appearing, NAD NEURO:  Alert and oriented x 3, no focal deficits EYES:  pupils equal and reactive ENT/NECK:  trachea midline, no JVD CARDIO:  reg rate, reg rhythm, well-perfused PULM:  None labored breathing GI/GU:  Abdomen non-distended MSK/SPINE:  No gross deformities, no edema SKIN:  no rash obvious, atraumatic, no ecchymosis, no notable petechia PSYCH:  Appropriate speech and behavior  My review of EMR it appears the patient was seen 03/13/2020 in oncology office and  endorsed loss of appetite, weight loss, fatigue.    We will obtain basic labs to evaluate patient's pancytopenia and Covid test as I have moderate to high suspicion that she may have been exposed during her transfusion or perhaps a few days prior and began having symptoms for the evening.  The patient appears stable so that the remainder of the MSE may be completed by another provider.   Alison Bridges [place], [place] 03/20/20 1523

## 2020-03-21 ENCOUNTER — Inpatient Hospital Stay: Payer: Medicare Other

## 2020-03-21 ENCOUNTER — Telehealth: Payer: Self-pay | Admitting: *Deleted

## 2020-03-21 NOTE — ED Notes (Signed)
Eloped from waiting area prior to room call. Called 3X.

## 2020-03-21 NOTE — Telephone Encounter (Signed)
Patient called. Asked if opk to r/s appt today for iron infusion.States she still feels bad - not sure if she has a temperature as she hasn't taken it today. Was awaked with night sweats last night. Noticed her ability to taste food is diminished. States someone at the ED last night told her she might have ben exposed to Covid. Patient left ED last night before Covid test could be performed - states "I had been there a long time and was getting tired". States she is still short of breath at times. Dr. Irene Limbo informed. Per Dr. Irene Limbo - contacted patient with following: Appointments for IV iron can be moved out 2-3 weeks. Current appts cancelled and schedule message sent. Per Dr. Irene Limbo - advised patient to consider being tested for Covid at another location such as PCP, Urgent Care or pharmacy. If test positive, asked patient to contact this office. If patient's shortness of breath gets worse, or if she develops chest pain, advised to go to ED immediately - call 911 if needed. Patient verbalized understanding of all information.

## 2020-03-22 ENCOUNTER — Emergency Department (HOSPITAL_COMMUNITY): Payer: Medicare Other

## 2020-03-22 ENCOUNTER — Encounter (HOSPITAL_COMMUNITY): Payer: Self-pay

## 2020-03-22 ENCOUNTER — Inpatient Hospital Stay (HOSPITAL_COMMUNITY)
Admission: EM | Admit: 2020-03-22 | Discharge: 2020-03-25 | DRG: 872 | Disposition: A | Discharge status: Home / Self Care | Payer: Medicare Other | Attending: Internal Medicine | Admitting: Internal Medicine

## 2020-03-22 ENCOUNTER — Other Ambulatory Visit: Payer: Self-pay

## 2020-03-22 DIAGNOSIS — R0902 Hypoxemia: Secondary | ICD-10-CM | POA: Diagnosis not present

## 2020-03-22 DIAGNOSIS — Z20822 Contact with and (suspected) exposure to covid-19: Secondary | ICD-10-CM | POA: Diagnosis present

## 2020-03-22 DIAGNOSIS — R0602 Shortness of breath: Secondary | ICD-10-CM | POA: Diagnosis not present

## 2020-03-22 DIAGNOSIS — D61818 Other pancytopenia: Secondary | ICD-10-CM | POA: Diagnosis present

## 2020-03-22 DIAGNOSIS — N3001 Acute cystitis with hematuria: Secondary | ICD-10-CM | POA: Diagnosis not present

## 2020-03-22 DIAGNOSIS — R000 Tachycardia, unspecified: Secondary | ICD-10-CM | POA: Diagnosis not present

## 2020-03-22 DIAGNOSIS — K449 Diaphragmatic hernia without obstruction or gangrene: Secondary | ICD-10-CM | POA: Diagnosis not present

## 2020-03-22 DIAGNOSIS — M810 Age-related osteoporosis without current pathological fracture: Secondary | ICD-10-CM | POA: Diagnosis present

## 2020-03-22 DIAGNOSIS — A419 Sepsis, unspecified organism: Principal | ICD-10-CM | POA: Diagnosis present

## 2020-03-22 DIAGNOSIS — Z79890 Hormone replacement therapy (postmenopausal): Secondary | ICD-10-CM

## 2020-03-22 DIAGNOSIS — N200 Calculus of kidney: Secondary | ICD-10-CM | POA: Diagnosis not present

## 2020-03-22 DIAGNOSIS — E871 Hypo-osmolality and hyponatremia: Secondary | ICD-10-CM | POA: Diagnosis present

## 2020-03-22 DIAGNOSIS — R1030 Lower abdominal pain, unspecified: Secondary | ICD-10-CM | POA: Diagnosis not present

## 2020-03-22 DIAGNOSIS — D469 Myelodysplastic syndrome, unspecified: Secondary | ICD-10-CM | POA: Diagnosis present

## 2020-03-22 DIAGNOSIS — F329 Major depressive disorder, single episode, unspecified: Secondary | ICD-10-CM | POA: Diagnosis present

## 2020-03-22 DIAGNOSIS — R338 Other retention of urine: Secondary | ICD-10-CM | POA: Diagnosis not present

## 2020-03-22 DIAGNOSIS — E039 Hypothyroidism, unspecified: Secondary | ICD-10-CM | POA: Diagnosis present

## 2020-03-22 DIAGNOSIS — N179 Acute kidney failure, unspecified: Secondary | ICD-10-CM | POA: Diagnosis not present

## 2020-03-22 DIAGNOSIS — I700 Atherosclerosis of aorta: Secondary | ICD-10-CM | POA: Diagnosis not present

## 2020-03-22 DIAGNOSIS — R339 Retention of urine, unspecified: Secondary | ICD-10-CM

## 2020-03-22 DIAGNOSIS — Z79899 Other long term (current) drug therapy: Secondary | ICD-10-CM

## 2020-03-22 DIAGNOSIS — R6520 Severe sepsis without septic shock: Secondary | ICD-10-CM | POA: Diagnosis present

## 2020-03-22 DIAGNOSIS — N390 Urinary tract infection, site not specified: Secondary | ICD-10-CM | POA: Diagnosis present

## 2020-03-22 DIAGNOSIS — N281 Cyst of kidney, acquired: Secondary | ICD-10-CM | POA: Diagnosis not present

## 2020-03-22 DIAGNOSIS — R7989 Other specified abnormal findings of blood chemistry: Secondary | ICD-10-CM

## 2020-03-22 DIAGNOSIS — R531 Weakness: Secondary | ICD-10-CM | POA: Diagnosis not present

## 2020-03-22 DIAGNOSIS — Z7952 Long term (current) use of systemic steroids: Secondary | ICD-10-CM

## 2020-03-22 DIAGNOSIS — R945 Abnormal results of liver function studies: Secondary | ICD-10-CM | POA: Diagnosis not present

## 2020-03-22 LAB — CBC WITH DIFFERENTIAL/PLATELET
Abs Immature Granulocytes: 0.18 10*3/uL — ABNORMAL HIGH (ref 0.00–0.07)
Basophils Absolute: 0 10*3/uL (ref 0.0–0.1)
Basophils Relative: 0 %
Eosinophils Absolute: 0 10*3/uL (ref 0.0–0.5)
Eosinophils Relative: 0 %
HCT: 27.7 % — ABNORMAL LOW (ref 36.0–46.0)
Hemoglobin: 9.3 g/dL — ABNORMAL LOW (ref 12.0–15.0)
Immature Granulocytes: 3 %
Lymphocytes Relative: 4 %
Lymphs Abs: 0.3 10*3/uL — ABNORMAL LOW (ref 0.7–4.0)
MCH: 33.6 pg (ref 26.0–34.0)
MCHC: 33.6 g/dL (ref 30.0–36.0)
MCV: 100 fL (ref 80.0–100.0)
Monocytes Absolute: 1.1 10*3/uL — ABNORMAL HIGH (ref 0.1–1.0)
Monocytes Relative: 16 %
Neutro Abs: 5.6 10*3/uL (ref 1.7–7.7)
Neutrophils Relative %: 77 %
Platelets: 38 10*3/uL — ABNORMAL LOW (ref 150–400)
RBC: 2.77 MIL/uL — ABNORMAL LOW (ref 3.87–5.11)
RDW: 15.4 % (ref 11.5–15.5)
WBC: 7.1 10*3/uL (ref 4.0–10.5)
nRBC: 0 % (ref 0.0–0.2)

## 2020-03-22 LAB — COMPREHENSIVE METABOLIC PANEL
ALT: 89 U/L — ABNORMAL HIGH (ref 0–44)
AST: 67 U/L — ABNORMAL HIGH (ref 15–41)
Albumin: 3.2 g/dL — ABNORMAL LOW (ref 3.5–5.0)
Alkaline Phosphatase: 193 U/L — ABNORMAL HIGH (ref 38–126)
Anion gap: 12 (ref 5–15)
BUN: 58 mg/dL — ABNORMAL HIGH (ref 8–23)
CO2: 16 mmol/L — ABNORMAL LOW (ref 22–32)
Calcium: 8.5 mg/dL — ABNORMAL LOW (ref 8.9–10.3)
Chloride: 99 mmol/L (ref 98–111)
Creatinine, Ser: 1.88 mg/dL — ABNORMAL HIGH (ref 0.44–1.00)
GFR calc Af Amer: 27 mL/min — ABNORMAL LOW (ref >60)
GFR calc non Af Amer: 23 mL/min — ABNORMAL LOW (ref >60)
Glucose, Bld: 148 mg/dL — ABNORMAL HIGH (ref 70–99)
Potassium: 4.1 mmol/L (ref 3.5–5.1)
Sodium: 127 mmol/L — ABNORMAL LOW (ref 135–145)
Total Bilirubin: 1.1 mg/dL (ref 0.3–1.2)
Total Protein: 6.9 g/dL (ref 6.5–8.1)

## 2020-03-22 LAB — URINALYSIS, ROUTINE W REFLEX MICROSCOPIC
Bilirubin Urine: NEGATIVE
Glucose, UA: NEGATIVE mg/dL
Ketones, ur: NEGATIVE mg/dL
Nitrite: NEGATIVE
Protein, ur: 30 mg/dL — AB
Specific Gravity, Urine: 1.008 (ref 1.005–1.030)
pH: 6 (ref 5.0–8.0)

## 2020-03-22 LAB — LACTIC ACID, PLASMA: Lactic Acid, Venous: 1.2 mmol/L (ref 0.5–1.9)

## 2020-03-22 MED ORDER — ONDANSETRON HCL 4 MG/2ML IJ SOLN
4.0000 mg | Freq: Four times a day (QID) | INTRAMUSCULAR | Status: DC | PRN
  Administered 2020-03-24: 4 mg via INTRAVENOUS
  Filled 2020-03-22: qty 2

## 2020-03-22 MED ORDER — SODIUM CHLORIDE 0.9 % IV SOLN: INTRAVENOUS | Status: DC

## 2020-03-22 MED ORDER — LEVOTHYROXINE SODIUM 125 MCG PO TABS
125.0000 ug | ORAL_TABLET | Freq: Every day | ORAL | Status: DC
  Administered 2020-03-23 – 2020-03-25 (×3): 125 ug via ORAL
  Filled 2020-03-22 (×3): qty 1

## 2020-03-22 MED ORDER — SODIUM CHLORIDE 0.9 % IV BOLUS
1000.0000 mL | Freq: Once | INTRAVENOUS | Status: AC
  Administered 2020-03-22: 1000 mL via INTRAVENOUS

## 2020-03-22 MED ORDER — VITAMIN D 25 MCG (1000 UNIT) PO TABS
1000.0000 [IU] | ORAL_TABLET | Freq: Every day | ORAL | Status: DC
  Administered 2020-03-23 – 2020-03-25 (×3): 1000 [IU] via ORAL
  Filled 2020-03-22 (×3): qty 1

## 2020-03-22 MED ORDER — ADULT MULTIVITAMIN W/MINERALS CH
1.0000 | ORAL_TABLET | Freq: Every day | ORAL | Status: DC
  Administered 2020-03-23 – 2020-03-25 (×3): 1 via ORAL
  Filled 2020-03-22 (×3): qty 1

## 2020-03-22 MED ORDER — PANTOPRAZOLE SODIUM 40 MG PO TBEC: 40.0000 mg | DELAYED_RELEASE_TABLET | Freq: Every day | ORAL | Status: DC | PRN

## 2020-03-22 MED ORDER — SERTRALINE HCL 100 MG PO TABS
100.0000 mg | ORAL_TABLET | Freq: Every day | ORAL | Status: DC
  Administered 2020-03-23 – 2020-03-25 (×3): 100 mg via ORAL
  Filled 2020-03-22 (×3): qty 1

## 2020-03-22 MED ORDER — POLYETHYLENE GLYCOL 3350 17 G PO PACK
17.0000 g | PACK | Freq: Every day | ORAL | Status: DC | PRN
  Filled 2020-03-22: qty 1

## 2020-03-22 MED ORDER — ONDANSETRON HCL 4 MG PO TABS: 4.0000 mg | ORAL_TABLET | Freq: Four times a day (QID) | ORAL | Status: DC | PRN

## 2020-03-22 MED ORDER — LORAZEPAM 2 MG/ML IJ SOLN
0.5000 mg | Freq: Once | INTRAMUSCULAR | Status: AC
  Administered 2020-03-22: 0.5 mg via INTRAVENOUS
  Filled 2020-03-22: qty 1

## 2020-03-22 MED ORDER — SODIUM CHLORIDE 0.9 % IV SOLN
1.0000 g | Freq: Once | INTRAVENOUS | Status: AC
  Administered 2020-03-22: 1 g via INTRAVENOUS
  Filled 2020-03-22: qty 10

## 2020-03-22 MED ORDER — ACETAMINOPHEN 325 MG PO TABS
650.0000 mg | ORAL_TABLET | Freq: Four times a day (QID) | ORAL | Status: DC | PRN
  Administered 2020-03-23 – 2020-03-24 (×5): 650 mg via ORAL
  Filled 2020-03-22 (×5): qty 2

## 2020-03-22 MED ORDER — ACETAMINOPHEN 650 MG RE SUPP: 650.0000 mg | Freq: Four times a day (QID) | RECTAL | Status: DC | PRN

## 2020-03-22 MED ORDER — SODIUM CHLORIDE 0.9 % IV SOLN
1.0000 g | INTRAVENOUS | Status: DC
  Administered 2020-03-23 – 2020-03-24 (×2): 1 g via INTRAVENOUS
  Filled 2020-03-22: qty 10
  Filled 2020-03-22 (×2): qty 1

## 2020-03-22 NOTE — H&P (Signed)
History and Physical    Alison Bridges WGY:[id number] DOB: 08-13-1928 DOA: 03/22/2020  PCP: Burnard Bunting, MD  Patient coming from: Home  I have personally briefly reviewed patient's old medical records in [place]  Chief Complaint: UTI  HPI: Alison Bridges is a 86 y.o. female with medical history significant of MDS.  Pt had blood transfusion 8/13.  Fevers and chills on 8/16 up to 102 -> went to ED -> LWBS.  Went to PCP yesterday, COVID test neg (and patient fully vaccinated though she was worried about getting exposed while waiting on 8/16).  UA suspicious for UTI.  Pt in to ED today.   ED Course: Creat 1.88 up from 1.55 on 8/16 and 1.1 baseline.  BUN 58.  WBC nl, has thrombocytopenia with platelets of 38 (somewhat worse than recent baseline with MDS).  HGB 9.3.  CT abd/pelvis neg for acute pathology but pt having urinary retention with > 800cc in bladder, got straight cathed in ED.  UA with mod LE, 11-20 WBC.    Review of Systems: As per HPI, otherwise all review of systems negative.  Past Medical History:  Diagnosis Date  . Depression   . Hypothyroidism   . Osteoporosis     Past Surgical History:  Procedure Laterality Date  . ABDOMINAL HYSTERECTOMY    . APPENDECTOMY  1998  . CHOLECYSTECTOMY  1998     reports that she has never smoked. She has never used smokeless tobacco. She reports that she does not drink alcohol and does not use drugs.  No Known Allergies  Family History  Problem Relation Age of Onset  . Heart attack Mother 76  . Pancreatic cancer Father   . Parkinson's disease Sister   . Diabetes Brother        Multiple complications  . Heart disease Brother        Not sure what happened  . Dementia Brother      Prior to Admission medications   Medication Sig Start Date End Date Taking? Authorizing Provider  cholecalciferol (VITAMIN D) 1000 UNITS tablet Take 1,000 Units by mouth daily.    [provider]  dexamethasone  (DECADRON) 2 MG tablet Take 1 tablet (2 mg total) by mouth daily with breakfast. 03/13/20   [name], MD  ferrous sulfate 325 (65 FE) MG tablet Take 1 tablet (325 mg total) by mouth daily. 12/10/19 02/08/20  Shelly Coss, MD  fluticasone (FLONASE) 50 MCG/ACT nasal spray Place 1-2 sprays into both nostrils daily as needed for allergies or rhinitis.    [provider]  hydrocortisone cream 1 % Apply 1 application topically 2 (two) times daily as needed (swollen/red foot). Patient not taking: Reported on 01/17/2020    [provider]  ibuprofen (ADVIL) 200 MG tablet Take 200 mg by mouth every 6 (six) hours as needed for headache or moderate pain.    [provider]  levothyroxine (SYNTHROID, LEVOTHROID) 125 MCG tablet Take 125 mcg by mouth daily before breakfast.    [provider]  loratadine (CLARITIN) 10 MG tablet Take 10 mg by mouth daily.    [provider]  MELATONIN PO Take 1 tablet by mouth at bedtime.    [provider]  Multiple Vitamin (MULTIVITAMIN WITH MINERALS) TABS tablet Take 1 tablet by mouth daily.    [provider]  Multiple Vitamins-Minerals (ZINC PO) Take 1 tablet by mouth daily.    [provider]  pantoprazole (PROTONIX) 40 MG tablet Take  40 mg by mouth daily.    [provider]  polyethylene glycol (MIRALAX) 17 g packet Take 17 g by mouth daily as needed for mild constipation.    [provider]  sertraline (ZOLOFT) 50 MG tablet Take 50 mg by mouth daily.    [provider]    Physical Exam: Vitals:   03/22/20 1909 03/22/20 2030 03/22/20 2122 03/22/20 2206  BP: 129/61 132/77 (!) 119/52 (!) 108/50  Pulse: (!) 113 (!) 109 97 93  Resp: (!) 22 (!) 28 (!) 21 18  Temp: 98.7 F (37.1 C)     TempSrc: Oral     SpO2: 94% 92% 94% 94%    Constitutional: NAD, calm, comfortable Eyes: PERRL, lids and conjunctivae normal ENMT: Mucous membranes are moist. Posterior pharynx  clear of any exudate or lesions.Normal dentition.  Neck: normal, supple, no masses, no thyromegaly Respiratory: clear to auscultation bilaterally, no wheezing, no crackles. Normal respiratory effort. No accessory muscle use.  Cardiovascular: Regular rate and rhythm, no murmurs / rubs / gallops. No extremity edema. 2+ pedal pulses. No carotid bruits.  Abdomen: no tenderness, no masses palpated. No hepatosplenomegaly. Bowel sounds positive.  Musculoskeletal: no clubbing / cyanosis. No joint deformity upper and lower extremities. Good ROM, no contractures. Normal muscle tone.  Skin: no rashes, lesions, ulcers. No induration Neurologic: CN 2-12 grossly intact. Sensation intact, DTR normal. Strength 5/5 in all 4.  Psychiatric: Normal judgment and insight. Alert and oriented x 3. Normal mood.    Labs on Admission: I have personally reviewed following labs and imaging studies  CBC: Recent Labs  Lab 03/20/20 1558 03/22/20 1937  WBC 7.3 7.1  NEUTROABS 3.8 5.6  HGB 9.8* 9.3*  HCT 31.0* 27.7*  MCV 105.1* 100.0  PLT 57* 38*   Basic Metabolic Panel: Recent Labs  Lab 03/20/20 1558 03/22/20 1937  NA 133* 127*  K 4.2 4.1  CL 102 99  CO2 20* 16*  GLUCOSE 113* 148*  BUN 38* 58*  CREATININE 1.55* 1.88*  CALCIUM 8.9 8.5*   GFR: Estimated Creatinine Clearance: 17.9 mL/min (A) (by C-G formula based on SCr of 1.88 mg/dL (H)). Liver Function Tests: Recent Labs  Lab 03/20/20 1558 03/22/20 1937  AST 32 67*  ALT 41 89*  ALKPHOS 87 193*  BILITOT 0.6 1.1  PROT 7.1 6.9  ALBUMIN 3.6 3.2*   No results for input(s): LIPASE, AMYLASE in the last 168 hours. No results for input(s): AMMONIA in the last 168 hours. Coagulation Profile: No results for input(s): INR, PROTIME in the last 168 hours. Cardiac Enzymes: No results for input(s): CKTOTAL, CKMB, CKMBINDEX, TROPONINI in the last 168 hours. BNP (last 3 results) No results for input(s): PROBNP in the last 8760 hours. HbA1C: No results for  input(s): HGBA1C in the last 72 hours. CBG: No results for input(s): GLUCAP in the last 168 hours. Lipid Profile: No results for input(s): CHOL, HDL, LDLCALC, TRIG, CHOLHDL, LDLDIRECT in the last 72 hours. Thyroid Function Tests: No results for input(s): TSH, T4TOTAL, FREET4, T3FREE, THYROIDAB in the last 72 hours. Anemia Panel: No results for input(s): VITAMINB12, FOLATE, FERRITIN, TIBC, IRON, RETICCTPCT in the last 72 hours. Urine analysis:    Component Value Date/Time   COLORURINE YELLOW 03/22/2020 2200   APPEARANCEUR CLEAR 03/22/2020 2200   LABSPEC 1.008 03/22/2020 2200   PHURINE 6.0 03/22/2020 2200   GLUCOSEU NEGATIVE 03/22/2020 2200   HGBUR MODERATE (A) 03/22/2020 2200   BILIRUBINUR NEGATIVE 03/22/2020 [place] 03/22/2020 2200  PROTEINUR 30 (A) 03/22/2020 2200   NITRITE NEGATIVE 03/22/2020 2200   LEUKOCYTESUR MODERATE (A) 03/22/2020 2200    Radiological Exams on Admission: DG Chest Port 1 View  Result Date: 03/22/2020 CLINICAL DATA:  Weakness, shortness of breath, urinary tract infection EXAM: PORTABLE CHEST 1 VIEW COMPARISON:  06/03/2019 FINDINGS: Single frontal view of the chest demonstrates a stable cardiac silhouette. Atherosclerosis aortic arch unchanged. No airspace disease, effusion, or pneumothorax. No acute bony abnormalities. IMPRESSION: 1. Stable exam, no acute process. Electronically Signed   By: Randa Ngo M.D.   On: 03/22/2020 19:29   CT Renal Stone Study  Result Date: 03/22/2020 CLINICAL DATA:  86 year old female with flank pain. Concern for kidney stone. EXAM: CT ABDOMEN AND PELVIS WITHOUT CONTRAST TECHNIQUE: Multidetector CT imaging of the abdomen and pelvis was performed following the standard protocol without IV contrast. COMPARISON:  None. FINDINGS: Evaluation of this exam is limited in the absence of intravenous contrast. Lower chest: There is diffuse bibasilar interstitial prominence, likely edema. There is mild cardiomegaly. Coronary  vascular calcification noted. There is hypoattenuation of the cardiac blood pool suggestive of anemia. Clinical correlation is recommended. There is no intra-abdominal free air or free fluid. Hepatobiliary: The liver is mildly enlarged. There is slight irregularity of the liver contour may represent changes of cirrhosis. No intrahepatic biliary dilatation. Cholecystectomy. No retained calcified stone in the central CBD. Pancreas: Unremarkable. No pancreatic ductal dilatation or surrounding inflammatory changes. Spleen: Mild splenomegaly measuring approximately 15 cm in length. Adrenals/Urinary Tract: The adrenal glands are unremarkable. Several small nonobstructing left renal calculi measure up to 6 mm in the inferior pole. Several adjacent small stones noted in the upper pole of the left kidney. No hydronephrosis. There is a punctate nonobstructing right renal upper pole calculus. Left renal cysts measuring approximately 5 cm in the upper pole. Focal area of cortical defect and scarring involving the inferior pole of the right kidney with associated small cystic or low attenuating lesion as seen previously. Evaluation of the renal lesions are very limited and nondiagnostic due to respiratory motion artifact as well as in the absence of intravenous contrast. The visualized ureters and urinary bladder appear unremarkable. There is a small pocket of air within the urinary bladder, likely related related to recent instrumentation. Clinical correlation is recommended. Stomach/Bowel: There is a small hiatal hernia. Debris is noted in the distal esophagus which may represent reflux or delayed clearance. There is moderate stool throughout the colon. Several small scattered colonic diverticula without active inflammatory changes. There is no bowel obstruction or active inflammation. The cecum is located in the left anterior abdomen. The appendix is not visualized with certainty. No inflammatory changes identified in the  right lower quadrant. Vascular/Lymphatic: Advanced aortoiliac atherosclerotic disease. The IVC is unremarkable. No portal venous gas. There is no adenopathy. Reproductive: Hysterectomy. Other: Mild diffuse subcutaneous edema. Musculoskeletal: Osteopenia with degenerative changes of the spine. No acute osseous pathology. IMPRESSION: 1. Small nonobstructing bilateral renal calculi. No hydronephrosis. 2. Bilateral renal cysts. 3. Moderate colonic stool burden. No bowel obstruction. 4. Mild hepatosplenomegaly. 5. Aortic Atherosclerosis (ICD10-I70.0). Electronically Signed   By: Anner Crete M.D.   On: 03/22/2020 21:26    EKG: Independently reviewed.  Assessment/Plan Principal Problem:   Acute lower UTI Active Problems:   Pancytopenia ([place])   MDS (myelodysplastic syndrome) ([facility])   AKI (acute kidney injury) ([place])   Acute urinary retention    1. UTI - 1. Rocephin 2. Culture pending 3. Placing foley for retention 4. COVID was  neg at PCPs office, repeating here 2. Concern for sepsis - 1. Tachycardia on arrival, doesn't meet criteria for sepsis (yet) though as no documented fever here yet (unless we count the 102 at home this weekend). 2. Regardless got 2L fluid bolus 3. Repeat CBC in AM to trend WBC 4. Watch for fever or other SIRS to meet sepsis criteria 3. Acute urinary retention - 1. In setting of UTI 2. Foley ordered 3. I also see she has a "bladder lesion" of some sort that is being followed by urology according to Dr. Grier Mitts office visit note on 8/9 4. AKI - 1. Likely secondary to #2 above 2. Also probably not helped by infection 3. Got 2L bolus in ED 4. Putting on NS at 75 5. Strict intake and output 6. Repeat CMP in AM 5. MDS - 1. Chronic and stable 2. Cont home meds 3. Got transfusion on 8/16 with onc 4. Looks like they also doing IV iron infusions for next couple of weeks 5. Looks like they plan on starting aranesp in 1 month.  DVT prophylaxis: SCDs Code Status:  Full Family Communication: No family in room Disposition Plan: Home after AKI resolved and UTI treated Consults called: none Admission status: Place in 74     Reagan Behlke, [facility]  How to contact the [facility] Attending or Consulting provider [place] or covering provider during after hours [place], for this patient?  1. Check the care team in [facility] and look for a) attending/consulting [facility] provider listed and b) the [facility] team listed 2. Log into [email or web address]  Amion Physician Scheduling and messaging for groups and whole hospitals  On call and physician scheduling software for group practices, residents, hospitalists and other medical providers for call, clinic, rotation and shift schedules. OnCall [employer] is a hospital-wide system for scheduling doctors and paging doctors on call. EasyPlot is for scientific plotting and data analysis.  [email or web address]  and use [facility]'s universal password to access. If you do not have the password, please contact the hospital operator.  3. Locate the [facility] provider you are looking for under [facility] and page to a number that you can be directly reached. 4. If you still have difficulty reaching the provider, please page the [facility] (Director on Call) for the Hospitalists listed on amion for assistance.  03/22/2020, 11:15 PM

## 2020-03-22 NOTE — ED Notes (Signed)
Pt. Documented in error see above note in chart. 

## 2020-03-22 NOTE — ED Triage Notes (Signed)
Arrived via EMS, Pt dx with UTI this morning. Denies any urinary sx. States she was just feeling "not well" and was seen this morning at [facility] for wellness/COVID test. Daughter told pt to be seen due to risk of sepsis.

## 2020-03-22 NOTE — ED Notes (Signed)
Pt placed on purewick. Pt aware that we need a urine sample and says she will use the call bell when she can provide one

## 2020-03-22 NOTE — ED Provider Notes (Signed)
[facility] DEPT Provider Note   CSN: [id number] Arrival date & time: 03/22/20  1845     History Chief Complaint  Patient presents with  . Urinary Tract Infection    Alison Bridges is a 86 y.o. female hx of depression, hypothyrodism, MDS s/p blood transfusion here with weakness and possible sepsis.  Patient states that she had a blood transfusion on August 13.  She then subsequently felt weak and dizzy and had some chills.  Patient went to the ER and had labs drawn but left without being seen.  Patient went to her iron transfusion yesterday.  Patient was concerned that she may have gotten Covid while waiting in the ER and then went to her primary care doctor today and tested negative for Covid.  She still feels weak and tired.  She of note has a urinary tract infection per the UA in the office.  She then subsequently had some chills and low-grade temperature of 100.  She wants to come in to make sure she is not septic.  States that she has some suprapubic pain and some urinary frequency.  She states that she is very anxious right now.  She denies any cough and she is fully vaccinated against COVID.   The history is provided by the patient.       Past Medical History:  Diagnosis Date  . Depression   . Hypothyroidism   . Osteoporosis     Patient Active Problem List   Diagnosis Date Noted  . MDS (myelodysplastic syndrome) ([place]) 03/13/2020  . Anemia 03/13/2020  . Cellulitis of right ankle 12/08/2019  . Pancytopenia ([place]) 12/08/2019  . Palpitations 11/25/2017    Past Surgical History:  Procedure Laterality Date  . ABDOMINAL HYSTERECTOMY    . APPENDECTOMY  1998  . CHOLECYSTECTOMY  1998     OB History   No obstetric history on file.     Family History  Problem Relation Age of Onset  . Heart attack Mother 82  . Pancreatic cancer Father   . Parkinson's disease Sister   . Diabetes Brother        Multiple complications  . Heart disease Brother         Not sure what happened  . Dementia Brother     Social History   Tobacco Use  . Smoking status: Never Smoker  . Smokeless tobacco: Never Used  Substance Use Topics  . Alcohol use: No  . Drug use: No    Home Medications Prior to Admission medications   Medication Sig Start Date End Date Taking? Authorizing Provider  cholecalciferol (VITAMIN D) 1000 UNITS tablet Take 1,000 Units by mouth daily.    [provider]  dexamethasone (DECADRON) 2 MG tablet Take 1 tablet (2 mg total) by mouth daily with breakfast. 03/13/20   [name], MD  ferrous sulfate 325 (65 FE) MG tablet Take 1 tablet (325 mg total) by mouth daily. 12/10/19 02/08/20  Shelly Coss, MD  fluticasone (FLONASE) 50 MCG/ACT nasal spray Place 1-2 sprays into both nostrils daily as needed for allergies or rhinitis.    [provider]  hydrocortisone cream 1 % Apply 1 application topically 2 (two) times daily as needed (swollen/red foot). Patient not taking: Reported on 01/17/2020    [provider]  ibuprofen (ADVIL) 200 MG tablet Take 200 mg by mouth every 6 (six) hours as needed for headache or moderate pain.    [provider]  levothyroxine (SYNTHROID, [place])  125 MCG tablet Take 125 mcg by mouth daily before breakfast.    [provider]  loratadine (CLARITIN) 10 MG tablet Take 10 mg by mouth daily.    [provider]  MELATONIN PO Take 1 tablet by mouth at bedtime.    [provider]  Multiple Vitamin (MULTIVITAMIN WITH MINERALS) TABS tablet Take 1 tablet by mouth daily.    [provider]  Multiple Vitamins-Minerals (ZINC PO) Take 1 tablet by mouth daily.    [provider]  pantoprazole (PROTONIX) 40 MG tablet Take 40 mg by mouth daily.    [provider]  polyethylene glycol (MIRALAX) 17 g packet Take 17 g by mouth daily as needed for mild constipation.    [provider]  sertraline (ZOLOFT) 50 MG tablet  Take 50 mg by mouth daily.    [provider]    Allergies    Patient has no known allergies.  Review of Systems   Review of Systems  Constitutional: Positive for chills.  Genitourinary: Positive for frequency.  All other systems reviewed and are negative.   Physical Exam Updated Vital Signs BP 129/61 (BP Location: Left Arm)   Pulse (!) 113   Temp 98.7 F (37.1 C) (Oral)   Resp (!) 22   SpO2 94%   Physical Exam Vitals and nursing note reviewed.  Constitutional:      Comments: Anxious, shaking   HENT:     Head: Normocephalic.     Nose: Nose normal.     Mouth/Throat:     Mouth: Mucous membranes are dry.  Eyes:     Extraocular Movements: Extraocular movements intact.     Pupils: Pupils are equal, round, and reactive to light.  Cardiovascular:     Rate and Rhythm: Normal rate and regular rhythm.     Pulses: Normal pulses.     Heart sounds: Normal heart sounds.  Pulmonary:     Effort: Pulmonary effort is normal.     Breath sounds: Normal breath sounds.  Abdominal:     General: Abdomen is flat.     Comments: Mild suprapubic tenderness, no CVAT   Musculoskeletal:        General: Normal range of motion.     Cervical back: Normal range of motion.  Skin:    General: Skin is warm.     Capillary Refill: Capillary refill takes less than 2 seconds.  Neurological:     General: No focal deficit present.     Mental Status: She is alert and oriented to person, place, and time.  Psychiatric:        Mood and Affect: Mood normal.        Behavior: Behavior normal.     ED Results / Procedures / Treatments   Labs (all labs ordered are listed, but only abnormal results are displayed) Labs Reviewed  URINE CULTURE  CULTURE, BLOOD (ROUTINE X 2)  CULTURE, BLOOD (ROUTINE X 2)  CBC WITH DIFFERENTIAL/PLATELET  COMPREHENSIVE METABOLIC PANEL  URINALYSIS, ROUTINE W REFLEX MICROSCOPIC  LACTIC ACID, PLASMA  LACTIC ACID, PLASMA    EKG None  Radiology DG Chest Port 1  View  Result Date: 03/22/2020 CLINICAL DATA:  Weakness, shortness of breath, urinary tract infection EXAM: PORTABLE CHEST 1 VIEW COMPARISON:  06/03/2019 FINDINGS: Single frontal view of the chest demonstrates a stable cardiac silhouette. Atherosclerosis aortic arch unchanged. No airspace disease, effusion, or pneumothorax. No acute bony abnormalities. IMPRESSION: 1. Stable exam, no acute process. Electronically Signed   By:  Randa Ngo M.D.   On: 03/22/2020 19:29    Procedures Procedures (including critical care time)  Medications Ordered in ED Medications  sodium chloride 0.9 % bolus 1,000 mL (1,000 mLs Intravenous New Bag/Given 03/22/20 1939)  LORazepam (ATIVAN) injection 0.5 mg (0.5 mg Intravenous Given 03/22/20 1940)    ED Course  I have reviewed the triage vital signs and the nursing notes.  Pertinent labs & imaging results that were available during my care of the patient were reviewed by me and considered in my medical decision making (see chart for details).    MDM Rules/Calculators/A&P                          Alison Bridges is a 86 y.o. female here with chills and suprapubic pain.  Patient recently had a blood transfusion but this is been almost 5 days since then and she still has not been feeling well.  She already tested negative for Covid today.  Symptoms may be from UTI.  Given the chills and tachycardia, will make sure she is not septic.  Will get CBC, CMP, lactate, cultures, repeat UA and urine culture. Will give IVF and rocephin.    11:08 PM Patient's creatinine is getting worse and now is 1.8.  Patient has multiple electrolyte derangements including bicarb of 16 and hyponatremia to 127.  Patient's LFTs are also elevated.  CT showed renal cysts and no obvious hydro.  Patient had trouble urinating and her initial bladder scan was only 123 but when they performed bladder catheterization, about 800 cc came out.  Staff to put Foley into patient.  Patient appears very weak and  still feels unwell.  Wonder if she is septic from her UTI.  Will admit for IV antibiotics.  Blood and urine culture sent.   Final Clinical Impression(s) / ED Diagnoses Final diagnoses:  None    Rx / DC Orders ED Discharge Orders    None       Drenda Freeze, MD 03/22/20 2317

## 2020-03-23 DIAGNOSIS — D61818 Other pancytopenia: Secondary | ICD-10-CM | POA: Diagnosis present

## 2020-03-23 DIAGNOSIS — M810 Age-related osteoporosis without current pathological fracture: Secondary | ICD-10-CM | POA: Diagnosis present

## 2020-03-23 DIAGNOSIS — Z7952 Long term (current) use of systemic steroids: Secondary | ICD-10-CM | POA: Diagnosis not present

## 2020-03-23 DIAGNOSIS — E039 Hypothyroidism, unspecified: Secondary | ICD-10-CM | POA: Diagnosis present

## 2020-03-23 DIAGNOSIS — Z79890 Hormone replacement therapy (postmenopausal): Secondary | ICD-10-CM | POA: Diagnosis not present

## 2020-03-23 DIAGNOSIS — N179 Acute kidney failure, unspecified: Secondary | ICD-10-CM | POA: Diagnosis present

## 2020-03-23 DIAGNOSIS — R339 Retention of urine, unspecified: Secondary | ICD-10-CM | POA: Diagnosis not present

## 2020-03-23 DIAGNOSIS — Z20822 Contact with and (suspected) exposure to covid-19: Secondary | ICD-10-CM | POA: Diagnosis present

## 2020-03-23 DIAGNOSIS — D469 Myelodysplastic syndrome, unspecified: Secondary | ICD-10-CM | POA: Diagnosis present

## 2020-03-23 DIAGNOSIS — E871 Hypo-osmolality and hyponatremia: Secondary | ICD-10-CM | POA: Diagnosis present

## 2020-03-23 DIAGNOSIS — Z79899 Other long term (current) drug therapy: Secondary | ICD-10-CM | POA: Diagnosis not present

## 2020-03-23 DIAGNOSIS — A419 Sepsis, unspecified organism: Secondary | ICD-10-CM | POA: Diagnosis present

## 2020-03-23 DIAGNOSIS — F329 Major depressive disorder, single episode, unspecified: Secondary | ICD-10-CM | POA: Diagnosis present

## 2020-03-23 DIAGNOSIS — N390 Urinary tract infection, site not specified: Secondary | ICD-10-CM | POA: Diagnosis present

## 2020-03-23 DIAGNOSIS — R6520 Severe sepsis without septic shock: Secondary | ICD-10-CM | POA: Diagnosis present

## 2020-03-23 LAB — LACTIC ACID, PLASMA: Lactic Acid, Venous: 0.7 mmol/L (ref 0.5–1.9)

## 2020-03-23 LAB — COMPREHENSIVE METABOLIC PANEL
ALT: 68 U/L — ABNORMAL HIGH (ref 0–44)
AST: 41 U/L (ref 15–41)
Albumin: 2.7 g/dL — ABNORMAL LOW (ref 3.5–5.0)
Alkaline Phosphatase: 144 U/L — ABNORMAL HIGH (ref 38–126)
Anion gap: 7 (ref 5–15)
BUN: 49 mg/dL — ABNORMAL HIGH (ref 8–23)
CO2: 19 mmol/L — ABNORMAL LOW (ref 22–32)
Calcium: 8 mg/dL — ABNORMAL LOW (ref 8.9–10.3)
Chloride: 109 mmol/L (ref 98–111)
Creatinine, Ser: 1.69 mg/dL — ABNORMAL HIGH (ref 0.44–1.00)
GFR calc Af Amer: 31 mL/min — ABNORMAL LOW (ref >60)
GFR calc non Af Amer: 26 mL/min — ABNORMAL LOW (ref >60)
Glucose, Bld: 117 mg/dL — ABNORMAL HIGH (ref 70–99)
Potassium: 4.1 mmol/L (ref 3.5–5.1)
Sodium: 135 mmol/L (ref 135–145)
Total Bilirubin: 0.4 mg/dL (ref 0.3–1.2)
Total Protein: 5.6 g/dL — ABNORMAL LOW (ref 6.5–8.1)

## 2020-03-23 LAB — CBC
HCT: 25.3 % — ABNORMAL LOW (ref 36.0–46.0)
Hemoglobin: 8.3 g/dL — ABNORMAL LOW (ref 12.0–15.0)
MCH: 33.6 pg (ref 26.0–34.0)
MCHC: 32.8 g/dL (ref 30.0–36.0)
MCV: 102.4 fL — ABNORMAL HIGH (ref 80.0–100.0)
Platelets: 37 10*3/uL — ABNORMAL LOW (ref 150–400)
RBC: 2.47 MIL/uL — ABNORMAL LOW (ref 3.87–5.11)
RDW: 15.5 % (ref 11.5–15.5)
WBC: 7.8 10*3/uL (ref 4.0–10.5)
nRBC: 0 % (ref 0.0–0.2)

## 2020-03-23 LAB — SARS CORONAVIRUS 2 BY RT PCR (HOSPITAL ORDER, PERFORMED IN ~~LOC~~ HOSPITAL LAB): SARS Coronavirus 2: NEGATIVE

## 2020-03-23 MED ORDER — METHOCARBAMOL 500 MG PO TABS
750.0000 mg | ORAL_TABLET | Freq: Four times a day (QID) | ORAL | Status: DC | PRN
  Administered 2020-03-24: 750 mg via ORAL
  Filled 2020-03-23: qty 2

## 2020-03-23 NOTE — Plan of Care (Signed)

## 2020-03-23 NOTE — Progress Notes (Signed)
Patient arrived to floor in hospital bed, CHL in downtime, patient settled into room, welcome packet given, explained call bell use, VSS, no complaints at this time, foley draining well, iv fluids placed on pump and infused at 75/hr per ER report, admission questions completed- will place in [facility] when system back online, bed locked and low, bed alarm on, pt with hx of dementia but A&Ox4 at this time, will continue to monitor.

## 2020-03-23 NOTE — Progress Notes (Signed)
PROGRESS NOTE  Alison Bridges MVH:[id number] DOB: 1928/08/08 DOA: 03/22/2020 PCP: Alison Bunting, MD  Brief History   The patient is a 86 yr old woman with a medical history significant for MDS. She had a transfusion on 03/17/2020. On 03/20/2020 she developed fevers and chills. Temperature was 102. She went to ED and had labs drawn, but left without being seen. She went for her iron transfusion yesterday, felt weak and dizzy so went to her PCP where she tested negative for COVID. She returned to ED complaining of suprapubic pain, low grade temperature, and chills. She is fully vaccinated for COVID.  In the ED she was found to have UTI, AKI, and pancytopenia.  [facility] hospital[facility] were consulted to admit the patient for further evaluation and care.  The patient has been admitted to a med surg bed. She is receiving IV Rocephin.  Consultants  . None  Procedures  . None  Antibiotics   Anti-infectives (From admission, onward)   Start     Dose/Rate Route Frequency Ordered Stop   03/23/20 2000  cefTRIAXone (ROCEPHIN) 1 g in sodium chloride 0.9 % 100 mL IVPB        1 g 200 mL/hr over 30 Minutes Intravenous Every 24 hours 03/22/20 2307     03/22/20 2015  cefTRIAXone (ROCEPHIN) 1 g in sodium chloride 0.9 % 100 mL IVPB        1 g 200 mL/hr over 30 Minutes Intravenous  Once 03/22/20 2006 03/22/20 2042    .  Subjective  The patient is resting comfortably. No new complaints.   Objective   Vitals:  Vitals:   03/23/20 0931 03/23/20 1338  BP: (!) 124/54 (!) 134/59  Pulse: 88 85  Resp: 20 18  Temp: 98.9 F (37.2 C)   SpO2: 94% 94%    Exam:  Constitutional:  . The patient is awake, alert, and oriented x 3. No acute distress. Respiratory:  . No increased work of breathing. . No wheezes, rales, or rhonchi . No tactile fremitus Cardiovascular:  . Regular rate and rhythm . No murmurs, ectopy, or gallups. . No lateral PMI. No thrills. Abdomen:  . Abdomen is soft, non-tender,  non-distended . No hernias, masses, or organomegaly . Normoactive bowel sounds.  Musculoskeletal:  . No cyanosis, clubbing, or edema Skin:  . No rashes, lesions, ulcers . palpation of skin: no induration or nodules Neurologic:  . CN 2-12 intact . Sensation all 4 extremities intact Psychiatric:  . Mental status o Mood, affect appropriate o Orientation to person, place, time  . judgment and insight appear intact   I have personally reviewed the following:   Today's Data  . Vitals, CBC, CMP  Micro Data  . Blood Culture x 2 (03/22/2020): No growth . Urine culture: No growth  Scheduled Meds: . cholecalciferol  1,000 Units Oral Daily  . levothyroxine  125 mcg Oral QAC breakfast  . multivitamin with minerals  1 tablet Oral Daily  . sertraline  100 mg Oral Daily   Continuous Infusions: . sodium chloride 75 mL/hr at 03/23/20 0600  . cefTRIAXone (ROCEPHIN)  IV      Principal Problem:   Acute lower UTI Active Problems:   Pancytopenia ([place])   MDS (myelodysplastic syndrome) ([place])   AKI (acute kidney injury) ([place])   Acute urinary retention   LOS: 0 days   A & P    UTI: The patient is receiving IV Rocephin. Urine cultures has had no growth. Blood cultures have had no growth.  Likely due to urinary retention.  Urinary retention: Foley catheter placed. Will start the patient on flomax and attempt to remove with voiding trial prior to discharge.  MDS: Noted. Pt with pancytopenia. In particular her platelets are very low at 37. Hgb down to 8.3. consider transfusion for Hgb less than 7.0. Anemia panel seems consistent with anemia of chronic disease moreso than iron deficiency.  AKI: Baseline creatinine appears to be about 0.90 - 1.0. Creatinine on admission 1.55. 1.69 this morning. Possibly in part due to urinary retention and poor PO intake. IV fluids cautiously. Will monitor creatinine, electrolytes, and volume status. Avoid nephrotoxins and hypotension.  Advanced age: Increases  risk of all cause morbidity and mortality.   I have seen and examined this patient myself. I have spent 35 minutes in her evaluation and care.  DVT Prophylaxis: SCD's. CODE STATUS: Full Code Family Communication: None available Disposition:  Status is: Inpatient  Remains inpatient appropriate because:IV treatments appropriate due to intensity of illness or inability to take PO   Dispo: The patient is from: Home              Anticipated d/c is to: Home              Anticipated d/c date is: 1 day              Patient currently is not medically stable to d/c.  [name], DO [facility] Direct contact: see [email or web address]  7PM-7AM contact night coverage as above 03/23/2020, 2:38 PM  LOS: 0 days

## 2020-03-24 LAB — CBC WITH DIFFERENTIAL/PLATELET
Abs Immature Granulocytes: 0.1 10*3/uL — ABNORMAL HIGH (ref 0.00–0.07)
Basophils Absolute: 0 10*3/uL (ref 0.0–0.1)
Basophils Relative: 0 %
Eosinophils Absolute: 0 10*3/uL (ref 0.0–0.5)
Eosinophils Relative: 0 %
HCT: 24.1 % — ABNORMAL LOW (ref 36.0–46.0)
Hemoglobin: 7.7 g/dL — ABNORMAL LOW (ref 12.0–15.0)
Immature Granulocytes: 2 %
Lymphocytes Relative: 12 %
Lymphs Abs: 0.6 10*3/uL — ABNORMAL LOW (ref 0.7–4.0)
MCH: 32.6 pg (ref 26.0–34.0)
MCHC: 32 g/dL (ref 30.0–36.0)
MCV: 102.1 fL — ABNORMAL HIGH (ref 80.0–100.0)
Monocytes Absolute: 1.3 10*3/uL — ABNORMAL HIGH (ref 0.1–1.0)
Monocytes Relative: 25 %
Neutro Abs: 3.2 10*3/uL (ref 1.7–7.7)
Neutrophils Relative %: 61 %
Platelets: 44 10*3/uL — ABNORMAL LOW (ref 150–400)
RBC: 2.36 MIL/uL — ABNORMAL LOW (ref 3.87–5.11)
RDW: 15.6 % — ABNORMAL HIGH (ref 11.5–15.5)
WBC: 5.3 10*3/uL (ref 4.0–10.5)
nRBC: 0 % (ref 0.0–0.2)

## 2020-03-24 LAB — BASIC METABOLIC PANEL
Anion gap: 9 (ref 5–15)
BUN: 38 mg/dL — ABNORMAL HIGH (ref 8–23)
CO2: 17 mmol/L — ABNORMAL LOW (ref 22–32)
Calcium: 8.1 mg/dL — ABNORMAL LOW (ref 8.9–10.3)
Chloride: 109 mmol/L (ref 98–111)
Creatinine, Ser: 1.3 mg/dL — ABNORMAL HIGH (ref 0.44–1.00)
GFR calc Af Amer: 42 mL/min — ABNORMAL LOW (ref >60)
GFR calc non Af Amer: 36 mL/min — ABNORMAL LOW (ref >60)
Glucose, Bld: 114 mg/dL — ABNORMAL HIGH (ref 70–99)
Potassium: 3.4 mmol/L — ABNORMAL LOW (ref 3.5–5.1)
Sodium: 135 mmol/L (ref 135–145)

## 2020-03-24 LAB — URINE CULTURE: Culture: NO GROWTH

## 2020-03-24 MED ORDER — POTASSIUM CHLORIDE CRYS ER 20 MEQ PO TBCR
40.0000 meq | EXTENDED_RELEASE_TABLET | Freq: Once | ORAL | Status: AC
  Administered 2020-03-24: 40 meq via ORAL
  Filled 2020-03-24: qty 2

## 2020-03-24 NOTE — Progress Notes (Signed)
PROGRESS NOTE  Alison Bridges YHC:[id number] DOB: [birth year] DOA: 03/22/2020 PCP: Burnard Bunting, MD  Brief History   The patient is a 86 yr old woman with a medical history significant for MDS. She had a transfusion on 03/17/2020. On 03/20/2020 she developed fevers and chills. Temperature was 102. She went to ED and had labs drawn, but left without being seen. She went for her iron transfusion yesterday, felt weak and dizzy so went to her PCP where she tested negative for COVID. She returned to ED complaining of suprapubic pain, low grade temperature, and chills. She is fully vaccinated for COVID.  In the ED she was found to have UTI, AKI, and pancytopenia.  [facility] were consulted to admit the patient for further evaluation and care.  The patient has been admitted to a med surg bed. She is receiving IV Rocephin.  Consultants  . None  Procedures  . None  Antibiotics   Anti-infectives (From admission, onward)   Start     Dose/Rate Route Frequency Ordered Stop   03/23/20 2000  cefTRIAXone (ROCEPHIN) 1 g in sodium chloride 0.9 % 100 mL IVPB        1 g 200 mL/hr over 30 Minutes Intravenous Every 24 hours 03/22/20 2307     03/22/20 2015  cefTRIAXone (ROCEPHIN) 1 g in sodium chloride 0.9 % 100 mL IVPB        1 g 200 mL/hr over 30 Minutes Intravenous  Once 03/22/20 2006 03/22/20 2042     Subjective  The patient is resting comfortably. She states that she feels very weak.   Objective   Vitals:  Vitals:   03/23/20 2215 03/24/20 0446  BP: (!) 126/55 136/68  Pulse: 79 84  Resp: 20 15  Temp: 98.3 F (36.8 C) 98.5 F (36.9 C)  SpO2: 95% 94%    Exam:  Constitutional:  . The patient is awake, alert, and oriented x 3. No acute distress. She appears weak and frail. Respiratory:  . No increased work of breathing. . No wheezes, rales, or rhonchi . No tactile fremitus Cardiovascular:  . Regular rate and rhythm . No murmurs, ectopy, or gallups. . No lateral PMI. No  thrills. Abdomen:  . Abdomen is soft, non-tender, non-distended . No hernias, masses, or organomegaly . Normoactive bowel sounds.  Musculoskeletal:  . No cyanosis, clubbing, or edema Skin:  . No rashes, lesions, ulcers . palpation of skin: no induration or nodules Neurologic:  . CN 2-12 intact . Sensation all 4 extremities intact Psychiatric:  . Mental status o Mood, affect appropriate o Orientation to person, place, time  . judgment and insight appear intact   I have personally reviewed the following:   Today's Data  . Vitals, CBC, BMP  Micro Data  . Blood Culture x 2 (03/22/2020): No growth . Urine culture: No growth  Scheduled Meds: . cholecalciferol  1,000 Units Oral Daily  . levothyroxine  125 mcg Oral QAC breakfast  . multivitamin with minerals  1 tablet Oral Daily  . sertraline  100 mg Oral Daily   Continuous Infusions: . sodium chloride 75 mL/hr at 03/24/20 0034  . cefTRIAXone (ROCEPHIN)  IV 1 g (03/23/20 2118)    Principal Problem:   Acute lower UTI Active Problems:   Pancytopenia ([place])   MDS (myelodysplastic syndrome) ([place])   AKI (acute kidney injury) ([place])   Acute urinary retention   Sepsis ([place])   LOS: 1 day   A & P    UTI: The patient  is receiving IV Rocephin. Urine cultures has had no growth. Blood cultures have had no growth. Likely due to urinary retention.  Urinary retention: Foley catheter placed. Will start the patient on flomax and attempt to remove with voiding trial prior to discharge.  MDS: Noted. Pt with pancytopenia. In particular her platelets are very low at 37. Hgb down to 8.3. consider transfusion for Hgb less than 7.0. Anemia panel seems consistent with anemia of chronic disease moreso than iron deficiency.  AKI: Baseline creatinine appears to be about 0.90 - 1.0. Creatinine on admission 1.55. creatinine peaked at 1.69. 1.30 this morning. Elevation of creatinine is possibly in part due to urinary retention and poor PO intake. IV  fluids cautiously. Will monitor creatinine, electrolytes, and volume status. Avoid nephrotoxins and hypotension.  Advanced age: Increases risk of all cause morbidity and mortality.   I have seen and examined this patient myself. I have spent 32 minutes in her evaluation and care.  DVT Prophylaxis: SCD's. CODE STATUS: Full Code Family Communication: None available Disposition:  Status is: Inpatient  Remains inpatient appropriate because:IV treatments appropriate due to intensity of illness or inability to take PO   Dispo: The patient is from: Home              Anticipated d/c is to: Home              Anticipated d/c date is: 1 day              Patient currently is not medically stable to d/c.  Alison Dunklee, DO [facility] Direct contact: see [email or web address]  7PM-7AM contact night coverage as above 03/24/2020, 2:22 PM  LOS: 0 days

## 2020-03-25 LAB — COMPREHENSIVE METABOLIC PANEL
ALT: 54 U/L — ABNORMAL HIGH (ref 0–44)
AST: 32 U/L (ref 15–41)
Albumin: 2.4 g/dL — ABNORMAL LOW (ref 3.5–5.0)
Alkaline Phosphatase: 142 U/L — ABNORMAL HIGH (ref 38–126)
Anion gap: 7 (ref 5–15)
BUN: 22 mg/dL (ref 8–23)
CO2: 19 mmol/L — ABNORMAL LOW (ref 22–32)
Calcium: 7.7 mg/dL — ABNORMAL LOW (ref 8.9–10.3)
Chloride: 112 mmol/L — ABNORMAL HIGH (ref 98–111)
Creatinine, Ser: 1.05 mg/dL — ABNORMAL HIGH (ref 0.44–1.00)
GFR calc Af Amer: 55 mL/min — ABNORMAL LOW (ref >60)
GFR calc non Af Amer: 47 mL/min — ABNORMAL LOW (ref >60)
Glucose, Bld: 98 mg/dL (ref 70–99)
Potassium: 4.2 mmol/L (ref 3.5–5.1)
Sodium: 138 mmol/L (ref 135–145)
Total Bilirubin: 0.3 mg/dL (ref 0.3–1.2)
Total Protein: 5.5 g/dL — ABNORMAL LOW (ref 6.5–8.1)

## 2020-03-25 MED ORDER — METHOCARBAMOL 750 MG PO TABS: 750.0000 mg | ORAL_TABLET | Freq: Four times a day (QID) | ORAL | 0 refills | Status: DC | PRN

## 2020-03-25 MED ORDER — AMOXICILLIN-POT CLAVULANATE 875-125 MG PO TABS: 1.0000 | ORAL_TABLET | Freq: Two times a day (BID) | ORAL | 0 refills | Status: AC

## 2020-03-25 NOTE — TOC Initial Note (Signed)
Transition of Care [facility]) - Initial/Assessment Note    Patient Details  Name: Alison Bridges MRN: [id number] Date of Birth: 1929/03/04  Transition of Care (TOC) CM/SW Contact:    Joaquin Courts, RN Phone Number: 03/25/2020, 2:21 PM  Clinical Narrative:    CM noted TOC consult for SNF.  CM spoke with MD who shares patient expressed hesitancy about returning home today where she lives alone.  CM spoke with patient who states she is hesitant because of her age and living alone, but does share that she and her son have made arrangements for her to move into [facility] independent living facility.  In fact, patient reports she already has an apartment arranged there and has been making her monthly payments but just has not made the physical move from her home to her new apartment.  CM did discuss that TOC was consulted for SNF placement and discussed patient's PT evaluation which indicates no PT follow up.  Patient shares that she does not want to go to SNF and feels that it is in her best interest to go ahead and transition to [facility] instead.  CM did discuss [place] services with the patient.  Patient states that additional assistance services are available at [facility] as well and if needed she will make arrangements to hire additional help.  Patient reports her son can assist her with the move into her new apartment.  NO further TOC needs identified.  Patient plans to dc home today and then transition into her independent living facility.               Expected Discharge Plan: Home/Self Care Barriers to Discharge: No Barriers Identified   Patient Goals and CMS Choice Patient states their goals for this hospitalization and ongoing recovery are:: to go home and move to [facility]      Expected Discharge Plan and Services Expected Discharge Plan: Home/Self Care   Discharge Planning Services: CM Consult   Living arrangements for the past 2 months: Single Family Home Expected Discharge  Date: 03/25/20               DME Arranged: N/A DME Agency: NA       HH Arranged: NA HH Agency: NA        Prior Living Arrangements/Services Living arrangements for the past 2 months: Single Family Home Lives with:: Self Patient language and need for interpreter reviewed:: Yes Do you feel safe going back to the place where you live?: Yes      Need for Family Participation in Patient Care: Yes (Comment) Care giver support system in place?: Yes (comment)   Criminal Activity/Legal Involvement Pertinent to Current Situation/Hospitalization: No - Comment as needed  Activities of Daily Living Home Assistive Devices/Equipment: Cane (specify quad or straight) (quad) ADL Screening (condition at time of admission) Patient's cognitive ability adequate to safely complete daily activities?: Yes Is the patient deaf or have difficulty hearing?: No Does the patient have difficulty seeing, even when wearing glasses/contacts?: No Does the patient have difficulty concentrating, remembering, or making decisions?: No Patient able to express need for assistance with ADLs?: Yes Does the patient have difficulty dressing or bathing?: No Independently performs ADLs?: Yes (appropriate for developmental age) Does the patient have difficulty walking or climbing stairs?: No Weakness of Legs: None Weakness of Arms/Hands: None  Permission Sought/Granted                  Emotional Assessment Appearance:: Appears stated age Attitude/Demeanor/Rapport: Engaged Affect (  typically observed): Accepting Orientation: : Oriented to Self, Oriented to Place, Oriented to  Time, Oriented to Situation   Psych Involvement: No (comment)  Admission diagnosis:  Urinary retention [R33.9] Elevated LFTs [R79.89] AKI (acute kidney injury) ([place]) [N17.9] Sepsis ([place]) [A41.9] Patient Active Problem List   Diagnosis Date Noted  . Sepsis ([place]) 03/23/2020  . AKI (acute kidney injury) ([place]) 03/22/2020  . Acute urinary  retention 03/22/2020  . Acute lower UTI 03/22/2020  . MDS (myelodysplastic syndrome) ([place]) 03/13/2020  . Anemia 03/13/2020  . Cellulitis of right ankle 12/08/2019  . Pancytopenia ([place]) 12/08/2019  . Palpitations 11/25/2017   PCP:  Burnard Bunting, MD Pharmacy:   [facility] [address] [place] - [address] [place] [address] Phone: [phone] Fax: [phone]     Social Determinants of Health (SDOH) Interventions    Readmission Risk Interventions No flowsheet data found.

## 2020-03-25 NOTE — Progress Notes (Signed)
Dc instructions provided and explained in detail to patient and patient's son who both verbalized understanding; patient escorted to lobby in wheel chair to be discharged to home with son.

## 2020-03-25 NOTE — Progress Notes (Signed)
Physical Therapy Treatment Patient Details Name: Alison Bridges MRN: [id number] DOB: 04-29-1929 Today's Date: 03/25/2020    History of Present Illness Pt admitted with c/o weakness, fever, nausea and found to have UTI, pancytopenia, and AKI.Marland Kitchen  Pt with hx of osteoporosis and MDS with most recent transfusion 03/17/20.    PT Comments    Pt up to ambulate increased distance in [place] with O2 monitored entire time - pt on RA with SaO2 95% or higher and max HR 103.  Follow Up Recommendations  No PT follow up     Equipment Recommendations  None recommended by PT    Recommendations for Other Services       Precautions / Restrictions Precautions Precautions: Fall Restrictions Weight Bearing Restrictions: No    Mobility  Bed Mobility Overal bed mobility: Modified Independent             General bed mobility comments: Pt MOD I EOB sitting to supine  Transfers Overall transfer level: Modified independent Equipment used: None             General transfer comment: Increased time but no physical assist  Ambulation/Gait Ambulation/Gait assistance: Min guard;Supervision;Modified independent (Device/Increase time) Gait Distance (Feet): 400 Feet Assistive device: Quad cane Gait Pattern/deviations: Step-through pattern;Decreased step length - right;Decreased step length - left;Shuffle     General Gait Details: mild initial instabilty but no LOB and with good safety awareness including with stepping bkwd and sideways.  Pt utilizing SBQC minimally    Stairs Stairs: Yes Stairs assistance: Min guard Stair Management: Two rails;Step to pattern;Forwards Number of Stairs: 3     Wheelchair Mobility    Modified Rankin (Stroke Patients Only)       Balance Overall balance assessment: Mild deficits observed, not formally tested                                          Cognition Arousal/Alertness: Awake/alert Behavior During Therapy: WFL for tasks  assessed/performed Overall Cognitive Status: Within Functional Limits for tasks assessed                                        Exercises      General Comments        Pertinent Vitals/Pain Pain Assessment: No/denies pain    Home Living Family/patient expects to be discharged to:: Private residence Living Arrangements: Alone Available Help at Discharge: Family Type of Home: House Home Access: Stairs to enter Entrance Stairs-Rails: Can reach both Home Layout: One level Home Equipment: [place] - quad;Shower seat;Grab bars - tub/shower Additional Comments: Pt states son will pick her up and she will ask him to stay overnight    Prior Function Level of Independence: Independent      Comments: pt uses SBQC at night to go to the bathroom, she does not use it during the day. prior to [place] she was exercising 2-3x/week at community center doing the "AHOY" classes (Add Health to Your Years).    PT Goals (current goals can now be found in the care plan section) Acute Rehab PT Goals Patient Stated Goal: HOME PT Goal Formulation: With patient Time For Goal Achievement: 04/08/20 Potential to Achieve Goals: Good Progress towards PT goals: Progressing toward goals    Frequency    Min 3X/week  PT Plan Current plan remains appropriate    Co-evaluation              AM-PAC PT "6 Clicks" Mobility   Outcome Measure  Help needed turning from your back to your side while in a flat bed without using bedrails?: None Help needed moving from lying on your back to sitting on the side of a flat bed without using bedrails?: None Help needed moving to and from a bed to a chair (including a wheelchair)?: A Little Help needed standing up from a chair using your arms (e.g., wheelchair or bedside chair)?: A Little Help needed to walk in hospital room?: A Little Help needed climbing 3-5 steps with a railing? : A Little 6 Click Score: 20    End of Session Equipment  Utilized During Treatment: Gait belt Activity Tolerance: Patient tolerated treatment well Patient left: with call bell/phone within reach;in bed;with bed alarm set Nurse Communication: Mobility status PT Visit Diagnosis: Difficulty in walking, not elsewhere classified (R26.2)     Time: 8185-6314 PT Time Calculation (min) (ACUTE ONLY): 14 min  Charges:  $Gait Training: 8-22 mins                     [place] Pager [phone] Office [phone]    Sumer Moorehouse 03/25/2020, 3:57 PM

## 2020-03-25 NOTE — Evaluation (Signed)
Physical Therapy Evaluation Patient Details Name: Alison Bridges MRN: [id number] DOB: 12-18-1928 Today's Date: 03/25/2020   History of Present Illness  Pt admitted with c/o weakness, fever, nausea and found to have UTI, pancytopenia, and AKI.Marland Kitchen  Pt with hx of osteoporosis and MDS with most recent transfusion 03/17/20.  Clinical Impression  Pt admitted as above and presenting with mild ambulatory balance deficits and decreased endurance ("I'm feeling a little winded" after ambulating 200+ feet) limiting functional mobility.  Pt should progress to dc home with initial assist of son.    Follow Up Recommendations No PT follow up    Equipment Recommendations  None recommended by PT    Recommendations for Other Services       Precautions / Restrictions Precautions Precautions: Fall Restrictions Weight Bearing Restrictions: No      Mobility  Bed Mobility               General bed mobility comments: NT, pt up in chair and requests back to same.  Pt reports min difficulty out of bed  Transfers Overall transfer level: Modified independent Equipment used: None             General transfer comment: Increased time but no physical assist  Ambulation/Gait Ambulation/Gait assistance: Min guard;Supervision;Modified independent (Device/Increase time) Gait Distance (Feet): 222 Feet Assistive device: Quad cane Gait Pattern/deviations: Step-through pattern;Decreased step length - right;Decreased step length - left;Shuffle     General Gait Details: mild initial instabilty but no LOB and with good safety awareness including with stepping bkwd and sideways.  Pt utilizing SBQC minimally   Stairs Stairs: Yes Stairs assistance: Min guard Stair Management: Two rails;Step to pattern;Forwards Number of Stairs: 3    Wheelchair Mobility    Modified Rankin (Stroke Patients Only)       Balance Overall balance assessment: Mild deficits observed, not formally tested                                            Pertinent Vitals/Pain Pain Assessment: No/denies pain    Home Living Family/patient expects to be discharged to:: Private residence Living Arrangements: Alone Available Help at Discharge: Family Type of Home: House Home Access: Stairs to enter Entrance Stairs-Rails: Can reach both Entrance Stairs-Number of Steps: 2 Home Layout: One level Home Equipment: [place] - quad;Shower seat;Grab bars - tub/shower Additional Comments: Pt states son will pick her up and she will ask him to stay overnight    Prior Function Level of Independence: Independent         Comments: pt uses SBQC at night to go to the bathroom, she does not use it during the day. prior to [place] she was exercising 2-3x/week at community center doing the "AHOY" classes (Add Health to Your Years).      Hand Dominance   Dominant Hand: Right    Extremity/Trunk Assessment   Upper Extremity Assessment Upper Extremity Assessment: Overall WFL for tasks assessed    Lower Extremity Assessment Lower Extremity Assessment: Overall WFL for tasks assessed    Cervical / Trunk Assessment Cervical / Trunk Assessment: Kyphotic  Communication   Communication: No difficulties  Cognition Arousal/Alertness: Awake/alert Behavior During Therapy: WFL for tasks assessed/performed Overall Cognitive Status: Within Functional Limits for tasks assessed  General Comments      Exercises     Assessment/Plan    PT Assessment Patient needs continued PT services  PT Problem List Decreased activity tolerance;Decreased balance       PT Treatment Interventions DME instruction;Gait training;Stair training;Functional mobility training;Therapeutic activities;Therapeutic exercise;Patient/family education    PT Goals (Current goals can be found in the Care Plan section)  Acute Rehab PT Goals Patient Stated Goal: HOME PT Goal Formulation:  With patient Time For Goal Achievement: 04/08/20 Potential to Achieve Goals: Good    Frequency Min 3X/week   Barriers to discharge        Co-evaluation               AM-PAC PT "6 Clicks" Mobility  Outcome Measure Help needed turning from your back to your side while in a flat bed without using bedrails?: None Help needed moving from lying on your back to sitting on the side of a flat bed without using bedrails?: None Help needed moving to and from a bed to a chair (including a wheelchair)?: A Little Help needed standing up from a chair using your arms (e.g., wheelchair or bedside chair)?: A Little Help needed to walk in hospital room?: A Little Help needed climbing 3-5 steps with a railing? : A Little 6 Click Score: 20    End of Session Equipment Utilized During Treatment: Gait belt Activity Tolerance: Patient tolerated treatment well Patient left: in chair;with call bell/phone within reach;with chair alarm set Nurse Communication: Mobility status PT Visit Diagnosis: Difficulty in walking, not elsewhere classified (R26.2)    Time: 1031-5945 PT Time Calculation (min) (ACUTE ONLY): 16 min   Charges:   PT Evaluation $PT Eval Low Complexity: 1 Low          [place] Pager [phone] Office [phone]   Gemini Beaumier 03/25/2020, 12:35 PM

## 2020-03-25 NOTE — Discharge Summary (Addendum)
Physician Discharge Summary  Alison Bridges YYQ:[id number] DOB: Jun 25, 1929 DOA: 03/22/2020  PCP: Burnard Bunting, MD  Admit date: 03/22/2020 Discharge date: 03/25/2020  Recommendations for Outpatient Follow-up:  1. Discharge patient to home. 2. Follow up with PCP in 7-10 days.  Discharge Diagnoses: Principal diagnosis is #1 1. Sepsis - Admitted with sepsis as she met criteria with fever, AKI, hypotension, and tachycardia due to UTI.  2. UTI 3. Urinary retention 4. AKI 5. MDS 6. Hyponatremia  Discharge Condition: Fair Disposition: Home   Diet recommendation: Heart healthy  Filed Weights   03/23/20 0121  Weight: 56.2 kg   History of present illness: Alison Bridges is a 86 y.o. female with medical history significant of MDS.  Pt had blood transfusion 8/13.  Fevers and chills on 8/16 up to 102 -> went to ED -> LWBS.  Went to PCP yesterday, COVID test neg (and patient fully vaccinated though she was worried about getting exposed while waiting on 8/16).  UA suspicious for UTI.  Pt in to ED today.   ED Course: Creat 1.88 up from 1.55 on 8/16 and 1.1 baseline.  BUN 58.  WBC nl, has thrombocytopenia with platelets of 38 (somewhat worse than recent baseline with MDS).  HGB 9.3.  CT abd/pelvis neg for acute pathology but pt having urinary retention with > 800cc in bladder, got straight cathed in ED.  UA with mod LE, 11-20 WBC.   [facility] were consulted to admit the patient for further evaluation and care.  Hospital Course:  The patient is a 86 yr old woman with a medical history significant for MDS. She had a transfusion on 03/17/2020. On 03/20/2020 she developed fevers and chills. Temperature was 102. She went to ED and had labs drawn, but left without being seen. She went for her iron transfusion yesterday, felt weak and dizzy so went to her PCP where she tested negative for COVID. She returned to ED complaining of suprapubic pain, low grade temperature, and  chills. She is fully vaccinated for COVID.  In the ED she was found to have UTI, AKI, and pancytopenia.  [facility] were consulted to admit the patient for further evaluation and care.  The patient has been admitted to a med surg bed. She has received 3 days of IV Rocephin.   She will be discharged to home today in fair condition.  Today's assessment: S: The patient is resting comfortably. No new complaints. O: Vitals:  Vitals:   03/25/20 0505 03/25/20 1258  BP: 129/62 (!) 155/76  Pulse: 80 79  Resp: 16 20  Temp: 98.5 F (36.9 C) 98.4 F (36.9 C)  SpO2: 93% 96%   Exam:  Constitutional:  . The patient is awake, alert, and oriented x 3. No acute distress. Respiratory:  . No increased work of breathing. . No wheezes, rales, or rhonchi . No tactile fremitus Cardiovascular:  . Regular rate and rhythm . No murmurs, ectopy, or gallups. . No lateral PMI. No thrills. Abdomen:  . Abdomen is soft, non-tender, non-distended . No hernias, masses, or organomegaly . Normoactive bowel sounds.  Musculoskeletal:  . No cyanosis, clubbing, or edema Skin:  . No rashes, lesions, ulcers . palpation of skin: no induration or nodules Neurologic:  . CN 2-12 intact . Sensation all 4 extremities intact Psychiatric:  . Mental status o Mood, affect appropriate o Orientation to person, place, time  . judgment and insight appear intact  Discharge Instructions  Discharge Instructions    Activity as tolerated -  No restrictions   Complete by: As directed    Call MD for:  temperature >100.4   Complete by: As directed    Diet - low sodium heart healthy   Complete by: As directed    Discharge instructions   Complete by: As directed    Discharge patient to home. Follow up with PCP in 7-10 days.   Increase activity slowly   Complete by: As directed      Allergies as of 03/25/2020   No Known Allergies     Medication List    STOP taking these medications   ibuprofen 200  MG tablet Commonly known as: ADVIL     TAKE these medications   amoxicillin-clavulanate 875-125 MG tablet Commonly known as: Augmentin Take 1 tablet by mouth 2 (two) times daily for 3 days.   cholecalciferol 1000 units tablet Commonly known as: VITAMIN D Take 1,000 Units by mouth daily.   ferrous sulfate 325 (65 FE) MG tablet Take 1 tablet (325 mg total) by mouth daily.   levothyroxine 125 MCG tablet Commonly known as: SYNTHROID Take 125 mcg by mouth daily before breakfast.   methocarbamol 750 MG tablet Commonly known as: ROBAXIN Take 1 tablet (750 mg total) by mouth every 6 (six) hours as needed for muscle spasms.   MiraLax 17 g packet Generic drug: polyethylene glycol Take 17 g by mouth daily as needed for mild constipation.   multivitamin with minerals Tabs tablet Take 1 tablet by mouth daily.   pantoprazole 40 MG tablet Commonly known as: PROTONIX Take 40 mg by mouth daily as needed (acid reflux).   sertraline 100 MG tablet Commonly known as: ZOLOFT Take 100 mg by mouth daily.      No Known Allergies  The results of significant diagnostics from this hospitalization (including imaging, microbiology, ancillary and laboratory) are listed below for reference.    Significant Diagnostic Studies: DG Chest Port 1 View  Result Date: 03/22/2020 CLINICAL DATA:  Weakness, shortness of breath, urinary tract infection EXAM: PORTABLE CHEST 1 VIEW COMPARISON:  06/03/2019 FINDINGS: Single frontal view of the chest demonstrates a stable cardiac silhouette. Atherosclerosis aortic arch unchanged. No airspace disease, effusion, or pneumothorax. No acute bony abnormalities. IMPRESSION: 1. Stable exam, no acute process. Electronically Signed   By: Randa Ngo M.D.   On: 03/22/2020 19:29   CT Renal Stone Study  Result Date: 03/22/2020 CLINICAL DATA:  86 year old female with flank pain. Concern for kidney stone. EXAM: CT ABDOMEN AND PELVIS WITHOUT CONTRAST TECHNIQUE: Multidetector  CT imaging of the abdomen and pelvis was performed following the standard protocol without IV contrast. COMPARISON:  None. FINDINGS: Evaluation of this exam is limited in the absence of intravenous contrast. Lower chest: There is diffuse bibasilar interstitial prominence, likely edema. There is mild cardiomegaly. Coronary vascular calcification noted. There is hypoattenuation of the cardiac blood pool suggestive of anemia. Clinical correlation is recommended. There is no intra-abdominal free air or free fluid. Hepatobiliary: The liver is mildly enlarged. There is slight irregularity of the liver contour may represent changes of cirrhosis. No intrahepatic biliary dilatation. Cholecystectomy. No retained calcified stone in the central CBD. Pancreas: Unremarkable. No pancreatic ductal dilatation or surrounding inflammatory changes. Spleen: Mild splenomegaly measuring approximately 15 cm in length. Adrenals/Urinary Tract: The adrenal glands are unremarkable. Several small nonobstructing left renal calculi measure up to 6 mm in the inferior pole. Several adjacent small stones noted in the upper pole of the left kidney. No hydronephrosis. There is a punctate nonobstructing right renal  upper pole calculus. Left renal cysts measuring approximately 5 cm in the upper pole. Focal area of cortical defect and scarring involving the inferior pole of the right kidney with associated small cystic or low attenuating lesion as seen previously. Evaluation of the renal lesions are very limited and nondiagnostic due to respiratory motion artifact as well as in the absence of intravenous contrast. The visualized ureters and urinary bladder appear unremarkable. There is a small pocket of air within the urinary bladder, likely related related to recent instrumentation. Clinical correlation is recommended. Stomach/Bowel: There is a small hiatal hernia. Debris is noted in the distal esophagus which may represent reflux or delayed clearance.  There is moderate stool throughout the colon. Several small scattered colonic diverticula without active inflammatory changes. There is no bowel obstruction or active inflammation. The cecum is located in the left anterior abdomen. The appendix is not visualized with certainty. No inflammatory changes identified in the right lower quadrant. Vascular/Lymphatic: Advanced aortoiliac atherosclerotic disease. The IVC is unremarkable. No portal venous gas. There is no adenopathy. Reproductive: Hysterectomy. Other: Mild diffuse subcutaneous edema. Musculoskeletal: Osteopenia with degenerative changes of the spine. No acute osseous pathology. IMPRESSION: 1. Small nonobstructing bilateral renal calculi. No hydronephrosis. 2. Bilateral renal cysts. 3. Moderate colonic stool burden. No bowel obstruction. 4. Mild hepatosplenomegaly. 5. Aortic Atherosclerosis (ICD10-I70.0). Electronically Signed   By: Anner Crete M.D.   On: 03/22/2020 21:26    Microbiology: Recent Results (from the past 240 hour(s))  Blood culture (routine x 2)     Status: None (Preliminary result)   Collection Time: 03/22/20  7:37 PM   Specimen: BLOOD LEFT FOREARM  Result Value Ref Range Status   Specimen Description   Final    BLOOD LEFT FOREARM Performed at [facility], [place] [address], [place], [place] [address]    Special Requests   Final    BOTTLES DRAWN AEROBIC AND ANAEROBIC Blood Culture results may not be optimal due to an inadequate volume of blood received in culture bottles Performed at [facility] [address] [place], [place] [address]    Culture   Final    NO GROWTH 3 DAYS Performed at [facility], [place] [address], [place], [place] [address]    Report Status PENDING  Incomplete  Blood culture (routine x 2)     Status: None (Preliminary result)   Collection Time: 03/22/20  7:37 PM   Specimen: BLOOD  Result Value Ref Range Status   Specimen Description   Final    BLOOD  LEFT ANTECUBITAL Performed at [facility] [address] [place], [place] [address]    Special Requests   Final    BOTTLES DRAWN AEROBIC AND ANAEROBIC Blood Culture results may not be optimal due to an excessive volume of blood received in culture bottles Performed at [facility] [address], [place], [place] [address]    Culture   Final    NO GROWTH 3 DAYS Performed at [facility], [place] [address] [place], [place] [address]    Report Status PENDING  Incomplete  Urine Culture     Status: None   Collection Time: 03/22/20 10:00 PM   Specimen: Urine, Clean Catch  Result Value Ref Range Status   Specimen Description   Final    URINE, CLEAN CATCH Performed at [facility], [place] [address] [place], [place] [address]    Special Requests   Final  NONE Performed at [facility], [place] [address] [place], [place] [address]    Culture   Final    NO GROWTH Performed at [facility], [place] [address] [place], [place] [address]    Report Status 03/24/2020 FINAL  Final  SARS Coronavirus 2 by RT PCR (hospital order, performed in [facility] hospital lab) Nasopharyngeal Nasopharyngeal Swab     Status: None   Collection Time: 03/22/20 11:44 PM   Specimen: Nasopharyngeal Swab  Result Value Ref Range Status   SARS Coronavirus 2 NEGATIVE NEGATIVE Final    Comment: (NOTE) SARS-CoV-2 target nucleic acids are NOT DETECTED.  The SARS-CoV-2 RNA is generally detectable in upper and lower respiratory specimens during the acute phase of infection. The lowest concentration of SARS-CoV-2 viral copies this assay can detect is 250 copies / mL. A negative result does not preclude SARS-CoV-2 infection and should not be used as the sole basis for treatment or other patient management decisions.  A negative result may occur with improper specimen collection / handling, submission of specimen other than  nasopharyngeal swab, presence of viral mutation(s) within the areas targeted by this assay, and inadequate number of viral copies (<250 copies / mL). A negative result must be combined with clinical observations, patient history, and epidemiological information.  Fact Sheet for Patients:   StrictlyIdeas.no  Fact Sheet for Healthcare Providers: BankingDealers.co.za  This test is not yet approved or  cleared by the [place] FDA and has been authorized for detection and/or diagnosis of SARS-CoV-2 by FDA under an Emergency Use Authorization (EUA).  This EUA will remain in effect (meaning this test can be used) for the duration of the COVID-19 declaration under Section 564(b)(1) of the Act, 21 U.S.C. section 360bbb-3(b)(1), unless the authorization is terminated or revoked sooner.  Performed at [facility], [place] [address] [place], [place] [address]      Labs: Basic Metabolic Panel: Recent Labs  Lab 03/20/20 1558 03/22/20 1937 03/23/20 0412 03/24/20 0242 03/25/20 0259  NA 133* 127* 135 135 138  K 4.2 4.1 4.1 3.4* 4.2  CL 102 99 109 109 112*  CO2 20* 16* 19* 17* 19*  GLUCOSE 113* 148* 117* 114* 98  BUN 38* 58* 49* 38* 22  CREATININE 1.55* 1.88* 1.69* 1.30* 1.05*  CALCIUM 8.9 8.5* 8.0* 8.1* 7.7*   Liver Function Tests: Recent Labs  Lab 03/20/20 1558 03/22/20 1937 03/23/20 0412 03/25/20 0259  AST 32 67* 41 32  ALT 41 89* 68* 54*  ALKPHOS 87 193* 144* 142*  BILITOT 0.6 1.1 0.4 0.3  PROT 7.1 6.9 5.6* 5.5*  ALBUMIN 3.6 3.2* 2.7* 2.4*   No results for input(s): LIPASE, AMYLASE in the last 168 hours. No results for input(s): AMMONIA in the last 168 hours. CBC: Recent Labs  Lab 03/20/20 1558 03/22/20 1937 03/23/20 0412 03/24/20 0242  WBC 7.3 7.1 7.8 5.3  NEUTROABS 3.8 5.6  --  3.2  HGB 9.8* 9.3* 8.3* 7.7*  HCT 31.0* 27.7* 25.3* 24.1*  MCV 105.1* 100.0 102.4* 102.1*  PLT 57* 38* 37* 44*    Cardiac Enzymes: No results for input(s): CKTOTAL, CKMB, CKMBINDEX, TROPONINI in the last 168 hours. BNP: BNP (last 3 results) No results for input(s): BNP in the last 8760 hours.  ProBNP (last 3 results) No results for input(s): PROBNP in the last 8760 hours.  CBG: No results for input(s): GLUCAP in the last 168 hours.  Principal Problem:   Acute lower UTI Active Problems:  Pancytopenia ([place])   MDS (myelodysplastic syndrome) ([place])   AKI (acute kidney injury) ([place])   Acute urinary retention   Sepsis ([place])  Time coordinating discharge: 38 minutes.  Signed:        Joniqua Sidle, DO [facility]  03/25/2020, 5:57 PM

## 2020-03-27 LAB — CULTURE, BLOOD (ROUTINE X 2)
Culture: NO GROWTH
Culture: NO GROWTH

## 2020-03-28 ENCOUNTER — Ambulatory Visit: Payer: Medicare Other

## 2020-04-03 DIAGNOSIS — R600 Localized edema: Secondary | ICD-10-CM | POA: Diagnosis not present

## 2020-04-03 DIAGNOSIS — G6281 Critical illness polyneuropathy: Secondary | ICD-10-CM | POA: Diagnosis not present

## 2020-04-03 DIAGNOSIS — N179 Acute kidney failure, unspecified: Secondary | ICD-10-CM | POA: Diagnosis not present

## 2020-04-03 DIAGNOSIS — R6520 Severe sepsis without septic shock: Secondary | ICD-10-CM | POA: Diagnosis not present

## 2020-04-03 DIAGNOSIS — A419 Sepsis, unspecified organism: Secondary | ICD-10-CM | POA: Diagnosis not present

## 2020-04-03 DIAGNOSIS — N390 Urinary tract infection, site not specified: Secondary | ICD-10-CM | POA: Diagnosis not present

## 2020-04-03 DIAGNOSIS — E871 Hypo-osmolality and hyponatremia: Secondary | ICD-10-CM | POA: Diagnosis not present

## 2020-04-03 DIAGNOSIS — R634 Abnormal weight loss: Secondary | ICD-10-CM | POA: Diagnosis not present

## 2020-04-03 DIAGNOSIS — D469 Myelodysplastic syndrome, unspecified: Secondary | ICD-10-CM | POA: Diagnosis not present

## 2020-04-03 DIAGNOSIS — R531 Weakness: Secondary | ICD-10-CM | POA: Diagnosis not present

## 2020-04-11 NOTE — Progress Notes (Signed)
HEMATOLOGY/ONCOLOGY CLINIC NOTE  Date of Service: 04/12/2020  Patient Care Team: Burnard Bunting, MD as PCP - General (Internal Medicine)  REFERRING PHYSICIAN: Burnard Bunting, MD  CHIEF COMPLAINTS/PURPOSE OF CONSULTATION:  Pancytopenia  HISTORY OF PRESENTING ILLNESS:  Alison Bridges is a wonderful 86 y.o. female who has been referred to [place] by Burnard Bunting, MD for evaluation and management of Pancytopenia. The pt reports that she is doing well overall.   The pt reports she is good. Pt lives alone. Prior to the pandemic she was taking a workout class. About the last 3 months pt has had fatigue, loss of appetite, and weight loss. Last October pt had fatigue, SOB, and was spitting up blood. She has acid reflux and had esophagus dilated twice. A week ago pt was in the hospital due to infection in her right ankle. Her ankle started swelling and turning red. The ankle was not painful but did have burning and tightness. She was put on antibiotics and iron.  Pt uses partial dentures and does not use denture cream. She has been having a burning and hot feeling in feet and face that comes and goes. Her thyroid medication has been stable and she takes the pills several hours before eating with just water. Pt has loss about 10 pounds due to not having appetite. Recently she has had spontaneous bruising. She has had both doses of COVID19 vaccine. Pt had been taking 50mg  of zync for 4-6weeks prior to hospitalization.   Of note prior to the patient's visit today, pt has had DG Ankle Complete Right ([id number]) completed on 12/08/19 with results revealing "Soft tissue swelling without acute osseous finding." Pt has had Lower Venous DVT Study completed on 12/09/19 with results revealing "RIGHT: There is no evidence of deep vein thrombosis in the lower extremity. No cystic structure found in the popliteal fossa. LEFT: No evidence of common femoral vein obstruction." Pt has had CT Abdomen Pelvis W IV  Contrast completed on 10/07/19 with results revealing, " 1. No acute finding 2. Areas of enhancement in the right bladder worrisome for neoplasm 3. Cholecystectomy 4. Bilateral renal cortical cysts and nonobstructive left renal calculi. 5. Aortoiliac atherosclerosis without aneurysm. 6. Hysterectomy 7. Diverticulosis without diverticulitis. 8. Lumbar DDD and DJD with degenerative anterolisthesis of L5 on S1"  Most recent lab results (12/10/19) of CBC is as follows: all values are WNL except for WBC at 2.9K, RBC at 2.35, Hemoglobin at 8.2, HCT at 25.1, MCV at 106.8, MCH at 34.9, Platelets at 66K, Neutro Abs at 0.6K, Monocytes Absolute at 1.4K, Glucose at 105, Calcium at 8.4, GFR, Calc Non Af Amer at 54 12/08/19 of Lactic Acid, Venous at 1.3 12/08/19 of CRP at 0.8: WNL 12/08/19 of Reticulocytes is as follows: all values are WNL except for RC at 2.52, Immature Retic Fract at 16.9 12/08/19 of Ferritin at 167: WNL 12/08/19 of Iron and TIBC is as follows: all values are WNL except for Iron at 27, Saturation Ratios at 10 12/08/19 of Folate at 52.3: WNL 12/08/19 of Vitamin B12 at 931 12/08/19 of Sed Rate at 60  On review of systems, pt reports fatigue, lack of appetite, burning/hot sensation in face and feet, weight loss  and denies lumps/bumps, skin rashes, bone pain, fever, chills, night sweats, changes in bowl habits and urinary habits, nose bleeds, gum bleeds, blood in urine/stool, lightheaded, dizzy, changes in breathing, back pain, constipation and any other symptoms.   INTERVAL HISTORY:  Alison Bridges is a wonderful  86 y.o. female who is here for evaluation and management of Pancytopenia - likely from MDS +/- MPN. The patient's last visit with [place] was on 03/13/2020. The pt reports that she is doing well overall.  The pt reports that she recently went to the ED for a UTI. She does not remember receiving a blood transfusion while in the hospital. She was treated and had a follow-up with her PCP, but  has not heard back about whether her infection resolved. Pt is currently moving into an assisted living facility. She is taking a B-complex and multivitamin daily.   Lab results today (04/12/20) of CBC w/diff and CMP is as follows: all values are WNL except for WBC at 2.2K, RBC at 2.79, Hgb at 9.1, HCT at 28.1, MCV at 100.7, RDW at 17.1, PLT at 94K, Neutro Abs at 0.4K, Abs Immature Granulocytes at 0.08K, Glucose at 139, BUN at 29, Creatinine at 1.11, GFR Est Non Af Am at 44. 04/12/2020 Iron Panel is as follows: all values are WNL 04/12/2020 Ferritin at 801  On review of systems, pt reports dark urine, fatigue, constipation and denies dysuria, hematuria and any other symptoms.   MEDICAL HISTORY:  Past Medical History:  Diagnosis Date  . Depression   . Hypothyroidism   . Osteoporosis    SURGICAL HISTORY: Past Surgical History:  Procedure Laterality Date  . ABDOMINAL HYSTERECTOMY    . APPENDECTOMY  1998  . CHOLECYSTECTOMY  1998   SOCIAL HISTORY: Social History   Socioeconomic History  . Marital status: Widowed    Spouse name: Not on file  . Number of children: Not on file  . Years of education: Not on file  . Highest education level: Not on file  Occupational History  . Occupation: Oncologist    Comment: Retired  Tobacco Use  . Smoking status: Never Smoker  . Smokeless tobacco: Never Used  Substance and Sexual Activity  . Alcohol use: No  . Drug use: No  . Sexual activity: Not on file  Other Topics Concern  . Not on file  Social History Narrative   She is tried to stay active --  2 days a week she does senior aerobics.  She also enjoys going for walks, but no standard routine.      She has 3 children and 2 grandchildren.   Social Determinants of Health   Financial Resource Strain:   . Difficulty of Paying Living Expenses: Not on file  Food Insecurity: No Food Insecurity  . Worried About Charity fundraiser in the Last Year: Never true  . Ran Out of Food in  the Last Year: Never true  Transportation Needs: No Transportation Needs  . Lack of Transportation (Medical): No  . Lack of Transportation (Non-Medical): No  Physical Activity:   . Days of Exercise per Week: Not on file  . Minutes of Exercise per Session: Not on file  Stress:   . Feeling of Stress : Not on file  Social Connections:   . Frequency of Communication with Friends and Family: Not on file  . Frequency of Social Gatherings with Friends and Family: Not on file  . Attends Religious Services: Not on file  . Active Member of Clubs or Organizations: Not on file  . Attends Archivist Meetings: Not on file  . Marital Status: Not on file  Intimate Partner Violence:   . Fear of Current or Ex-Partner: Not on file  . Emotionally Abused: Not on file  .  Physically Abused: Not on file  . Sexually Abused: Not on file     FAMILY HISTORY: Family History  Problem Relation Age of Onset  . Heart attack Mother 19  . Pancreatic cancer Father   . Parkinson's disease Sister   . Diabetes Brother        Multiple complications  . Heart disease Brother        Not sure what happened  . Dementia Brother      ALLERGIES:   has No Known Allergies.   MEDICATIONS:  Current Outpatient Medications  Medication Sig Dispense Refill  . cholecalciferol (VITAMIN D) 1000 UNITS tablet Take 1,000 Units by mouth daily.    . ferrous sulfate 325 (65 FE) MG tablet Take 1 tablet (325 mg total) by mouth daily. (Patient not taking: Reported on 03/22/2020) 30 tablet 1  . levothyroxine (SYNTHROID, LEVOTHROID) 125 MCG tablet Take 125 mcg by mouth daily before breakfast.    . methocarbamol (ROBAXIN) 750 MG tablet Take 1 tablet (750 mg total) by mouth every 6 (six) hours as needed for muscle spasms. 20 tablet 0  . Multiple Vitamin (MULTIVITAMIN WITH MINERALS) TABS tablet Take 1 tablet by mouth daily.    . pantoprazole (PROTONIX) 40 MG tablet Take 40 mg by mouth daily as needed (acid reflux).     .  polyethylene glycol (MIRALAX) 17 g packet Take 17 g by mouth daily as needed for mild constipation.    . sertraline (ZOLOFT) 100 MG tablet Take 100 mg by mouth daily.     No current facility-administered medications for this visit.     REVIEW OF SYSTEMS:   A 10+ POINT REVIEW OF SYSTEMS WAS OBTAINED including neurology, dermatology, psychiatry, cardiac, respiratory, lymph, extremities, GI, GU, Musculoskeletal, constitutional, breasts, reproductive, HEENT.  All pertinent positives are noted in the HPI.  All others are negative.   PHYSICAL EXAMINATION: ECOG PERFORMANCE STATUS: 2 - Symptomatic, <50% confined to bed  Vitals:   04/12/20 1008  BP: (!) 122/55  Pulse: 86  Resp: 18  Temp: (!) 97.2 F (36.2 C)  SpO2: 98%   Filed Weights   04/12/20 1008  Weight: 117 lb 6.4 oz (53.3 kg)   Body mass index is 17.85 kg/m.  NAD . Exam was given in a chair   GENERAL:alert, in no acute distress and comfortable SKIN: no acute rashes, no significant lesions EYES: conjunctiva are pink and non-injected, sclera anicteric OROPHARYNX: MMM, no exudates, no oropharyngeal erythema or ulceration NECK: supple, no JVD LYMPH:  no palpable lymphadenopathy in the cervical, axillary or inguinal regions LUNGS: clear to auscultation b/l with normal respiratory effort HEART: regular rate & rhythm ABDOMEN:  normoactive bowel sounds , non tender, not distended. No palpable hepatosplenomegaly.  Extremity: no pedal edema PSYCH: alert & oriented x 3 with fluent speech NEURO: no focal motor/sensory deficits   LABORATORY DATA:  I have reviewed the data as listed  CBC Latest Ref Rng & Units 04/12/2020 03/24/2020 03/23/2020  WBC 4.0 - 10.5 K/uL 2.2(L) 5.3 7.8  Hemoglobin 12.0 - 15.0 g/dL 9.1(L) 7.7(L) 8.3(L)  Hematocrit 36 - 46 % 28.1(L) 24.1(L) 25.3(L)  Platelets 150 - 400 K/uL 94(L) 44(L) 37(L)    CMP Latest Ref Rng & Units 04/12/2020 03/25/2020 03/24/2020  Glucose 70 - 99 mg/dL 139(H) 98 114(H)  BUN 8 - 23  mg/dL 29(H) 22 38(H)  Creatinine 0.44 - 1.00 mg/dL 1.11(H) 1.05(H) 1.30(H)  Sodium 135 - 145 mmol/L 135 138 135  Potassium 3.5 - 5.1 mmol/L 4.1  4.2 3.4(L)  Chloride 98 - 111 mmol/L 104 112(H) 109  CO2 22 - 32 mmol/L 24 19(L) 17(L)  Calcium 8.9 - 10.3 mg/dL 9.0 7.7(L) 8.1(L)  Total Protein 6.5 - 8.1 g/dL 7.6 5.5(L) -  Total Bilirubin 0.3 - 1.2 mg/dL 0.4 0.3 -  Alkaline Phos 38 - 126 U/L 96 142(H) -  AST 15 - 41 U/L 22 32 -  ALT 0 - 44 U/L 23 54(H) -    01/10/2020 Cytogenetics [phone]):    01/10/2020 FISH Panel ([id number]):   01/10/2020 BM Bx Report ([id number]):    12/16/2019 JAK2, MPL, CALR Panel Report:    RADIOGRAPHIC STUDIES: I have personally reviewed the radiological images as listed and agreed with the findings in the report. DG Chest Port 1 View  Result Date: 03/22/2020 CLINICAL DATA:  Weakness, shortness of breath, urinary tract infection EXAM: PORTABLE CHEST 1 VIEW COMPARISON:  06/03/2019 FINDINGS: Single frontal view of the chest demonstrates a stable cardiac silhouette. Atherosclerosis aortic arch unchanged. No airspace disease, effusion, or pneumothorax. No acute bony abnormalities. IMPRESSION: 1. Stable exam, no acute process. Electronically Signed   By: Randa Ngo M.D.   On: 03/22/2020 19:29   CT Renal Stone Study  Result Date: 03/22/2020 CLINICAL DATA:  86 year old female with flank pain. Concern for kidney stone. EXAM: CT ABDOMEN AND PELVIS WITHOUT CONTRAST TECHNIQUE: Multidetector CT imaging of the abdomen and pelvis was performed following the standard protocol without IV contrast. COMPARISON:  None. FINDINGS: Evaluation of this exam is limited in the absence of intravenous contrast. Lower chest: There is diffuse bibasilar interstitial prominence, likely edema. There is mild cardiomegaly. Coronary vascular calcification noted. There is hypoattenuation of the cardiac blood pool suggestive of anemia. Clinical correlation is recommended. There is no  intra-abdominal free air or free fluid. Hepatobiliary: The liver is mildly enlarged. There is slight irregularity of the liver contour may represent changes of cirrhosis. No intrahepatic biliary dilatation. Cholecystectomy. No retained calcified stone in the central CBD. Pancreas: Unremarkable. No pancreatic ductal dilatation or surrounding inflammatory changes. Spleen: Mild splenomegaly measuring approximately 15 cm in length. Adrenals/Urinary Tract: The adrenal glands are unremarkable. Several small nonobstructing left renal calculi measure up to 6 mm in the inferior pole. Several adjacent small stones noted in the upper pole of the left kidney. No hydronephrosis. There is a punctate nonobstructing right renal upper pole calculus. Left renal cysts measuring approximately 5 cm in the upper pole. Focal area of cortical defect and scarring involving the inferior pole of the right kidney with associated small cystic or low attenuating lesion as seen previously. Evaluation of the renal lesions are very limited and nondiagnostic due to respiratory motion artifact as well as in the absence of intravenous contrast. The visualized ureters and urinary bladder appear unremarkable. There is a small pocket of air within the urinary bladder, likely related related to recent instrumentation. Clinical correlation is recommended. Stomach/Bowel: There is a small hiatal hernia. Debris is noted in the distal esophagus which may represent reflux or delayed clearance. There is moderate stool throughout the colon. Several small scattered colonic diverticula without active inflammatory changes. There is no bowel obstruction or active inflammation. The cecum is located in the left anterior abdomen. The appendix is not visualized with certainty. No inflammatory changes identified in the right lower quadrant. Vascular/Lymphatic: Advanced aortoiliac atherosclerotic disease. The IVC is unremarkable. No portal venous gas. There is no  adenopathy. Reproductive: Hysterectomy. Other: Mild diffuse subcutaneous edema. Musculoskeletal: Osteopenia with degenerative changes of the spine. No  acute osseous pathology. IMPRESSION: 1. Small nonobstructing bilateral renal calculi. No hydronephrosis. 2. Bilateral renal cysts. 3. Moderate colonic stool burden. No bowel obstruction. 4. Mild hepatosplenomegaly. 5. Aortic Atherosclerosis (ICD10-I70.0). Electronically Signed   By: Anner Crete M.D.   On: 03/22/2020 21:26     ASSESSMENT & PLAN:  RALIYAH MONTELLA is a 87 y.o. female with:   1. Pancytopenia - likely from MDS +/- MPN -01/10/2020 BM Bx Surgical Pathology Report ([id number]) revealed "BONE MARROW, ASPIRATE, CLOT, CORE: -Hypercellular marrow with myeloid hyperplasia and megakaryocytic atypia  PERIPHERAL BLOOD: - Pancytopenia". -12/16/19 JAK2 (including V617 and Exon 12), MPL, and CALR-Next Generation Sequencing shows "Tier II: Variants of Potential Clinical Significance - SRSF2 p.Pro95His" 2. Bladder lesion -- patient following with urology PLAN:  -Discussed pt labwork today, 04/12/20; WBC is dropping, PLT have improved, blood chemistries are stable, but reflect dehydration. -Discussed 04/12/2020 Ferritin at 801 -Discussed 04/12/2020 Iron Panel is as follows: all values are WNL -Advised pt that low WBC could be due to ongoing infection, antibiotics, or bone marrow disorder. Will keep a close watch on WBC counts. -IV Iron was placed on hold after pt's hospitalization. Will give tomorrow, with Aranesp following soon after. -Advised pt it's okay to hold PO Iron as we are replacing IV. -Will get a urine sample today to confirm absence of UTI. -Continue multivitamin and B-complex vitamin daily  -Will see back in 5 weeks with labs   FOLLOW UP: Return to lab for urine testing today RTC with Dr Irene Limbo with labs and 2nd dose of Aranesp in 5 weeks   The total time spent in the appt was 20 minutes and more than 50% was on counseling  and direct patient cares.  All of the patient's questions were answered with apparent satisfaction. The patient knows to call the clinic with any problems, questions or concerns.   Sullivan Lone MD [place] AAHIVMS [facility] Hematology/Oncology Physician [facility]  (Office):       [phone] (Work cell):  [phone] (Fax):           [phone]  04/12/2020 10:58 AM  I, Yevette Edwards, am acting as a scribe for Dr. Sullivan Lone.   .I have reviewed the above documentation for accuracy and completeness, and I agree with the above. [name] MD

## 2020-04-12 ENCOUNTER — Inpatient Hospital Stay: Payer: Medicare Other | Attending: Hematology

## 2020-04-12 ENCOUNTER — Inpatient Hospital Stay: Payer: Medicare Other

## 2020-04-12 ENCOUNTER — Inpatient Hospital Stay (HOSPITAL_BASED_OUTPATIENT_CLINIC_OR_DEPARTMENT_OTHER): Payer: Medicare Other | Admitting: Hematology

## 2020-04-12 ENCOUNTER — Other Ambulatory Visit: Payer: Self-pay

## 2020-04-12 VITALS — BP 122/55 | HR 86 | Temp 97.2°F | Resp 18 | Ht 68.0 in | Wt 117.4 lb

## 2020-04-12 DIAGNOSIS — D469 Myelodysplastic syndrome, unspecified: Secondary | ICD-10-CM | POA: Insufficient documentation

## 2020-04-12 DIAGNOSIS — R300 Dysuria: Secondary | ICD-10-CM

## 2020-04-12 DIAGNOSIS — E860 Dehydration: Secondary | ICD-10-CM | POA: Insufficient documentation

## 2020-04-12 DIAGNOSIS — D61818 Other pancytopenia: Secondary | ICD-10-CM | POA: Insufficient documentation

## 2020-04-12 DIAGNOSIS — Z87440 Personal history of urinary (tract) infections: Secondary | ICD-10-CM | POA: Diagnosis not present

## 2020-04-12 DIAGNOSIS — D649 Anemia, unspecified: Secondary | ICD-10-CM | POA: Diagnosis not present

## 2020-04-12 DIAGNOSIS — D72829 Elevated white blood cell count, unspecified: Secondary | ICD-10-CM | POA: Diagnosis not present

## 2020-04-12 DIAGNOSIS — Z90710 Acquired absence of both cervix and uterus: Secondary | ICD-10-CM | POA: Diagnosis not present

## 2020-04-12 DIAGNOSIS — N329 Bladder disorder, unspecified: Secondary | ICD-10-CM | POA: Diagnosis not present

## 2020-04-12 DIAGNOSIS — Z800 Family history of malignant neoplasm of digestive organs: Secondary | ICD-10-CM | POA: Insufficient documentation

## 2020-04-12 DIAGNOSIS — E611 Iron deficiency: Secondary | ICD-10-CM | POA: Insufficient documentation

## 2020-04-12 LAB — CBC WITH DIFFERENTIAL/PLATELET
Abs Immature Granulocytes: 0.08 10*3/uL — ABNORMAL HIGH (ref 0.00–0.07)
Basophils Absolute: 0 10*3/uL (ref 0.0–0.1)
Basophils Relative: 0 %
Eosinophils Absolute: 0 10*3/uL (ref 0.0–0.5)
Eosinophils Relative: 0 %
HCT: 28.1 % — ABNORMAL LOW (ref 36.0–46.0)
Hemoglobin: 9.1 g/dL — ABNORMAL LOW (ref 12.0–15.0)
Immature Granulocytes: 3 %
Lymphocytes Relative: 42 %
Lymphs Abs: 1 10*3/uL (ref 0.7–4.0)
MCH: 32.6 pg (ref 26.0–34.0)
MCHC: 32.4 g/dL (ref 30.0–36.0)
MCV: 100.7 fL — ABNORMAL HIGH (ref 80.0–100.0)
Monocytes Absolute: 0.9 10*3/uL (ref 0.1–1.0)
Monocytes Relative: 39 %
Neutro Abs: 0.4 10*3/uL — CL (ref 1.7–7.7)
Neutrophils Relative %: 16 %
Platelets: 94 10*3/uL — ABNORMAL LOW (ref 150–400)
RBC: 2.79 MIL/uL — ABNORMAL LOW (ref 3.87–5.11)
RDW: 17.1 % — ABNORMAL HIGH (ref 11.5–15.5)
WBC: 2.2 10*3/uL — ABNORMAL LOW (ref 4.0–10.5)
nRBC: 0 % (ref 0.0–0.2)

## 2020-04-12 LAB — URINALYSIS, COMPLETE (UACMP) WITH MICROSCOPIC
Bilirubin Urine: NEGATIVE
Glucose, UA: NEGATIVE mg/dL
Hgb urine dipstick: NEGATIVE
Ketones, ur: NEGATIVE mg/dL
Leukocytes,Ua: NEGATIVE
Nitrite: NEGATIVE
Protein, ur: 30 mg/dL — AB
Specific Gravity, Urine: 1.015 (ref 1.005–1.030)
pH: 5 (ref 5.0–8.0)

## 2020-04-12 LAB — SAMPLE TO BLOOD BANK

## 2020-04-12 LAB — CMP (CANCER CENTER ONLY)
ALT: 23 U/L (ref 0–44)
AST: 22 U/L (ref 15–41)
Albumin: 3.5 g/dL (ref 3.5–5.0)
Alkaline Phosphatase: 96 U/L (ref 38–126)
Anion gap: 7 (ref 5–15)
BUN: 29 mg/dL — ABNORMAL HIGH (ref 8–23)
CO2: 24 mmol/L (ref 22–32)
Calcium: 9 mg/dL (ref 8.9–10.3)
Chloride: 104 mmol/L (ref 98–111)
Creatinine: 1.11 mg/dL — ABNORMAL HIGH (ref 0.44–1.00)
GFR, Est AFR Am: 51 mL/min — ABNORMAL LOW (ref >60)
GFR, Estimated: 44 mL/min — ABNORMAL LOW (ref >60)
Glucose, Bld: 139 mg/dL — ABNORMAL HIGH (ref 70–99)
Potassium: 4.1 mmol/L (ref 3.5–5.1)
Sodium: 135 mmol/L (ref 135–145)
Total Bilirubin: 0.4 mg/dL (ref 0.3–1.2)
Total Protein: 7.6 g/dL (ref 6.5–8.1)

## 2020-04-12 LAB — FERRITIN: Ferritin: 801 ng/mL — ABNORMAL HIGH (ref 11–307)

## 2020-04-12 LAB — IRON AND TIBC
Iron: 72 ug/dL (ref 41–142)
Saturation Ratios: 29 % (ref 21–57)
TIBC: 250 ug/dL (ref 236–444)
UIBC: 178 ug/dL (ref 120–384)

## 2020-04-13 ENCOUNTER — Other Ambulatory Visit: Payer: Self-pay

## 2020-04-13 ENCOUNTER — Inpatient Hospital Stay: Payer: Medicare Other

## 2020-04-13 VITALS — BP 124/49 | HR 71 | Temp 98.2°F | Resp 17 | Wt 118.8 lb

## 2020-04-13 DIAGNOSIS — D469 Myelodysplastic syndrome, unspecified: Secondary | ICD-10-CM | POA: Diagnosis not present

## 2020-04-13 DIAGNOSIS — D649 Anemia, unspecified: Secondary | ICD-10-CM

## 2020-04-13 DIAGNOSIS — E611 Iron deficiency: Secondary | ICD-10-CM | POA: Diagnosis not present

## 2020-04-13 DIAGNOSIS — D61818 Other pancytopenia: Secondary | ICD-10-CM | POA: Diagnosis not present

## 2020-04-13 DIAGNOSIS — N329 Bladder disorder, unspecified: Secondary | ICD-10-CM | POA: Diagnosis not present

## 2020-04-13 DIAGNOSIS — E860 Dehydration: Secondary | ICD-10-CM | POA: Diagnosis not present

## 2020-04-13 DIAGNOSIS — D72829 Elevated white blood cell count, unspecified: Secondary | ICD-10-CM | POA: Diagnosis not present

## 2020-04-13 LAB — URINE CULTURE

## 2020-04-13 MED ORDER — SODIUM CHLORIDE 0.9 % IV SOLN
750.0000 mg | Freq: Once | INTRAVENOUS | Status: AC
  Administered 2020-04-13: 750 mg via INTRAVENOUS
  Filled 2020-04-13: qty 15

## 2020-04-13 MED ORDER — LORATADINE 10 MG PO TABS
10.0000 mg | ORAL_TABLET | Freq: Once | ORAL | Status: AC
  Administered 2020-04-13: 10 mg via ORAL

## 2020-04-13 MED ORDER — ACETAMINOPHEN 325 MG PO TABS
ORAL_TABLET | ORAL | Status: AC
  Filled 2020-04-13: qty 2

## 2020-04-13 MED ORDER — SODIUM CHLORIDE 0.9 % IV SOLN
Freq: Once | INTRAVENOUS | Status: AC
  Filled 2020-04-13: qty 250

## 2020-04-13 MED ORDER — LORATADINE 10 MG PO TABS
ORAL_TABLET | ORAL | Status: AC
  Filled 2020-04-13: qty 1

## 2020-04-13 MED ORDER — ACETAMINOPHEN 325 MG PO TABS
650.0000 mg | ORAL_TABLET | Freq: Once | ORAL | Status: AC
  Administered 2020-04-13: 650 mg via ORAL

## 2020-04-13 NOTE — Progress Notes (Signed)
At the completion of injectafer infusion, patient stated she felt lightheaded.  Vitals obtained, BP 108/48.  Patient received 250cc of fluids and stated she was feeling better.  Rechecked vitals, BP 124/49.  No other changes noted per patient.  Continued 30 minute observation period with no distress or discomfort.

## 2020-04-13 NOTE — Patient Instructions (Signed)

## 2020-04-14 ENCOUNTER — Telehealth: Payer: Self-pay | Admitting: *Deleted

## 2020-04-14 DIAGNOSIS — M6281 Muscle weakness (generalized): Secondary | ICD-10-CM | POA: Diagnosis not present

## 2020-04-14 DIAGNOSIS — R278 Other lack of coordination: Secondary | ICD-10-CM | POA: Diagnosis not present

## 2020-04-14 DIAGNOSIS — R262 Difficulty in walking, not elsewhere classified: Secondary | ICD-10-CM | POA: Diagnosis not present

## 2020-04-14 DIAGNOSIS — R2681 Unsteadiness on feet: Secondary | ICD-10-CM | POA: Diagnosis not present

## 2020-04-14 NOTE — Telephone Encounter (Signed)
Contacted patient's son (no answer on patient phone #) to inform that per Dr. Irene Limbo, based on lab results, second iron infusion will not be needed on 9/16 and appt will be cancelled. She still needs to come on 9/20 for her Aranesp injection. He verbalized understanding.

## 2020-04-17 DIAGNOSIS — R2681 Unsteadiness on feet: Secondary | ICD-10-CM | POA: Diagnosis not present

## 2020-04-17 DIAGNOSIS — R262 Difficulty in walking, not elsewhere classified: Secondary | ICD-10-CM | POA: Diagnosis not present

## 2020-04-17 DIAGNOSIS — R278 Other lack of coordination: Secondary | ICD-10-CM | POA: Diagnosis not present

## 2020-04-17 DIAGNOSIS — M6281 Muscle weakness (generalized): Secondary | ICD-10-CM | POA: Diagnosis not present

## 2020-04-18 DIAGNOSIS — R278 Other lack of coordination: Secondary | ICD-10-CM | POA: Diagnosis not present

## 2020-04-18 DIAGNOSIS — R2681 Unsteadiness on feet: Secondary | ICD-10-CM | POA: Diagnosis not present

## 2020-04-18 DIAGNOSIS — R262 Difficulty in walking, not elsewhere classified: Secondary | ICD-10-CM | POA: Diagnosis not present

## 2020-04-18 DIAGNOSIS — M6281 Muscle weakness (generalized): Secondary | ICD-10-CM | POA: Diagnosis not present

## 2020-04-20 ENCOUNTER — Ambulatory Visit: Payer: Medicare Other

## 2020-04-24 ENCOUNTER — Inpatient Hospital Stay: Payer: Medicare Other

## 2020-04-24 ENCOUNTER — Other Ambulatory Visit: Payer: Self-pay

## 2020-04-24 VITALS — BP 128/64 | HR 81 | Temp 97.4°F | Resp 20

## 2020-04-24 DIAGNOSIS — D469 Myelodysplastic syndrome, unspecified: Secondary | ICD-10-CM

## 2020-04-24 DIAGNOSIS — D61818 Other pancytopenia: Secondary | ICD-10-CM | POA: Diagnosis not present

## 2020-04-24 DIAGNOSIS — E860 Dehydration: Secondary | ICD-10-CM | POA: Diagnosis not present

## 2020-04-24 DIAGNOSIS — D649 Anemia, unspecified: Secondary | ICD-10-CM

## 2020-04-24 DIAGNOSIS — E611 Iron deficiency: Secondary | ICD-10-CM | POA: Diagnosis not present

## 2020-04-24 DIAGNOSIS — N329 Bladder disorder, unspecified: Secondary | ICD-10-CM | POA: Diagnosis not present

## 2020-04-24 DIAGNOSIS — D72829 Elevated white blood cell count, unspecified: Secondary | ICD-10-CM | POA: Diagnosis not present

## 2020-04-24 MED ORDER — DARBEPOETIN ALFA 150 MCG/0.3ML IJ SOSY
150.0000 ug | PREFILLED_SYRINGE | Freq: Once | INTRAMUSCULAR | Status: AC
  Administered 2020-04-24: 150 ug via SUBCUTANEOUS
  Filled 2020-04-24: qty 0.3

## 2020-04-24 MED ORDER — DARBEPOETIN ALFA 200 MCG/0.4ML IJ SOSY: 150.0000 ug | PREFILLED_SYRINGE | Freq: Once | INTRAMUSCULAR | Status: DC

## 2020-04-24 NOTE — Patient Instructions (Signed)
Darbepoetin Alfa injection What is this medicine? DARBEPOETIN ALFA (dar be POE e tin AL fa) helps your body make more red blood cells. It is used to treat anemia caused by chronic kidney failure and chemotherapy. This medicine may be used for other purposes; ask your health care provider or pharmacist if you have questions. COMMON BRAND NAME(S): Aranesp What should I tell my health care provider before I take this medicine? They need to know if you have any of these conditions:  blood clotting disorders or history of blood clots  cancer patient not on chemotherapy  cystic fibrosis  heart disease, such as angina, heart failure, or a history of a heart attack  hemoglobin level of 12 g/dL or greater  high blood pressure  low levels of folate, iron, or vitamin B12  seizures  an unusual or allergic reaction to darbepoetin, erythropoietin, albumin, hamster proteins, latex, other medicines, foods, dyes, or preservatives  pregnant or trying to get pregnant  breast-feeding How should I use this medicine? This medicine is for injection into a vein or under the skin. It is usually given by a health care professional in a hospital or clinic setting. If you get this medicine at home, you will be taught how to prepare and give this medicine. Use exactly as directed. Take your medicine at regular intervals. Do not take your medicine more often than directed. It is important that you put your used needles and syringes in a special sharps container. Do not put them in a trash can. If you do not have a sharps container, call your pharmacist or healthcare provider to get one. A special MedGuide will be given to you by the pharmacist with each prescription and refill. Be sure to read this information carefully each time. Talk to your pediatrician regarding the use of this medicine in children. While this medicine may be used in children as young as 1 month of age for selected conditions, precautions do  apply. Overdosage: If you think you have taken too much of this medicine contact a poison control center or emergency room at once. NOTE: This medicine is only for you. Do not share this medicine with others. What if I miss a dose? If you miss a dose, take it as soon as you can. If it is almost time for your next dose, take only that dose. Do not take double or extra doses. What may interact with this medicine? Do not take this medicine with any of the following medications:  epoetin alfa This list may not describe all possible interactions. Give your health care provider a list of all the medicines, herbs, non-prescription drugs, or dietary supplements you use. Also tell them if you smoke, drink alcohol, or use illegal drugs. Some items may interact with your medicine. What should I watch for while using this medicine? Your condition will be monitored carefully while you are receiving this medicine. You may need blood work done while you are taking this medicine. This medicine may cause a decrease in vitamin B6. You should make sure that you get enough vitamin B6 while you are taking this medicine. Discuss the foods you eat and the vitamins you take with your health care professional. What side effects may I notice from receiving this medicine? Side effects that you should report to your doctor or health care professional as soon as possible:  allergic reactions like skin rash, itching or hives, swelling of the face, lips, or tongue  breathing problems  changes in   vision  chest pain  confusion, trouble speaking or understanding  feeling faint or lightheaded, falls  high blood pressure  muscle aches or pains  pain, swelling, warmth in the leg  rapid weight gain  severe headaches  sudden numbness or weakness of the face, arm or leg  trouble walking, dizziness, loss of balance or coordination  seizures (convulsions)  swelling of the ankles, feet, hands  unusually weak or  tired Side effects that usually do not require medical attention (report to your doctor or health care professional if they continue or are bothersome):  diarrhea  fever, chills (flu-like symptoms)  headaches  nausea, vomiting  redness, stinging, or swelling at site where injected This list may not describe all possible side effects. Call your doctor for medical advice about side effects. You may report side effects to FDA at 1-800-FDA-1088. Where should I keep my medicine? Keep out of the reach of children. Store in a refrigerator between 2 and 8 degrees C (36 and 46 degrees F). Do not freeze. Do not shake. Throw away any unused portion if using a single-dose vial. Throw away any unused medicine after the expiration date. NOTE: This sheet is a summary. It may not cover all possible information. If you have questions about this medicine, talk to your doctor, pharmacist, or health care provider.  2020 Elsevier/Gold Standard (2017-08-06 16:44:20)  

## 2020-05-04 DIAGNOSIS — R634 Abnormal weight loss: Secondary | ICD-10-CM | POA: Diagnosis not present

## 2020-05-04 DIAGNOSIS — R531 Weakness: Secondary | ICD-10-CM | POA: Diagnosis not present

## 2020-05-04 DIAGNOSIS — Z23 Encounter for immunization: Secondary | ICD-10-CM | POA: Diagnosis not present

## 2020-05-04 DIAGNOSIS — G6281 Critical illness polyneuropathy: Secondary | ICD-10-CM | POA: Diagnosis not present

## 2020-05-04 DIAGNOSIS — D469 Myelodysplastic syndrome, unspecified: Secondary | ICD-10-CM | POA: Diagnosis not present

## 2020-05-05 DIAGNOSIS — M6281 Muscle weakness (generalized): Secondary | ICD-10-CM | POA: Diagnosis not present

## 2020-05-05 DIAGNOSIS — R262 Difficulty in walking, not elsewhere classified: Secondary | ICD-10-CM | POA: Diagnosis not present

## 2020-05-05 DIAGNOSIS — R278 Other lack of coordination: Secondary | ICD-10-CM | POA: Diagnosis not present

## 2020-05-05 DIAGNOSIS — R2681 Unsteadiness on feet: Secondary | ICD-10-CM | POA: Diagnosis not present

## 2020-05-08 DIAGNOSIS — R2681 Unsteadiness on feet: Secondary | ICD-10-CM | POA: Diagnosis not present

## 2020-05-08 DIAGNOSIS — R262 Difficulty in walking, not elsewhere classified: Secondary | ICD-10-CM | POA: Diagnosis not present

## 2020-05-08 DIAGNOSIS — M6281 Muscle weakness (generalized): Secondary | ICD-10-CM | POA: Diagnosis not present

## 2020-05-08 DIAGNOSIS — R278 Other lack of coordination: Secondary | ICD-10-CM | POA: Diagnosis not present

## 2020-05-09 DIAGNOSIS — R2681 Unsteadiness on feet: Secondary | ICD-10-CM | POA: Diagnosis not present

## 2020-05-09 DIAGNOSIS — M6281 Muscle weakness (generalized): Secondary | ICD-10-CM | POA: Diagnosis not present

## 2020-05-09 DIAGNOSIS — R262 Difficulty in walking, not elsewhere classified: Secondary | ICD-10-CM | POA: Diagnosis not present

## 2020-05-09 DIAGNOSIS — R278 Other lack of coordination: Secondary | ICD-10-CM | POA: Diagnosis not present

## 2020-05-11 DIAGNOSIS — M6281 Muscle weakness (generalized): Secondary | ICD-10-CM | POA: Diagnosis not present

## 2020-05-11 DIAGNOSIS — R278 Other lack of coordination: Secondary | ICD-10-CM | POA: Diagnosis not present

## 2020-05-11 DIAGNOSIS — R2681 Unsteadiness on feet: Secondary | ICD-10-CM | POA: Diagnosis not present

## 2020-05-11 DIAGNOSIS — R262 Difficulty in walking, not elsewhere classified: Secondary | ICD-10-CM | POA: Diagnosis not present

## 2020-05-15 DIAGNOSIS — D414 Neoplasm of uncertain behavior of bladder: Secondary | ICD-10-CM | POA: Diagnosis not present

## 2020-05-16 ENCOUNTER — Other Ambulatory Visit: Payer: Self-pay | Admitting: *Deleted

## 2020-05-16 DIAGNOSIS — D649 Anemia, unspecified: Secondary | ICD-10-CM

## 2020-05-16 DIAGNOSIS — D469 Myelodysplastic syndrome, unspecified: Secondary | ICD-10-CM

## 2020-05-16 DIAGNOSIS — D61818 Other pancytopenia: Secondary | ICD-10-CM

## 2020-05-16 NOTE — Telephone Encounter (Signed)
Opened by accident. Please disregard.  °

## 2020-05-17 ENCOUNTER — Inpatient Hospital Stay (HOSPITAL_BASED_OUTPATIENT_CLINIC_OR_DEPARTMENT_OTHER): Payer: Medicare Other | Admitting: Hematology

## 2020-05-17 ENCOUNTER — Other Ambulatory Visit: Payer: Self-pay

## 2020-05-17 ENCOUNTER — Inpatient Hospital Stay: Payer: Medicare Other

## 2020-05-17 ENCOUNTER — Inpatient Hospital Stay: Payer: Medicare Other | Attending: Hematology

## 2020-05-17 ENCOUNTER — Ambulatory Visit: Payer: Medicare Other

## 2020-05-17 VITALS — BP 133/56 | HR 85 | Temp 97.9°F | Resp 18 | Ht 68.0 in | Wt 118.8 lb

## 2020-05-17 DIAGNOSIS — D469 Myelodysplastic syndrome, unspecified: Secondary | ICD-10-CM

## 2020-05-17 DIAGNOSIS — D61818 Other pancytopenia: Secondary | ICD-10-CM

## 2020-05-17 DIAGNOSIS — D649 Anemia, unspecified: Secondary | ICD-10-CM

## 2020-05-17 LAB — CMP (CANCER CENTER ONLY)
ALT: 23 U/L (ref 0–44)
AST: 23 U/L (ref 15–41)
Albumin: 3.7 g/dL (ref 3.5–5.0)
Alkaline Phosphatase: 94 U/L (ref 38–126)
Anion gap: 2 — ABNORMAL LOW (ref 5–15)
BUN: 18 mg/dL (ref 8–23)
CO2: 29 mmol/L (ref 22–32)
Calcium: 9 mg/dL (ref 8.9–10.3)
Chloride: 105 mmol/L (ref 98–111)
Creatinine: 1.14 mg/dL — ABNORMAL HIGH (ref 0.44–1.00)
GFR, Estimated: 43 mL/min — ABNORMAL LOW (ref >60)
Glucose, Bld: 75 mg/dL (ref 70–99)
Potassium: 4.1 mmol/L (ref 3.5–5.1)
Sodium: 136 mmol/L (ref 135–145)
Total Bilirubin: 0.5 mg/dL (ref 0.3–1.2)
Total Protein: 7.4 g/dL (ref 6.5–8.1)

## 2020-05-17 LAB — CBC WITH DIFFERENTIAL (CANCER CENTER ONLY)
Abs Immature Granulocytes: 0.03 10*3/uL (ref 0.00–0.07)
Basophils Absolute: 0 10*3/uL (ref 0.0–0.1)
Basophils Relative: 0 %
Eosinophils Absolute: 0 10*3/uL (ref 0.0–0.5)
Eosinophils Relative: 0 %
HCT: 29.7 % — ABNORMAL LOW (ref 36.0–46.0)
Hemoglobin: 9.4 g/dL — ABNORMAL LOW (ref 12.0–15.0)
Immature Granulocytes: 1 %
Lymphocytes Relative: 33 %
Lymphs Abs: 0.9 10*3/uL (ref 0.7–4.0)
MCH: 33.5 pg (ref 26.0–34.0)
MCHC: 31.6 g/dL (ref 30.0–36.0)
MCV: 105.7 fL — ABNORMAL HIGH (ref 80.0–100.0)
Monocytes Absolute: 1.2 10*3/uL — ABNORMAL HIGH (ref 0.1–1.0)
Monocytes Relative: 44 %
Neutro Abs: 0.6 10*3/uL — ABNORMAL LOW (ref 1.7–7.7)
Neutrophils Relative %: 22 %
Platelet Count: 60 10*3/uL — ABNORMAL LOW (ref 150–400)
RBC: 2.81 MIL/uL — ABNORMAL LOW (ref 3.87–5.11)
RDW: 19 % — ABNORMAL HIGH (ref 11.5–15.5)
WBC Count: 2.7 10*3/uL — ABNORMAL LOW (ref 4.0–10.5)
nRBC: 0 % (ref 0.0–0.2)

## 2020-05-17 LAB — IRON AND TIBC
Iron: 68 ug/dL (ref 41–142)
Saturation Ratios: 31 % (ref 21–57)
TIBC: 221 ug/dL — ABNORMAL LOW (ref 236–444)
UIBC: 153 ug/dL (ref 120–384)

## 2020-05-17 LAB — FERRITIN: Ferritin: 1799 ng/mL — ABNORMAL HIGH (ref 11–307)

## 2020-05-17 MED ORDER — DARBEPOETIN ALFA 150 MCG/0.3ML IJ SOSY
150.0000 ug | PREFILLED_SYRINGE | Freq: Once | INTRAMUSCULAR | Status: AC
  Administered 2020-05-17: 150 ug via SUBCUTANEOUS
  Filled 2020-05-17: qty 0.3

## 2020-05-17 NOTE — Progress Notes (Signed)
HEMATOLOGY/ONCOLOGY CLINIC NOTE  Date of Service: 05/17/2020  Patient Care Team: Burnard Bunting, MD as PCP - General (Internal Medicine)  REFERRING PHYSICIAN: Burnard Bunting, MD  CHIEF COMPLAINTS/PURPOSE OF CONSULTATION:  Pancytopenia  HISTORY OF PRESENTING ILLNESS:  Alison Bridges is a wonderful 86 y.o. female who has been referred to [place] by Burnard Bunting, MD for evaluation and management of Pancytopenia. The pt reports that she is doing well overall.   The pt reports she is good. Pt lives alone. Prior to the pandemic she was taking a workout class. About the last 3 months pt has had fatigue, loss of appetite, and weight loss. Last October pt had fatigue, SOB, and was spitting up blood. She has acid reflux and had esophagus dilated twice. A week ago pt was in the hospital due to infection in her right ankle. Her ankle started swelling and turning red. The ankle was not painful but did have burning and tightness. She was put on antibiotics and iron.  Pt uses partial dentures and does not use denture cream. She has been having a burning and hot feeling in feet and face that comes and goes. Her thyroid medication has been stable and she takes the pills several hours before eating with just water. Pt has loss about 10 pounds due to not having appetite. Recently she has had spontaneous bruising. She has had both doses of COVID19 vaccine. Pt had been taking 50mg  of zync for 4-6weeks prior to hospitalization.   Of note prior to the patient's visit today, pt has had DG Ankle Complete Right ([id number]) completed on 12/08/19 with results revealing "Soft tissue swelling without acute osseous finding." Pt has had Lower Venous DVT Study completed on 12/09/19 with results revealing "RIGHT: There is no evidence of deep vein thrombosis in the lower extremity. No cystic structure found in the popliteal fossa. LEFT: No evidence of common femoral vein obstruction." Pt has had CT Abdomen Pelvis W IV  Contrast completed on 10/07/19 with results revealing, " 1. No acute finding 2. Areas of enhancement in the right bladder worrisome for neoplasm 3. Cholecystectomy 4. Bilateral renal cortical cysts and nonobstructive left renal calculi. 5. Aortoiliac atherosclerosis without aneurysm. 6. Hysterectomy 7. Diverticulosis without diverticulitis. 8. Lumbar DDD and DJD with degenerative anterolisthesis of L5 on S1"  Most recent lab results (12/10/19) of CBC is as follows: all values are WNL except for WBC at 2.9K, RBC at 2.35, Hemoglobin at 8.2, HCT at 25.1, MCV at 106.8, MCH at 34.9, Platelets at 66K, Neutro Abs at 0.6K, Monocytes Absolute at 1.4K, Glucose at 105, Calcium at 8.4, GFR, Calc Non Af Amer at 54 12/08/19 of Lactic Acid, Venous at 1.3 12/08/19 of CRP at 0.8: WNL 12/08/19 of Reticulocytes is as follows: all values are WNL except for RC at 2.52, Immature Retic Fract at 16.9 12/08/19 of Ferritin at 167: WNL 12/08/19 of Iron and TIBC is as follows: all values are WNL except for Iron at 27, Saturation Ratios at 10 12/08/19 of Folate at 52.3: WNL 12/08/19 of Vitamin B12 at 931 12/08/19 of Sed Rate at 60  On review of systems, pt reports fatigue, lack of appetite, burning/hot sensation in face and feet, weight loss  and denies lumps/bumps, skin rashes, bone pain, fever, chills, night sweats, changes in bowl habits and urinary habits, nose bleeds, gum bleeds, blood in urine/stool, lightheaded, dizzy, changes in breathing, back pain, constipation and any other symptoms.   INTERVAL HISTORY:  Alison Bridges is a wonderful  86 y.o. female who is here for evaluation and management of Pancytopenia - likely from MDS +/- MPN. The patient's last visit with [place] was on 04/12/2020. The pt reports that she is doing well overall.  The pt reports that she is eating well and has more energy than she did at our last visit. Pt is receiving physical and orthopedic therapy at her new retirement community. Pt denies any  current urinary symptoms and has been seen by Dr. Gilford Rile who completed a cystoscopy. She continues taking a daily B-complex vitamin.  Lab results today (05/17/20) of CBC w/diff and CMP is as follows: all values are WNL except for WBC at 2.7K, RBC at 2.81, Hgb at 9.4, HCT at 29.7, MCV at 105.7, RDW at 19.0, PLT at 60K, Neutro Abs at 0.6, Mono Abs at 1.2K, Creatinine at 1.14, GFR Est at 43, Anion gap at 2. 05/17/2020 Iron Panel is as follows: Iron at 68, TIBC at 221, Sat Ratios at 31, UIBC at 153. 05/17/2020 Ferritin at 1799  On review of systems, pt reports discolored skin on lower extremity, constipation, bruising and denies fevers, chills, abnormal/excessive bleeding, dysuria, low appetite, new/worsening fatigue and any other symptoms.   MEDICAL HISTORY:  Past Medical History:  Diagnosis Date  . Depression   . Hypothyroidism   . Osteoporosis    SURGICAL HISTORY: Past Surgical History:  Procedure Laterality Date  . ABDOMINAL HYSTERECTOMY    . APPENDECTOMY  1998  . CHOLECYSTECTOMY  1998   SOCIAL HISTORY: Social History   Socioeconomic History  . Marital status: Widowed    Spouse name: Not on file  . Number of children: Not on file  . Years of education: Not on file  . Highest education level: Not on file  Occupational History  . Occupation: Oncologist    Comment: Retired  Tobacco Use  . Smoking status: Never Smoker  . Smokeless tobacco: Never Used  Substance and Sexual Activity  . Alcohol use: No  . Drug use: No  . Sexual activity: Not on file  Other Topics Concern  . Not on file  Social History Narrative   She is tried to stay active --  2 days a week she does senior aerobics.  She also enjoys going for walks, but no standard routine.      She has 3 children and 2 grandchildren.   Social Determinants of Health   Financial Resource Strain:   . Difficulty of Paying Living Expenses: Not on file  Food Insecurity: No Food Insecurity  . Worried About Paediatric nurse in the Last Year: Never true  . Ran Out of Food in the Last Year: Never true  Transportation Needs: No Transportation Needs  . Lack of Transportation (Medical): No  . Lack of Transportation (Non-Medical): No  Physical Activity:   . Days of Exercise per Week: Not on file  . Minutes of Exercise per Session: Not on file  Stress:   . Feeling of Stress : Not on file  Social Connections:   . Frequency of Communication with Friends and Family: Not on file  . Frequency of Social Gatherings with Friends and Family: Not on file  . Attends Religious Services: Not on file  . Active Member of Clubs or Organizations: Not on file  . Attends Archivist Meetings: Not on file  . Marital Status: Not on file  Intimate Partner Violence:   . Fear of Current or Ex-Partner: Not on file  . Emotionally Abused:  Not on file  . Physically Abused: Not on file  . Sexually Abused: Not on file     FAMILY HISTORY: Family History  Problem Relation Age of Onset  . Heart attack Mother 59  . Pancreatic cancer Father   . Parkinson's disease Sister   . Diabetes Brother        Multiple complications  . Heart disease Brother        Not sure what happened  . Dementia Brother      ALLERGIES:   has No Known Allergies.   MEDICATIONS:  Current Outpatient Medications  Medication Sig Dispense Refill  . cholecalciferol (VITAMIN D) 1000 UNITS tablet Take 1,000 Units by mouth daily.    . ferrous sulfate 325 (65 FE) MG tablet Take 1 tablet (325 mg total) by mouth daily. (Patient not taking: Reported on 03/22/2020) 30 tablet 1  . levothyroxine (SYNTHROID, LEVOTHROID) 125 MCG tablet Take 125 mcg by mouth daily before breakfast.    . methocarbamol (ROBAXIN) 750 MG tablet Take 1 tablet (750 mg total) by mouth every 6 (six) hours as needed for muscle spasms. 20 tablet 0  . Multiple Vitamin (MULTIVITAMIN WITH MINERALS) TABS tablet Take 1 tablet by mouth daily.    . pantoprazole (PROTONIX) 40 MG tablet  Take 40 mg by mouth daily as needed (acid reflux).     . polyethylene glycol (MIRALAX) 17 g packet Take 17 g by mouth daily as needed for mild constipation.    . sertraline (ZOLOFT) 100 MG tablet Take 100 mg by mouth daily.     No current facility-administered medications for this visit.     REVIEW OF SYSTEMS:   A 10+ POINT REVIEW OF SYSTEMS WAS OBTAINED including neurology, dermatology, psychiatry, cardiac, respiratory, lymph, extremities, GI, GU, Musculoskeletal, constitutional, breasts, reproductive, HEENT.  All pertinent positives are noted in the HPI.  All others are negative.   PHYSICAL EXAMINATION: ECOG PERFORMANCE STATUS: 2 - Symptomatic, <50% confined to bed  There were no vitals filed for this visit. There were no vitals filed for this visit. There is no height or weight on file to calculate BMI.  NAD  GENERAL:alert, in no acute distress and comfortable SKIN: no acute rashes, no significant lesions EYES: conjunctiva are pink and non-injected, sclera anicteric OROPHARYNX: MMM, no exudates, no oropharyngeal erythema or ulceration NECK: supple, no JVD LYMPH:  no palpable lymphadenopathy in the cervical, axillary or inguinal regions LUNGS: clear to auscultation b/l with normal respiratory effort HEART: regular rate & rhythm ABDOMEN:  normoactive bowel sounds , non tender, not distended. No palpable hepatosplenomegaly.  Extremity: no pedal edema PSYCH: alert & oriented x 3 with fluent speech NEURO: no focal motor/sensory deficits  LABORATORY DATA:  I have reviewed the data as listed  CBC Latest Ref Rng & Units 04/12/2020 03/24/2020 03/23/2020  WBC 4.0 - 10.5 K/uL 2.2(L) 5.3 7.8  Hemoglobin 12.0 - 15.0 g/dL 9.1(L) 7.7(L) 8.3(L)  Hematocrit 36 - 46 % 28.1(L) 24.1(L) 25.3(L)  Platelets 150 - 400 K/uL 94(L) 44(L) 37(L)    CMP Latest Ref Rng & Units 04/12/2020 03/25/2020 03/24/2020  Glucose 70 - 99 mg/dL 139(H) 98 114(H)  BUN 8 - 23 mg/dL 29(H) 22 38(H)  Creatinine 0.44 - 1.00  mg/dL 1.11(H) 1.05(H) 1.30(H)  Sodium 135 - 145 mmol/L 135 138 135  Potassium 3.5 - 5.1 mmol/L 4.1 4.2 3.4(L)  Chloride 98 - 111 mmol/L 104 112(H) 109  CO2 22 - 32 mmol/L 24 19(L) 17(L)  Calcium 8.9 - 10.3 mg/dL  9.0 7.7(L) 8.1(L)  Total Protein 6.5 - 8.1 g/dL 7.6 5.5(L) -  Total Bilirubin 0.3 - 1.2 mg/dL 0.4 0.3 -  Alkaline Phos 38 - 126 U/L 96 142(H) -  AST 15 - 41 U/L 22 32 -  ALT 0 - 44 U/L 23 54(H) -    01/10/2020 Cytogenetics [phone] [phone]):    01/10/2020 FISH Panel ([id number]):   01/10/2020 BM Bx Report ([id number]):    12/16/2019 JAK2, MPL, CALR Panel Report:    RADIOGRAPHIC STUDIES: I have personally reviewed the radiological images as listed and agreed with the findings in the report. No results found.   ASSESSMENT & PLAN:  BRAYLIE BADAMI is a 86 y.o. female with:   1. Pancytopenia - likely from MDS +/- MPN -01/10/2020 BM Bx Surgical Pathology Report ([id number]) revealed "BONE MARROW, ASPIRATE, CLOT, CORE: -Hypercellular marrow with myeloid hyperplasia and megakaryocytic atypia  PERIPHERAL BLOOD: - Pancytopenia". -12/16/19 JAK2 (including V617 and Exon 12), MPL, and CALR-Next Generation Sequencing shows "Tier II: Variants of Potential Clinical Significance - SRSF2 p.Pro95His" 2. Bladder lesion -- patient following with urology PLAN: -Discussed pt labwork today, 05/17/20; improved anemia & leukopenia, PLT continue to fluctuate, blood chemistries are steady.  -Discussed 05/17/2020 Iron Panel is as follows: Iron at 68, TIBC at 221, Sat Ratios at 31, UIBC at 153. -Discussed 05/17/2020 Ferritin at 1799 -Advised pt that we will continue monthly Aranesp at the lowest possible dose for goal Hgb ? 10. -Advised pt that her skin discoloration is caused by venous stasis dermatitis. Recommend pt wear compression socks and elevate legs. -Advised pt that we do not typically see spontaneous bruising when PLT ? 60K -Recommend pt use OTC stool softener to improve  constipation. -Continue daily B-complex vitamin -Will see back in 3 months   FOLLOW UP: Plz schedule next 6 doses of monthly Aranesp injections with labs MD visit in 3 months    The total time spent in the appt was 20 minutes and more than 50% was on counseling and direct patient cares.  All of the patient's questions were answered with apparent satisfaction. The patient knows to call the clinic with any problems, questions or concerns.   Sullivan Lone MD [place] AAHIVMS [facility] Hematology/Oncology Physician [facility]  (Office):       [phone] (Work cell):  [phone] (Fax):           [phone]  05/17/2020 7:56 AM  I, Yevette Edwards, am acting as a scribe for Dr. Sullivan Lone.   .I have reviewed the above documentation for accuracy and completeness, and I agree with the above. [name] MD

## 2020-05-17 NOTE — Patient Instructions (Signed)
Darbepoetin Alfa injection What is this medicine? DARBEPOETIN ALFA (dar be POE e tin AL fa) helps your body make more red blood cells. It is used to treat anemia caused by chronic kidney failure and chemotherapy. This medicine may be used for other purposes; ask your health care provider or pharmacist if you have questions. COMMON BRAND NAME(S): Aranesp What should I tell my health care provider before I take this medicine? They need to know if you have any of these conditions:  blood clotting disorders or history of blood clots  cancer patient not on chemotherapy  cystic fibrosis  heart disease, such as angina, heart failure, or a history of a heart attack  hemoglobin level of 12 g/dL or greater  high blood pressure  low levels of folate, iron, or vitamin B12  seizures  an unusual or allergic reaction to darbepoetin, erythropoietin, albumin, hamster proteins, latex, other medicines, foods, dyes, or preservatives  pregnant or trying to get pregnant  breast-feeding How should I use this medicine? This medicine is for injection into a vein or under the skin. It is usually given by a health care professional in a hospital or clinic setting. If you get this medicine at home, you will be taught how to prepare and give this medicine. Use exactly as directed. Take your medicine at regular intervals. Do not take your medicine more often than directed. It is important that you put your used needles and syringes in a special sharps container. Do not put them in a trash can. If you do not have a sharps container, call your pharmacist or healthcare provider to get one. A special MedGuide will be given to you by the pharmacist with each prescription and refill. Be sure to read this information carefully each time. Talk to your pediatrician regarding the use of this medicine in children. While this medicine may be used in children as young as 1 month of age for selected conditions, precautions do  apply. Overdosage: If you think you have taken too much of this medicine contact a poison control center or emergency room at once. NOTE: This medicine is only for you. Do not share this medicine with others. What if I miss a dose? If you miss a dose, take it as soon as you can. If it is almost time for your next dose, take only that dose. Do not take double or extra doses. What may interact with this medicine? Do not take this medicine with any of the following medications:  epoetin alfa This list may not describe all possible interactions. Give your health care provider a list of all the medicines, herbs, non-prescription drugs, or dietary supplements you use. Also tell them if you smoke, drink alcohol, or use illegal drugs. Some items may interact with your medicine. What should I watch for while using this medicine? Your condition will be monitored carefully while you are receiving this medicine. You may need blood work done while you are taking this medicine. This medicine may cause a decrease in vitamin B6. You should make sure that you get enough vitamin B6 while you are taking this medicine. Discuss the foods you eat and the vitamins you take with your health care professional. What side effects may I notice from receiving this medicine? Side effects that you should report to your doctor or health care professional as soon as possible:  allergic reactions like skin rash, itching or hives, swelling of the face, lips, or tongue  breathing problems  changes in   vision  chest pain  confusion, trouble speaking or understanding  feeling faint or lightheaded, falls  high blood pressure  muscle aches or pains  pain, swelling, warmth in the leg  rapid weight gain  severe headaches  sudden numbness or weakness of the face, arm or leg  trouble walking, dizziness, loss of balance or coordination  seizures (convulsions)  swelling of the ankles, feet, hands  unusually weak or  tired Side effects that usually do not require medical attention (report to your doctor or health care professional if they continue or are bothersome):  diarrhea  fever, chills (flu-like symptoms)  headaches  nausea, vomiting  redness, stinging, or swelling at site where injected This list may not describe all possible side effects. Call your doctor for medical advice about side effects. You may report side effects to FDA at 1-800-FDA-1088. Where should I keep my medicine? Keep out of the reach of children. Store in a refrigerator between 2 and 8 degrees C (36 and 46 degrees F). Do not freeze. Do not shake. Throw away any unused portion if using a single-dose vial. Throw away any unused medicine after the expiration date. NOTE: This sheet is a summary. It may not cover all possible information. If you have questions about this medicine, talk to your doctor, pharmacist, or health care provider.  2020 Elsevier/Gold Standard (2017-08-06 16:44:20)  

## 2020-05-18 DIAGNOSIS — M6281 Muscle weakness (generalized): Secondary | ICD-10-CM | POA: Diagnosis not present

## 2020-05-18 DIAGNOSIS — R278 Other lack of coordination: Secondary | ICD-10-CM | POA: Diagnosis not present

## 2020-05-18 DIAGNOSIS — R262 Difficulty in walking, not elsewhere classified: Secondary | ICD-10-CM | POA: Diagnosis not present

## 2020-05-18 DIAGNOSIS — R2681 Unsteadiness on feet: Secondary | ICD-10-CM | POA: Diagnosis not present

## 2020-05-24 DIAGNOSIS — R2681 Unsteadiness on feet: Secondary | ICD-10-CM | POA: Diagnosis not present

## 2020-05-24 DIAGNOSIS — R262 Difficulty in walking, not elsewhere classified: Secondary | ICD-10-CM | POA: Diagnosis not present

## 2020-05-24 DIAGNOSIS — R278 Other lack of coordination: Secondary | ICD-10-CM | POA: Diagnosis not present

## 2020-05-24 DIAGNOSIS — M6281 Muscle weakness (generalized): Secondary | ICD-10-CM | POA: Diagnosis not present

## 2020-05-25 DIAGNOSIS — R2681 Unsteadiness on feet: Secondary | ICD-10-CM | POA: Diagnosis not present

## 2020-05-25 DIAGNOSIS — R278 Other lack of coordination: Secondary | ICD-10-CM | POA: Diagnosis not present

## 2020-05-25 DIAGNOSIS — R262 Difficulty in walking, not elsewhere classified: Secondary | ICD-10-CM | POA: Diagnosis not present

## 2020-05-25 DIAGNOSIS — M6281 Muscle weakness (generalized): Secondary | ICD-10-CM | POA: Diagnosis not present

## 2020-05-30 DIAGNOSIS — E039 Hypothyroidism, unspecified: Secondary | ICD-10-CM | POA: Diagnosis not present

## 2020-06-06 DIAGNOSIS — M6281 Muscle weakness (generalized): Secondary | ICD-10-CM | POA: Diagnosis not present

## 2020-06-06 DIAGNOSIS — R2681 Unsteadiness on feet: Secondary | ICD-10-CM | POA: Diagnosis not present

## 2020-06-06 DIAGNOSIS — R262 Difficulty in walking, not elsewhere classified: Secondary | ICD-10-CM | POA: Diagnosis not present

## 2020-06-13 ENCOUNTER — Other Ambulatory Visit: Payer: Self-pay

## 2020-06-13 DIAGNOSIS — D469 Myelodysplastic syndrome, unspecified: Secondary | ICD-10-CM

## 2020-06-14 ENCOUNTER — Other Ambulatory Visit: Payer: Self-pay

## 2020-06-14 ENCOUNTER — Inpatient Hospital Stay: Payer: Medicare Other | Attending: Hematology

## 2020-06-14 ENCOUNTER — Inpatient Hospital Stay: Payer: Medicare Other

## 2020-06-14 VITALS — BP 132/65 | HR 78 | Temp 98.2°F | Resp 18

## 2020-06-14 DIAGNOSIS — D649 Anemia, unspecified: Secondary | ICD-10-CM

## 2020-06-14 DIAGNOSIS — D469 Myelodysplastic syndrome, unspecified: Secondary | ICD-10-CM

## 2020-06-14 LAB — CBC WITH DIFFERENTIAL (CANCER CENTER ONLY)
Abs Immature Granulocytes: 0.02 10*3/uL (ref 0.00–0.07)
Basophils Absolute: 0 10*3/uL (ref 0.0–0.1)
Basophils Relative: 0 %
Eosinophils Absolute: 0 10*3/uL (ref 0.0–0.5)
Eosinophils Relative: 0 %
HCT: 28.4 % — ABNORMAL LOW (ref 36.0–46.0)
Hemoglobin: 9.3 g/dL — ABNORMAL LOW (ref 12.0–15.0)
Immature Granulocytes: 1 %
Lymphocytes Relative: 42 %
Lymphs Abs: 1.1 10*3/uL (ref 0.7–4.0)
MCH: 35 pg — ABNORMAL HIGH (ref 26.0–34.0)
MCHC: 32.7 g/dL (ref 30.0–36.0)
MCV: 106.8 fL — ABNORMAL HIGH (ref 80.0–100.0)
Monocytes Absolute: 1.1 10*3/uL — ABNORMAL HIGH (ref 0.1–1.0)
Monocytes Relative: 39 %
Neutro Abs: 0.5 10*3/uL — ABNORMAL LOW (ref 1.7–7.7)
Neutrophils Relative %: 18 %
Platelet Count: 68 10*3/uL — ABNORMAL LOW (ref 150–400)
RBC: 2.66 MIL/uL — ABNORMAL LOW (ref 3.87–5.11)
RDW: 16 % — ABNORMAL HIGH (ref 11.5–15.5)
WBC Count: 2.7 10*3/uL — ABNORMAL LOW (ref 4.0–10.5)
nRBC: 0 % (ref 0.0–0.2)

## 2020-06-14 LAB — FERRITIN: Ferritin: 1778 ng/mL — ABNORMAL HIGH (ref 11–307)

## 2020-06-14 LAB — IRON AND TIBC
Iron: 109 ug/dL (ref 41–142)
Saturation Ratios: 49 % (ref 21–57)
TIBC: 225 ug/dL — ABNORMAL LOW (ref 236–444)
UIBC: 116 ug/dL — ABNORMAL LOW (ref 120–384)

## 2020-06-14 LAB — CMP (CANCER CENTER ONLY)
ALT: 22 U/L (ref 0–44)
AST: 22 U/L (ref 15–41)
Albumin: 3.9 g/dL (ref 3.5–5.0)
Alkaline Phosphatase: 101 U/L (ref 38–126)
Anion gap: 8 (ref 5–15)
BUN: 24 mg/dL — ABNORMAL HIGH (ref 8–23)
CO2: 25 mmol/L (ref 22–32)
Calcium: 8.8 mg/dL — ABNORMAL LOW (ref 8.9–10.3)
Chloride: 105 mmol/L (ref 98–111)
Creatinine: 1.19 mg/dL — ABNORMAL HIGH (ref 0.44–1.00)
GFR, Estimated: 44 mL/min — ABNORMAL LOW (ref >60)
Glucose, Bld: 83 mg/dL (ref 70–99)
Potassium: 4.2 mmol/L (ref 3.5–5.1)
Sodium: 138 mmol/L (ref 135–145)
Total Bilirubin: 0.6 mg/dL (ref 0.3–1.2)
Total Protein: 7.4 g/dL (ref 6.5–8.1)

## 2020-06-14 MED ORDER — DARBEPOETIN ALFA 150 MCG/0.3ML IJ SOSY
150.0000 ug | PREFILLED_SYRINGE | Freq: Once | INTRAMUSCULAR | Status: AC
  Administered 2020-06-14: 150 ug via SUBCUTANEOUS
  Filled 2020-06-14: qty 0.3

## 2020-06-14 NOTE — Patient Instructions (Signed)
Darbepoetin Alfa injection What is this medicine? DARBEPOETIN ALFA (dar be POE e tin AL fa) helps your body make more red blood cells. It is used to treat anemia caused by chronic kidney failure and chemotherapy. This medicine may be used for other purposes; ask your health care provider or pharmacist if you have questions. COMMON BRAND NAME(S): Aranesp What should I tell my health care provider before I take this medicine? They need to know if you have any of these conditions:  blood clotting disorders or history of blood clots  cancer patient not on chemotherapy  cystic fibrosis  heart disease, such as angina, heart failure, or a history of a heart attack  hemoglobin level of 12 g/dL or greater  high blood pressure  low levels of folate, iron, or vitamin B12  seizures  an unusual or allergic reaction to darbepoetin, erythropoietin, albumin, hamster proteins, latex, other medicines, foods, dyes, or preservatives  pregnant or trying to get pregnant  breast-feeding How should I use this medicine? This medicine is for injection into a vein or under the skin. It is usually given by a health care professional in a hospital or clinic setting. If you get this medicine at home, you will be taught how to prepare and give this medicine. Use exactly as directed. Take your medicine at regular intervals. Do not take your medicine more often than directed. It is important that you put your used needles and syringes in a special sharps container. Do not put them in a trash can. If you do not have a sharps container, call your pharmacist or healthcare provider to get one. A special MedGuide will be given to you by the pharmacist with each prescription and refill. Be sure to read this information carefully each time. Talk to your pediatrician regarding the use of this medicine in children. While this medicine may be used in children as young as 1 month of age for selected conditions, precautions do  apply. Overdosage: If you think you have taken too much of this medicine contact a poison control center or emergency room at once. NOTE: This medicine is only for you. Do not share this medicine with others. What if I miss a dose? If you miss a dose, take it as soon as you can. If it is almost time for your next dose, take only that dose. Do not take double or extra doses. What may interact with this medicine? Do not take this medicine with any of the following medications:  epoetin alfa This list may not describe all possible interactions. Give your health care provider a list of all the medicines, herbs, non-prescription drugs, or dietary supplements you use. Also tell them if you smoke, drink alcohol, or use illegal drugs. Some items may interact with your medicine. What should I watch for while using this medicine? Your condition will be monitored carefully while you are receiving this medicine. You may need blood work done while you are taking this medicine. This medicine may cause a decrease in vitamin B6. You should make sure that you get enough vitamin B6 while you are taking this medicine. Discuss the foods you eat and the vitamins you take with your health care professional. What side effects may I notice from receiving this medicine? Side effects that you should report to your doctor or health care professional as soon as possible:  allergic reactions like skin rash, itching or hives, swelling of the face, lips, or tongue  breathing problems  changes in   vision  chest pain  confusion, trouble speaking or understanding  feeling faint or lightheaded, falls  high blood pressure  muscle aches or pains  pain, swelling, warmth in the leg  rapid weight gain  severe headaches  sudden numbness or weakness of the face, arm or leg  trouble walking, dizziness, loss of balance or coordination  seizures (convulsions)  swelling of the ankles, feet, hands  unusually weak or  tired Side effects that usually do not require medical attention (report to your doctor or health care professional if they continue or are bothersome):  diarrhea  fever, chills (flu-like symptoms)  headaches  nausea, vomiting  redness, stinging, or swelling at site where injected This list may not describe all possible side effects. Call your doctor for medical advice about side effects. You may report side effects to FDA at 1-800-FDA-1088. Where should I keep my medicine? Keep out of the reach of children. Store in a refrigerator between 2 and 8 degrees C (36 and 46 degrees F). Do not freeze. Do not shake. Throw away any unused portion if using a single-dose vial. Throw away any unused medicine after the expiration date. NOTE: This sheet is a summary. It may not cover all possible information. If you have questions about this medicine, talk to your doctor, pharmacist, or health care provider.  2020 Elsevier/Gold Standard (2017-08-06 16:44:20)  

## 2020-07-11 ENCOUNTER — Other Ambulatory Visit: Payer: Self-pay | Admitting: *Deleted

## 2020-07-11 DIAGNOSIS — D469 Myelodysplastic syndrome, unspecified: Secondary | ICD-10-CM

## 2020-07-12 ENCOUNTER — Inpatient Hospital Stay: Payer: Medicare Other

## 2020-07-12 ENCOUNTER — Inpatient Hospital Stay: Payer: Medicare Other | Attending: Hematology

## 2020-07-12 ENCOUNTER — Other Ambulatory Visit: Payer: Self-pay

## 2020-07-12 VITALS — BP 129/54 | HR 76 | Temp 98.0°F | Resp 16

## 2020-07-12 DIAGNOSIS — D649 Anemia, unspecified: Secondary | ICD-10-CM

## 2020-07-12 DIAGNOSIS — D469 Myelodysplastic syndrome, unspecified: Secondary | ICD-10-CM | POA: Insufficient documentation

## 2020-07-12 DIAGNOSIS — Z23 Encounter for immunization: Secondary | ICD-10-CM | POA: Diagnosis not present

## 2020-07-12 DIAGNOSIS — Z79899 Other long term (current) drug therapy: Secondary | ICD-10-CM | POA: Insufficient documentation

## 2020-07-12 LAB — IRON AND TIBC
Iron: 86 ug/dL (ref 41–142)
Saturation Ratios: 38 % (ref 21–57)
TIBC: 228 ug/dL — ABNORMAL LOW (ref 236–444)
UIBC: 142 ug/dL (ref 120–384)

## 2020-07-12 LAB — CBC WITH DIFFERENTIAL (CANCER CENTER ONLY)
Abs Immature Granulocytes: 0.03 10*3/uL (ref 0.00–0.07)
Basophils Absolute: 0 10*3/uL (ref 0.0–0.1)
Basophils Relative: 0 %
Eosinophils Absolute: 0 10*3/uL (ref 0.0–0.5)
Eosinophils Relative: 0 %
HCT: 30.2 % — ABNORMAL LOW (ref 36.0–46.0)
Hemoglobin: 9.6 g/dL — ABNORMAL LOW (ref 12.0–15.0)
Immature Granulocytes: 1 %
Lymphocytes Relative: 37 %
Lymphs Abs: 1.1 10*3/uL (ref 0.7–4.0)
MCH: 34.4 pg — ABNORMAL HIGH (ref 26.0–34.0)
MCHC: 31.8 g/dL (ref 30.0–36.0)
MCV: 108.2 fL — ABNORMAL HIGH (ref 80.0–100.0)
Monocytes Absolute: 1.2 10*3/uL — ABNORMAL HIGH (ref 0.1–1.0)
Monocytes Relative: 39 %
Neutro Abs: 0.7 10*3/uL — ABNORMAL LOW (ref 1.7–7.7)
Neutrophils Relative %: 23 %
Platelet Count: 64 10*3/uL — ABNORMAL LOW (ref 150–400)
RBC: 2.79 MIL/uL — ABNORMAL LOW (ref 3.87–5.11)
RDW: 15.6 % — ABNORMAL HIGH (ref 11.5–15.5)
WBC Count: 3 10*3/uL — ABNORMAL LOW (ref 4.0–10.5)
nRBC: 0 % (ref 0.0–0.2)

## 2020-07-12 LAB — CMP (CANCER CENTER ONLY)
ALT: 18 U/L (ref 0–44)
AST: 21 U/L (ref 15–41)
Albumin: 3.8 g/dL (ref 3.5–5.0)
Alkaline Phosphatase: 93 U/L (ref 38–126)
Anion gap: 6 (ref 5–15)
BUN: 21 mg/dL (ref 8–23)
CO2: 26 mmol/L (ref 22–32)
Calcium: 9.3 mg/dL (ref 8.9–10.3)
Chloride: 105 mmol/L (ref 98–111)
Creatinine: 1.28 mg/dL — ABNORMAL HIGH (ref 0.44–1.00)
GFR, Estimated: 40 mL/min — ABNORMAL LOW (ref >60)
Glucose, Bld: 70 mg/dL (ref 70–99)
Potassium: 4 mmol/L (ref 3.5–5.1)
Sodium: 137 mmol/L (ref 135–145)
Total Bilirubin: 0.5 mg/dL (ref 0.3–1.2)
Total Protein: 7.5 g/dL (ref 6.5–8.1)

## 2020-07-12 LAB — FERRITIN: Ferritin: 1362 ng/mL — ABNORMAL HIGH (ref 11–307)

## 2020-07-12 MED ORDER — DARBEPOETIN ALFA 150 MCG/0.3ML IJ SOSY
150.0000 ug | PREFILLED_SYRINGE | Freq: Once | INTRAMUSCULAR | Status: AC
  Administered 2020-07-12: 150 ug via SUBCUTANEOUS
  Filled 2020-07-12: qty 0.3

## 2020-07-12 NOTE — Patient Instructions (Signed)
Darbepoetin Alfa injection What is this medicine? DARBEPOETIN ALFA (dar be POE e tin AL fa) helps your body make more red blood cells. It is used to treat anemia caused by chronic kidney failure and chemotherapy. This medicine may be used for other purposes; ask your health care provider or pharmacist if you have questions. COMMON BRAND NAME(S): Aranesp What should I tell my health care provider before I take this medicine? They need to know if you have any of these conditions:  blood clotting disorders or history of blood clots  cancer patient not on chemotherapy  cystic fibrosis  heart disease, such as angina, heart failure, or a history of a heart attack  hemoglobin level of 12 g/dL or greater  high blood pressure  low levels of folate, iron, or vitamin B12  seizures  an unusual or allergic reaction to darbepoetin, erythropoietin, albumin, hamster proteins, latex, other medicines, foods, dyes, or preservatives  pregnant or trying to get pregnant  breast-feeding How should I use this medicine? This medicine is for injection into a vein or under the skin. It is usually given by a health care professional in a hospital or clinic setting. If you get this medicine at home, you will be taught how to prepare and give this medicine. Use exactly as directed. Take your medicine at regular intervals. Do not take your medicine more often than directed. It is important that you put your used needles and syringes in a special sharps container. Do not put them in a trash can. If you do not have a sharps container, call your pharmacist or healthcare provider to get one. A special MedGuide will be given to you by the pharmacist with each prescription and refill. Be sure to read this information carefully each time. Talk to your pediatrician regarding the use of this medicine in children. While this medicine may be used in children as young as 1 month of age for selected conditions, precautions do  apply. Overdosage: If you think you have taken too much of this medicine contact a poison control center or emergency room at once. NOTE: This medicine is only for you. Do not share this medicine with others. What if I miss a dose? If you miss a dose, take it as soon as you can. If it is almost time for your next dose, take only that dose. Do not take double or extra doses. What may interact with this medicine? Do not take this medicine with any of the following medications:  epoetin alfa This list may not describe all possible interactions. Give your health care provider a list of all the medicines, herbs, non-prescription drugs, or dietary supplements you use. Also tell them if you smoke, drink alcohol, or use illegal drugs. Some items may interact with your medicine. What should I watch for while using this medicine? Your condition will be monitored carefully while you are receiving this medicine. You may need blood work done while you are taking this medicine. This medicine may cause a decrease in vitamin B6. You should make sure that you get enough vitamin B6 while you are taking this medicine. Discuss the foods you eat and the vitamins you take with your health care professional. What side effects may I notice from receiving this medicine? Side effects that you should report to your doctor or health care professional as soon as possible:  allergic reactions like skin rash, itching or hives, swelling of the face, lips, or tongue  breathing problems  changes in   vision  chest pain  confusion, trouble speaking or understanding  feeling faint or lightheaded, falls  high blood pressure  muscle aches or pains  pain, swelling, warmth in the leg  rapid weight gain  severe headaches  sudden numbness or weakness of the face, arm or leg  trouble walking, dizziness, loss of balance or coordination  seizures (convulsions)  swelling of the ankles, feet, hands  unusually weak or  tired Side effects that usually do not require medical attention (report to your doctor or health care professional if they continue or are bothersome):  diarrhea  fever, chills (flu-like symptoms)  headaches  nausea, vomiting  redness, stinging, or swelling at site where injected This list may not describe all possible side effects. Call your doctor for medical advice about side effects. You may report side effects to FDA at 1-800-FDA-1088. Where should I keep my medicine? Keep out of the reach of children. Store in a refrigerator between 2 and 8 degrees C (36 and 46 degrees F). Do not freeze. Do not shake. Throw away any unused portion if using a single-dose vial. Throw away any unused medicine after the expiration date. NOTE: This sheet is a summary. It may not cover all possible information. If you have questions about this medicine, talk to your doctor, pharmacist, or health care provider.  2020 Elsevier/Gold Standard (2017-08-06 16:44:20)  

## 2020-07-12 NOTE — Progress Notes (Signed)
Patient stable at time of discharge. 

## 2020-07-12 NOTE — Progress Notes (Signed)
   Covid-19 Vaccination Clinic  Name:  Alison Bridges    MRN: [id number] DOB: April 11, 1929  07/12/2020  Alison Bridges was observed post Covid-19 immunization for 15 minutes without incident. She was provided with Vaccine Information Sheet and instruction to access the V-Safe system.   Alison Bridges was instructed to call 911 with any severe reactions post vaccine: Marland Kitchen Difficulty breathing  . Swelling of face and throat  . A fast heartbeat  . A bad rash all over body  . Dizziness and weakness   Immunizations Administered    Name Date Dose VIS Date [place] COVID-19 Vaccine 07/12/2020 11:24 AM 0.3 mL 05/24/2020 Intramuscular   Manufacturer: [employer]ine   Lot: [id number]   [place]: [id number]

## 2020-07-21 DIAGNOSIS — M545 Low back pain, unspecified: Secondary | ICD-10-CM | POA: Diagnosis not present

## 2020-07-21 DIAGNOSIS — N390 Urinary tract infection, site not specified: Secondary | ICD-10-CM | POA: Diagnosis not present

## 2020-08-14 ENCOUNTER — Ambulatory Visit
Admission: RE | Admit: 2020-08-14 | Discharge: 2020-08-14 | Disposition: A | Discharge status: Home / Self Care | Payer: Medicare Other | Source: Ambulatory Visit | Attending: Internal Medicine | Admitting: Internal Medicine

## 2020-08-14 ENCOUNTER — Other Ambulatory Visit: Payer: Self-pay | Admitting: Internal Medicine

## 2020-08-14 ENCOUNTER — Other Ambulatory Visit: Payer: Self-pay

## 2020-08-14 DIAGNOSIS — D494 Neoplasm of unspecified behavior of bladder: Secondary | ICD-10-CM | POA: Diagnosis not present

## 2020-08-14 DIAGNOSIS — E785 Hyperlipidemia, unspecified: Secondary | ICD-10-CM | POA: Diagnosis not present

## 2020-08-14 DIAGNOSIS — D649 Anemia, unspecified: Secondary | ICD-10-CM

## 2020-08-14 DIAGNOSIS — D61818 Other pancytopenia: Secondary | ICD-10-CM | POA: Diagnosis not present

## 2020-08-14 DIAGNOSIS — R630 Anorexia: Secondary | ICD-10-CM | POA: Diagnosis not present

## 2020-08-14 DIAGNOSIS — N200 Calculus of kidney: Secondary | ICD-10-CM | POA: Diagnosis not present

## 2020-08-14 DIAGNOSIS — M4319 Spondylolisthesis, multiple sites in spine: Secondary | ICD-10-CM | POA: Diagnosis not present

## 2020-08-14 DIAGNOSIS — F329 Major depressive disorder, single episode, unspecified: Secondary | ICD-10-CM | POA: Diagnosis not present

## 2020-08-14 DIAGNOSIS — D469 Myelodysplastic syndrome, unspecified: Secondary | ICD-10-CM | POA: Diagnosis not present

## 2020-08-14 DIAGNOSIS — I491 Atrial premature depolarization: Secondary | ICD-10-CM | POA: Diagnosis not present

## 2020-08-14 DIAGNOSIS — D509 Iron deficiency anemia, unspecified: Secondary | ICD-10-CM | POA: Diagnosis not present

## 2020-08-14 DIAGNOSIS — R109 Unspecified abdominal pain: Secondary | ICD-10-CM

## 2020-08-14 DIAGNOSIS — N281 Cyst of kidney, acquired: Secondary | ICD-10-CM | POA: Diagnosis not present

## 2020-08-14 DIAGNOSIS — E039 Hypothyroidism, unspecified: Secondary | ICD-10-CM | POA: Diagnosis not present

## 2020-08-14 DIAGNOSIS — R102 Pelvic and perineal pain: Secondary | ICD-10-CM | POA: Diagnosis not present

## 2020-08-14 MED ORDER — IOPAMIDOL (ISOVUE-300) INJECTION 61%
100.0000 mL | Freq: Once | INTRAVENOUS | Status: AC | PRN
  Administered 2020-08-14: 100 mL via INTRAVENOUS

## 2020-08-16 ENCOUNTER — Inpatient Hospital Stay: Payer: Medicare Other

## 2020-08-16 ENCOUNTER — Inpatient Hospital Stay (HOSPITAL_BASED_OUTPATIENT_CLINIC_OR_DEPARTMENT_OTHER): Payer: Medicare Other | Admitting: Hematology

## 2020-08-16 ENCOUNTER — Other Ambulatory Visit: Payer: Self-pay

## 2020-08-16 ENCOUNTER — Inpatient Hospital Stay: Payer: Medicare Other | Attending: Hematology

## 2020-08-16 VITALS — BP 138/60 | HR 81 | Temp 97.6°F | Resp 17 | Ht 68.0 in | Wt 120.9 lb

## 2020-08-16 VITALS — BP 122/56 | HR 72 | Resp 17

## 2020-08-16 DIAGNOSIS — D469 Myelodysplastic syndrome, unspecified: Secondary | ICD-10-CM | POA: Diagnosis not present

## 2020-08-16 DIAGNOSIS — Z79899 Other long term (current) drug therapy: Secondary | ICD-10-CM | POA: Diagnosis not present

## 2020-08-16 DIAGNOSIS — D649 Anemia, unspecified: Secondary | ICD-10-CM

## 2020-08-16 LAB — CBC WITH DIFFERENTIAL (CANCER CENTER ONLY)
Abs Immature Granulocytes: 0.1 10*3/uL — ABNORMAL HIGH (ref 0.00–0.07)
Basophils Absolute: 0 10*3/uL (ref 0.0–0.1)
Basophils Relative: 0 %
Eosinophils Absolute: 0 10*3/uL (ref 0.0–0.5)
Eosinophils Relative: 0 %
HCT: 28.6 % — ABNORMAL LOW (ref 36.0–46.0)
Hemoglobin: 9 g/dL — ABNORMAL LOW (ref 12.0–15.0)
Immature Granulocytes: 4 %
Lymphocytes Relative: 40 %
Lymphs Abs: 1.1 10*3/uL (ref 0.7–4.0)
MCH: 34 pg (ref 26.0–34.0)
MCHC: 31.5 g/dL (ref 30.0–36.0)
MCV: 107.9 fL — ABNORMAL HIGH (ref 80.0–100.0)
Monocytes Absolute: 0.9 10*3/uL (ref 0.1–1.0)
Monocytes Relative: 33 %
Neutro Abs: 0.6 10*3/uL — ABNORMAL LOW (ref 1.7–7.7)
Neutrophils Relative %: 23 %
Platelet Count: 73 10*3/uL — ABNORMAL LOW (ref 150–400)
RBC: 2.65 MIL/uL — ABNORMAL LOW (ref 3.87–5.11)
RDW: 15.8 % — ABNORMAL HIGH (ref 11.5–15.5)
WBC Count: 2.8 10*3/uL — ABNORMAL LOW (ref 4.0–10.5)
nRBC: 0 % (ref 0.0–0.2)

## 2020-08-16 LAB — CMP (CANCER CENTER ONLY)
ALT: 18 U/L (ref 0–44)
AST: 20 U/L (ref 15–41)
Albumin: 3.8 g/dL (ref 3.5–5.0)
Alkaline Phosphatase: 95 U/L (ref 38–126)
Anion gap: 6 (ref 5–15)
BUN: 18 mg/dL (ref 8–23)
CO2: 24 mmol/L (ref 22–32)
Calcium: 9 mg/dL (ref 8.9–10.3)
Chloride: 106 mmol/L (ref 98–111)
Creatinine: 1.26 mg/dL — ABNORMAL HIGH (ref 0.44–1.00)
GFR, Estimated: 41 mL/min — ABNORMAL LOW (ref >60)
Glucose, Bld: 79 mg/dL (ref 70–99)
Potassium: 4.4 mmol/L (ref 3.5–5.1)
Sodium: 136 mmol/L (ref 135–145)
Total Bilirubin: 0.5 mg/dL (ref 0.3–1.2)
Total Protein: 7.6 g/dL (ref 6.5–8.1)

## 2020-08-16 LAB — IRON AND TIBC
Iron: 90 ug/dL (ref 41–142)
Saturation Ratios: 43 % (ref 21–57)
TIBC: 211 ug/dL — ABNORMAL LOW (ref 236–444)
UIBC: 121 ug/dL (ref 120–384)

## 2020-08-16 LAB — FERRITIN: Ferritin: 1482 ng/mL — ABNORMAL HIGH (ref 11–307)

## 2020-08-16 MED ORDER — DARBEPOETIN ALFA 150 MCG/0.3ML IJ SOSY
150.0000 ug | PREFILLED_SYRINGE | Freq: Once | INTRAMUSCULAR | Status: AC
  Administered 2020-08-16: 150 ug via SUBCUTANEOUS
  Filled 2020-08-16: qty 0.3

## 2020-08-16 NOTE — Patient Instructions (Signed)
Darbepoetin Alfa injection What is this medicine? DARBEPOETIN ALFA (dar be POE e tin AL fa) helps your body make more red blood cells. It is used to treat anemia caused by chronic kidney failure and chemotherapy. This medicine may be used for other purposes; ask your health care provider or pharmacist if you have questions. COMMON BRAND NAME(S): Aranesp What should I tell my health care provider before I take this medicine? They need to know if you have any of these conditions:  blood clotting disorders or history of blood clots  cancer patient not on chemotherapy  cystic fibrosis  heart disease, such as angina, heart failure, or a history of a heart attack  hemoglobin level of 12 g/dL or greater  high blood pressure  low levels of folate, iron, or vitamin B12  seizures  an unusual or allergic reaction to darbepoetin, erythropoietin, albumin, hamster proteins, latex, other medicines, foods, dyes, or preservatives  pregnant or trying to get pregnant  breast-feeding How should I use this medicine? This medicine is for injection into a vein or under the skin. It is usually given by a health care professional in a hospital or clinic setting. If you get this medicine at home, you will be taught how to prepare and give this medicine. Use exactly as directed. Take your medicine at regular intervals. Do not take your medicine more often than directed. It is important that you put your used needles and syringes in a special sharps container. Do not put them in a trash can. If you do not have a sharps container, call your pharmacist or healthcare provider to get one. A special MedGuide will be given to you by the pharmacist with each prescription and refill. Be sure to read this information carefully each time. Talk to your pediatrician regarding the use of this medicine in children. While this medicine may be used in children as young as 1 month of age for selected conditions, precautions do  apply. Overdosage: If you think you have taken too much of this medicine contact a poison control center or emergency room at once. NOTE: This medicine is only for you. Do not share this medicine with others. What if I miss a dose? If you miss a dose, take it as soon as you can. If it is almost time for your next dose, take only that dose. Do not take double or extra doses. What may interact with this medicine? Do not take this medicine with any of the following medications:  epoetin alfa This list may not describe all possible interactions. Give your health care provider a list of all the medicines, herbs, non-prescription drugs, or dietary supplements you use. Also tell them if you smoke, drink alcohol, or use illegal drugs. Some items may interact with your medicine. What should I watch for while using this medicine? Your condition will be monitored carefully while you are receiving this medicine. You may need blood work done while you are taking this medicine. This medicine may cause a decrease in vitamin B6. You should make sure that you get enough vitamin B6 while you are taking this medicine. Discuss the foods you eat and the vitamins you take with your health care professional. What side effects may I notice from receiving this medicine? Side effects that you should report to your doctor or health care professional as soon as possible:  allergic reactions like skin rash, itching or hives, swelling of the face, lips, or tongue  breathing problems  changes in   vision  chest pain  confusion, trouble speaking or understanding  feeling faint or lightheaded, falls  high blood pressure  muscle aches or pains  pain, swelling, warmth in the leg  rapid weight gain  severe headaches  sudden numbness or weakness of the face, arm or leg  trouble walking, dizziness, loss of balance or coordination  seizures (convulsions)  swelling of the ankles, feet, hands  unusually weak or  tired Side effects that usually do not require medical attention (report to your doctor or health care professional if they continue or are bothersome):  diarrhea  fever, chills (flu-like symptoms)  headaches  nausea, vomiting  redness, stinging, or swelling at site where injected This list may not describe all possible side effects. Call your doctor for medical advice about side effects. You may report side effects to FDA at 1-800-FDA-1088. Where should I keep my medicine? Keep out of the reach of children. Store in a refrigerator between 2 and 8 degrees C (36 and 46 degrees F). Do not freeze. Do not shake. Throw away any unused portion if using a single-dose vial. Throw away any unused medicine after the expiration date. NOTE: This sheet is a summary. It may not cover all possible information. If you have questions about this medicine, talk to your doctor, pharmacist, or health care provider.  2021 Elsevier/Gold Standard (2017-08-06 16:44:20)  

## 2020-08-16 NOTE — Progress Notes (Signed)
HEMATOLOGY/ONCOLOGY CLINIC NOTE  Date of Service: 08/16/2020  Patient Care Team: Burnard Bunting, MD as PCP - General (Internal Medicine)  REFERRING PHYSICIAN: Burnard Bunting, MD  CHIEF COMPLAINTS/PURPOSE OF CONSULTATION:  Pancytopenia  HISTORY OF PRESENTING ILLNESS:  Alison Bridges is a wonderful 87 y.o. female who has been referred to [place] by Burnard Bunting, MD for evaluation and management of Pancytopenia. The pt reports that she is doing well overall.   The pt reports she is good. Pt lives alone. Prior to the pandemic she was taking a workout class. About the last 3 months pt has had fatigue, loss of appetite, and weight loss. Last October pt had fatigue, SOB, and was spitting up blood. She has acid reflux and had esophagus dilated twice. A week ago pt was in the hospital due to infection in her right ankle. Her ankle started swelling and turning red. The ankle was not painful but did have burning and tightness. She was put on antibiotics and iron.  Pt uses partial dentures and does not use denture cream. She has been having a burning and hot feeling in feet and face that comes and goes. Her thyroid medication has been stable and she takes the pills several hours before eating with just water. Pt has loss about 10 pounds due to not having appetite. Recently she has had spontaneous bruising. She has had both doses of COVID19 vaccine. Pt had been taking 50mg  of zync for 4-6weeks prior to hospitalization.   Of note prior to the patient's visit today, pt has had DG Ankle Complete Right ([id number]) completed on 12/08/19 with results revealing "Soft tissue swelling without acute osseous finding." Pt has had Lower Venous DVT Study completed on 12/09/19 with results revealing "RIGHT: There is no evidence of deep vein thrombosis in the lower extremity. No cystic structure found in the popliteal fossa. LEFT: No evidence of common femoral vein obstruction." Pt has had CT Abdomen Pelvis W IV  Contrast completed on 10/07/19 with results revealing, " 1. No acute finding 2. Areas of enhancement in the right bladder worrisome for neoplasm 3. Cholecystectomy 4. Bilateral renal cortical cysts and nonobstructive left renal calculi. 5. Aortoiliac atherosclerosis without aneurysm. 6. Hysterectomy 7. Diverticulosis without diverticulitis. 8. Lumbar DDD and DJD with degenerative anterolisthesis of L5 on S1"  Most recent lab results (12/10/19) of CBC is as follows: all values are WNL except for WBC at 2.9K, RBC at 2.35, Hemoglobin at 8.2, HCT at 25.1, MCV at 106.8, MCH at 34.9, Platelets at 66K, Neutro Abs at 0.6K, Monocytes Absolute at 1.4K, Glucose at 105, Calcium at 8.4, GFR, Calc Non Af Amer at 54 12/08/19 of Lactic Acid, Venous at 1.3 12/08/19 of CRP at 0.8: WNL 12/08/19 of Reticulocytes is as follows: all values are WNL except for RC at 2.52, Immature Retic Fract at 16.9 12/08/19 of Ferritin at 167: WNL 12/08/19 of Iron and TIBC is as follows: all values are WNL except for Iron at 27, Saturation Ratios at 10 12/08/19 of Folate at 52.3: WNL 12/08/19 of Vitamin B12 at 931 12/08/19 of Sed Rate at 60  On review of systems, pt reports fatigue, lack of appetite, burning/hot sensation in face and feet, weight loss  and denies lumps/bumps, skin rashes, bone pain, fever, chills, night sweats, changes in bowl habits and urinary habits, nose bleeds, gum bleeds, blood in urine/stool, lightheaded, dizzy, changes in breathing, back pain, constipation and any other symptoms.   INTERVAL HISTORY:   Alison Bridges is a  wonderful 87 y.o. female who is here for evaluation and management of Pancytopenia - likely from MDS +/- MPN. The patient's last visit with [place] was on 05/17/2020. The pt reports that he is doing well overall.  The pt reports no acute new symptoms.  Lab results today (08/16/20) of CBC ,cmp reviewed with patient.Marland Kitchen 08/16/2020 Ferritin at 1482 with 42 iron sauraion  MEDICAL HISTORY:  Past  Medical History:  Diagnosis Date  . Depression   . Hypothyroidism   . Osteoporosis    SURGICAL HISTORY: Past Surgical History:  Procedure Laterality Date  . ABDOMINAL HYSTERECTOMY    . APPENDECTOMY  1998  . CHOLECYSTECTOMY  1998   SOCIAL HISTORY: Social History   Socioeconomic History  . Marital status: Widowed    Spouse name: Not on file  . Number of children: Not on file  . Years of education: Not on file  . Highest education level: Not on file  Occupational History  . Occupation: Oncologist    Comment: Retired  Tobacco Use  . Smoking status: Never Smoker  . Smokeless tobacco: Never Used  Substance and Sexual Activity  . Alcohol use: No  . Drug use: No  . Sexual activity: Not on file  Other Topics Concern  . Not on file  Social History Narrative   She is tried to stay active --  2 days a week she does senior aerobics.  She also enjoys going for walks, but no standard routine.      She has 3 children and 2 grandchildren.   Social Determinants of Health   Financial Resource Strain: Not on file  Food Insecurity: No Food Insecurity  . Worried About Charity fundraiser in the Last Year: Never true  . Ran Out of Food in the Last Year: Never true  Transportation Needs: No Transportation Needs  . Lack of Transportation (Medical): No  . Lack of Transportation (Non-Medical): No  Physical Activity: Not on file  Stress: Not on file  Social Connections: Not on file  Intimate Partner Violence: Not on file     FAMILY HISTORY: Family History  Problem Relation Age of Onset  . Heart attack Mother 42  . Pancreatic cancer Father   . Parkinson's disease Sister   . Diabetes Brother        Multiple complications  . Heart disease Brother        Not sure what happened  . Dementia Brother      ALLERGIES:   has No Known Allergies.   MEDICATIONS:  Current Outpatient Medications  Medication Sig Dispense Refill  . Ascorbic Acid (VITAMIN C PO) Take 1 tablet by  mouth daily.    . cholecalciferol (VITAMIN D) 1000 UNITS tablet Take 1,000 Units by mouth daily.    . ferrous sulfate 325 (65 FE) MG tablet Take 1 tablet (325 mg total) by mouth daily. (Patient not taking: Reported on 03/22/2020) 30 tablet 1  . levothyroxine (SYNTHROID, LEVOTHROID) 125 MCG tablet Take 125 mcg by mouth daily before breakfast.    . methocarbamol (ROBAXIN) 750 MG tablet Take 1 tablet (750 mg total) by mouth every 6 (six) hours as needed for muscle spasms. (Patient not taking: Reported on 05/17/2020) 20 tablet 0  . Multiple Vitamin (MULTIVITAMIN WITH MINERALS) TABS tablet Take 1 tablet by mouth daily.    . pantoprazole (PROTONIX) 40 MG tablet Take 40 mg by mouth daily as needed (acid reflux).     . polyethylene glycol (MIRALAX) 17 g packet Take  17 g by mouth daily as needed for mild constipation. (Patient not taking: Reported on 05/17/2020)    . Sennosides (SENOKOT PO) Take 1 tablet by mouth at bedtime as needed.    . sertraline (ZOLOFT) 100 MG tablet Take 100 mg by mouth daily.     No current facility-administered medications for this visit.     REVIEW OF SYSTEMS:   A 10+ POINT REVIEW OF SYSTEMS WAS OBTAINED including neurology, dermatology, psychiatry, cardiac, respiratory, lymph, extremities, GI, GU, Musculoskeletal, constitutional, breasts, reproductive, HEENT.  All pertinent positives are noted in the HPI.  All others are negative.   PHYSICAL EXAMINATION: ECOG PERFORMANCE STATUS: 2 - Symptomatic, <50% confined to bed  Vitals:   08/16/20 1019  BP: 138/60  Pulse: 81  Resp: 17  Temp: 97.6 F (36.4 C)  SpO2: 97%   Filed Weights   08/16/20 1019  Weight: 120 lb 14.4 oz (54.8 kg)   Body mass index is 18.38 kg/m.  NAD GENERAL:alert, in no acute distress and comfortable SKIN: no acute rashes, no significant lesions EYES: conjunctiva are pink and non-injected, sclera anicteric OROPHARYNX: MMM, no exudates, no oropharyngeal erythema or ulceration NECK: supple, no  JVD LYMPH:  no palpable lymphadenopathy in the cervical, axillary or inguinal regions LUNGS: clear to auscultation b/l with normal respiratory effort HEART: regular rate & rhythm ABDOMEN:  normoactive bowel sounds , non tender, not distended. No palpable hepatosplenomegaly.  Extremity: no pedal edema PSYCH: alert & oriented x 3 with fluent speech NEURO: no focal motor/sensory deficits  LABORATORY DATA:  I have reviewed the data as listed  CBC Latest Ref Rng & Units 08/16/2020 07/12/2020 06/14/2020  WBC 4.0 - 10.5 K/uL 2.8(L) 3.0(L) 2.7(L)  Hemoglobin 12.0 - 15.0 g/dL 9.0(L) 9.6(L) 9.3(L)  Hematocrit 36.0 - 46.0 % 28.6(L) 30.2(L) 28.4(L)  Platelets 150 - 400 K/uL 73(L) 64(L) 68(L)    CMP Latest Ref Rng & Units 08/16/2020 07/12/2020 06/14/2020  Glucose 70 - 99 mg/dL 79 70 83  BUN 8 - 23 mg/dL 18 21 24(H)  Creatinine 0.44 - 1.00 mg/dL 1.26(H) 1.28(H) 1.19(H)  Sodium 135 - 145 mmol/L 136 137 138  Potassium 3.5 - 5.1 mmol/L 4.4 4.0 4.2  Chloride 98 - 111 mmol/L 106 105 105  CO2 22 - 32 mmol/L 24 26 25   Calcium 8.9 - 10.3 mg/dL 9.0 9.3 8.8(L)  Total Protein 6.5 - 8.1 g/dL 7.6 7.5 7.4  Total Bilirubin 0.3 - 1.2 mg/dL 0.5 0.5 0.6  Alkaline Phos 38 - 126 U/L 95 93 101  AST 15 - 41 U/L 20 21 22   ALT 0 - 44 U/L 18 18 22    . Lab Results  Component Value Date   IRON 90 08/16/2020   TIBC 211 (L) 08/16/2020   IRONPCTSAT 43 08/16/2020   (Iron and TIBC)  Lab Results  Component Value Date   FERRITIN 1,482 (H) 08/16/2020     01/10/2020 Cytogenetics [phone]):    01/10/2020 FISH Panel [id number]):   01/10/2020 BM Bx Report ([id number]):    12/16/2019 JAK2, MPL, CALR Panel Report:    RADIOGRAPHIC STUDIES: I have personally reviewed the radiological images as listed and agreed with the findings in the report. CT ABDOMEN PELVIS W CONTRAST  Result Date: 08/14/2020 CLINICAL DATA:  Low back and pelvic pain for 1 month, mild dysplastic syndrome, status post appendectomy  hysterectomy and cholecystectomy EXAM: CT ABDOMEN AND PELVIS WITH CONTRAST TECHNIQUE: Multidetector CT imaging of the abdomen and pelvis was performed using the standard protocol following bolus  administration of intravenous contrast. CONTRAST:  160mL ISOVUE-300 IOPAMIDOL (ISOVUE-300) INJECTION 61% COMPARISON:  03/22/2020 FINDINGS: Lower chest: No acute abnormality. The lower esophagus is air and fluid-filled. Hepatobiliary: No focal liver abnormality is seen. Hepatomegaly, maximum coronal span 24.4 cm. Status post cholecystectomy. No biliary dilatation. Pancreas: Unremarkable. No pancreatic ductal dilatation or surrounding inflammatory changes. Spleen: Splenomegaly, maximum coronal span 14.7 cm. Adrenals/Urinary Tract: Adrenal glands are unremarkable. Small nonobstructive calculi of the superior and inferior poles of the left kidney. No hydronephrosis. Exophytic cysts of the left kidney. Bladder is unremarkable. Stomach/Bowel: Stomach is within normal limits. The cecum is unusually low lying, within the posterior low pelvis (series 2, image 69), and the appendix is surgically absent. No evidence of bowel wall thickening, distention, or inflammatory changes. Vascular/Lymphatic: Aortic atherosclerosis. No enlarged abdominal or pelvic lymph nodes. Reproductive: Status post hysterectomy. Other: No abdominal wall hernia or abnormality. No abdominopelvic ascites. Musculoskeletal: No acute or significant osseous findings. Degenerative anterolisthesis of L5 on S1 with associated facet degenerative change of the lower lumbar spine. IMPRESSION: 1. No acute CT findings of the abdomen or pelvis to explain pain. 2. The lower esophagus is air and fluid-filled, suggesting reflux. 3. Hepatosplenomegaly, unchanged compared to prior. 4. Nonobstructive left nephrolithiasis.  No hydronephrosis. 5. Postoperative findings of cholecystectomy, hysterectomy, and appendectomy. Aortic Atherosclerosis (ICD10-I70.0). Electronically Signed    By: Eddie Candle M.D.   On: 08/14/2020 14:17     ASSESSMENT & PLAN:  MONTIE GELARDI is a 87 y.o. female with:   1. Pancytopenia - likely from MDS +/- MPN -01/10/2020 BM Bx Surgical Pathology Report ([id number]) revealed "BONE MARROW, ASPIRATE, CLOT, CORE: -Hypercellular marrow with myeloid hyperplasia and megakaryocytic atypia  PERIPHERAL BLOOD: - Pancytopenia". -12/16/19 JAK2 (including V617 and Exon 12), MPL, and CALR-Next Generation Sequencing shows "Tier II: Variants of Potential Clinical Significance - SRSF2 p.Pro95His" 2. Bladder lesion -- patient following with urology PLAN: -blood counts stable -Continue monthly Aranesp at the lowest possible dose for goal Hgb ? 10. -Continue daily B-complex vitamin - no indication for additional IV Iron at this  FOLLOW UP: Plz schedule next 6 doses of monthly Aranesp injections with labs MD visit in 3 months   The total time spent in the appt was 20 minutes and more than 50% was on counseling and direct patient cares.  All of the patient's questions were answered with apparent satisfaction. The patient knows to call the clinic with any problems, questions or concerns.   Sullivan Lone MD [place] AAHIVMS [facility] Hematology/Oncology Physician [facility]  (Office):       [phone] (Work cell):  [phone] (Fax):           [phone]  08/16/2020 3:17 AM  I, Yevette Edwards, am acting as a scribe for Dr. Sullivan Lone.   .I have reviewed the above documentation for accuracy and completeness, and I agree with the above. [name] MD

## 2020-08-22 NOTE — Progress Notes (Incomplete)
HEMATOLOGY/ONCOLOGY CLINIC NOTE  Date of Service: 08/16/2020  Patient Care Team: Burnard Bunting, MD as PCP - General (Internal Medicine)  REFERRING PHYSICIAN: Burnard Bunting, MD  CHIEF COMPLAINTS/PURPOSE OF CONSULTATION:  Pancytopenia  HISTORY OF PRESENTING ILLNESS:  Alison Bridges is a wonderful 87 y.o. female who has been referred to [place] by Burnard Bunting, MD for evaluation and management of Pancytopenia. The pt reports that she is doing well overall.   The pt reports she is good. Pt lives alone. Prior to the pandemic she was taking a workout class. About the last 3 months pt has had fatigue, loss of appetite, and weight loss. Last October pt had fatigue, SOB, and was spitting up blood. She has acid reflux and had esophagus dilated twice. A week ago pt was in the hospital due to infection in her right ankle. Her ankle started swelling and turning red. The ankle was not painful but did have burning and tightness. She was put on antibiotics and iron.  Pt uses partial dentures and does not use denture cream. She has been having a burning and hot feeling in feet and face that comes and goes. Her thyroid medication has been stable and she takes the pills several hours before eating with just water. Pt has loss about 10 pounds due to not having appetite. Recently she has had spontaneous bruising. She has had both doses of COVID19 vaccine. Pt had been taking 50mg  of zync for 4-6weeks prior to hospitalization.   Of note prior to the patient's visit today, pt has had DG Ankle Complete Right ([id number]) completed on 12/08/19 with results revealing "Soft tissue swelling without acute osseous finding." Pt has had Lower Venous DVT Study completed on 12/09/19 with results revealing "RIGHT: There is no evidence of deep vein thrombosis in the lower extremity. No cystic structure found in the popliteal fossa. LEFT: No evidence of common femoral vein obstruction." Pt has had CT Abdomen Pelvis W IV  Contrast completed on 10/07/19 with results revealing, " 1. No acute finding 2. Areas of enhancement in the right bladder worrisome for neoplasm 3. Cholecystectomy 4. Bilateral renal cortical cysts and nonobstructive left renal calculi. 5. Aortoiliac atherosclerosis without aneurysm. 6. Hysterectomy 7. Diverticulosis without diverticulitis. 8. Lumbar DDD and DJD with degenerative anterolisthesis of L5 on S1"  Most recent lab results (12/10/19) of CBC is as follows: all values are WNL except for WBC at 2.9K, RBC at 2.35, Hemoglobin at 8.2, HCT at 25.1, MCV at 106.8, MCH at 34.9, Platelets at 66K, Neutro Abs at 0.6K, Monocytes Absolute at 1.4K, Glucose at 105, Calcium at 8.4, GFR, Calc Non Af Amer at 54 12/08/19 of Lactic Acid, Venous at 1.3 12/08/19 of CRP at 0.8: WNL 12/08/19 of Reticulocytes is as follows: all values are WNL except for RC at 2.52, Immature Retic Fract at 16.9 12/08/19 of Ferritin at 167: WNL 12/08/19 of Iron and TIBC is as follows: all values are WNL except for Iron at 27, Saturation Ratios at 10 12/08/19 of Folate at 52.3: WNL 12/08/19 of Vitamin B12 at 931 12/08/19 of Sed Rate at 60  On review of systems, pt reports fatigue, lack of appetite, burning/hot sensation in face and feet, weight loss  and denies lumps/bumps, skin rashes, bone pain, fever, chills, night sweats, changes in bowl habits and urinary habits, nose bleeds, gum bleeds, blood in urine/stool, lightheaded, dizzy, changes in breathing, back pain, constipation and any other symptoms.   INTERVAL HISTORY:   Alison Bridges is a  wonderful 87 y.o. female who is here for evaluation and management of Pancytopenia - likely from MDS +/- MPN. The patient's last visit with [place] was on 05/17/2020. The pt reports that he is doing well overall.  The pt reports ***  Lab results today (08/16/20) of CBC w/diff and CMP is as follows: all values are WNL except for ***. 08/16/2020 Ferritin at *** 08/16/2020 Iron Panel is as follows:  ***  On review of systems, pt reports *** and denies ***and any other symptoms.   A&P: -Discussed pt labwork today, 08/16/20; *** -***  MEDICAL HISTORY:  Past Medical History:  Diagnosis Date  . Depression   . Hypothyroidism   . Osteoporosis    SURGICAL HISTORY: Past Surgical History:  Procedure Laterality Date  . ABDOMINAL HYSTERECTOMY    . APPENDECTOMY  1998  . CHOLECYSTECTOMY  1998   SOCIAL HISTORY: Social History   Socioeconomic History  . Marital status: Widowed    Spouse name: Not on file  . Number of children: Not on file  . Years of education: Not on file  . Highest education level: Not on file  Occupational History  . Occupation: Oncologist    Comment: Retired  Tobacco Use  . Smoking status: Never Smoker  . Smokeless tobacco: Never Used  Substance and Sexual Activity  . Alcohol use: No  . Drug use: No  . Sexual activity: Not on file  Other Topics Concern  . Not on file  Social History Narrative   She is tried to stay active --  2 days a week she does senior aerobics.  She also enjoys going for walks, but no standard routine.      She has 3 children and 2 grandchildren.   Social Determinants of Health   Financial Resource Strain: Not on file  Food Insecurity: No Food Insecurity  . Worried About Charity fundraiser in the Last Year: Never true  . Ran Out of Food in the Last Year: Never true  Transportation Needs: No Transportation Needs  . Lack of Transportation (Medical): No  . Lack of Transportation (Non-Medical): No  Physical Activity: Not on file  Stress: Not on file  Social Connections: Not on file  Intimate Partner Violence: Not on file     FAMILY HISTORY: Family History  Problem Relation Age of Onset  . Heart attack Mother 66  . Pancreatic cancer Father   . Parkinson's disease Sister   . Diabetes Brother        Multiple complications  . Heart disease Brother        Not sure what happened  . Dementia Brother       ALLERGIES:   has No Known Allergies.   MEDICATIONS:  Current Outpatient Medications  Medication Sig Dispense Refill  . Ascorbic Acid (VITAMIN C PO) Take 1 tablet by mouth daily.    . cholecalciferol (VITAMIN D) 1000 UNITS tablet Take 1,000 Units by mouth daily.    . ferrous sulfate 325 (65 FE) MG tablet Take 1 tablet (325 mg total) by mouth daily. (Patient not taking: Reported on 03/22/2020) 30 tablet 1  . levothyroxine (SYNTHROID, LEVOTHROID) 125 MCG tablet Take 125 mcg by mouth daily before breakfast.    . methocarbamol (ROBAXIN) 750 MG tablet Take 1 tablet (750 mg total) by mouth every 6 (six) hours as needed for muscle spasms. (Patient not taking: Reported on 05/17/2020) 20 tablet 0  . Multiple Vitamin (MULTIVITAMIN WITH MINERALS) TABS tablet Take 1 tablet by  mouth daily.    . pantoprazole (PROTONIX) 40 MG tablet Take 40 mg by mouth daily as needed (acid reflux).     . polyethylene glycol (MIRALAX) 17 g packet Take 17 g by mouth daily as needed for mild constipation. (Patient not taking: Reported on 05/17/2020)    . Sennosides (SENOKOT PO) Take 1 tablet by mouth at bedtime as needed.    . sertraline (ZOLOFT) 100 MG tablet Take 100 mg by mouth daily.     No current facility-administered medications for this visit.     REVIEW OF SYSTEMS:   A 10+ POINT REVIEW OF SYSTEMS WAS OBTAINED including neurology, dermatology, psychiatry, cardiac, respiratory, lymph, extremities, GI, GU, Musculoskeletal, constitutional, breasts, reproductive, HEENT.  All pertinent positives are noted in the HPI.  All others are negative.   PHYSICAL EXAMINATION: ECOG PERFORMANCE STATUS: 2 - Symptomatic, <50% confined to bed  There were no vitals filed for this visit. There were no vitals filed for this visit. There is no height or weight on file to calculate BMI.  *** GENERAL:alert, in no acute distress and comfortable SKIN: no acute rashes, no significant lesions EYES: conjunctiva are pink and  non-injected, sclera anicteric OROPHARYNX: MMM, no exudates, no oropharyngeal erythema or ulceration NECK: supple, no JVD LYMPH:  no palpable lymphadenopathy in the cervical, axillary or inguinal regions LUNGS: clear to auscultation b/l with normal respiratory effort HEART: regular rate & rhythm ABDOMEN:  normoactive bowel sounds , non tender, not distended. No palpable hepatosplenomegaly.  Extremity: no pedal edema PSYCH: alert & oriented x 3 with fluent speech NEURO: no focal motor/sensory deficits  LABORATORY DATA:  I have reviewed the data as listed  CBC Latest Ref Rng & Units 07/12/2020 06/14/2020 05/17/2020  WBC 4.0 - 10.5 K/uL 3.0(L) 2.7(L) 2.7(L)  Hemoglobin 12.0 - 15.0 g/dL 9.6(L) 9.3(L) 9.4(L)  Hematocrit 36.0 - 46.0 % 30.2(L) 28.4(L) 29.7(L)  Platelets 150 - 400 K/uL 64(L) 68(L) 60(L)    CMP Latest Ref Rng & Units 07/12/2020 06/14/2020 05/17/2020  Glucose 70 - 99 mg/dL 70 83 75  BUN 8 - 23 mg/dL 21 24(H) 18  Creatinine 0.44 - 1.00 mg/dL 1.28(H) 1.19(H) 1.14(H)  Sodium 135 - 145 mmol/L 137 138 136  Potassium 3.5 - 5.1 mmol/L 4.0 4.2 4.1  Chloride 98 - 111 mmol/L 105 105 105  CO2 22 - 32 mmol/L 26 25 29   Calcium 8.9 - 10.3 mg/dL 9.3 8.8(L) 9.0  Total Protein 6.5 - 8.1 g/dL 7.5 7.4 7.4  Total Bilirubin 0.3 - 1.2 mg/dL 0.5 0.6 0.5  Alkaline Phos 38 - 126 U/L 93 101 94  AST 15 - 41 U/L 21 22 23   ALT 0 - 44 U/L 18 22 23     01/10/2020 Cytogenetics [phone]):    01/10/2020 FISH Panel ([id number]):   01/10/2020 BM Bx Report ([id number]):    12/16/2019 JAK2, MPL, CALR Panel Report:    RADIOGRAPHIC STUDIES: I have personally reviewed the radiological images as listed and agreed with the findings in the report. CT ABDOMEN PELVIS W CONTRAST  Result Date: 08/14/2020 CLINICAL DATA:  Low back and pelvic pain for 1 month, mild dysplastic syndrome, status post appendectomy hysterectomy and cholecystectomy EXAM: CT ABDOMEN AND PELVIS WITH CONTRAST TECHNIQUE:  Multidetector CT imaging of the abdomen and pelvis was performed using the standard protocol following bolus administration of intravenous contrast. CONTRAST:  176mL ISOVUE-300 IOPAMIDOL (ISOVUE-300) INJECTION 61% COMPARISON:  03/22/2020 FINDINGS: Lower chest: No acute abnormality. The lower esophagus is air and fluid-filled. Hepatobiliary: No focal  liver abnormality is seen. Hepatomegaly, maximum coronal span 24.4 cm. Status post cholecystectomy. No biliary dilatation. Pancreas: Unremarkable. No pancreatic ductal dilatation or surrounding inflammatory changes. Spleen: Splenomegaly, maximum coronal span 14.7 cm. Adrenals/Urinary Tract: Adrenal glands are unremarkable. Small nonobstructive calculi of the superior and inferior poles of the left kidney. No hydronephrosis. Exophytic cysts of the left kidney. Bladder is unremarkable. Stomach/Bowel: Stomach is within normal limits. The cecum is unusually low lying, within the posterior low pelvis (series 2, image 69), and the appendix is surgically absent. No evidence of bowel wall thickening, distention, or inflammatory changes. Vascular/Lymphatic: Aortic atherosclerosis. No enlarged abdominal or pelvic lymph nodes. Reproductive: Status post hysterectomy. Other: No abdominal wall hernia or abnormality. No abdominopelvic ascites. Musculoskeletal: No acute or significant osseous findings. Degenerative anterolisthesis of L5 on S1 with associated facet degenerative change of the lower lumbar spine. IMPRESSION: 1. No acute CT findings of the abdomen or pelvis to explain pain. 2. The lower esophagus is air and fluid-filled, suggesting reflux. 3. Hepatosplenomegaly, unchanged compared to prior. 4. Nonobstructive left nephrolithiasis.  No hydronephrosis. 5. Postoperative findings of cholecystectomy, hysterectomy, and appendectomy. Aortic Atherosclerosis (ICD10-I70.0). Electronically Signed   By: Eddie Candle M.D.   On: 08/14/2020 14:17     ASSESSMENT & PLAN:  Alison Bridges  is a 87 y.o. female with:   1. Pancytopenia - likely from MDS +/- MPN -01/10/2020 BM Bx Surgical Pathology Report ([id number]) revealed "BONE MARROW, ASPIRATE, CLOT, CORE: -Hypercellular marrow with myeloid hyperplasia and megakaryocytic atypia  PERIPHERAL BLOOD: - Pancytopenia". -12/16/19 JAK2 (including V617 and Exon 12), MPL, and CALR-Next Generation Sequencing shows "Tier II: Variants of Potential Clinical Significance - SRSF2 p.Pro95His" 2. Bladder lesion -- patient following with urology PLAN: *** -Continue monthly Aranesp at the lowest possible dose for goal Hgb ? 10. -Continue daily B-complex vitamin   FOLLOW UP: ***    The total time spent in the appt was *** minutes and more than 50% was on counseling and direct patient cares.  All of the patient's questions were answered with apparent satisfaction. The patient knows to call the clinic with any problems, questions or concerns.   Sullivan Lone MD [place] AAHIVMS [facility] Hematology/Oncology Physician [facility]  (Office):       [phone] (Work cell):  [phone] (Fax):           [phone]  08/16/2020 3:17 AM  I, Yevette Edwards, am acting as a scribe for Dr. Sullivan Lone.   {Add Barista Statement}

## 2020-08-31 DIAGNOSIS — D414 Neoplasm of uncertain behavior of bladder: Secondary | ICD-10-CM | POA: Diagnosis not present

## 2020-08-31 DIAGNOSIS — R8279 Other abnormal findings on microbiological examination of urine: Secondary | ICD-10-CM | POA: Diagnosis not present

## 2020-09-13 ENCOUNTER — Inpatient Hospital Stay: Payer: Medicare Other | Attending: Hematology

## 2020-09-13 ENCOUNTER — Other Ambulatory Visit: Payer: Self-pay

## 2020-09-13 ENCOUNTER — Other Ambulatory Visit: Payer: Self-pay | Admitting: *Deleted

## 2020-09-13 ENCOUNTER — Inpatient Hospital Stay: Payer: Medicare Other

## 2020-09-13 VITALS — BP 130/58 | HR 76 | Resp 16

## 2020-09-13 DIAGNOSIS — D469 Myelodysplastic syndrome, unspecified: Secondary | ICD-10-CM | POA: Diagnosis not present

## 2020-09-13 DIAGNOSIS — D649 Anemia, unspecified: Secondary | ICD-10-CM

## 2020-09-13 DIAGNOSIS — Z79899 Other long term (current) drug therapy: Secondary | ICD-10-CM | POA: Insufficient documentation

## 2020-09-13 LAB — CBC WITH DIFFERENTIAL (CANCER CENTER ONLY)
Abs Immature Granulocytes: 0.03 10*3/uL (ref 0.00–0.07)
Basophils Absolute: 0 10*3/uL (ref 0.0–0.1)
Basophils Relative: 1 %
Eosinophils Absolute: 0 10*3/uL (ref 0.0–0.5)
Eosinophils Relative: 0 %
HCT: 29.2 % — ABNORMAL LOW (ref 36.0–46.0)
Hemoglobin: 9.2 g/dL — ABNORMAL LOW (ref 12.0–15.0)
Immature Granulocytes: 2 %
Lymphocytes Relative: 45 %
Lymphs Abs: 0.8 10*3/uL (ref 0.7–4.0)
MCH: 34.1 pg — ABNORMAL HIGH (ref 26.0–34.0)
MCHC: 31.5 g/dL (ref 30.0–36.0)
MCV: 108.1 fL — ABNORMAL HIGH (ref 80.0–100.0)
Monocytes Absolute: 0.7 10*3/uL (ref 0.1–1.0)
Monocytes Relative: 38 %
Neutro Abs: 0.3 10*3/uL — CL (ref 1.7–7.7)
Neutrophils Relative %: 14 %
Platelet Count: 65 10*3/uL — ABNORMAL LOW (ref 150–400)
RBC: 2.7 MIL/uL — ABNORMAL LOW (ref 3.87–5.11)
RDW: 16 % — ABNORMAL HIGH (ref 11.5–15.5)
WBC Count: 1.8 10*3/uL — ABNORMAL LOW (ref 4.0–10.5)
nRBC: 0 % (ref 0.0–0.2)

## 2020-09-13 MED ORDER — DARBEPOETIN ALFA 150 MCG/0.3ML IJ SOSY
150.0000 ug | PREFILLED_SYRINGE | Freq: Once | INTRAMUSCULAR | Status: AC
  Administered 2020-09-13: 150 ug via SUBCUTANEOUS
  Filled 2020-09-13: qty 0.3

## 2020-09-13 NOTE — Patient Instructions (Signed)
Darbepoetin Alfa injection What is this medicine? DARBEPOETIN ALFA (dar be POE e tin AL fa) helps your body make more red blood cells. It is used to treat anemia caused by chronic kidney failure and chemotherapy. This medicine may be used for other purposes; ask your health care provider or pharmacist if you have questions. COMMON BRAND NAME(S): Aranesp What should I tell my health care provider before I take this medicine? They need to know if you have any of these conditions:  blood clotting disorders or history of blood clots  cancer patient not on chemotherapy  cystic fibrosis  heart disease, such as angina, heart failure, or a history of a heart attack  hemoglobin level of 12 g/dL or greater  high blood pressure  low levels of folate, iron, or vitamin B12  seizures  an unusual or allergic reaction to darbepoetin, erythropoietin, albumin, hamster proteins, latex, other medicines, foods, dyes, or preservatives  pregnant or trying to get pregnant  breast-feeding How should I use this medicine? This medicine is for injection into a vein or under the skin. It is usually given by a health care professional in a hospital or clinic setting. If you get this medicine at home, you will be taught how to prepare and give this medicine. Use exactly as directed. Take your medicine at regular intervals. Do not take your medicine more often than directed. It is important that you put your used needles and syringes in a special sharps container. Do not put them in a trash can. If you do not have a sharps container, call your pharmacist or healthcare provider to get one. A special MedGuide will be given to you by the pharmacist with each prescription and refill. Be sure to read this information carefully each time. Talk to your pediatrician regarding the use of this medicine in children. While this medicine may be used in children as young as 1 month of age for selected conditions, precautions do  apply. Overdosage: If you think you have taken too much of this medicine contact a poison control center or emergency room at once. NOTE: This medicine is only for you. Do not share this medicine with others. What if I miss a dose? If you miss a dose, take it as soon as you can. If it is almost time for your next dose, take only that dose. Do not take double or extra doses. What may interact with this medicine? Do not take this medicine with any of the following medications:  epoetin alfa This list may not describe all possible interactions. Give your health care provider a list of all the medicines, herbs, non-prescription drugs, or dietary supplements you use. Also tell them if you smoke, drink alcohol, or use illegal drugs. Some items may interact with your medicine. What should I watch for while using this medicine? Your condition will be monitored carefully while you are receiving this medicine. You may need blood work done while you are taking this medicine. This medicine may cause a decrease in vitamin B6. You should make sure that you get enough vitamin B6 while you are taking this medicine. Discuss the foods you eat and the vitamins you take with your health care professional. What side effects may I notice from receiving this medicine? Side effects that you should report to your doctor or health care professional as soon as possible:  allergic reactions like skin rash, itching or hives, swelling of the face, lips, or tongue  breathing problems  changes in   vision  chest pain  confusion, trouble speaking or understanding  feeling faint or lightheaded, falls  high blood pressure  muscle aches or pains  pain, swelling, warmth in the leg  rapid weight gain  severe headaches  sudden numbness or weakness of the face, arm or leg  trouble walking, dizziness, loss of balance or coordination  seizures (convulsions)  swelling of the ankles, feet, hands  unusually weak or  tired Side effects that usually do not require medical attention (report to your doctor or health care professional if they continue or are bothersome):  diarrhea  fever, chills (flu-like symptoms)  headaches  nausea, vomiting  redness, stinging, or swelling at site where injected This list may not describe all possible side effects. Call your doctor for medical advice about side effects. You may report side effects to FDA at 1-800-FDA-1088. Where should I keep my medicine? Keep out of the reach of children. Store in a refrigerator between 2 and 8 degrees C (36 and 46 degrees F). Do not freeze. Do not shake. Throw away any unused portion if using a single-dose vial. Throw away any unused medicine after the expiration date. NOTE: This sheet is a summary. It may not cover all possible information. If you have questions about this medicine, talk to your doctor, pharmacist, or health care provider.  2021 Elsevier/Gold Standard (2017-08-06 16:44:20)  

## 2020-09-15 LAB — TYPE AND SCREEN
ABO/RH(D): O POS
Antibody Screen: POSITIVE
DAT, IgG: POSITIVE

## 2020-10-11 ENCOUNTER — Inpatient Hospital Stay: Payer: Medicare Other

## 2020-10-11 ENCOUNTER — Other Ambulatory Visit: Payer: Self-pay

## 2020-10-11 ENCOUNTER — Inpatient Hospital Stay: Payer: Medicare Other | Attending: Hematology

## 2020-10-11 ENCOUNTER — Other Ambulatory Visit: Payer: Self-pay | Admitting: *Deleted

## 2020-10-11 VITALS — BP 145/60 | HR 80 | Resp 18

## 2020-10-11 DIAGNOSIS — D469 Myelodysplastic syndrome, unspecified: Secondary | ICD-10-CM | POA: Diagnosis not present

## 2020-10-11 DIAGNOSIS — Z79899 Other long term (current) drug therapy: Secondary | ICD-10-CM | POA: Diagnosis not present

## 2020-10-11 DIAGNOSIS — D649 Anemia, unspecified: Secondary | ICD-10-CM

## 2020-10-11 LAB — CBC WITH DIFFERENTIAL (CANCER CENTER ONLY)
Abs Immature Granulocytes: 0.04 10*3/uL (ref 0.00–0.07)
Basophils Absolute: 0 10*3/uL (ref 0.0–0.1)
Basophils Relative: 0 %
Eosinophils Absolute: 0 10*3/uL (ref 0.0–0.5)
Eosinophils Relative: 0 %
HCT: 31 % — ABNORMAL LOW (ref 36.0–46.0)
Hemoglobin: 9.9 g/dL — ABNORMAL LOW (ref 12.0–15.0)
Immature Granulocytes: 1 %
Lymphocytes Relative: 36 %
Lymphs Abs: 1 10*3/uL (ref 0.7–4.0)
MCH: 34 pg (ref 26.0–34.0)
MCHC: 31.9 g/dL (ref 30.0–36.0)
MCV: 106.5 fL — ABNORMAL HIGH (ref 80.0–100.0)
Monocytes Absolute: 1.2 10*3/uL — ABNORMAL HIGH (ref 0.1–1.0)
Monocytes Relative: 41 %
Neutro Abs: 0.6 10*3/uL — ABNORMAL LOW (ref 1.7–7.7)
Neutrophils Relative %: 22 %
Platelet Count: 65 10*3/uL — ABNORMAL LOW (ref 150–400)
RBC: 2.91 MIL/uL — ABNORMAL LOW (ref 3.87–5.11)
RDW: 15.9 % — ABNORMAL HIGH (ref 11.5–15.5)
WBC Count: 2.9 10*3/uL — ABNORMAL LOW (ref 4.0–10.5)
nRBC: 0 % (ref 0.0–0.2)

## 2020-10-11 LAB — SAMPLE TO BLOOD BANK

## 2020-10-11 MED ORDER — DARBEPOETIN ALFA 150 MCG/0.3ML IJ SOSY
150.0000 ug | PREFILLED_SYRINGE | Freq: Once | INTRAMUSCULAR | Status: AC
  Administered 2020-10-11: 150 ug via SUBCUTANEOUS
  Filled 2020-10-11: qty 0.3

## 2020-10-11 NOTE — Patient Instructions (Signed)
Darbepoetin Alfa injection What is this medicine? DARBEPOETIN ALFA (dar be POE e tin AL fa) helps your body make more red blood cells. It is used to treat anemia caused by chronic kidney failure and chemotherapy. This medicine may be used for other purposes; ask your health care provider or pharmacist if you have questions. COMMON BRAND NAME(S): Aranesp What should I tell my health care provider before I take this medicine? They need to know if you have any of these conditions:  blood clotting disorders or history of blood clots  cancer patient not on chemotherapy  cystic fibrosis  heart disease, such as angina, heart failure, or a history of a heart attack  hemoglobin level of 12 g/dL or greater  high blood pressure  low levels of folate, iron, or vitamin B12  seizures  an unusual or allergic reaction to darbepoetin, erythropoietin, albumin, hamster proteins, latex, other medicines, foods, dyes, or preservatives  pregnant or trying to get pregnant  breast-feeding How should I use this medicine? This medicine is for injection into a vein or under the skin. It is usually given by a health care professional in a hospital or clinic setting. If you get this medicine at home, you will be taught how to prepare and give this medicine. Use exactly as directed. Take your medicine at regular intervals. Do not take your medicine more often than directed. It is important that you put your used needles and syringes in a special sharps container. Do not put them in a trash can. If you do not have a sharps container, call your pharmacist or healthcare provider to get one. A special MedGuide will be given to you by the pharmacist with each prescription and refill. Be sure to read this information carefully each time. Talk to your pediatrician regarding the use of this medicine in children. While this medicine may be used in children as young as 1 month of age for selected conditions, precautions do  apply. Overdosage: If you think you have taken too much of this medicine contact a poison control center or emergency room at once. NOTE: This medicine is only for you. Do not share this medicine with others. What if I miss a dose? If you miss a dose, take it as soon as you can. If it is almost time for your next dose, take only that dose. Do not take double or extra doses. What may interact with this medicine? Do not take this medicine with any of the following medications:  epoetin alfa This list may not describe all possible interactions. Give your health care provider a list of all the medicines, herbs, non-prescription drugs, or dietary supplements you use. Also tell them if you smoke, drink alcohol, or use illegal drugs. Some items may interact with your medicine. What should I watch for while using this medicine? Your condition will be monitored carefully while you are receiving this medicine. You may need blood work done while you are taking this medicine. This medicine may cause a decrease in vitamin B6. You should make sure that you get enough vitamin B6 while you are taking this medicine. Discuss the foods you eat and the vitamins you take with your health care professional. What side effects may I notice from receiving this medicine? Side effects that you should report to your doctor or health care professional as soon as possible:  allergic reactions like skin rash, itching or hives, swelling of the face, lips, or tongue  breathing problems  changes in   vision  chest pain  confusion, trouble speaking or understanding  feeling faint or lightheaded, falls  high blood pressure  muscle aches or pains  pain, swelling, warmth in the leg  rapid weight gain  severe headaches  sudden numbness or weakness of the face, arm or leg  trouble walking, dizziness, loss of balance or coordination  seizures (convulsions)  swelling of the ankles, feet, hands  unusually weak or  tired Side effects that usually do not require medical attention (report to your doctor or health care professional if they continue or are bothersome):  diarrhea  fever, chills (flu-like symptoms)  headaches  nausea, vomiting  redness, stinging, or swelling at site where injected This list may not describe all possible side effects. Call your doctor for medical advice about side effects. You may report side effects to FDA at 1-800-FDA-1088. Where should I keep my medicine? Keep out of the reach of children. Store in a refrigerator between 2 and 8 degrees C (36 and 46 degrees F). Do not freeze. Do not shake. Throw away any unused portion if using a single-dose vial. Throw away any unused medicine after the expiration date. NOTE: This sheet is a summary. It may not cover all possible information. If you have questions about this medicine, talk to your doctor, pharmacist, or health care provider.  2021 Elsevier/Gold Standard (2017-08-06 16:44:20)  

## 2020-10-16 DIAGNOSIS — Z1159 Encounter for screening for other viral diseases: Secondary | ICD-10-CM | POA: Diagnosis not present

## 2020-10-16 DIAGNOSIS — Z20828 Contact with and (suspected) exposure to other viral communicable diseases: Secondary | ICD-10-CM | POA: Diagnosis not present

## 2020-10-23 DIAGNOSIS — Z1159 Encounter for screening for other viral diseases: Secondary | ICD-10-CM | POA: Diagnosis not present

## 2020-10-23 DIAGNOSIS — Z20828 Contact with and (suspected) exposure to other viral communicable diseases: Secondary | ICD-10-CM | POA: Diagnosis not present

## 2020-10-30 DIAGNOSIS — Z20828 Contact with and (suspected) exposure to other viral communicable diseases: Secondary | ICD-10-CM | POA: Diagnosis not present

## 2020-10-30 DIAGNOSIS — Z1159 Encounter for screening for other viral diseases: Secondary | ICD-10-CM | POA: Diagnosis not present

## 2020-11-06 DIAGNOSIS — Z1159 Encounter for screening for other viral diseases: Secondary | ICD-10-CM | POA: Diagnosis not present

## 2020-11-06 DIAGNOSIS — Z20828 Contact with and (suspected) exposure to other viral communicable diseases: Secondary | ICD-10-CM | POA: Diagnosis not present

## 2020-11-13 DIAGNOSIS — Z20828 Contact with and (suspected) exposure to other viral communicable diseases: Secondary | ICD-10-CM | POA: Diagnosis not present

## 2020-11-13 DIAGNOSIS — Z1159 Encounter for screening for other viral diseases: Secondary | ICD-10-CM | POA: Diagnosis not present

## 2020-11-14 ENCOUNTER — Other Ambulatory Visit: Payer: Self-pay

## 2020-11-14 DIAGNOSIS — D469 Myelodysplastic syndrome, unspecified: Secondary | ICD-10-CM

## 2020-11-14 NOTE — Progress Notes (Incomplete)
HEMATOLOGY/ONCOLOGY CLINIC NOTE  Date of Service: 11/14/2020  Patient Care Team: Burnard Bunting, MD as PCP - General (Internal Medicine)  REFERRING PHYSICIAN: Burnard Bunting, MD  CHIEF COMPLAINTS/PURPOSE OF CONSULTATION:  Pancytopenia  HISTORY OF PRESENTING ILLNESS:  Alison Bridges is a wonderful 87 y.o. female who has been referred to [place] by Burnard Bunting, MD for evaluation and management of Pancytopenia. The pt reports that she is doing well overall.   The pt reports she is good. Pt lives alone. Prior to the pandemic she was taking a workout class. About the last 3 months pt has had fatigue, loss of appetite, and weight loss. Last October pt had fatigue, SOB, and was spitting up blood. She has acid reflux and had esophagus dilated twice. A week ago pt was in the hospital due to infection in her right ankle. Her ankle started swelling and turning red. The ankle was not painful but did have burning and tightness. She was put on antibiotics and iron.  Pt uses partial dentures and does not use denture cream. She has been having a burning and hot feeling in feet and face that comes and goes. Her thyroid medication has been stable and she takes the pills several hours before eating with just water. Pt has loss about 10 pounds due to not having appetite. Recently she has had spontaneous bruising. She has had both doses of COVID19 vaccine. Pt had been taking 50mg  of zync for 4-6weeks prior to hospitalization.   Of note prior to the patient's visit today, pt has had DG Ankle Complete Right ([id number]) completed on 12/08/19 with results revealing "Soft tissue swelling without acute osseous finding." Pt has had Lower Venous DVT Study completed on 12/09/19 with results revealing "RIGHT: There is no evidence of deep vein thrombosis in the lower extremity. No cystic structure found in the popliteal fossa. LEFT: No evidence of common femoral vein obstruction." Pt has had CT Abdomen Pelvis W IV  Contrast completed on 10/07/19 with results revealing, " 1. No acute finding 2. Areas of enhancement in the right bladder worrisome for neoplasm 3. Cholecystectomy 4. Bilateral renal cortical cysts and nonobstructive left renal calculi. 5. Aortoiliac atherosclerosis without aneurysm. 6. Hysterectomy 7. Diverticulosis without diverticulitis. 8. Lumbar DDD and DJD with degenerative anterolisthesis of L5 on S1"  Most recent lab results (12/10/19) of CBC is as follows: all values are WNL except for WBC at 2.9K, RBC at 2.35, Hemoglobin at 8.2, HCT at 25.1, MCV at 106.8, MCH at 34.9, Platelets at 66K, Neutro Abs at 0.6K, Monocytes Absolute at 1.4K, Glucose at 105, Calcium at 8.4, GFR, Calc Non Af Amer at 54 12/08/19 of Lactic Acid, Venous at 1.3 12/08/19 of CRP at 0.8: WNL 12/08/19 of Reticulocytes is as follows: all values are WNL except for RC at 2.52, Immature Retic Fract at 16.9 12/08/19 of Ferritin at 167: WNL 12/08/19 of Iron and TIBC is as follows: all values are WNL except for Iron at 27, Saturation Ratios at 10 12/08/19 of Folate at 52.3: WNL 12/08/19 of Vitamin B12 at 931 12/08/19 of Sed Rate at 60  On review of systems, pt reports fatigue, lack of appetite, burning/hot sensation in face and feet, weight loss  and denies lumps/bumps, skin rashes, bone pain, fever, chills, night sweats, changes in bowl habits and urinary habits, nose bleeds, gum bleeds, blood in urine/stool, lightheaded, dizzy, changes in breathing, back pain, constipation and any other symptoms.   INTERVAL HISTORY:   Alison Bridges is a  wonderful 87 y.o. female who is here for evaluation and management of Pancytopenia - likely from MDS +/- MPN. The patient's last visit with [place] was on 08/16/2020. The pt reports that he is doing well overall.  The pt reports ***  Lab results today 11/15/2020 of CBC w/diff and CMP is as follows: all values are WNL except for ***  On review of systems, pt reports *** and denies *** and any  other symptoms.  MEDICAL HISTORY:  Past Medical History:  Diagnosis Date  . Depression   . Hypothyroidism   . Osteoporosis    SURGICAL HISTORY: Past Surgical History:  Procedure Laterality Date  . ABDOMINAL HYSTERECTOMY    . APPENDECTOMY  1998  . CHOLECYSTECTOMY  1998   SOCIAL HISTORY: Social History   Socioeconomic History  . Marital status: Widowed    Spouse name: Not on file  . Number of children: Not on file  . Years of education: Not on file  . Highest education level: Not on file  Occupational History  . Occupation: Oncologist    Comment: Retired  Tobacco Use  . Smoking status: Never Smoker  . Smokeless tobacco: Never Used  Substance and Sexual Activity  . Alcohol use: No  . Drug use: No  . Sexual activity: Not on file  Other Topics Concern  . Not on file  Social History Narrative   She is tried to stay active --  2 days a week she does senior aerobics.  She also enjoys going for walks, but no standard routine.      She has 3 children and 2 grandchildren.   Social Determinants of Health   Financial Resource Strain: Not on file  Food Insecurity: No Food Insecurity  . Worried About Charity fundraiser in the Last Year: Never true  . Ran Out of Food in the Last Year: Never true  Transportation Needs: No Transportation Needs  . Lack of Transportation (Medical): No  . Lack of Transportation (Non-Medical): No  Physical Activity: Not on file  Stress: Not on file  Social Connections: Not on file  Intimate Partner Violence: Not on file     FAMILY HISTORY: Family History  Problem Relation Age of Onset  . Heart attack Mother 55  . Pancreatic cancer Father   . Parkinson's disease Sister   . Diabetes Brother        Multiple complications  . Heart disease Brother        Not sure what happened  . Dementia Brother      ALLERGIES:   has No Known Allergies.   MEDICATIONS:  Current Outpatient Medications  Medication Sig Dispense Refill  .  Ascorbic Acid (VITAMIN C PO) Take 1 tablet by mouth daily.    . cholecalciferol (VITAMIN D) 1000 UNITS tablet Take 1,000 Units by mouth daily.    Marland Kitchen levothyroxine (SYNTHROID, LEVOTHROID) 125 MCG tablet Take 125 mcg by mouth daily before breakfast.    . Multiple Vitamin (MULTIVITAMIN WITH MINERALS) TABS tablet Take 1 tablet by mouth daily.    . pantoprazole (PROTONIX) 40 MG tablet Take 40 mg by mouth daily as needed (acid reflux).     . polyethylene glycol (MIRALAX) 17 g packet Take 17 g by mouth daily as needed for mild constipation. (Patient not taking: Reported on 05/17/2020)    . Sennosides (SENOKOT PO) Take 1 tablet by mouth at bedtime as needed.    . sertraline (ZOLOFT) 100 MG tablet Take 100 mg by mouth daily.  No current facility-administered medications for this visit.     REVIEW OF SYSTEMS:   10 Point review of Systems was done is negative except as noted above.  PHYSICAL EXAMINATION: ECOG PERFORMANCE STATUS: 2 - Symptomatic, <50% confined to bed  There were no vitals filed for this visit. There were no vitals filed for this visit. There is no height or weight on file to calculate BMI.  *** GENERAL:alert, in no acute distress and comfortable SKIN: no acute rashes, no significant lesions EYES: conjunctiva are pink and non-injected, sclera anicteric OROPHARYNX: MMM, no exudates, no oropharyngeal erythema or ulceration NECK: supple, no JVD LYMPH:  no palpable lymphadenopathy in the cervical, axillary or inguinal regions LUNGS: clear to auscultation b/l with normal respiratory effort HEART: regular rate & rhythm ABDOMEN:  normoactive bowel sounds , non tender, not distended. Extremity: no pedal edema PSYCH: alert & oriented x 3 with fluent speech NEURO: no focal motor/sensory deficits  LABORATORY DATA:  I have reviewed the data as listed  CBC Latest Ref Rng & Units 10/11/2020 09/13/2020 08/16/2020  WBC 4.0 - 10.5 K/uL 2.9(L) 1.8(L) 2.8(L)  Hemoglobin 12.0 - 15.0 g/dL  9.9(L) 9.2(L) 9.0(L)  Hematocrit 36.0 - 46.0 % 31.0(L) 29.2(L) 28.6(L)  Platelets 150 - 400 K/uL 65(L) 65(L) 73(L)    CMP Latest Ref Rng & Units 08/16/2020 07/12/2020 06/14/2020  Glucose 70 - 99 mg/dL 79 70 83  BUN 8 - 23 mg/dL 18 21 24(H)  Creatinine 0.44 - 1.00 mg/dL 1.26(H) 1.28(H) 1.19(H)  Sodium 135 - 145 mmol/L 136 137 138  Potassium 3.5 - 5.1 mmol/L 4.4 4.0 4.2  Chloride 98 - 111 mmol/L 106 105 105  CO2 22 - 32 mmol/L 24 26 25   Calcium 8.9 - 10.3 mg/dL 9.0 9.3 8.8(L)  Total Protein 6.5 - 8.1 g/dL 7.6 7.5 7.4  Total Bilirubin 0.3 - 1.2 mg/dL 0.5 0.5 0.6  Alkaline Phos 38 - 126 U/L 95 93 101  AST 15 - 41 U/L 20 21 22   ALT 0 - 44 U/L 18 18 22    . Lab Results  Component Value Date   IRON 90 08/16/2020   TIBC 211 (L) 08/16/2020   IRONPCTSAT 43 08/16/2020   (Iron and TIBC)  Lab Results  Component Value Date   FERRITIN 1,482 (H) 08/16/2020     01/10/2020 Cytogenetics [id number]):    01/10/2020 FISH Panel ([id number]):   01/10/2020 BM Bx Report ([id number]):    12/16/2019 JAK2, MPL, CALR Panel Report:    RADIOGRAPHIC STUDIES: I have personally reviewed the radiological images as listed and agreed with the findings in the report. No results found.   ASSESSMENT & PLAN:  KAELAN EMAMI is a 87 y.o. female with:   1. Pancytopenia - likely from MDS +/- MPN -01/10/2020 BM Bx Surgical Pathology Report ([id number]) revealed "BONE MARROW, ASPIRATE, CLOT, CORE: -Hypercellular marrow with myeloid hyperplasia and megakaryocytic atypia  PERIPHERAL BLOOD: - Pancytopenia". -12/16/19 JAK2 (including V617 and Exon 12), MPL, and CALR-Next Generation Sequencing shows "Tier II: Variants of Potential Clinical Significance - SRSF2 p.Pro95His" 2. Bladder lesion -- patient following with urology  PLAN: -Discussed pt labwork today, 11/15/2020; ***    -Continue monthly Aranesp at the lowest possible dose for goal Hgb ? 10. -Continue daily B-complex vitamin -Will see back  ***  FOLLOW UP: ***   The total time spent in the appt was *** minutes and more than 50% was on counseling and direct patient cares.  All of the patient's questions were answered with  apparent satisfaction. The patient knows to call the clinic with any problems, questions or concerns.   Sullivan Lone MD [place] AAHIVMS [facility] Hematology/Oncology Physician [facility]  (Office):       [phone] (Work cell):  [phone] (Fax):           [phone]  11/14/2020 8:13 PM  I, Reinaldo Raddle, am acting as scribe for Dr. Sullivan Lone, MD.

## 2020-11-15 ENCOUNTER — Inpatient Hospital Stay: Payer: Medicare Other | Admitting: Hematology

## 2020-11-15 ENCOUNTER — Other Ambulatory Visit: Payer: Medicare Other

## 2020-11-15 ENCOUNTER — Telehealth: Payer: Self-pay | Admitting: Hematology

## 2020-11-15 ENCOUNTER — Inpatient Hospital Stay: Payer: Medicare Other

## 2020-11-15 ENCOUNTER — Ambulatory Visit: Payer: Medicare Other

## 2020-11-15 NOTE — Telephone Encounter (Signed)
R/s appts per pt's request 4/13 sch msg. Pt aware.

## 2020-11-20 ENCOUNTER — Inpatient Hospital Stay: Payer: Medicare Other

## 2020-11-20 ENCOUNTER — Inpatient Hospital Stay: Payer: Medicare Other | Attending: Hematology

## 2020-11-20 ENCOUNTER — Other Ambulatory Visit: Payer: Self-pay

## 2020-11-20 VITALS — BP 115/56 | HR 85 | Temp 97.7°F | Resp 17

## 2020-11-20 DIAGNOSIS — Z79899 Other long term (current) drug therapy: Secondary | ICD-10-CM | POA: Insufficient documentation

## 2020-11-20 DIAGNOSIS — Z1159 Encounter for screening for other viral diseases: Secondary | ICD-10-CM | POA: Diagnosis not present

## 2020-11-20 DIAGNOSIS — Z20828 Contact with and (suspected) exposure to other viral communicable diseases: Secondary | ICD-10-CM | POA: Diagnosis not present

## 2020-11-20 DIAGNOSIS — D469 Myelodysplastic syndrome, unspecified: Secondary | ICD-10-CM

## 2020-11-20 DIAGNOSIS — D649 Anemia, unspecified: Secondary | ICD-10-CM

## 2020-11-20 LAB — SAMPLE TO BLOOD BANK

## 2020-11-20 LAB — CBC WITH DIFFERENTIAL (CANCER CENTER ONLY)
Abs Immature Granulocytes: 0.08 10*3/uL — ABNORMAL HIGH (ref 0.00–0.07)
Basophils Absolute: 0 10*3/uL (ref 0.0–0.1)
Basophils Relative: 0 %
Eosinophils Absolute: 0 10*3/uL (ref 0.0–0.5)
Eosinophils Relative: 0 %
HCT: 29.4 % — ABNORMAL LOW (ref 36.0–46.0)
Hemoglobin: 9.3 g/dL — ABNORMAL LOW (ref 12.0–15.0)
Immature Granulocytes: 3 %
Lymphocytes Relative: 33 %
Lymphs Abs: 0.9 10*3/uL (ref 0.7–4.0)
MCH: 33.3 pg (ref 26.0–34.0)
MCHC: 31.6 g/dL (ref 30.0–36.0)
MCV: 105.4 fL — ABNORMAL HIGH (ref 80.0–100.0)
Monocytes Absolute: 1 10*3/uL (ref 0.1–1.0)
Monocytes Relative: 35 %
Neutro Abs: 0.8 10*3/uL — ABNORMAL LOW (ref 1.7–7.7)
Neutrophils Relative %: 29 %
Platelet Count: 70 10*3/uL — ABNORMAL LOW (ref 150–400)
RBC: 2.79 MIL/uL — ABNORMAL LOW (ref 3.87–5.11)
RDW: 15.1 % (ref 11.5–15.5)
WBC Count: 2.9 10*3/uL — ABNORMAL LOW (ref 4.0–10.5)
nRBC: 0 % (ref 0.0–0.2)

## 2020-11-20 LAB — CMP (CANCER CENTER ONLY)
ALT: 20 U/L (ref 0–44)
AST: 22 U/L (ref 15–41)
Albumin: 3.9 g/dL (ref 3.5–5.0)
Alkaline Phosphatase: 94 U/L (ref 38–126)
Anion gap: 11 (ref 5–15)
BUN: 21 mg/dL (ref 8–23)
CO2: 24 mmol/L (ref 22–32)
Calcium: 8.7 mg/dL — ABNORMAL LOW (ref 8.9–10.3)
Chloride: 104 mmol/L (ref 98–111)
Creatinine: 1.37 mg/dL — ABNORMAL HIGH (ref 0.44–1.00)
GFR, Estimated: 37 mL/min — ABNORMAL LOW (ref >60)
Glucose, Bld: 86 mg/dL (ref 70–99)
Potassium: 4.4 mmol/L (ref 3.5–5.1)
Sodium: 139 mmol/L (ref 135–145)
Total Bilirubin: 0.4 mg/dL (ref 0.3–1.2)
Total Protein: 7.4 g/dL (ref 6.5–8.1)

## 2020-11-20 MED ORDER — DARBEPOETIN ALFA 150 MCG/0.3ML IJ SOSY
150.0000 ug | PREFILLED_SYRINGE | Freq: Once | INTRAMUSCULAR | Status: AC
  Administered 2020-11-20: 150 ug via SUBCUTANEOUS
  Filled 2020-11-20: qty 0.3

## 2020-11-20 NOTE — Patient Instructions (Signed)
Darbepoetin Alfa injection What is this medicine? DARBEPOETIN ALFA (dar be POE e tin AL fa) helps your body make more red blood cells. It is used to treat anemia caused by chronic kidney failure and chemotherapy. This medicine may be used for other purposes; ask your health care provider or pharmacist if you have questions. COMMON BRAND NAME(S): Aranesp What should I tell my health care provider before I take this medicine? They need to know if you have any of these conditions:  blood clotting disorders or history of blood clots  cancer patient not on chemotherapy  cystic fibrosis  heart disease, such as angina, heart failure, or a history of a heart attack  hemoglobin level of 12 g/dL or greater  high blood pressure  low levels of folate, iron, or vitamin B12  seizures  an unusual or allergic reaction to darbepoetin, erythropoietin, albumin, hamster proteins, latex, other medicines, foods, dyes, or preservatives  pregnant or trying to get pregnant  breast-feeding How should I use this medicine? This medicine is for injection into a vein or under the skin. It is usually given by a health care professional in a hospital or clinic setting. If you get this medicine at home, you will be taught how to prepare and give this medicine. Use exactly as directed. Take your medicine at regular intervals. Do not take your medicine more often than directed. It is important that you put your used needles and syringes in a special sharps container. Do not put them in a trash can. If you do not have a sharps container, call your pharmacist or healthcare provider to get one. A special MedGuide will be given to you by the pharmacist with each prescription and refill. Be sure to read this information carefully each time. Talk to your pediatrician regarding the use of this medicine in children. While this medicine may be used in children as young as 1 month of age for selected conditions, precautions do  apply. Overdosage: If you think you have taken too much of this medicine contact a poison control center or emergency room at once. NOTE: This medicine is only for you. Do not share this medicine with others. What if I miss a dose? If you miss a dose, take it as soon as you can. If it is almost time for your next dose, take only that dose. Do not take double or extra doses. What may interact with this medicine? Do not take this medicine with any of the following medications:  epoetin alfa This list may not describe all possible interactions. Give your health care provider a list of all the medicines, herbs, non-prescription drugs, or dietary supplements you use. Also tell them if you smoke, drink alcohol, or use illegal drugs. Some items may interact with your medicine. What should I watch for while using this medicine? Your condition will be monitored carefully while you are receiving this medicine. You may need blood work done while you are taking this medicine. This medicine may cause a decrease in vitamin B6. You should make sure that you get enough vitamin B6 while you are taking this medicine. Discuss the foods you eat and the vitamins you take with your health care professional. What side effects may I notice from receiving this medicine? Side effects that you should report to your doctor or health care professional as soon as possible:  allergic reactions like skin rash, itching or hives, swelling of the face, lips, or tongue  breathing problems  changes in   vision  chest pain  confusion, trouble speaking or understanding  feeling faint or lightheaded, falls  high blood pressure  muscle aches or pains  pain, swelling, warmth in the leg  rapid weight gain  severe headaches  sudden numbness or weakness of the face, arm or leg  trouble walking, dizziness, loss of balance or coordination  seizures (convulsions)  swelling of the ankles, feet, hands  unusually weak or  tired Side effects that usually do not require medical attention (report to your doctor or health care professional if they continue or are bothersome):  diarrhea  fever, chills (flu-like symptoms)  headaches  nausea, vomiting  redness, stinging, or swelling at site where injected This list may not describe all possible side effects. Call your doctor for medical advice about side effects. You may report side effects to FDA at 1-800-FDA-1088. Where should I keep my medicine? Keep out of the reach of children. Store in a refrigerator between 2 and 8 degrees C (36 and 46 degrees F). Do not freeze. Do not shake. Throw away any unused portion if using a single-dose vial. Throw away any unused medicine after the expiration date. NOTE: This sheet is a summary. It may not cover all possible information. If you have questions about this medicine, talk to your doctor, pharmacist, or health care provider.  2021 Elsevier/Gold Standard (2017-08-06 16:44:20)  

## 2020-11-27 DIAGNOSIS — Z1159 Encounter for screening for other viral diseases: Secondary | ICD-10-CM | POA: Diagnosis not present

## 2020-11-27 DIAGNOSIS — Z20828 Contact with and (suspected) exposure to other viral communicable diseases: Secondary | ICD-10-CM | POA: Diagnosis not present

## 2020-11-30 DIAGNOSIS — H26493 Other secondary cataract, bilateral: Secondary | ICD-10-CM | POA: Diagnosis not present

## 2020-11-30 DIAGNOSIS — H52203 Unspecified astigmatism, bilateral: Secondary | ICD-10-CM | POA: Diagnosis not present

## 2020-11-30 DIAGNOSIS — Z961 Presence of intraocular lens: Secondary | ICD-10-CM | POA: Diagnosis not present

## 2020-12-04 DIAGNOSIS — Z1159 Encounter for screening for other viral diseases: Secondary | ICD-10-CM | POA: Diagnosis not present

## 2020-12-04 DIAGNOSIS — Z20828 Contact with and (suspected) exposure to other viral communicable diseases: Secondary | ICD-10-CM | POA: Diagnosis not present

## 2020-12-07 DIAGNOSIS — H26492 Other secondary cataract, left eye: Secondary | ICD-10-CM | POA: Diagnosis not present

## 2020-12-11 DIAGNOSIS — Z20828 Contact with and (suspected) exposure to other viral communicable diseases: Secondary | ICD-10-CM | POA: Diagnosis not present

## 2020-12-11 DIAGNOSIS — Z1159 Encounter for screening for other viral diseases: Secondary | ICD-10-CM | POA: Diagnosis not present

## 2020-12-12 NOTE — Progress Notes (Signed)
HEMATOLOGY/ONCOLOGY CLINIC NOTE  Date of Service: 12/13/2020  Patient Care Team: Burnard Bunting, MD as PCP - General (Internal Medicine)  REFERRING PHYSICIAN: Burnard Bunting, MD  CHIEF COMPLAINTS/PURPOSE OF CONSULTATION:  Pancytopenia  HISTORY OF PRESENTING ILLNESS:  Alison Bridges is a wonderful 87 y.o. female who has been referred to [place] by Burnard Bunting, MD for evaluation and management of Pancytopenia. The pt reports that she is doing well overall.   The pt reports she is good. Pt lives alone. Prior to the pandemic she was taking a workout class. About the last 3 months pt has had fatigue, loss of appetite, and weight loss. Last October pt had fatigue, SOB, and was spitting up blood. She has acid reflux and had esophagus dilated twice. A week ago pt was in the hospital due to infection in her right ankle. Her ankle started swelling and turning red. The ankle was not painful but did have burning and tightness. She was put on antibiotics and iron.  Pt uses partial dentures and does not use denture cream. She has been having a burning and hot feeling in feet and face that comes and goes. Her thyroid medication has been stable and she takes the pills several hours before eating with just water. Pt has loss about 10 pounds due to not having appetite. Recently she has had spontaneous bruising. She has had both doses of COVID19 vaccine. Pt had been taking 50mg  of zync for 4-6weeks prior to hospitalization.   Of note prior to the patient's visit today, pt has had DG Ankle Complete Right ([id number]) completed on 12/08/19 with results revealing "Soft tissue swelling without acute osseous finding." Pt has had Lower Venous DVT Study completed on 12/09/19 with results revealing "RIGHT: There is no evidence of deep vein thrombosis in the lower extremity. No cystic structure found in the popliteal fossa. LEFT: No evidence of common femoral vein obstruction." Pt has had CT Abdomen Pelvis W IV  Contrast completed on 10/07/19 with results revealing, " 1. No acute finding 2. Areas of enhancement in the right bladder worrisome for neoplasm 3. Cholecystectomy 4. Bilateral renal cortical cysts and nonobstructive left renal calculi. 5. Aortoiliac atherosclerosis without aneurysm. 6. Hysterectomy 7. Diverticulosis without diverticulitis. 8. Lumbar DDD and DJD with degenerative anterolisthesis of L5 on S1"  Most recent lab results (12/10/19) of CBC is as follows: all values are WNL except for WBC at 2.9K, RBC at 2.35, Hemoglobin at 8.2, HCT at 25.1, MCV at 106.8, MCH at 34.9, Platelets at 66K, Neutro Abs at 0.6K, Monocytes Absolute at 1.4K, Glucose at 105, Calcium at 8.4, GFR, Calc Non Af Amer at 54 12/08/19 of Lactic Acid, Venous at 1.3 12/08/19 of CRP at 0.8: WNL 12/08/19 of Reticulocytes is as follows: all values are WNL except for RC at 2.52, Immature Retic Fract at 16.9 12/08/19 of Ferritin at 167: WNL 12/08/19 of Iron and TIBC is as follows: all values are WNL except for Iron at 27, Saturation Ratios at 10 12/08/19 of Folate at 52.3: WNL 12/08/19 of Vitamin B12 at 931 12/08/19 of Sed Rate at 60  On review of systems, pt reports fatigue, lack of appetite, burning/hot sensation in face and feet, weight loss  and denies lumps/bumps, skin rashes, bone pain, fever, chills, night sweats, changes in bowl habits and urinary habits, nose bleeds, gum bleeds, blood in urine/stool, lightheaded, dizzy, changes in breathing, back pain, constipation and any other symptoms.   INTERVAL HISTORY:   Alison Bridges is a  wonderful 87 y.o. female who is here for evaluation and management of Pancytopenia - likely from MDS +/- MPN. The patient's last visit with [place] was on 08/16/2020. The pt reports that he is doing well overall.  The pt reports that she has been slightly more fatigued since the last visit, but notes much stability in all of her medical issues. She has been very stable since the last visit. The pt  notes that she is interested in getting her second booster. She notes that where she lives only has the [place] and she wants to continue getting [employer] vaccine.  Lab results today 12/13/2020 of CBC w/diff and CMP is as follows: all values are WNL except for WBC of 2.3K, RBC of 2.86, Hgb of 9.6, Hct of 30.7, MCV of 107.3, RDW of 16.7, Plt of 61K, Neutro Abs of 0.4K, Creatinine of 1.33, GFR est of 38.  On review of systems, pt reports fatigue, weight gain, intermittent constipation and denies fevers, chills, infection issues, abdominal pain, leg swelling, and any other symptoms.  MEDICAL HISTORY:  Past Medical History:  Diagnosis Date  . Depression   . Hypothyroidism   . Osteoporosis    SURGICAL HISTORY: Past Surgical History:  Procedure Laterality Date  . ABDOMINAL HYSTERECTOMY    . APPENDECTOMY  1998  . CHOLECYSTECTOMY  1998   SOCIAL HISTORY: Social History   Socioeconomic History  . Marital status: Widowed    Spouse name: Not on file  . Number of children: Not on file  . Years of education: Not on file  . Highest education level: Not on file  Occupational History  . Occupation: Oncologist    Comment: Retired  Tobacco Use  . Smoking status: Never Smoker  . Smokeless tobacco: Never Used  Substance and Sexual Activity  . Alcohol use: No  . Drug use: No  . Sexual activity: Not on file  Other Topics Concern  . Not on file  Social History Narrative   She is tried to stay active --  2 days a week she does senior aerobics.  She also enjoys going for walks, but no standard routine.      She has 3 children and 2 grandchildren.   Social Determinants of Health   Financial Resource Strain: Not on file  Food Insecurity: No Food Insecurity  . Worried About Charity fundraiser in the Last Year: Never true  . Ran Out of Food in the Last Year: Never true  Transportation Needs: No Transportation Needs  . Lack of Transportation (Medical): No  . Lack of Transportation  (Non-Medical): No  Physical Activity: Not on file  Stress: Not on file  Social Connections: Not on file  Intimate Partner Violence: Not on file     FAMILY HISTORY: Family History  Problem Relation Age of Onset  . Heart attack Mother 65  . Pancreatic cancer Father   . Parkinson's disease Sister   . Diabetes Brother        Multiple complications  . Heart disease Brother        Not sure what happened  . Dementia Brother      ALLERGIES:   has No Known Allergies.   MEDICATIONS:  Current Outpatient Medications  Medication Sig Dispense Refill  . Ascorbic Acid (VITAMIN C PO) Take 1 tablet by mouth daily.    . cholecalciferol (VITAMIN D) 1000 UNITS tablet Take 1,000 Units by mouth daily.    Marland Kitchen levothyroxine (SYNTHROID, LEVOTHROID) 125 MCG tablet Take  125 mcg by mouth daily before breakfast.    . Multiple Vitamin (MULTIVITAMIN WITH MINERALS) TABS tablet Take 1 tablet by mouth daily.    . sertraline (ZOLOFT) 100 MG tablet Take 100 mg by mouth daily.    . pantoprazole (PROTONIX) 40 MG tablet Take 40 mg by mouth as needed (acid reflux).    . polyethylene glycol (MIRALAX / GLYCOLAX) 17 g packet Take 17 g by mouth daily as needed for mild constipation. (Patient not taking: No sig reported)    . Sennosides (SENOKOT PO) Take 1 tablet by mouth at bedtime as needed. (Patient not taking: Reported on 12/13/2020)     No current facility-administered medications for this visit.   Facility-Administered Medications Ordered in Other Visits  Medication Dose Route Frequency Provider Last Rate Last Admin  . Darbepoetin Alfa (ARANESP) injection 150 mcg  150 mcg Subcutaneous Once [name], MD         REVIEW OF SYSTEMS:   10 Point review of Systems was done is negative except as noted above.  PHYSICAL EXAMINATION: ECOG PERFORMANCE STATUS: 2 - Symptomatic, <50% confined to bed  Vitals:   12/13/20 0940  BP: 130/60  Pulse: 86  Resp: 17  Temp: 97.9 F (36.6 C)  SpO2: 100%   Filed  Weights   12/13/20 0940  Weight: 125 lb 9.6 oz (57 kg)   Body mass index is 19.1 kg/m.  Exam was given in a chair.   GENERAL:alert, in no acute distress and comfortable SKIN: no acute rashes, no significant lesions EYES: conjunctiva are pink and non-injected, sclera anicteric OROPHARYNX: MMM, no exudates, no oropharyngeal erythema or ulceration NECK: supple, no JVD LYMPH:  no palpable lymphadenopathy in the cervical, axillary or inguinal regions LUNGS: clear to auscultation b/l with normal respiratory effort HEART: regular rate & rhythm ABDOMEN:  normoactive bowel sounds , non tender, not distended. No palpable hepatosplenomegaly.  Extremity: no pedal edema PSYCH: alert & oriented x 3 with fluent speech NEURO: no focal motor/sensory deficits  LABORATORY DATA:  I have reviewed the data as listed  CBC Latest Ref Rng & Units 12/13/2020 11/20/2020 10/11/2020  WBC 4.0 - 10.5 K/uL 2.3(L) 2.9(L) 2.9(L)  Hemoglobin 12.0 - 15.0 g/dL 9.6(L) 9.3(L) 9.9(L)  Hematocrit 36.0 - 46.0 % 30.7(L) 29.4(L) 31.0(L)  Platelets 150 - 400 K/uL 61(L) 70(L) 65(L)    CMP Latest Ref Rng & Units 12/13/2020 11/20/2020 08/16/2020  Glucose 70 - 99 mg/dL 73 86 79  BUN 8 - 23 mg/dL 23 21 18   Creatinine 0.44 - 1.00 mg/dL 1.33(H) 1.37(H) 1.26(H)  Sodium 135 - 145 mmol/L 139 139 136  Potassium 3.5 - 5.1 mmol/L 4.6 4.4 4.4  Chloride 98 - 111 mmol/L 106 104 106  CO2 22 - 32 mmol/L 23 24 24   Calcium 8.9 - 10.3 mg/dL 9.1 8.7(L) 9.0  Total Protein 6.5 - 8.1 g/dL 7.2 7.4 7.6  Total Bilirubin 0.3 - 1.2 mg/dL 0.5 0.4 0.5  Alkaline Phos 38 - 126 U/L 84 94 95  AST 15 - 41 U/L 20 22 20   ALT 0 - 44 U/L 14 20 18    . Lab Results  Component Value Date   IRON 90 08/16/2020   TIBC 211 (L) 08/16/2020   IRONPCTSAT 43 08/16/2020   (Iron and TIBC)  Lab Results  Component Value Date   FERRITIN 1,482 (H) 08/16/2020     01/10/2020 Cytogenetics [phone]):    01/10/2020 FISH Panel ([id number]):   01/10/2020 BM Bx  Report ([id number]):  12/16/2019 JAK2, MPL, CALR Panel Report:    RADIOGRAPHIC STUDIES: I have personally reviewed the radiological images as listed and agreed with the findings in the report. No results found.   ASSESSMENT & PLAN:   SHIVAUN AVRIL is a 87 y.o. female with:   1. Pancytopenia - likely from MDS +/- MPN -01/10/2020 BM Bx Surgical Pathology Report ([id number]) revealed "BONE MARROW, ASPIRATE, CLOT, CORE: -Hypercellular marrow with myeloid hyperplasia and megakaryocytic atypia  PERIPHERAL BLOOD: - Pancytopenia". -12/16/19 JAK2 (including V617 and Exon 12), MPL, and CALR-Next Generation Sequencing shows "Tier II: Variants of Potential Clinical Significance - SRSF2 p.Pro95His" 2. Bladder lesion -- patient following with urology  PLAN: -Discussed pt labwork today, 12/13/2020; Hgb remaining stable, neutrophils lower, counts stable, chemistries normal. No need for transfusion today. WBC low but not actively bothering the pt and no issues with infections. Plt low but are not limiting. -Recommended pt receive the second COVID booster shot as recently approved. Advised pt to wait 4-6 months following first booster shot before getting this. -Discussed [place] and pt's eligibility. The pt desires to hold off on this referral. -Advised pt there is no need for G-CSF at this time due no severe infection issues. Also discussed pros and cons of prophylactic antimicrobials..holding for now. -Continue monthly 150 mg Aranesp at the lowest possible dose for goal Hgb ? 10. -Continue daily B-complex vitamin -Will see back 3 months with labs.   FOLLOW UP: Plz schedule next 6 doses of monthly Aranesp injections with labs MD visit in 3 months   The total time spent in the appt was 20 minutes and more than 50% was on counseling and direct patient cares.  All of the patient's questions were answered with apparent satisfaction. The patient knows to call the clinic with any problems,  questions or concerns.   Sullivan Lone MD [place] AAHIVMS [facility] Hematology/Oncology Physician [facility]  (Office):       [phone] (Work cell):  [phone] (Fax):           [phone]  12/13/2020 10:40 AM  I, Reinaldo Raddle, am acting as scribe for Dr. Sullivan Lone, MD.   .I have reviewed the above documentation for accuracy and completeness, and I agree with the above. [name] MD

## 2020-12-13 ENCOUNTER — Other Ambulatory Visit: Payer: Self-pay

## 2020-12-13 ENCOUNTER — Ambulatory Visit: Payer: Medicare Other

## 2020-12-13 ENCOUNTER — Inpatient Hospital Stay: Payer: Medicare Other

## 2020-12-13 ENCOUNTER — Other Ambulatory Visit: Payer: Medicare Other

## 2020-12-13 ENCOUNTER — Inpatient Hospital Stay: Payer: Medicare Other | Attending: Hematology | Admitting: Hematology

## 2020-12-13 VITALS — BP 130/60 | HR 86 | Temp 97.9°F | Resp 17 | Ht 68.0 in | Wt 125.6 lb

## 2020-12-13 DIAGNOSIS — D469 Myelodysplastic syndrome, unspecified: Secondary | ICD-10-CM

## 2020-12-13 DIAGNOSIS — D649 Anemia, unspecified: Secondary | ICD-10-CM

## 2020-12-13 DIAGNOSIS — Z79899 Other long term (current) drug therapy: Secondary | ICD-10-CM | POA: Insufficient documentation

## 2020-12-13 LAB — CMP (CANCER CENTER ONLY)
ALT: 14 U/L (ref 0–44)
AST: 20 U/L (ref 15–41)
Albumin: 3.9 g/dL (ref 3.5–5.0)
Alkaline Phosphatase: 84 U/L (ref 38–126)
Anion gap: 10 (ref 5–15)
BUN: 23 mg/dL (ref 8–23)
CO2: 23 mmol/L (ref 22–32)
Calcium: 9.1 mg/dL (ref 8.9–10.3)
Chloride: 106 mmol/L (ref 98–111)
Creatinine: 1.33 mg/dL — ABNORMAL HIGH (ref 0.44–1.00)
GFR, Estimated: 38 mL/min — ABNORMAL LOW (ref >60)
Glucose, Bld: 73 mg/dL (ref 70–99)
Potassium: 4.6 mmol/L (ref 3.5–5.1)
Sodium: 139 mmol/L (ref 135–145)
Total Bilirubin: 0.5 mg/dL (ref 0.3–1.2)
Total Protein: 7.2 g/dL (ref 6.5–8.1)

## 2020-12-13 LAB — CBC WITH DIFFERENTIAL (CANCER CENTER ONLY)
Abs Immature Granulocytes: 0.02 10*3/uL (ref 0.00–0.07)
Basophils Absolute: 0 10*3/uL (ref 0.0–0.1)
Basophils Relative: 0 %
Eosinophils Absolute: 0 10*3/uL (ref 0.0–0.5)
Eosinophils Relative: 0 %
HCT: 30.7 % — ABNORMAL LOW (ref 36.0–46.0)
Hemoglobin: 9.6 g/dL — ABNORMAL LOW (ref 12.0–15.0)
Immature Granulocytes: 1 %
Lymphocytes Relative: 42 %
Lymphs Abs: 1 10*3/uL (ref 0.7–4.0)
MCH: 33.6 pg (ref 26.0–34.0)
MCHC: 31.3 g/dL (ref 30.0–36.0)
MCV: 107.3 fL — ABNORMAL HIGH (ref 80.0–100.0)
Monocytes Absolute: 0.9 10*3/uL (ref 0.1–1.0)
Monocytes Relative: 39 %
Neutro Abs: 0.4 10*3/uL — CL (ref 1.7–7.7)
Neutrophils Relative %: 18 %
Platelet Count: 61 10*3/uL — ABNORMAL LOW (ref 150–400)
RBC: 2.86 MIL/uL — ABNORMAL LOW (ref 3.87–5.11)
RDW: 16.7 % — ABNORMAL HIGH (ref 11.5–15.5)
WBC Count: 2.3 10*3/uL — ABNORMAL LOW (ref 4.0–10.5)
nRBC: 0 % (ref 0.0–0.2)

## 2020-12-13 LAB — SAMPLE TO BLOOD BANK

## 2020-12-13 MED ORDER — DARBEPOETIN ALFA 200 MCG/0.4ML IJ SOSY
PREFILLED_SYRINGE | INTRAMUSCULAR | Status: AC
  Filled 2020-12-13: qty 0.4

## 2020-12-13 MED ORDER — DARBEPOETIN ALFA 150 MCG/0.3ML IJ SOSY
150.0000 ug | PREFILLED_SYRINGE | Freq: Once | INTRAMUSCULAR | Status: AC
  Administered 2020-12-13: 150 ug via SUBCUTANEOUS
  Filled 2020-12-13: qty 0.3

## 2020-12-13 NOTE — Patient Instructions (Signed)
Darbepoetin Alfa injection What is this medicine? DARBEPOETIN ALFA (dar be POE e tin AL fa) helps your body make more red blood cells. It is used to treat anemia caused by chronic kidney failure and chemotherapy. This medicine may be used for other purposes; ask your health care provider or pharmacist if you have questions. COMMON BRAND NAME(S): Aranesp What should I tell my health care provider before I take this medicine? They need to know if you have any of these conditions:  blood clotting disorders or history of blood clots  cancer patient not on chemotherapy  cystic fibrosis  heart disease, such as angina, heart failure, or a history of a heart attack  hemoglobin level of 12 g/dL or greater  high blood pressure  low levels of folate, iron, or vitamin B12  seizures  an unusual or allergic reaction to darbepoetin, erythropoietin, albumin, hamster proteins, latex, other medicines, foods, dyes, or preservatives  pregnant or trying to get pregnant  breast-feeding How should I use this medicine? This medicine is for injection into a vein or under the skin. It is usually given by a health care professional in a hospital or clinic setting. If you get this medicine at home, you will be taught how to prepare and give this medicine. Use exactly as directed. Take your medicine at regular intervals. Do not take your medicine more often than directed. It is important that you put your used needles and syringes in a special sharps container. Do not put them in a trash can. If you do not have a sharps container, call your pharmacist or healthcare provider to get one. A special MedGuide will be given to you by the pharmacist with each prescription and refill. Be sure to read this information carefully each time. Talk to your pediatrician regarding the use of this medicine in children. While this medicine may be used in children as young as 1 month of age for selected conditions, precautions do  apply. Overdosage: If you think you have taken too much of this medicine contact a poison control center or emergency room at once. NOTE: This medicine is only for you. Do not share this medicine with others. What if I miss a dose? If you miss a dose, take it as soon as you can. If it is almost time for your next dose, take only that dose. Do not take double or extra doses. What may interact with this medicine? Do not take this medicine with any of the following medications:  epoetin alfa This list may not describe all possible interactions. Give your health care provider a list of all the medicines, herbs, non-prescription drugs, or dietary supplements you use. Also tell them if you smoke, drink alcohol, or use illegal drugs. Some items may interact with your medicine. What should I watch for while using this medicine? Your condition will be monitored carefully while you are receiving this medicine. You may need blood work done while you are taking this medicine. This medicine may cause a decrease in vitamin B6. You should make sure that you get enough vitamin B6 while you are taking this medicine. Discuss the foods you eat and the vitamins you take with your health care professional. What side effects may I notice from receiving this medicine? Side effects that you should report to your doctor or health care professional as soon as possible:  allergic reactions like skin rash, itching or hives, swelling of the face, lips, or tongue  breathing problems  changes in   vision  chest pain  confusion, trouble speaking or understanding  feeling faint or lightheaded, falls  high blood pressure  muscle aches or pains  pain, swelling, warmth in the leg  rapid weight gain  severe headaches  sudden numbness or weakness of the face, arm or leg  trouble walking, dizziness, loss of balance or coordination  seizures (convulsions)  swelling of the ankles, feet, hands  unusually weak or  tired Side effects that usually do not require medical attention (report to your doctor or health care professional if they continue or are bothersome):  diarrhea  fever, chills (flu-like symptoms)  headaches  nausea, vomiting  redness, stinging, or swelling at site where injected This list may not describe all possible side effects. Call your doctor for medical advice about side effects. You may report side effects to FDA at 1-800-FDA-1088. Where should I keep my medicine? Keep out of the reach of children. Store in a refrigerator between 2 and 8 degrees C (36 and 46 degrees F). Do not freeze. Do not shake. Throw away any unused portion if using a single-dose vial. Throw away any unused medicine after the expiration date. NOTE: This sheet is a summary. It may not cover all possible information. If you have questions about this medicine, talk to your doctor, pharmacist, or health care provider.  2021 Elsevier/Gold Standard (2017-08-06 16:44:20)  

## 2020-12-14 ENCOUNTER — Telehealth: Payer: Self-pay | Admitting: Hematology

## 2020-12-14 NOTE — Telephone Encounter (Signed)
Scheduled follow-up appointments per 5/11 los. Patient is aware. Mailed calendar.

## 2020-12-18 DIAGNOSIS — Z1159 Encounter for screening for other viral diseases: Secondary | ICD-10-CM | POA: Diagnosis not present

## 2020-12-18 DIAGNOSIS — Z20828 Contact with and (suspected) exposure to other viral communicable diseases: Secondary | ICD-10-CM | POA: Diagnosis not present

## 2020-12-25 DIAGNOSIS — Z1159 Encounter for screening for other viral diseases: Secondary | ICD-10-CM | POA: Diagnosis not present

## 2020-12-25 DIAGNOSIS — Z20828 Contact with and (suspected) exposure to other viral communicable diseases: Secondary | ICD-10-CM | POA: Diagnosis not present

## 2020-12-28 DIAGNOSIS — Z23 Encounter for immunization: Secondary | ICD-10-CM | POA: Diagnosis not present

## 2021-01-01 DIAGNOSIS — Z1159 Encounter for screening for other viral diseases: Secondary | ICD-10-CM | POA: Diagnosis not present

## 2021-01-01 DIAGNOSIS — Z20828 Contact with and (suspected) exposure to other viral communicable diseases: Secondary | ICD-10-CM | POA: Diagnosis not present

## 2021-01-08 DIAGNOSIS — Z20828 Contact with and (suspected) exposure to other viral communicable diseases: Secondary | ICD-10-CM | POA: Diagnosis not present

## 2021-01-08 DIAGNOSIS — Z1159 Encounter for screening for other viral diseases: Secondary | ICD-10-CM | POA: Diagnosis not present

## 2021-01-09 ENCOUNTER — Other Ambulatory Visit: Payer: Self-pay

## 2021-01-09 DIAGNOSIS — D469 Myelodysplastic syndrome, unspecified: Secondary | ICD-10-CM

## 2021-01-10 ENCOUNTER — Inpatient Hospital Stay: Payer: Medicare Other | Attending: Hematology

## 2021-01-10 ENCOUNTER — Other Ambulatory Visit: Payer: Self-pay

## 2021-01-10 ENCOUNTER — Telehealth: Payer: Self-pay

## 2021-01-10 ENCOUNTER — Inpatient Hospital Stay: Payer: Medicare Other

## 2021-01-10 VITALS — BP 134/58 | HR 77 | Temp 98.7°F

## 2021-01-10 DIAGNOSIS — D469 Myelodysplastic syndrome, unspecified: Secondary | ICD-10-CM

## 2021-01-10 DIAGNOSIS — D649 Anemia, unspecified: Secondary | ICD-10-CM

## 2021-01-10 LAB — CBC WITH DIFFERENTIAL (CANCER CENTER ONLY)
Abs Immature Granulocytes: 0.06 10*3/uL (ref 0.00–0.07)
Basophils Absolute: 0 10*3/uL (ref 0.0–0.1)
Basophils Relative: 0 %
Eosinophils Absolute: 0 10*3/uL (ref 0.0–0.5)
Eosinophils Relative: 0 %
HCT: 28.1 % — ABNORMAL LOW (ref 36.0–46.0)
Hemoglobin: 9 g/dL — ABNORMAL LOW (ref 12.0–15.0)
Immature Granulocytes: 3 %
Lymphocytes Relative: 45 %
Lymphs Abs: 1 10*3/uL (ref 0.7–4.0)
MCH: 33.7 pg (ref 26.0–34.0)
MCHC: 32 g/dL (ref 30.0–36.0)
MCV: 105.2 fL — ABNORMAL HIGH (ref 80.0–100.0)
Monocytes Absolute: 0.8 10*3/uL (ref 0.1–1.0)
Monocytes Relative: 34 %
Neutro Abs: 0.4 10*3/uL — CL (ref 1.7–7.7)
Neutrophils Relative %: 18 %
Platelet Count: 65 10*3/uL — ABNORMAL LOW (ref 150–400)
RBC: 2.67 MIL/uL — ABNORMAL LOW (ref 3.87–5.11)
RDW: 16.3 % — ABNORMAL HIGH (ref 11.5–15.5)
WBC Count: 2.3 10*3/uL — ABNORMAL LOW (ref 4.0–10.5)
nRBC: 0 % (ref 0.0–0.2)

## 2021-01-10 LAB — SAMPLE TO BLOOD BANK

## 2021-01-10 LAB — CMP (CANCER CENTER ONLY)
ALT: 17 U/L (ref 0–44)
AST: 20 U/L (ref 15–41)
Albumin: 3.9 g/dL (ref 3.5–5.0)
Alkaline Phosphatase: 93 U/L (ref 38–126)
Anion gap: 9 (ref 5–15)
BUN: 20 mg/dL (ref 8–23)
CO2: 23 mmol/L (ref 22–32)
Calcium: 8.8 mg/dL — ABNORMAL LOW (ref 8.9–10.3)
Chloride: 106 mmol/L (ref 98–111)
Creatinine: 1.3 mg/dL — ABNORMAL HIGH (ref 0.44–1.00)
GFR, Estimated: 39 mL/min — ABNORMAL LOW (ref >60)
Glucose, Bld: 86 mg/dL (ref 70–99)
Potassium: 4.3 mmol/L (ref 3.5–5.1)
Sodium: 138 mmol/L (ref 135–145)
Total Bilirubin: 0.4 mg/dL (ref 0.3–1.2)
Total Protein: 7.3 g/dL (ref 6.5–8.1)

## 2021-01-10 MED ORDER — DARBEPOETIN ALFA 150 MCG/0.3ML IJ SOSY
150.0000 ug | PREFILLED_SYRINGE | Freq: Once | INTRAMUSCULAR | Status: AC
  Administered 2021-01-10: 12:00:00 150 ug via SUBCUTANEOUS
  Filled 2021-01-10: qty 0.3

## 2021-01-10 NOTE — Progress Notes (Unsigned)
CRITICAL VALUE STICKER  CRITICAL VALUE: ANC 0.4  DATE & TIME NOTIFIED: 01/10/21 13:00  MD NOTIFIED: Lorenso Courier  TIME OF NOTIFICATION:13:14

## 2021-01-10 NOTE — Telephone Encounter (Signed)
CRITICAL VALUE: ANC 0.4  DATE & TIME NOTIFIED: 01/10/2021 12:42pm  This nurse spoke with MD's  nurse and made aware of critical value. Acknowledged understanding.  No further questions at this time.

## 2021-01-15 DIAGNOSIS — Z1159 Encounter for screening for other viral diseases: Secondary | ICD-10-CM | POA: Diagnosis not present

## 2021-01-15 DIAGNOSIS — Z20828 Contact with and (suspected) exposure to other viral communicable diseases: Secondary | ICD-10-CM | POA: Diagnosis not present

## 2021-01-17 ENCOUNTER — Other Ambulatory Visit: Payer: Medicare Other

## 2021-01-17 ENCOUNTER — Ambulatory Visit: Payer: Medicare Other

## 2021-01-17 DIAGNOSIS — R350 Frequency of micturition: Secondary | ICD-10-CM | POA: Diagnosis not present

## 2021-01-17 DIAGNOSIS — Z1152 Encounter for screening for COVID-19: Secondary | ICD-10-CM | POA: Diagnosis not present

## 2021-01-17 DIAGNOSIS — D469 Myelodysplastic syndrome, unspecified: Secondary | ICD-10-CM | POA: Diagnosis not present

## 2021-01-17 DIAGNOSIS — R059 Cough, unspecified: Secondary | ICD-10-CM | POA: Diagnosis not present

## 2021-01-17 DIAGNOSIS — D509 Iron deficiency anemia, unspecified: Secondary | ICD-10-CM | POA: Diagnosis not present

## 2021-01-22 DIAGNOSIS — Z20828 Contact with and (suspected) exposure to other viral communicable diseases: Secondary | ICD-10-CM | POA: Diagnosis not present

## 2021-01-22 DIAGNOSIS — Z1159 Encounter for screening for other viral diseases: Secondary | ICD-10-CM | POA: Diagnosis not present

## 2021-01-29 DIAGNOSIS — Z1159 Encounter for screening for other viral diseases: Secondary | ICD-10-CM | POA: Diagnosis not present

## 2021-01-29 DIAGNOSIS — Z20828 Contact with and (suspected) exposure to other viral communicable diseases: Secondary | ICD-10-CM | POA: Diagnosis not present

## 2021-02-05 DIAGNOSIS — Z20828 Contact with and (suspected) exposure to other viral communicable diseases: Secondary | ICD-10-CM | POA: Diagnosis not present

## 2021-02-05 DIAGNOSIS — Z1159 Encounter for screening for other viral diseases: Secondary | ICD-10-CM | POA: Diagnosis not present

## 2021-02-06 ENCOUNTER — Other Ambulatory Visit: Payer: Self-pay | Admitting: *Deleted

## 2021-02-06 DIAGNOSIS — D469 Myelodysplastic syndrome, unspecified: Secondary | ICD-10-CM

## 2021-02-06 DIAGNOSIS — D649 Anemia, unspecified: Secondary | ICD-10-CM

## 2021-02-07 ENCOUNTER — Inpatient Hospital Stay: Payer: Medicare Other | Attending: Hematology

## 2021-02-07 ENCOUNTER — Other Ambulatory Visit: Payer: Self-pay

## 2021-02-07 ENCOUNTER — Inpatient Hospital Stay: Payer: Medicare Other

## 2021-02-07 VITALS — BP 126/70 | HR 80 | Temp 98.5°F | Resp 16

## 2021-02-07 DIAGNOSIS — D469 Myelodysplastic syndrome, unspecified: Secondary | ICD-10-CM

## 2021-02-07 DIAGNOSIS — Z79899 Other long term (current) drug therapy: Secondary | ICD-10-CM | POA: Insufficient documentation

## 2021-02-07 DIAGNOSIS — D649 Anemia, unspecified: Secondary | ICD-10-CM

## 2021-02-07 LAB — CMP (CANCER CENTER ONLY)
ALT: 15 U/L (ref 0–44)
AST: 20 U/L (ref 15–41)
Albumin: 3.8 g/dL (ref 3.5–5.0)
Alkaline Phosphatase: 94 U/L (ref 38–126)
Anion gap: 7 (ref 5–15)
BUN: 20 mg/dL (ref 8–23)
CO2: 24 mmol/L (ref 22–32)
Calcium: 9.1 mg/dL (ref 8.9–10.3)
Chloride: 106 mmol/L (ref 98–111)
Creatinine: 1.29 mg/dL — ABNORMAL HIGH (ref 0.44–1.00)
GFR, Estimated: 40 mL/min — ABNORMAL LOW (ref >60)
Glucose, Bld: 104 mg/dL — ABNORMAL HIGH (ref 70–99)
Potassium: 4.8 mmol/L (ref 3.5–5.1)
Sodium: 137 mmol/L (ref 135–145)
Total Bilirubin: 0.4 mg/dL (ref 0.3–1.2)
Total Protein: 7.4 g/dL (ref 6.5–8.1)

## 2021-02-07 LAB — CBC WITH DIFFERENTIAL (CANCER CENTER ONLY)
Abs Immature Granulocytes: 0.06 10*3/uL (ref 0.00–0.07)
Basophils Absolute: 0 10*3/uL (ref 0.0–0.1)
Basophils Relative: 0 %
Eosinophils Absolute: 0 10*3/uL (ref 0.0–0.5)
Eosinophils Relative: 0 %
HCT: 28.7 % — ABNORMAL LOW (ref 36.0–46.0)
Hemoglobin: 9.2 g/dL — ABNORMAL LOW (ref 12.0–15.0)
Immature Granulocytes: 2 %
Lymphocytes Relative: 42 %
Lymphs Abs: 1 10*3/uL (ref 0.7–4.0)
MCH: 34.2 pg — ABNORMAL HIGH (ref 26.0–34.0)
MCHC: 32.1 g/dL (ref 30.0–36.0)
MCV: 106.7 fL — ABNORMAL HIGH (ref 80.0–100.0)
Monocytes Absolute: 0.9 10*3/uL (ref 0.1–1.0)
Monocytes Relative: 37 %
Neutro Abs: 0.5 10*3/uL — ABNORMAL LOW (ref 1.7–7.7)
Neutrophils Relative %: 19 %
Platelet Count: 71 10*3/uL — ABNORMAL LOW (ref 150–400)
RBC: 2.69 MIL/uL — ABNORMAL LOW (ref 3.87–5.11)
RDW: 16.3 % — ABNORMAL HIGH (ref 11.5–15.5)
WBC Count: 2.5 10*3/uL — ABNORMAL LOW (ref 4.0–10.5)
nRBC: 0 % (ref 0.0–0.2)

## 2021-02-07 LAB — SAMPLE TO BLOOD BANK

## 2021-02-07 MED ORDER — DARBEPOETIN ALFA 150 MCG/0.3ML IJ SOSY
150.0000 ug | PREFILLED_SYRINGE | Freq: Once | INTRAMUSCULAR | Status: AC
  Administered 2021-02-07: 150 ug via SUBCUTANEOUS
  Filled 2021-02-07: qty 0.3

## 2021-02-07 NOTE — Patient Instructions (Signed)
Darbepoetin Alfa injection What is this medication? DARBEPOETIN ALFA (dar be POE e tin AL fa) helps your body make more red blood cells. It is used to treat anemia caused by chronic kidney failure and chemotherapy. This medicine may be used for other purposes; ask your health care provider or pharmacist if you have questions. COMMON BRAND NAME(S): Aranesp What should I tell my care team before I take this medication? They need to know if you have any of these conditions: blood clotting disorders or history of blood clots cancer patient not on chemotherapy cystic fibrosis heart disease, such as angina, heart failure, or a history of a heart attack hemoglobin level of 12 g/dL or greater high blood pressure low levels of folate, iron, or vitamin B12 seizures an unusual or allergic reaction to darbepoetin, erythropoietin, albumin, hamster proteins, latex, other medicines, foods, dyes, or preservatives pregnant or trying to get pregnant breast-feeding How should I use this medication? This medicine is for injection into a vein or under the skin. It is usually given by a health care professional in a hospital or clinic setting. If you get this medicine at home, you will be taught how to prepare and give this medicine. Use exactly as directed. Take your medicine at regular intervals. Do not take your medicine more often than directed. It is important that you put your used needles and syringes in a special sharps container. Do not put them in a trash can. If you do not have a sharps container, call your pharmacist or healthcare provider to get one. A special MedGuide will be given to you by the pharmacist with each prescription and refill. Be sure to read this information carefully each time. Talk to your pediatrician regarding the use of this medicine in children. While this medicine may be used in children as young as 1 month of age for selected conditions, precautions do apply. Overdosage: If you  think you have taken too much of this medicine contact a poison control center or emergency room at once. NOTE: This medicine is only for you. Do not share this medicine with others. What if I miss a dose? If you miss a dose, take it as soon as you can. If it is almost time for your next dose, take only that dose. Do not take double or extra doses. What may interact with this medication? Do not take this medicine with any of the following medications: epoetin alfa This list may not describe all possible interactions. Give your health care provider a list of all the medicines, herbs, non-prescription drugs, or dietary supplements you use. Also tell them if you smoke, drink alcohol, or use illegal drugs. Some items may interact with your medicine. What should I watch for while using this medication? Your condition will be monitored carefully while you are receiving this medicine. You may need blood work done while you are taking this medicine. This medicine may cause a decrease in vitamin B6. You should make sure that you get enough vitamin B6 while you are taking this medicine. Discuss the foods you eat and the vitamins you take with your health care professional. What side effects may I notice from receiving this medication? Side effects that you should report to your doctor or health care professional as soon as possible: allergic reactions like skin rash, itching or hives, swelling of the face, lips, or tongue breathing problems changes in vision chest pain confusion, trouble speaking or understanding feeling faint or lightheaded, falls high blood pressure   muscle aches or pains pain, swelling, warmth in the leg rapid weight gain severe headaches sudden numbness or weakness of the face, arm or leg trouble walking, dizziness, loss of balance or coordination seizures (convulsions) swelling of the ankles, feet, hands unusually weak or tired Side effects that usually do not require medical  attention (report to your doctor or health care professional if they continue or are bothersome): diarrhea fever, chills (flu-like symptoms) headaches nausea, vomiting redness, stinging, or swelling at site where injected This list may not describe all possible side effects. Call your doctor for medical advice about side effects. You may report side effects to FDA at 1-800-FDA-1088. Where should I keep my medication? Keep out of the reach of children. Store in a refrigerator between 2 and 8 degrees C (36 and 46 degrees F). Do not freeze. Do not shake. Throw away any unused portion if using a single-dose vial. Throw away any unused medicine after the expiration date. NOTE: This sheet is a summary. It may not cover all possible information. If you have questions about this medicine, talk to your doctor, pharmacist, or health care provider.  2022 Elsevier/Gold Standard (2017-08-06 16:44:20)  

## 2021-02-12 DIAGNOSIS — E039 Hypothyroidism, unspecified: Secondary | ICD-10-CM | POA: Diagnosis not present

## 2021-02-12 DIAGNOSIS — Z1159 Encounter for screening for other viral diseases: Secondary | ICD-10-CM | POA: Diagnosis not present

## 2021-02-12 DIAGNOSIS — M810 Age-related osteoporosis without current pathological fracture: Secondary | ICD-10-CM | POA: Diagnosis not present

## 2021-02-12 DIAGNOSIS — E785 Hyperlipidemia, unspecified: Secondary | ICD-10-CM | POA: Diagnosis not present

## 2021-02-12 DIAGNOSIS — Z20828 Contact with and (suspected) exposure to other viral communicable diseases: Secondary | ICD-10-CM | POA: Diagnosis not present

## 2021-02-19 DIAGNOSIS — E039 Hypothyroidism, unspecified: Secondary | ICD-10-CM | POA: Diagnosis not present

## 2021-02-19 DIAGNOSIS — Z20828 Contact with and (suspected) exposure to other viral communicable diseases: Secondary | ICD-10-CM | POA: Diagnosis not present

## 2021-02-19 DIAGNOSIS — R82998 Other abnormal findings in urine: Secondary | ICD-10-CM | POA: Diagnosis not present

## 2021-02-19 DIAGNOSIS — D649 Anemia, unspecified: Secondary | ICD-10-CM | POA: Diagnosis not present

## 2021-02-19 DIAGNOSIS — Z0000 Encounter for general adult medical examination without abnormal findings: Secondary | ICD-10-CM | POA: Diagnosis not present

## 2021-02-19 DIAGNOSIS — Z1339 Encounter for screening examination for other mental health and behavioral disorders: Secondary | ICD-10-CM | POA: Diagnosis not present

## 2021-02-19 DIAGNOSIS — M1990 Unspecified osteoarthritis, unspecified site: Secondary | ICD-10-CM | POA: Diagnosis not present

## 2021-02-19 DIAGNOSIS — E785 Hyperlipidemia, unspecified: Secondary | ICD-10-CM | POA: Diagnosis not present

## 2021-02-19 DIAGNOSIS — K219 Gastro-esophageal reflux disease without esophagitis: Secondary | ICD-10-CM | POA: Diagnosis not present

## 2021-02-19 DIAGNOSIS — D61818 Other pancytopenia: Secondary | ICD-10-CM | POA: Diagnosis not present

## 2021-02-19 DIAGNOSIS — Z1159 Encounter for screening for other viral diseases: Secondary | ICD-10-CM | POA: Diagnosis not present

## 2021-02-19 DIAGNOSIS — M810 Age-related osteoporosis without current pathological fracture: Secondary | ICD-10-CM | POA: Diagnosis not present

## 2021-02-19 DIAGNOSIS — Z1331 Encounter for screening for depression: Secondary | ICD-10-CM | POA: Diagnosis not present

## 2021-03-01 DIAGNOSIS — Z8551 Personal history of malignant neoplasm of bladder: Secondary | ICD-10-CM | POA: Diagnosis not present

## 2021-03-05 DIAGNOSIS — Z1159 Encounter for screening for other viral diseases: Secondary | ICD-10-CM | POA: Diagnosis not present

## 2021-03-05 DIAGNOSIS — Z20828 Contact with and (suspected) exposure to other viral communicable diseases: Secondary | ICD-10-CM | POA: Diagnosis not present

## 2021-03-06 ENCOUNTER — Other Ambulatory Visit: Payer: Self-pay

## 2021-03-06 DIAGNOSIS — D469 Myelodysplastic syndrome, unspecified: Secondary | ICD-10-CM

## 2021-03-07 ENCOUNTER — Inpatient Hospital Stay: Payer: Medicare Other

## 2021-03-07 ENCOUNTER — Inpatient Hospital Stay: Payer: Medicare Other | Admitting: Hematology

## 2021-03-07 ENCOUNTER — Ambulatory Visit: Payer: Medicare Other

## 2021-03-07 ENCOUNTER — Inpatient Hospital Stay: Payer: Medicare Other | Attending: Hematology

## 2021-03-07 ENCOUNTER — Other Ambulatory Visit: Payer: Medicare Other

## 2021-03-07 DIAGNOSIS — D469 Myelodysplastic syndrome, unspecified: Secondary | ICD-10-CM | POA: Insufficient documentation

## 2021-03-08 NOTE — Progress Notes (Signed)
Pt tested positive with COVID on 03/05/21. Pt has received anti viral from PCP. Pt will return to [facility] on 04/04/21.

## 2021-03-12 DIAGNOSIS — Z8616 Personal history of COVID-19: Secondary | ICD-10-CM | POA: Diagnosis not present

## 2021-03-19 DIAGNOSIS — Z8616 Personal history of COVID-19: Secondary | ICD-10-CM | POA: Diagnosis not present

## 2021-03-26 DIAGNOSIS — Z8616 Personal history of COVID-19: Secondary | ICD-10-CM | POA: Diagnosis not present

## 2021-04-02 DIAGNOSIS — Z8616 Personal history of COVID-19: Secondary | ICD-10-CM | POA: Diagnosis not present

## 2021-04-04 ENCOUNTER — Telehealth: Payer: Self-pay | Admitting: Hematology

## 2021-04-04 ENCOUNTER — Other Ambulatory Visit: Payer: Self-pay

## 2021-04-04 ENCOUNTER — Inpatient Hospital Stay: Payer: Medicare Other

## 2021-04-04 ENCOUNTER — Inpatient Hospital Stay (HOSPITAL_BASED_OUTPATIENT_CLINIC_OR_DEPARTMENT_OTHER): Payer: Medicare Other | Admitting: Hematology

## 2021-04-04 ENCOUNTER — Other Ambulatory Visit: Payer: Medicare Other

## 2021-04-04 ENCOUNTER — Ambulatory Visit: Payer: Medicare Other

## 2021-04-04 VITALS — BP 116/49 | HR 94 | Temp 98.7°F | Resp 18 | Wt 126.1 lb

## 2021-04-04 DIAGNOSIS — D649 Anemia, unspecified: Secondary | ICD-10-CM

## 2021-04-04 DIAGNOSIS — D469 Myelodysplastic syndrome, unspecified: Secondary | ICD-10-CM | POA: Diagnosis not present

## 2021-04-04 DIAGNOSIS — D61818 Other pancytopenia: Secondary | ICD-10-CM | POA: Diagnosis not present

## 2021-04-04 LAB — CBC WITH DIFFERENTIAL (CANCER CENTER ONLY)
Abs Immature Granulocytes: 0 10*3/uL (ref 0.00–0.07)
Basophils Absolute: 0 10*3/uL (ref 0.0–0.1)
Basophils Relative: 0 %
Eosinophils Absolute: 0 10*3/uL (ref 0.0–0.5)
Eosinophils Relative: 0 %
HCT: 26.1 % — ABNORMAL LOW (ref 36.0–46.0)
Hemoglobin: 8.3 g/dL — ABNORMAL LOW (ref 12.0–15.0)
Lymphocytes Relative: 40 %
Lymphs Abs: 0.8 10*3/uL (ref 0.7–4.0)
MCH: 33.6 pg (ref 26.0–34.0)
MCHC: 31.8 g/dL (ref 30.0–36.0)
MCV: 105.7 fL — ABNORMAL HIGH (ref 80.0–100.0)
Monocytes Absolute: 0.7 10*3/uL (ref 0.1–1.0)
Monocytes Relative: 37 %
Neutro Abs: 0.5 10*3/uL — ABNORMAL LOW (ref 1.7–7.7)
Neutrophils Relative %: 23 %
Platelet Count: 60 10*3/uL — ABNORMAL LOW (ref 150–400)
RBC: 2.47 MIL/uL — ABNORMAL LOW (ref 3.87–5.11)
RDW: 16 % — ABNORMAL HIGH (ref 11.5–15.5)
WBC Count: 2 10*3/uL — ABNORMAL LOW (ref 4.0–10.5)
nRBC: 0 % (ref 0.0–0.2)

## 2021-04-04 LAB — CMP (CANCER CENTER ONLY)
ALT: 17 U/L (ref 0–44)
AST: 18 U/L (ref 15–41)
Albumin: 3.9 g/dL (ref 3.5–5.0)
Alkaline Phosphatase: 86 U/L (ref 38–126)
Anion gap: 6 (ref 5–15)
BUN: 22 mg/dL (ref 8–23)
CO2: 24 mmol/L (ref 22–32)
Calcium: 9.2 mg/dL (ref 8.9–10.3)
Chloride: 107 mmol/L (ref 98–111)
Creatinine: 1.33 mg/dL — ABNORMAL HIGH (ref 0.44–1.00)
GFR, Estimated: 38 mL/min — ABNORMAL LOW (ref >60)
Glucose, Bld: 105 mg/dL — ABNORMAL HIGH (ref 70–99)
Potassium: 4.5 mmol/L (ref 3.5–5.1)
Sodium: 137 mmol/L (ref 135–145)
Total Bilirubin: 0.5 mg/dL (ref 0.3–1.2)
Total Protein: 7.2 g/dL (ref 6.5–8.1)

## 2021-04-04 LAB — SAMPLE TO BLOOD BANK

## 2021-04-04 MED ORDER — PREDNISONE 20 MG PO TABS: 20.0000 mg | ORAL_TABLET | Freq: Every day | ORAL | 0 refills | Status: AC

## 2021-04-04 MED ORDER — DARBEPOETIN ALFA 150 MCG/0.3ML IJ SOSY
150.0000 ug | PREFILLED_SYRINGE | Freq: Once | INTRAMUSCULAR | Status: AC
  Administered 2021-04-04: 150 ug via SUBCUTANEOUS
  Filled 2021-04-04: qty 0.3

## 2021-04-04 MED ORDER — AMOXICILLIN-POT CLAVULANATE 875-125 MG PO TABS: 1.0000 | ORAL_TABLET | Freq: Two times a day (BID) | ORAL | 0 refills | Status: AC

## 2021-04-04 NOTE — Telephone Encounter (Signed)
Scheduled follow-up appointments per 8/31 los. Patient is aware. 

## 2021-04-09 DIAGNOSIS — Z8616 Personal history of COVID-19: Secondary | ICD-10-CM | POA: Diagnosis not present

## 2021-04-10 ENCOUNTER — Encounter: Payer: Self-pay | Admitting: Hematology

## 2021-04-10 NOTE — Progress Notes (Signed)
HEMATOLOGY/ONCOLOGY CLINIC NOTE  Date of Service: 04/10/2021  Patient Care Team: Burnard Bunting, MD as PCP - General (Internal Medicine)  REFERRING PHYSICIAN: Burnard Bunting, MD  CHIEF COMPLAINTS/PURPOSE OF CONSULTATION:  Pancytopenia  HISTORY OF PRESENTING ILLNESS:  Alison Bridges is a wonderful 87 y.o. female who has been referred to [place] by Burnard Bunting, MD for evaluation and management of Pancytopenia. The pt reports that she is doing well overall.    The pt reports she is good. Pt lives alone. Prior to the pandemic she was taking a workout class. About the last 3 months pt has had fatigue, loss of appetite, and weight loss. Last October pt had fatigue, SOB, and was spitting up blood. She has acid reflux and had esophagus dilated twice. A week ago pt was in the hospital due to infection in her right ankle. Her ankle started swelling and turning red. The ankle was not painful but did have burning and tightness. She was put on antibiotics and iron.  Pt uses partial dentures and does not use denture cream. She has been having a burning and hot feeling in feet and face that comes and goes. Her thyroid medication has been stable and she takes the pills several hours before eating with just water. Pt has loss about 10 pounds due to not having appetite. Recently she has had spontaneous bruising. She has had both doses of COVID19 vaccine. Pt had been taking '50mg'$  of zync for 4-6weeks prior to hospitalization.    Of note prior to the patient's visit today, pt has had DG Ankle Complete Right ([id number]) completed on 12/08/19 with results revealing "Soft tissue swelling without acute osseous finding." Pt has had Lower Venous DVT Study completed on 12/09/19 with results revealing "RIGHT: There is no evidence of deep vein thrombosis in the lower extremity. No cystic structure found in the popliteal fossa. LEFT: No evidence of common femoral vein obstruction." Pt has had CT Abdomen Pelvis W IV  Contrast completed on 10/07/19 with results revealing, " 1. No acute finding 2. Areas of enhancement in the right bladder worrisome for neoplasm 3. Cholecystectomy 4. Bilateral renal cortical cysts and nonobstructive left renal calculi. 5. Aortoiliac atherosclerosis without aneurysm. 6. Hysterectomy 7. Diverticulosis without diverticulitis. 8. Lumbar DDD and DJD with degenerative anterolisthesis of L5 on S1"   Most recent lab results (12/10/19) of CBC is as follows: all values are WNL except for WBC at 2.9K, RBC at 2.35, Hemoglobin at 8.2, HCT at 25.1, MCV at 106.8, MCH at 34.9, Platelets at 66K, Neutro Abs at 0.6K, Monocytes Absolute at 1.4K, Glucose at 105, Calcium at 8.4, GFR, Calc Non Af Amer at 54 12/08/19 of Lactic Acid, Venous at 1.3 12/08/19 of CRP at 0.8: WNL 12/08/19 of Reticulocytes is as follows: all values are WNL except for RC at 2.52, Immature Retic Fract at 16.9 12/08/19 of Ferritin at 167: WNL 12/08/19 of Iron and TIBC is as follows: all values are WNL except for Iron at 27, Saturation Ratios at 10 12/08/19 of Folate at 52.3: WNL 12/08/19 of Vitamin B12 at 931 12/08/19 of Sed Rate at 60   On review of systems, pt reports fatigue, lack of appetite, burning/hot sensation in face and feet, weight loss  and denies lumps/bumps, skin rashes, bone pain, fever, chills, night sweats, changes in bowl habits and urinary habits, nose bleeds, gum bleeds, blood in urine/stool, lightheaded, dizzy, changes in breathing, back pain, constipation and any other symptoms.   INTERVAL HISTORY:   Alison Bridges is a wonderful 87 y.o. female who is here for evaluation and management of Pancytopenia - likely from MDS +/- MPN. The patient's last visit with [place] was on 12/13/2020. The pt reports that he is doing well overall.  The pt reports that she had COVID-19 infection and missed her last EPO/Aranesp injection about a month ago. Notes some increased fatigue.  Her hemoglobin is down to 8.3.  No  lightheadedness no dizziness.  No chest pain or acute shortness of breath.  Lab results today 0 04/04/2021 shows a hemoglobin of 8.3 with an MCV of 105.7 WBC count of 2 k with an ANC of 500 and a platelet count of 60k. CMP within normal limits  on review of systems, pt reports fatigue, weight gain, intermittent constipation and denies fevers, chills, infection issues, abdominal pain, leg swelling, and any other symptoms.  MEDICAL HISTORY:  Past Medical History:  Diagnosis Date   Depression    Hypothyroidism    Osteoporosis    SURGICAL HISTORY: Past Surgical History:  Procedure Laterality Date   ABDOMINAL HYSTERECTOMY     APPENDECTOMY  1998   CHOLECYSTECTOMY  1998   SOCIAL HISTORY: Social History   Socioeconomic History   Marital status: Widowed    Spouse name: Not on file   Number of children: Not on file   Years of education: Not on file   Highest education level: Not on file  Occupational History   Occupation: Oncologist    Comment: Retired  Tobacco Use   Smoking status: Never   Smokeless tobacco: Never  Substance and Sexual Activity   Alcohol use: No   Drug use: No   Sexual activity: Not on file  Other Topics Concern   Not on file  Social History Narrative   She is tried to stay active --  2 days a week she does senior aerobics.  She also enjoys going for walks, but no standard routine.      She has 3 children and 2 grandchildren.   Social Determinants of Health   Financial Resource Strain: Not on file  Food Insecurity: Not on file  Transportation Needs: Not on file  Physical Activity: Not on file  Stress: Not on file  Social Connections: Not on file  Intimate Partner Violence: Not on file     FAMILY HISTORY: Family History  Problem Relation Age of Onset   Heart attack Mother 58   Pancreatic cancer Father    Parkinson's disease Sister    Diabetes Brother        Multiple complications   Heart disease Brother        Not sure what happened    Dementia Brother      ALLERGIES:   has No Known Allergies.   MEDICATIONS:  Current Outpatient Medications  Medication Sig Dispense Refill   amoxicillin-clavulanate (AUGMENTIN) 875-125 MG tablet Take 1 tablet by mouth 2 (two) times daily for 7 days. 14 tablet 0   Ascorbic Acid (VITAMIN C PO) Take 1 tablet by mouth daily.     b complex vitamins capsule Take 1 capsule by mouth daily.     cholecalciferol (VITAMIN D) 1000 UNITS tablet Take 1,000 Units by mouth daily.     levothyroxine (SYNTHROID, LEVOTHROID) 125 MCG tablet Take 125 mcg by mouth daily before breakfast.     Multiple Vitamin (MULTIVITAMIN WITH MINERALS) TABS tablet Take 1 tablet by mouth daily.     pantoprazole (PROTONIX) 40 MG tablet Take 40 mg by mouth  as needed (acid reflux).     sertraline (ZOLOFT) 100 MG tablet Take 100 mg by mouth daily.     polyethylene glycol (MIRALAX / GLYCOLAX) 17 g packet Take 17 g by mouth daily as needed for mild constipation. (Patient not taking: No sig reported)     Sennosides (SENOKOT PO) Take 1 tablet by mouth at bedtime as needed. (Patient not taking: No sig reported)     No current facility-administered medications for this visit.     REVIEW OF SYSTEMS:   .10 Point review of Systems was done is negative except as noted above.  PHYSICAL EXAMINATION: ECOG PERFORMANCE STATUS: 2 - Symptomatic, <50% confined to bed  Vitals:   04/04/21 1014  BP: (!) 116/49  Pulse: 94  Resp: 18  Temp: 98.7 F (37.1 C)  SpO2: 97%   Filed Weights   04/04/21 1014  Weight: 126 lb 1.6 oz (57.2 kg)   Body mass index is 19.17 kg/m.  Exam was given in a chair.  Marland Kitchen GENERAL:alert, in no acute distress and comfortable SKIN: no acute rashes, no significant lesions EYES: conjunctiva are pink and non-injected, sclera anicteric OROPHARYNX: MMM, no exudates, no oropharyngeal erythema or ulceration NECK: supple, no JVD LYMPH:  no palpable lymphadenopathy in the cervical, axillary or inguinal  regions LUNGS: clear to auscultation b/l with normal respiratory effort HEART: regular rate & rhythm ABDOMEN:  normoactive bowel sounds , non tender, not distended. Extremity: no pedal edema PSYCH: alert & oriented x 3 with fluent speech NEURO: no focal motor/sensory deficits   LABORATORY DATA:  I have reviewed the data as listed  CBC Latest Ref Rng & Units 04/04/2021 02/07/2021 01/10/2021  WBC 4.0 - 10.5 K/uL 2.0(L) 2.5(L) 2.3(L)  Hemoglobin 12.0 - 15.0 g/dL 8.3(L) 9.2(L) 9.0(L)  Hematocrit 36.0 - 46.0 % 26.1(L) 28.7(L) 28.1(L)  Platelets 150 - 400 K/uL 60(L) 71(L) 65(L)    CMP Latest Ref Rng & Units 04/04/2021 02/07/2021 01/10/2021  Glucose 70 - 99 mg/dL 105(H) 104(H) 86  BUN 8 - 23 mg/dL '22 20 20  '$ Creatinine 0.44 - 1.00 mg/dL 1.33(H) 1.29(H) 1.30(H)  Sodium 135 - 145 mmol/L 137 137 138  Potassium 3.5 - 5.1 mmol/L 4.5 4.8 4.3  Chloride 98 - 111 mmol/L 107 106 106  CO2 22 - 32 mmol/L '24 24 23  '$ Calcium 8.9 - 10.3 mg/dL 9.2 9.1 8.8(L)  Total Protein 6.5 - 8.1 g/dL 7.2 7.4 7.3  Total Bilirubin 0.3 - 1.2 mg/dL 0.5 0.4 0.4  Alkaline Phos 38 - 126 U/L 86 94 93  AST 15 - 41 U/L '18 20 20  '$ ALT 0 - 44 U/L '17 15 17   '$ . Lab Results  Component Value Date   IRON 90 08/16/2020   TIBC 211 (L) 08/16/2020   IRONPCTSAT 43 08/16/2020   (Iron and TIBC)  Lab Results  Component Value Date   FERRITIN 1,482 (H) 08/16/2020     01/10/2020 Cytogenetics [id number]):    01/10/2020 FISH Panel [id number]):   01/10/2020 BM Bx Report ([id number]):    12/16/2019 JAK2, MPL, CALR Panel Report:    RADIOGRAPHIC STUDIES: I have personally reviewed the radiological images as listed and agreed with the findings in the report. No results found.   ASSESSMENT & PLAN:   Alison Bridges is a 87 y.o. female with:   Pancytopenia - likely from MDS +/- MPN -01/10/2020 BM Bx Surgical Pathology Report ([id number]) revealed "BONE MARROW, ASPIRATE, CLOT, CORE: -Hypercellular marrow with myeloid  hyperplasia and megakaryocytic atypia  PERIPHERAL BLOOD: -  Pancytopenia". -12/16/19 JAK2 (including V617 and Exon 12), MPL, and CALR-Next Generation Sequencing shows "Tier II: Variants of Potential Clinical Significance - SRSF2 p.Pro95His" 2. Bladder lesion -- patient following with urology  PLAN: -Discussed pt labwork today, 04/04/2021.  Her hemoglobin is lower at 8.3 in the context of missing her previous iron stores due to COVID-19. -No indication of PRBC transfusion at this time -We will continue same dose of Aranesp.. -Advised pt there is no need for G-CSF at this time due no severe infection issues. Also discussed pros and cons of prophylactic antimicrobials..holding for now. -Continue monthly 150 mg Aranesp at the lowest possible dose for goal Hgb ? 10. -Continue daily B-complex vitamin -Will see back 3 months with labs.   FOLLOW UP: Please schedule next 6 doses of monthly Aranesp with labs MD visit in 4 weeks PRBC transfusion appointment for 1 unit in 4 weeks if needed  . The total time spent in the appointment was 20 minutes and more than 50% was on counseling and direct patient cares.   All of the patient's questions were answered with apparent satisfaction. The patient knows to call the clinic with any problems, questions or concerns.   Sullivan Lone MD [place] AAHIVMS [facility] Hematology/Oncology Physician [facility]  (Office):       [phone] (Work cell):  [phone] (Fax):           [phone]

## 2021-04-16 DIAGNOSIS — R051 Acute cough: Secondary | ICD-10-CM | POA: Diagnosis not present

## 2021-04-16 DIAGNOSIS — Z1152 Encounter for screening for COVID-19: Secondary | ICD-10-CM | POA: Diagnosis not present

## 2021-04-16 DIAGNOSIS — R0602 Shortness of breath: Secondary | ICD-10-CM | POA: Diagnosis not present

## 2021-04-16 DIAGNOSIS — R5383 Other fatigue: Secondary | ICD-10-CM | POA: Diagnosis not present

## 2021-04-16 DIAGNOSIS — D649 Anemia, unspecified: Secondary | ICD-10-CM | POA: Diagnosis not present

## 2021-04-16 DIAGNOSIS — R0981 Nasal congestion: Secondary | ICD-10-CM | POA: Diagnosis not present

## 2021-04-16 DIAGNOSIS — D509 Iron deficiency anemia, unspecified: Secondary | ICD-10-CM | POA: Diagnosis not present

## 2021-04-16 DIAGNOSIS — D469 Myelodysplastic syndrome, unspecified: Secondary | ICD-10-CM | POA: Diagnosis not present

## 2021-04-16 DIAGNOSIS — Z8616 Personal history of COVID-19: Secondary | ICD-10-CM | POA: Diagnosis not present

## 2021-04-16 DIAGNOSIS — D61818 Other pancytopenia: Secondary | ICD-10-CM | POA: Diagnosis not present

## 2021-04-16 DIAGNOSIS — J069 Acute upper respiratory infection, unspecified: Secondary | ICD-10-CM | POA: Diagnosis not present

## 2021-04-30 DIAGNOSIS — Z8616 Personal history of COVID-19: Secondary | ICD-10-CM | POA: Diagnosis not present

## 2021-05-01 ENCOUNTER — Other Ambulatory Visit: Payer: Self-pay

## 2021-05-01 DIAGNOSIS — D469 Myelodysplastic syndrome, unspecified: Secondary | ICD-10-CM

## 2021-05-02 ENCOUNTER — Ambulatory Visit (HOSPITAL_COMMUNITY)
Admission: RE | Admit: 2021-05-02 | Discharge: 2021-05-02 | Disposition: A | Discharge status: Home / Self Care | Payer: Medicare Other | Source: Ambulatory Visit | Attending: Hematology | Admitting: Hematology

## 2021-05-02 ENCOUNTER — Ambulatory Visit: Payer: Medicare Other

## 2021-05-02 ENCOUNTER — Other Ambulatory Visit: Payer: Medicare Other

## 2021-05-02 ENCOUNTER — Inpatient Hospital Stay: Payer: Medicare Other

## 2021-05-02 ENCOUNTER — Inpatient Hospital Stay: Payer: Medicare Other | Attending: Hematology

## 2021-05-02 ENCOUNTER — Inpatient Hospital Stay (HOSPITAL_BASED_OUTPATIENT_CLINIC_OR_DEPARTMENT_OTHER): Payer: Medicare Other | Admitting: Hematology

## 2021-05-02 ENCOUNTER — Other Ambulatory Visit: Payer: Self-pay

## 2021-05-02 VITALS — BP 150/64 | HR 83 | Temp 98.2°F | Resp 18 | Wt 128.3 lb

## 2021-05-02 VITALS — BP 143/52 | HR 74 | Temp 97.8°F | Resp 16

## 2021-05-02 DIAGNOSIS — D46Z Other myelodysplastic syndromes: Secondary | ICD-10-CM | POA: Diagnosis present

## 2021-05-02 DIAGNOSIS — D649 Anemia, unspecified: Secondary | ICD-10-CM

## 2021-05-02 DIAGNOSIS — D469 Myelodysplastic syndrome, unspecified: Secondary | ICD-10-CM

## 2021-05-02 DIAGNOSIS — R059 Cough, unspecified: Secondary | ICD-10-CM

## 2021-05-02 DIAGNOSIS — Z298 Encounter for other specified prophylactic measures: Secondary | ICD-10-CM | POA: Insufficient documentation

## 2021-05-02 LAB — CMP (CANCER CENTER ONLY)
ALT: 16 U/L (ref 0–44)
AST: 18 U/L (ref 15–41)
Albumin: 4 g/dL (ref 3.5–5.0)
Alkaline Phosphatase: 93 U/L (ref 38–126)
Anion gap: 10 (ref 5–15)
BUN: 21 mg/dL (ref 8–23)
CO2: 21 mmol/L — ABNORMAL LOW (ref 22–32)
Calcium: 9.3 mg/dL (ref 8.9–10.3)
Chloride: 107 mmol/L (ref 98–111)
Creatinine: 1.25 mg/dL — ABNORMAL HIGH (ref 0.44–1.00)
GFR, Estimated: 41 mL/min — ABNORMAL LOW (ref >60)
Glucose, Bld: 92 mg/dL (ref 70–99)
Potassium: 4.1 mmol/L (ref 3.5–5.1)
Sodium: 138 mmol/L (ref 135–145)
Total Bilirubin: 0.4 mg/dL (ref 0.3–1.2)
Total Protein: 7.4 g/dL (ref 6.5–8.1)

## 2021-05-02 LAB — CBC WITH DIFFERENTIAL (CANCER CENTER ONLY)
Abs Immature Granulocytes: 0.06 10*3/uL (ref 0.00–0.07)
Basophils Absolute: 0 10*3/uL (ref 0.0–0.1)
Basophils Relative: 0 %
Eosinophils Absolute: 0 10*3/uL (ref 0.0–0.5)
Eosinophils Relative: 0 %
HCT: 27.2 % — ABNORMAL LOW (ref 36.0–46.0)
Hemoglobin: 8.8 g/dL — ABNORMAL LOW (ref 12.0–15.0)
Immature Granulocytes: 3 %
Lymphocytes Relative: 35 %
Lymphs Abs: 0.9 10*3/uL (ref 0.7–4.0)
MCH: 34.4 pg — ABNORMAL HIGH (ref 26.0–34.0)
MCHC: 32.4 g/dL (ref 30.0–36.0)
MCV: 106.3 fL — ABNORMAL HIGH (ref 80.0–100.0)
Monocytes Absolute: 0.8 10*3/uL (ref 0.1–1.0)
Monocytes Relative: 34 %
Neutro Abs: 0.7 10*3/uL — ABNORMAL LOW (ref 1.7–7.7)
Neutrophils Relative %: 28 %
Platelet Count: 74 10*3/uL — ABNORMAL LOW (ref 150–400)
RBC: 2.56 MIL/uL — ABNORMAL LOW (ref 3.87–5.11)
RDW: 16.6 % — ABNORMAL HIGH (ref 11.5–15.5)
WBC Count: 2.4 10*3/uL — ABNORMAL LOW (ref 4.0–10.5)
nRBC: 0 % (ref 0.0–0.2)

## 2021-05-02 LAB — SAMPLE TO BLOOD BANK

## 2021-05-02 MED ORDER — TIXAGEVIMAB (PART OF EVUSHELD) INJECTION
300.0000 mg | Freq: Once | INTRAMUSCULAR | Status: AC
  Administered 2021-05-02: 300 mg via INTRAMUSCULAR
  Filled 2021-05-02: qty 3

## 2021-05-02 MED ORDER — CILGAVIMAB (PART OF EVUSHELD) INJECTION
300.0000 mg | Freq: Once | INTRAMUSCULAR | Status: AC
  Administered 2021-05-02: 300 mg via INTRAMUSCULAR
  Filled 2021-05-02: qty 3

## 2021-05-02 MED ORDER — DARBEPOETIN ALFA 150 MCG/0.3ML IJ SOSY
150.0000 ug | PREFILLED_SYRINGE | Freq: Once | INTRAMUSCULAR | Status: AC
  Administered 2021-05-02: 150 ug via SUBCUTANEOUS
  Filled 2021-05-02: qty 0.3

## 2021-05-02 MED ORDER — SACCHAROMYCES BOULARDII 400 MG PO CAPS: 1.0000 | ORAL_CAPSULE | Freq: Two times a day (BID) | ORAL | 0 refills | Status: AC

## 2021-05-02 NOTE — Patient Instructions (Signed)
Darbepoetin Alfa injection What is this medication? DARBEPOETIN ALFA (dar be POE e tin AL fa) helps your body make more red blood cells. It is used to treat anemia caused by chronic kidney failure and chemotherapy. This medicine may be used for other purposes; ask your health care provider or pharmacist if you have questions. COMMON BRAND NAME(S): Aranesp What should I tell my care team before I take this medication? They need to know if you have any of these conditions: blood clotting disorders or history of blood clots cancer patient not on chemotherapy cystic fibrosis heart disease, such as angina, heart failure, or a history of a heart attack hemoglobin level of 12 g/dL or greater high blood pressure low levels of folate, iron, or vitamin B12 seizures an unusual or allergic reaction to darbepoetin, erythropoietin, albumin, hamster proteins, latex, other medicines, foods, dyes, or preservatives pregnant or trying to get pregnant breast-feeding How should I use this medication? This medicine is for injection into a vein or under the skin. It is usually given by a health care professional in a hospital or clinic setting. If you get this medicine at home, you will be taught how to prepare and give this medicine. Use exactly as directed. Take your medicine at regular intervals. Do not take your medicine more often than directed. It is important that you put your used needles and syringes in a special sharps container. Do not put them in a trash can. If you do not have a sharps container, call your pharmacist or healthcare provider to get one. A special MedGuide will be given to you by the pharmacist with each prescription and refill. Be sure to read this information carefully each time. Talk to your pediatrician regarding the use of this medicine in children. While this medicine may be used in children as young as 1 month of age for selected conditions, precautions do apply. Overdosage: If you  think you have taken too much of this medicine contact a poison control center or emergency room at once. NOTE: This medicine is only for you. Do not share this medicine with others. What if I miss a dose? If you miss a dose, take it as soon as you can. If it is almost time for your next dose, take only that dose. Do not take double or extra doses. What may interact with this medication? Do not take this medicine with any of the following medications: epoetin alfa This list may not describe all possible interactions. Give your health care provider a list of all the medicines, herbs, non-prescription drugs, or dietary supplements you use. Also tell them if you smoke, drink alcohol, or use illegal drugs. Some items may interact with your medicine. What should I watch for while using this medication? Your condition will be monitored carefully while you are receiving this medicine. You may need blood work done while you are taking this medicine. This medicine may cause a decrease in vitamin B6. You should make sure that you get enough vitamin B6 while you are taking this medicine. Discuss the foods you eat and the vitamins you take with your health care professional. What side effects may I notice from receiving this medication? Side effects that you should report to your doctor or health care professional as soon as possible: allergic reactions like skin rash, itching or hives, swelling of the face, lips, or tongue breathing problems changes in vision chest pain confusion, trouble speaking or understanding feeling faint or lightheaded, falls high blood pressure   muscle aches or pains pain, swelling, warmth in the leg rapid weight gain severe headaches sudden numbness or weakness of the face, arm or leg trouble walking, dizziness, loss of balance or coordination seizures (convulsions) swelling of the ankles, feet, hands unusually weak or tired Side effects that usually do not require medical  attention (report to your doctor or health care professional if they continue or are bothersome): diarrhea fever, chills (flu-like symptoms) headaches nausea, vomiting redness, stinging, or swelling at site where injected This list may not describe all possible side effects. Call your doctor for medical advice about side effects. You may report side effects to FDA at 1-800-FDA-1088. Where should I keep my medication? Keep out of the reach of children. Store in a refrigerator between 2 and 8 degrees C (36 and 46 degrees F). Do not freeze. Do not shake. Throw away any unused portion if using a single-dose vial. Throw away any unused medicine after the expiration date. NOTE: This sheet is a summary. It may not cover all possible information. If you have questions about this medicine, talk to your doctor, pharmacist, or health care provider.  2022 Elsevier/Gold Standard (2017-08-06 16:44:20)  

## 2021-05-02 NOTE — Progress Notes (Signed)
Patient was observed for 30 minutes post evusheld injection with no adverse reaction. Vitals stable and patient in no distress upon leaving infusion room. Patient ambulatory to her Xray appointment.

## 2021-05-04 ENCOUNTER — Telehealth: Payer: Self-pay | Admitting: Hematology

## 2021-05-04 DIAGNOSIS — M810 Age-related osteoporosis without current pathological fracture: Secondary | ICD-10-CM | POA: Diagnosis not present

## 2021-05-04 DIAGNOSIS — E785 Hyperlipidemia, unspecified: Secondary | ICD-10-CM | POA: Diagnosis not present

## 2021-05-04 DIAGNOSIS — E039 Hypothyroidism, unspecified: Secondary | ICD-10-CM | POA: Diagnosis not present

## 2021-05-04 NOTE — Telephone Encounter (Signed)
Scheduled per 09/28 los, patient has been called. Unable to leave voicemail. Calender will be sent.

## 2021-05-07 DIAGNOSIS — Z20828 Contact with and (suspected) exposure to other viral communicable diseases: Secondary | ICD-10-CM | POA: Diagnosis not present

## 2021-05-09 ENCOUNTER — Encounter: Payer: Self-pay | Admitting: Hematology

## 2021-05-09 NOTE — Addendum Note (Signed)
Addended by: Sullivan Lone on: 05/09/2021 03:25 AM   Modules accepted: Level of Service

## 2021-05-09 NOTE — Progress Notes (Addendum)
HEMATOLOGY/ONCOLOGY CLINIC NOTE  Date of Service: .05/02/2021   Patient Care Team: Burnard Bunting, MD as PCP - General (Internal Medicine)  REFERRING PHYSICIAN: Burnard Bunting, MD  CHIEF COMPLAINTS/PURPOSE OF CONSULTATION:  Pancytopenia  HISTORY OF PRESENTING ILLNESS:  Alison Bridges is a wonderful 87 y.o. female who has been referred to [place] by Burnard Bunting, MD for evaluation and management of Pancytopenia. The pt reports that she is doing well overall.    The pt reports she is good. Pt lives alone. Prior to the pandemic she was taking a workout class. About the last 3 months pt has had fatigue, loss of appetite, and weight loss. Last October pt had fatigue, SOB, and was spitting up blood. She has acid reflux and had esophagus dilated twice. A week ago pt was in the hospital due to infection in her right ankle. Her ankle started swelling and turning red. The ankle was not painful but did have burning and tightness. She was put on antibiotics and iron.  Pt uses partial dentures and does not use denture cream. She has been having a burning and hot feeling in feet and face that comes and goes. Her thyroid medication has been stable and she takes the pills several hours before eating with just water. Pt has loss about 10 pounds due to not having appetite. Recently she has had spontaneous bruising. She has had both doses of COVID19 vaccine. Pt had been taking 50mg  of zync for 4-6weeks prior to hospitalization.    Of note prior to the patient's visit today, pt has had DG Ankle Complete Right ([id number]) completed on 12/08/19 with results revealing "Soft tissue swelling without acute osseous finding." Pt has had Lower Venous DVT Study completed on 12/09/19 with results revealing "RIGHT: There is no evidence of deep vein thrombosis in the lower extremity. No cystic structure found in the popliteal fossa. LEFT: No evidence of common femoral vein obstruction." Pt has had CT Abdomen Pelvis W IV  Contrast completed on 10/07/19 with results revealing, " 1. No acute finding 2. Areas of enhancement in the right bladder worrisome for neoplasm 3. Cholecystectomy 4. Bilateral renal cortical cysts and nonobstructive left renal calculi. 5. Aortoiliac atherosclerosis without aneurysm. 6. Hysterectomy 7. Diverticulosis without diverticulitis. 8. Lumbar DDD and DJD with degenerative anterolisthesis of L5 on S1"   Most recent lab results (12/10/19) of CBC is as follows: all values are WNL except for WBC at 2.9K, RBC at 2.35, Hemoglobin at 8.2, HCT at 25.1, MCV at 106.8, MCH at 34.9, Platelets at 66K, Neutro Abs at 0.6K, Monocytes Absolute at 1.4K, Glucose at 105, Calcium at 8.4, GFR, Calc Non Af Amer at 54 12/08/19 of Lactic Acid, Venous at 1.3 12/08/19 of CRP at 0.8: WNL 12/08/19 of Reticulocytes is as follows: all values are WNL except for RC at 2.52, Immature Retic Fract at 16.9 12/08/19 of Ferritin at 167: WNL 12/08/19 of Iron and TIBC is as follows: all values are WNL except for Iron at 27, Saturation Ratios at 10 12/08/19 of Folate at 52.3: WNL 12/08/19 of Vitamin B12 at 931 12/08/19 of Sed Rate at 60   On review of systems, pt reports fatigue, lack of appetite, burning/hot sensation in face and feet, weight loss  and denies lumps/bumps, skin rashes, bone pain, fever, chills, night sweats, changes in bowl habits and urinary habits, nose bleeds, gum bleeds, blood in urine/stool, lightheaded, dizzy, changes in breathing, back pain, constipation and any other symptoms.   INTERVAL HISTORY:  Alison Bridges is a wonderful 87 y.o. female who is here for evaluation and management of Pancytopenia - likely from MDS +/- MPN. The patient's last visit with [place] was on 04/04/2021. The pt reports that he is doing well overall.  Patient's upper respiratory infection has resolved since her last visit.  She notes some fatigue but no overt lightheadedness or dizziness no chest pain or shortness of breath.  Lab  results today 05/02/2021 shows a hemoglobin of 8.8 with an MCV of 105.7 WBC count of 2.4k k with an ANC of 700 and a platelet count of 74k. CMP within normal limits  MEDICAL HISTORY:  Past Medical History:  Diagnosis Date   Depression    Hypothyroidism    Osteoporosis    SURGICAL HISTORY: Past Surgical History:  Procedure Laterality Date   ABDOMINAL HYSTERECTOMY     APPENDECTOMY  1998   CHOLECYSTECTOMY  1998   SOCIAL HISTORY: Social History   Socioeconomic History   Marital status: Widowed    Spouse name: Not on file   Number of children: Not on file   Years of education: Not on file   Highest education level: Not on file  Occupational History   Occupation: Oncologist    Comment: Retired  Tobacco Use   Smoking status: Never   Smokeless tobacco: Never  Substance and Sexual Activity   Alcohol use: No   Drug use: No   Sexual activity: Not on file  Other Topics Concern   Not on file  Social History Narrative   She is tried to stay active --  2 days a week she does senior aerobics.  She also enjoys going for walks, but no standard routine.      She has 3 children and 2 grandchildren.   Social Determinants of Health   Financial Resource Strain: Not on file  Food Insecurity: Not on file  Transportation Needs: Not on file  Physical Activity: Not on file  Stress: Not on file  Social Connections: Not on file  Intimate Partner Violence: Not on file     FAMILY HISTORY: Family History  Problem Relation Age of Onset   Heart attack Mother 24   Pancreatic cancer Father    Parkinson's disease Sister    Diabetes Brother        Multiple complications   Heart disease Brother        Not sure what happened   Dementia Brother      ALLERGIES:   has No Known Allergies.   MEDICATIONS:  Current Outpatient Medications  Medication Sig Dispense Refill   Ascorbic Acid (VITAMIN C PO) Take 1 tablet by mouth daily.     b complex vitamins capsule Take 1 capsule by  mouth daily.     cholecalciferol (VITAMIN D) 1000 UNITS tablet Take 1,000 Units by mouth daily.     levothyroxine (SYNTHROID, LEVOTHROID) 125 MCG tablet Take 125 mcg by mouth daily before breakfast.     Multiple Vitamin (MULTIVITAMIN WITH MINERALS) TABS tablet Take 1 tablet by mouth daily.     pantoprazole (PROTONIX) 40 MG tablet Take 40 mg by mouth as needed (acid reflux).     Saccharomyces boulardii 400 MG CAPS Take 1 capsule by mouth in the morning and at bedtime for 15 days. 30 capsule 0   sertraline (ZOLOFT) 100 MG tablet Take 100 mg by mouth daily.     polyethylene glycol (MIRALAX / GLYCOLAX) 17 g packet Take 17 g by mouth daily as needed  for mild constipation. (Patient not taking: No sig reported)     Sennosides (SENOKOT PO) Take 1 tablet by mouth at bedtime as needed. (Patient not taking: No sig reported)     No current facility-administered medications for this visit.     REVIEW OF SYSTEMS:   .Marland Kitchen10 Point review of Systems was done is negative except as noted above.   PHYSICAL EXAMINATION: ECOG PERFORMANCE STATUS: 2 - Symptomatic, <50% confined to bed  Vitals:   05/02/21 1334  BP: (!) 150/64  Pulse: 83  Resp: 18  Temp: 98.2 F (36.8 C)  SpO2: 97%   Filed Weights   05/02/21 1334  Weight: 128 lb 4.8 oz (58.2 kg)   Body mass index is 19.51 kg/m. Marland Kitchen GENERAL:alert, in no acute distress and comfortable SKIN: no acute rashes, no significant lesions EYES: conjunctiva are pink and non-injected, sclera anicteric OROPHARYNX: MMM, no exudates, no oropharyngeal erythema or ulceration NECK: supple, no JVD LYMPH:  no palpable lymphadenopathy in the cervical, axillary or inguinal regions LUNGS: clear to auscultation b/l with normal respiratory effort HEART: regular rate & rhythm ABDOMEN:  normoactive bowel sounds , non tender, not distended. Extremity: no pedal edema PSYCH: alert & oriented x 3 with fluent speech NEURO: no focal motor/sensory deficits   LABORATORY DATA:  I  have reviewed the data as listed  CBC Latest Ref Rng & Units 05/02/2021 04/04/2021 02/07/2021  WBC 4.0 - 10.5 K/uL 2.4(L) 2.0(L) 2.5(L)  Hemoglobin 12.0 - 15.0 g/dL 8.8(L) 8.3(L) 9.2(L)  Hematocrit 36.0 - 46.0 % 27.2(L) 26.1(L) 28.7(L)  Platelets 150 - 400 K/uL 74(L) 60(L) 71(L)    CMP Latest Ref Rng & Units 05/02/2021 04/04/2021 02/07/2021  Glucose 70 - 99 mg/dL 92 105(H) 104(H)  BUN 8 - 23 mg/dL 21 22 20   Creatinine 0.44 - 1.00 mg/dL 1.25(H) 1.33(H) 1.29(H)  Sodium 135 - 145 mmol/L 138 137 137  Potassium 3.5 - 5.1 mmol/L 4.1 4.5 4.8  Chloride 98 - 111 mmol/L 107 107 106  CO2 22 - 32 mmol/L 21(L) 24 24  Calcium 8.9 - 10.3 mg/dL 9.3 9.2 9.1  Total Protein 6.5 - 8.1 g/dL 7.4 7.2 7.4  Total Bilirubin 0.3 - 1.2 mg/dL 0.4 0.5 0.4  Alkaline Phos 38 - 126 U/L 93 86 94  AST 15 - 41 U/L 18 18 20   ALT 0 - 44 U/L 16 17 15    . Lab Results  Component Value Date   IRON 90 08/16/2020   TIBC 211 (L) 08/16/2020   IRONPCTSAT 43 08/16/2020   (Iron and TIBC)  Lab Results  Component Value Date   FERRITIN 1,482 (H) 08/16/2020     01/10/2020 Cytogenetics [id number]):    01/10/2020 FISH Panel ([id number]):   01/10/2020 BM Bx Report ([id number]):    12/16/2019 JAK2, MPL, CALR Panel Report:    RADIOGRAPHIC STUDIES: I have personally reviewed the radiological images as listed and agreed with the findings in the report. DG Chest 2 View  Result Date: 05/02/2021 CLINICAL DATA:  COVID 19 2 months ago, worsening cough EXAM: CHEST - 2 VIEW COMPARISON:  03/22/2020 FINDINGS: Frontal and lateral views of the chest demonstrate an unremarkable cardiac silhouette. No acute airspace disease, effusion, or pneumothorax. Stable atherosclerosis. No acute bony abnormalities. IMPRESSION: 1. No acute intrathoracic process. Electronically Signed   By: Randa Ngo M.D.   On: 05/02/2021 16:38     ASSESSMENT & PLAN:   Alison Bridges is a 87 y.o. female with:   Pancytopenia - likely from MDS +/-  MPN -01/10/2020 BM Bx Surgical Pathology Report ([id number]) revealed "BONE MARROW, ASPIRATE, CLOT, CORE: -Hypercellular marrow with myeloid hyperplasia and megakaryocytic atypia  PERIPHERAL BLOOD: -  Pancytopenia". -12/16/19 JAK2 (including V617 and Exon 12), MPL, and CALR-Next Generation Sequencing shows "Tier II: Variants of Potential Clinical Significance - SRSF2 p.Pro95His" 2. Bladder lesion -- patient following with urology  PLAN: -Lab results today 05/02/2021 shows a hemoglobin of 8.8 with an MCV of 105.7 WBC count of 2.4k k with an ANC of 700 and a platelet count of 74k. CMP within normal limits -No indication of PRBC transfusion at this time -We will continue same dose of Aranesp.. -Advised pt there is no need for G-CSF at this time due no severe infection issues. Also discussed pros and cons of prophylactic antimicrobials..holding for now. -Continue monthly 150 mg Aranesp at the lowest possible dose for goal Hgb ? 10. -Continue daily B-complex vitamin -Will see back 3 months with labs. -mild diarrhea -- probiotics prescribed  FOLLOW UP: Evusheld today CXR today Please schedule next 4 months of monthly Aranesp with labs Return to clinic with Dr. Irene Limbo in 3 months  . The total time spent in the appointment was 30 minutes and more than 50% was on counseling and direct patient cares.   All of the patient's questions were answered with apparent satisfaction. The patient knows to call the clinic with any problems, questions or concerns.   Sullivan Lone MD MS AAHIVMS [facility] Hematology/Oncology Physician [place]   ADDENDUM  CXR -- NAI

## 2021-05-12 DIAGNOSIS — Z23 Encounter for immunization: Secondary | ICD-10-CM | POA: Diagnosis not present

## 2021-05-28 DIAGNOSIS — Z8616 Personal history of COVID-19: Secondary | ICD-10-CM | POA: Diagnosis not present

## 2021-05-30 ENCOUNTER — Other Ambulatory Visit: Payer: Self-pay

## 2021-05-30 ENCOUNTER — Inpatient Hospital Stay: Payer: Medicare Other | Attending: Hematology

## 2021-05-30 ENCOUNTER — Inpatient Hospital Stay: Payer: Medicare Other

## 2021-05-30 VITALS — BP 111/62 | HR 82 | Temp 98.5°F | Resp 16

## 2021-05-30 DIAGNOSIS — D469 Myelodysplastic syndrome, unspecified: Secondary | ICD-10-CM

## 2021-05-30 DIAGNOSIS — D46Z Other myelodysplastic syndromes: Secondary | ICD-10-CM | POA: Diagnosis not present

## 2021-05-30 DIAGNOSIS — D649 Anemia, unspecified: Secondary | ICD-10-CM

## 2021-05-30 LAB — CBC WITH DIFFERENTIAL (CANCER CENTER ONLY)
Abs Immature Granulocytes: 0.06 10*3/uL (ref 0.00–0.07)
Basophils Absolute: 0 10*3/uL (ref 0.0–0.1)
Basophils Relative: 0 %
Eosinophils Absolute: 0 10*3/uL (ref 0.0–0.5)
Eosinophils Relative: 0 %
HCT: 28.3 % — ABNORMAL LOW (ref 36.0–46.0)
Hemoglobin: 9.2 g/dL — ABNORMAL LOW (ref 12.0–15.0)
Immature Granulocytes: 2 %
Lymphocytes Relative: 42 %
Lymphs Abs: 1.1 10*3/uL (ref 0.7–4.0)
MCH: 34.1 pg — ABNORMAL HIGH (ref 26.0–34.0)
MCHC: 32.5 g/dL (ref 30.0–36.0)
MCV: 104.8 fL — ABNORMAL HIGH (ref 80.0–100.0)
Monocytes Absolute: 0.9 10*3/uL (ref 0.1–1.0)
Monocytes Relative: 37 %
Neutro Abs: 0.5 10*3/uL — ABNORMAL LOW (ref 1.7–7.7)
Neutrophils Relative %: 19 %
Platelet Count: 77 10*3/uL — ABNORMAL LOW (ref 150–400)
RBC: 2.7 MIL/uL — ABNORMAL LOW (ref 3.87–5.11)
RDW: 15.7 % — ABNORMAL HIGH (ref 11.5–15.5)
WBC Count: 2.5 10*3/uL — ABNORMAL LOW (ref 4.0–10.5)
nRBC: 0 % (ref 0.0–0.2)

## 2021-05-30 LAB — CMP (CANCER CENTER ONLY)
ALT: 14 U/L (ref 0–44)
AST: 17 U/L (ref 15–41)
Albumin: 4 g/dL (ref 3.5–5.0)
Alkaline Phosphatase: 84 U/L (ref 38–126)
Anion gap: 9 (ref 5–15)
BUN: 21 mg/dL (ref 8–23)
CO2: 23 mmol/L (ref 22–32)
Calcium: 9.4 mg/dL (ref 8.9–10.3)
Chloride: 106 mmol/L (ref 98–111)
Creatinine: 1.39 mg/dL — ABNORMAL HIGH (ref 0.44–1.00)
GFR, Estimated: 36 mL/min — ABNORMAL LOW (ref >60)
Glucose, Bld: 94 mg/dL (ref 70–99)
Potassium: 4.4 mmol/L (ref 3.5–5.1)
Sodium: 138 mmol/L (ref 135–145)
Total Bilirubin: 0.5 mg/dL (ref 0.3–1.2)
Total Protein: 7.5 g/dL (ref 6.5–8.1)

## 2021-05-30 MED ORDER — DARBEPOETIN ALFA 150 MCG/0.3ML IJ SOSY
150.0000 ug | PREFILLED_SYRINGE | Freq: Once | INTRAMUSCULAR | Status: AC
  Administered 2021-05-30: 150 ug via SUBCUTANEOUS
  Filled 2021-05-30: qty 0.3

## 2021-06-04 DIAGNOSIS — M810 Age-related osteoporosis without current pathological fracture: Secondary | ICD-10-CM | POA: Diagnosis not present

## 2021-06-04 DIAGNOSIS — M1990 Unspecified osteoarthritis, unspecified site: Secondary | ICD-10-CM | POA: Diagnosis not present

## 2021-06-04 DIAGNOSIS — Z20828 Contact with and (suspected) exposure to other viral communicable diseases: Secondary | ICD-10-CM | POA: Diagnosis not present

## 2021-06-04 DIAGNOSIS — E785 Hyperlipidemia, unspecified: Secondary | ICD-10-CM | POA: Diagnosis not present

## 2021-06-04 DIAGNOSIS — E039 Hypothyroidism, unspecified: Secondary | ICD-10-CM | POA: Diagnosis not present

## 2021-06-11 DIAGNOSIS — Z20822 Contact with and (suspected) exposure to covid-19: Secondary | ICD-10-CM | POA: Diagnosis not present

## 2021-06-14 DIAGNOSIS — Z23 Encounter for immunization: Secondary | ICD-10-CM | POA: Diagnosis not present

## 2021-06-18 DIAGNOSIS — Z20822 Contact with and (suspected) exposure to covid-19: Secondary | ICD-10-CM | POA: Diagnosis not present

## 2021-06-25 DIAGNOSIS — Z20822 Contact with and (suspected) exposure to covid-19: Secondary | ICD-10-CM | POA: Diagnosis not present

## 2021-06-27 ENCOUNTER — Inpatient Hospital Stay: Payer: Medicare Other

## 2021-06-27 ENCOUNTER — Other Ambulatory Visit: Payer: Self-pay

## 2021-06-27 ENCOUNTER — Inpatient Hospital Stay: Payer: Medicare Other | Attending: Hematology

## 2021-06-27 VITALS — BP 142/60 | HR 75 | Temp 98.0°F | Resp 16

## 2021-06-27 DIAGNOSIS — D46Z Other myelodysplastic syndromes: Secondary | ICD-10-CM | POA: Insufficient documentation

## 2021-06-27 DIAGNOSIS — D469 Myelodysplastic syndrome, unspecified: Secondary | ICD-10-CM

## 2021-06-27 DIAGNOSIS — D649 Anemia, unspecified: Secondary | ICD-10-CM

## 2021-06-27 LAB — CMP (CANCER CENTER ONLY)
ALT: 15 U/L (ref 0–44)
AST: 19 U/L (ref 15–41)
Albumin: 3.9 g/dL (ref 3.5–5.0)
Alkaline Phosphatase: 87 U/L (ref 38–126)
Anion gap: 7 (ref 5–15)
BUN: 24 mg/dL — ABNORMAL HIGH (ref 8–23)
CO2: 23 mmol/L (ref 22–32)
Calcium: 8.9 mg/dL (ref 8.9–10.3)
Chloride: 106 mmol/L (ref 98–111)
Creatinine: 1.28 mg/dL — ABNORMAL HIGH (ref 0.44–1.00)
GFR, Estimated: 40 mL/min — ABNORMAL LOW (ref >60)
Glucose, Bld: 79 mg/dL (ref 70–99)
Potassium: 4 mmol/L (ref 3.5–5.1)
Sodium: 136 mmol/L (ref 135–145)
Total Bilirubin: 0.4 mg/dL (ref 0.3–1.2)
Total Protein: 7.3 g/dL (ref 6.5–8.1)

## 2021-06-27 LAB — CBC WITH DIFFERENTIAL (CANCER CENTER ONLY)
Abs Immature Granulocytes: 0.05 10*3/uL (ref 0.00–0.07)
Basophils Absolute: 0 10*3/uL (ref 0.0–0.1)
Basophils Relative: 0 %
Eosinophils Absolute: 0 10*3/uL (ref 0.0–0.5)
Eosinophils Relative: 0 %
HCT: 29.4 % — ABNORMAL LOW (ref 36.0–46.0)
Hemoglobin: 9.3 g/dL — ABNORMAL LOW (ref 12.0–15.0)
Immature Granulocytes: 2 %
Lymphocytes Relative: 47 %
Lymphs Abs: 1.3 10*3/uL (ref 0.7–4.0)
MCH: 33.5 pg (ref 26.0–34.0)
MCHC: 31.6 g/dL (ref 30.0–36.0)
MCV: 105.8 fL — ABNORMAL HIGH (ref 80.0–100.0)
Monocytes Absolute: 0.9 10*3/uL (ref 0.1–1.0)
Monocytes Relative: 34 %
Neutro Abs: 0.5 10*3/uL — ABNORMAL LOW (ref 1.7–7.7)
Neutrophils Relative %: 17 %
Platelet Count: 70 10*3/uL — ABNORMAL LOW (ref 150–400)
RBC: 2.78 MIL/uL — ABNORMAL LOW (ref 3.87–5.11)
RDW: 15.7 % — ABNORMAL HIGH (ref 11.5–15.5)
WBC Count: 2.7 10*3/uL — ABNORMAL LOW (ref 4.0–10.5)
nRBC: 0 % (ref 0.0–0.2)

## 2021-06-27 LAB — GENETIC SCREENING ORDER

## 2021-06-27 MED ORDER — DARBEPOETIN ALFA 150 MCG/0.3ML IJ SOSY
150.0000 ug | PREFILLED_SYRINGE | Freq: Once | INTRAMUSCULAR | Status: AC
  Administered 2021-06-27: 150 ug via SUBCUTANEOUS
  Filled 2021-06-27: qty 0.3

## 2021-07-02 DIAGNOSIS — Z20828 Contact with and (suspected) exposure to other viral communicable diseases: Secondary | ICD-10-CM | POA: Diagnosis not present

## 2021-07-02 DIAGNOSIS — Z1159 Encounter for screening for other viral diseases: Secondary | ICD-10-CM | POA: Diagnosis not present

## 2021-07-04 DIAGNOSIS — E039 Hypothyroidism, unspecified: Secondary | ICD-10-CM | POA: Diagnosis not present

## 2021-07-04 DIAGNOSIS — M1990 Unspecified osteoarthritis, unspecified site: Secondary | ICD-10-CM | POA: Diagnosis not present

## 2021-07-04 DIAGNOSIS — E785 Hyperlipidemia, unspecified: Secondary | ICD-10-CM | POA: Diagnosis not present

## 2021-07-04 DIAGNOSIS — M810 Age-related osteoporosis without current pathological fracture: Secondary | ICD-10-CM | POA: Diagnosis not present

## 2021-07-09 DIAGNOSIS — Z20828 Contact with and (suspected) exposure to other viral communicable diseases: Secondary | ICD-10-CM | POA: Diagnosis not present

## 2021-07-09 DIAGNOSIS — Z1159 Encounter for screening for other viral diseases: Secondary | ICD-10-CM | POA: Diagnosis not present

## 2021-07-18 ENCOUNTER — Inpatient Hospital Stay (HOSPITAL_BASED_OUTPATIENT_CLINIC_OR_DEPARTMENT_OTHER): Payer: Medicare Other | Admitting: Hematology

## 2021-07-18 ENCOUNTER — Other Ambulatory Visit: Payer: Self-pay

## 2021-07-18 ENCOUNTER — Inpatient Hospital Stay: Payer: Medicare Other

## 2021-07-18 ENCOUNTER — Inpatient Hospital Stay: Payer: Medicare Other | Attending: Hematology

## 2021-07-18 VITALS — BP 136/68 | HR 88 | Temp 97.8°F | Resp 16 | Ht 68.0 in | Wt 129.5 lb

## 2021-07-18 DIAGNOSIS — Z1159 Encounter for screening for other viral diseases: Secondary | ICD-10-CM | POA: Diagnosis not present

## 2021-07-18 DIAGNOSIS — D61818 Other pancytopenia: Secondary | ICD-10-CM | POA: Diagnosis not present

## 2021-07-18 DIAGNOSIS — D469 Myelodysplastic syndrome, unspecified: Secondary | ICD-10-CM

## 2021-07-18 DIAGNOSIS — D649 Anemia, unspecified: Secondary | ICD-10-CM

## 2021-07-18 DIAGNOSIS — Z20828 Contact with and (suspected) exposure to other viral communicable diseases: Secondary | ICD-10-CM | POA: Diagnosis not present

## 2021-07-18 DIAGNOSIS — D46Z Other myelodysplastic syndromes: Secondary | ICD-10-CM | POA: Diagnosis not present

## 2021-07-18 LAB — CBC WITH DIFFERENTIAL (CANCER CENTER ONLY)
Abs Immature Granulocytes: 0.07 10*3/uL (ref 0.00–0.07)
Basophils Absolute: 0 10*3/uL (ref 0.0–0.1)
Basophils Relative: 0 %
Eosinophils Absolute: 0 10*3/uL (ref 0.0–0.5)
Eosinophils Relative: 0 %
HCT: 28.4 % — ABNORMAL LOW (ref 36.0–46.0)
Hemoglobin: 9.3 g/dL — ABNORMAL LOW (ref 12.0–15.0)
Immature Granulocytes: 3 %
Lymphocytes Relative: 36 %
Lymphs Abs: 1 10*3/uL (ref 0.7–4.0)
MCH: 34.2 pg — ABNORMAL HIGH (ref 26.0–34.0)
MCHC: 32.7 g/dL (ref 30.0–36.0)
MCV: 104.4 fL — ABNORMAL HIGH (ref 80.0–100.0)
Monocytes Absolute: 0.9 10*3/uL (ref 0.1–1.0)
Monocytes Relative: 35 %
Neutro Abs: 0.7 10*3/uL — ABNORMAL LOW (ref 1.7–7.7)
Neutrophils Relative %: 26 %
Platelet Count: 62 10*3/uL — ABNORMAL LOW (ref 150–400)
RBC: 2.72 MIL/uL — ABNORMAL LOW (ref 3.87–5.11)
RDW: 17 % — ABNORMAL HIGH (ref 11.5–15.5)
WBC Count: 2.6 10*3/uL — ABNORMAL LOW (ref 4.0–10.5)
nRBC: 0 % (ref 0.0–0.2)

## 2021-07-18 LAB — CMP (CANCER CENTER ONLY)
ALT: 14 U/L (ref 0–44)
AST: 18 U/L (ref 15–41)
Albumin: 3.9 g/dL (ref 3.5–5.0)
Alkaline Phosphatase: 82 U/L (ref 38–126)
Anion gap: 8 (ref 5–15)
BUN: 24 mg/dL — ABNORMAL HIGH (ref 8–23)
CO2: 22 mmol/L (ref 22–32)
Calcium: 8.7 mg/dL — ABNORMAL LOW (ref 8.9–10.3)
Chloride: 107 mmol/L (ref 98–111)
Creatinine: 1.43 mg/dL — ABNORMAL HIGH (ref 0.44–1.00)
GFR, Estimated: 35 mL/min — ABNORMAL LOW (ref >60)
Glucose, Bld: 84 mg/dL (ref 70–99)
Potassium: 4.3 mmol/L (ref 3.5–5.1)
Sodium: 137 mmol/L (ref 135–145)
Total Bilirubin: 0.5 mg/dL (ref 0.3–1.2)
Total Protein: 7.1 g/dL (ref 6.5–8.1)

## 2021-07-18 MED ORDER — DARBEPOETIN ALFA 150 MCG/0.3ML IJ SOSY
150.0000 ug | PREFILLED_SYRINGE | Freq: Once | INTRAMUSCULAR | Status: AC
  Administered 2021-07-18: 16:00:00 150 ug via SUBCUTANEOUS
  Filled 2021-07-18: qty 0.3

## 2021-07-18 NOTE — Progress Notes (Signed)
Last Aranesp dose given on 06/27/21. Per Dr Irene Limbo ok to give pt Arenesp today.

## 2021-07-20 ENCOUNTER — Telehealth: Payer: Self-pay | Admitting: Hematology

## 2021-07-20 NOTE — Telephone Encounter (Signed)
Scheduled follow-up appointments per 12/14 los. Patient is aware.

## 2021-07-23 DIAGNOSIS — Z20828 Contact with and (suspected) exposure to other viral communicable diseases: Secondary | ICD-10-CM | POA: Diagnosis not present

## 2021-07-23 DIAGNOSIS — Z1159 Encounter for screening for other viral diseases: Secondary | ICD-10-CM | POA: Diagnosis not present

## 2021-07-25 ENCOUNTER — Other Ambulatory Visit: Payer: Medicare Other

## 2021-07-25 ENCOUNTER — Ambulatory Visit: Payer: Medicare Other

## 2021-07-25 DIAGNOSIS — Z20828 Contact with and (suspected) exposure to other viral communicable diseases: Secondary | ICD-10-CM | POA: Diagnosis not present

## 2021-07-25 DIAGNOSIS — Z1159 Encounter for screening for other viral diseases: Secondary | ICD-10-CM | POA: Diagnosis not present

## 2021-07-30 DIAGNOSIS — Z1159 Encounter for screening for other viral diseases: Secondary | ICD-10-CM | POA: Diagnosis not present

## 2021-07-30 DIAGNOSIS — Z20828 Contact with and (suspected) exposure to other viral communicable diseases: Secondary | ICD-10-CM | POA: Diagnosis not present

## 2021-08-01 DIAGNOSIS — Z1159 Encounter for screening for other viral diseases: Secondary | ICD-10-CM | POA: Diagnosis not present

## 2021-08-01 DIAGNOSIS — Z20828 Contact with and (suspected) exposure to other viral communicable diseases: Secondary | ICD-10-CM | POA: Diagnosis not present

## 2021-08-03 NOTE — Progress Notes (Signed)
Other myelodysplastic syndrome (MDS type)

## 2021-08-06 DIAGNOSIS — Z20822 Contact with and (suspected) exposure to covid-19: Secondary | ICD-10-CM | POA: Diagnosis not present

## 2021-08-08 ENCOUNTER — Encounter: Payer: Self-pay | Admitting: Hematology

## 2021-08-08 DIAGNOSIS — Z20828 Contact with and (suspected) exposure to other viral communicable diseases: Secondary | ICD-10-CM | POA: Diagnosis not present

## 2021-08-08 DIAGNOSIS — Z1159 Encounter for screening for other viral diseases: Secondary | ICD-10-CM | POA: Diagnosis not present

## 2021-08-08 NOTE — Progress Notes (Signed)
HEMATOLOGY/ONCOLOGY CLINIC NOTE  Date of Service: .07/18/2021   Patient Care Team: Burnard Bunting, MD as PCP - General (Internal Medicine)  REFERRING PHYSICIAN: Burnard Bunting, MD  CHIEF COMPLAINTS/PURPOSE OF CONSULTATION:  Follow-up for continued management of pancytopenia from MDS  HISTORY OF PRESENTING ILLNESS: Please see previous notes for details on initial presentation  INTERVAL HISTORY:   Alison Bridges is a wonderful 88 y.o. 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 She notes some chronic fatigue but no other acute new symptoms.  No issues with bleeding.  No lightheadedness or dizziness.  No chest pain. No new infection issues since her last clinic visit. No primary toxicities from her Aranesp. Labs today 07/18/2021 show hemoglobin of 9.3 with an MCV of 104, platelets of 62k WBC count of 2.6k with ANC of 700.  MEDICAL HISTORY:  Past Medical History:  Diagnosis Date   Depression    Hypothyroidism    Osteoporosis    SURGICAL HISTORY: Past Surgical History:  Procedure Laterality Date   ABDOMINAL HYSTERECTOMY     APPENDECTOMY  1998   CHOLECYSTECTOMY  1998   SOCIAL HISTORY: Social History   Socioeconomic History   Marital status: Widowed    Spouse name: Not on file   Number of children: Not on file   Years of education: Not on file   Highest education level: Not on file  Occupational History   Occupation: Oncologist    Comment: Retired  Tobacco Use   Smoking status: Never   Smokeless tobacco: Never  Substance and Sexual Activity   Alcohol use: No   Drug use: No   Sexual activity: Not on file  Other Topics Concern   Not on file  Social History Narrative   She is tried to stay active --  2 days a week she does senior aerobics.  She also enjoys going for walks, but no standard routine.      She has 3 children and 2 grandchildren.    Social Determinants of Health   Financial Resource Strain: Not on file  Food Insecurity: Not on file  Transportation Needs: Not on file  Physical Activity: Not on file  Stress: Not on file  Social Connections: Not on file  Intimate Partner Violence: Not on file     FAMILY HISTORY: Family History  Problem Relation Age of Onset   Heart attack Mother 56   Pancreatic cancer Father    Parkinson's disease Sister    Diabetes Brother        Multiple complications   Heart disease Brother        Not sure what happened   Dementia Brother      ALLERGIES:   has No Known Allergies.   MEDICATIONS:  Current Outpatient Medications  Medication Sig Dispense Refill   Ascorbic Acid (VITAMIN C PO) Take 1 tablet by mouth daily.     Azelastine HCl 137 MCG/SPRAY SOLN Place into both nostrils.     b complex vitamins capsule Take 1 capsule by mouth daily.     cholecalciferol (VITAMIN D) 1000 UNITS tablet Take 1,000 Units by mouth daily.     levothyroxine (SYNTHROID, LEVOTHROID) 125 MCG tablet Take 125 mcg by mouth daily before breakfast.     Multiple Vitamin (MULTIVITAMIN WITH MINERALS) TABS tablet Take 1 tablet by mouth daily.     pantoprazole (PROTONIX) 40 MG tablet Take 40 mg by mouth  as needed (acid reflux).     sertraline (ZOLOFT) 100 MG tablet Take 100 mg by mouth daily.     polyethylene glycol (MIRALAX / GLYCOLAX) 17 g packet Take 17 g by mouth daily as needed for mild constipation. (Patient not taking: Reported on 05/17/2020)     Sennosides (SENOKOT PO) Take 1 tablet by mouth at bedtime as needed. (Patient not taking: Reported on 12/13/2020)     No current facility-administered medications for this visit.     REVIEW OF SYSTEMS:   .10 Point review of Systems was done is negative except as noted above.    PHYSICAL EXAMINATION: ECOG PERFORMANCE STATUS: 2 - Symptomatic, <50% confined to bed  Vitals:   07/18/21 1411  BP: 136/68  Pulse: 88  Resp: 16  Temp: 97.8 F (36.6 C)   SpO2: 96%   Filed Weights   07/18/21 1411  Weight: 129 lb 8 oz (58.7 kg)   Body mass index is 19.69 kg/m. Marland Kitchen GENERAL:alert, in no acute distress and comfortable SKIN: no acute rashes, no significant lesions EYES: conjunctiva are pink and non-injected, sclera anicteric OROPHARYNX: MMM, no exudates, no oropharyngeal erythema or ulceration NECK: supple, no JVD LYMPH:  no palpable lymphadenopathy in the cervical, axillary or inguinal regions LUNGS: clear to auscultation b/l with normal respiratory effort HEART: regular rate & rhythm ABDOMEN:  normoactive bowel sounds , non tender, not distended. Extremity: no pedal edema PSYCH: alert & oriented x 3 with fluent speech NEURO: no focal motor/sensory deficits  LABORATORY DATA:  I have reviewed the data as listed  CBC Latest Ref Rng & Units 07/18/2021 06/27/2021 05/30/2021  WBC 4.0 - 10.5 K/uL 2.6(L) 2.7(L) 2.5(L)  Hemoglobin 12.0 - 15.0 g/dL 9.3(L) 9.3(L) 9.2(L)  Hematocrit 36.0 - 46.0 % 28.4(L) 29.4(L) 28.3(L)  Platelets 150 - 400 K/uL 62(L) 70(L) 77(L)  ANC 700  CMP Latest Ref Rng & Units 07/18/2021 06/27/2021 05/30/2021  Glucose 70 - 99 mg/dL 84 79 94  BUN 8 - 23 mg/dL 24(H) 24(H) 21  Creatinine 0.44 - 1.00 mg/dL 1.43(H) 1.28(H) 1.39(H)  Sodium 135 - 145 mmol/L 137 136 138  Potassium 3.5 - 5.1 mmol/L 4.3 4.0 4.4  Chloride 98 - 111 mmol/L 107 106 106  CO2 22 - 32 mmol/L 22 23 23   Calcium 8.9 - 10.3 mg/dL 8.7(L) 8.9 9.4  Total Protein 6.5 - 8.1 g/dL 7.1 7.3 7.5  Total Bilirubin 0.3 - 1.2 mg/dL 0.5 0.4 0.5  Alkaline Phos 38 - 126 U/L 82 87 84  AST 15 - 41 U/L 18 19 17   ALT 0 - 44 U/L 14 15 14    . Lab Results  Component Value Date   IRON 90 08/16/2020   TIBC 211 (L) 08/16/2020   IRONPCTSAT 43 08/16/2020   (Iron and TIBC)  Lab Results  Component Value Date   FERRITIN 1,482 (H) 08/16/2020     01/10/2020 Cytogenetics [phone]):    01/10/2020 FISH Panel ([id number]):   01/10/2020 BM Bx Report  ([id number]):    12/16/2019 JAK2, MPL, CALR Panel Report:    RADIOGRAPHIC STUDIES: I have personally reviewed the radiological images as listed and agreed with the findings in the report. No results found.   ASSESSMENT & PLAN:   Alison Bridges is a 88 y.o. female with:   Pancytopenia - likely from MDS +/- MPN -01/10/2020 BM Bx Surgical Pathology Report ([id number]) revealed "BONE MARROW, ASPIRATE, CLOT, CORE: -Hypercellular marrow with myeloid hyperplasia and megakaryocytic atypia  PERIPHERAL BLOOD: -  Pancytopenia". -12/16/19 JAK2 (  including V617 and Exon 12), MPL, and CALR-Next Generation Sequencing shows "Tier II: Variants of Potential Clinical Significance - SRSF2 p.Pro95His"  2.  Hepatosplenomegaly likely from her MDS/MPN noted on CT abdomen pelvis January 2022  PLAN: -Labs done today 07/18/2021 were discussed in detail. -Level is stable at 9.3.  No indication for PRBC transfusion at this time. -We shall continue her same dose of Aranesp at 150 mg every 4 weeks. dose for goal Hgb ? 10. -Platelets at 62k no issues with bleeding. -Avoid NSAIDs or other medications that can worsen thrombocytopenia. -Continue daily vitamin B complex -Stable chronic leukopenia with neutropenia ANC 700.  No issues with infections.  No role for Granix except if the patient develops significant infection or needs unavoidable surgical procedures. -Neutropenic precautions recommended  FOLLOW UP: Please schedule next 4 months of monthly Aranesp with labs Return to clinic with Dr. Irene Limbo in 3 months  All of the patient's questions were answered with apparent satisfaction. The patient knows to call the clinic with any problems, questions or concerns.   Sullivan Lone MD [place] [facility]IV[facility] Hematology/Oncology Physician [facility]

## 2021-08-20 DIAGNOSIS — G629 Polyneuropathy, unspecified: Secondary | ICD-10-CM | POA: Diagnosis not present

## 2021-08-20 DIAGNOSIS — M810 Age-related osteoporosis without current pathological fracture: Secondary | ICD-10-CM | POA: Diagnosis not present

## 2021-08-20 DIAGNOSIS — E039 Hypothyroidism, unspecified: Secondary | ICD-10-CM | POA: Diagnosis not present

## 2021-08-20 DIAGNOSIS — K2289 Other specified disease of esophagus: Secondary | ICD-10-CM | POA: Diagnosis not present

## 2021-08-20 DIAGNOSIS — N200 Calculus of kidney: Secondary | ICD-10-CM | POA: Diagnosis not present

## 2021-08-20 DIAGNOSIS — N281 Cyst of kidney, acquired: Secondary | ICD-10-CM | POA: Diagnosis not present

## 2021-08-20 DIAGNOSIS — D649 Anemia, unspecified: Secondary | ICD-10-CM | POA: Diagnosis not present

## 2021-08-20 DIAGNOSIS — R140 Abdominal distension (gaseous): Secondary | ICD-10-CM | POA: Diagnosis not present

## 2021-08-20 DIAGNOSIS — K219 Gastro-esophageal reflux disease without esophagitis: Secondary | ICD-10-CM | POA: Diagnosis not present

## 2021-08-20 DIAGNOSIS — D469 Myelodysplastic syndrome, unspecified: Secondary | ICD-10-CM | POA: Diagnosis not present

## 2021-08-20 DIAGNOSIS — Z20822 Contact with and (suspected) exposure to covid-19: Secondary | ICD-10-CM | POA: Diagnosis not present

## 2021-08-21 ENCOUNTER — Other Ambulatory Visit: Payer: Self-pay

## 2021-08-21 DIAGNOSIS — D469 Myelodysplastic syndrome, unspecified: Secondary | ICD-10-CM

## 2021-08-22 ENCOUNTER — Other Ambulatory Visit: Payer: Self-pay

## 2021-08-22 ENCOUNTER — Inpatient Hospital Stay: Payer: Medicare Other

## 2021-08-22 ENCOUNTER — Inpatient Hospital Stay: Payer: Medicare Other | Attending: Hematology

## 2021-08-22 VITALS — BP 109/52 | HR 93 | Temp 98.5°F | Resp 16

## 2021-08-22 DIAGNOSIS — D46Z Other myelodysplastic syndromes: Secondary | ICD-10-CM | POA: Insufficient documentation

## 2021-08-22 DIAGNOSIS — D469 Myelodysplastic syndrome, unspecified: Secondary | ICD-10-CM

## 2021-08-22 DIAGNOSIS — D649 Anemia, unspecified: Secondary | ICD-10-CM

## 2021-08-22 LAB — CBC WITH DIFFERENTIAL (CANCER CENTER ONLY)
Abs Immature Granulocytes: 0.06 10*3/uL (ref 0.00–0.07)
Basophils Absolute: 0 10*3/uL (ref 0.0–0.1)
Basophils Relative: 0 %
Eosinophils Absolute: 0 10*3/uL (ref 0.0–0.5)
Eosinophils Relative: 0 %
HCT: 27.9 % — ABNORMAL LOW (ref 36.0–46.0)
Hemoglobin: 8.9 g/dL — ABNORMAL LOW (ref 12.0–15.0)
Immature Granulocytes: 2 %
Lymphocytes Relative: 26 %
Lymphs Abs: 0.9 10*3/uL (ref 0.7–4.0)
MCH: 33 pg (ref 26.0–34.0)
MCHC: 31.9 g/dL (ref 30.0–36.0)
MCV: 103.3 fL — ABNORMAL HIGH (ref 80.0–100.0)
Monocytes Absolute: 1.6 10*3/uL — ABNORMAL HIGH (ref 0.1–1.0)
Monocytes Relative: 45 %
Neutro Abs: 0.9 10*3/uL — ABNORMAL LOW (ref 1.7–7.7)
Neutrophils Relative %: 27 %
Platelet Count: 70 10*3/uL — ABNORMAL LOW (ref 150–400)
RBC: 2.7 MIL/uL — ABNORMAL LOW (ref 3.87–5.11)
RDW: 16.1 % — ABNORMAL HIGH (ref 11.5–15.5)
WBC Count: 3.5 10*3/uL — ABNORMAL LOW (ref 4.0–10.5)
nRBC: 0 % (ref 0.0–0.2)

## 2021-08-22 LAB — CMP (CANCER CENTER ONLY)
ALT: 14 U/L (ref 0–44)
AST: 16 U/L (ref 15–41)
Albumin: 4.1 g/dL (ref 3.5–5.0)
Alkaline Phosphatase: 84 U/L (ref 38–126)
Anion gap: 6 (ref 5–15)
BUN: 25 mg/dL — ABNORMAL HIGH (ref 8–23)
CO2: 26 mmol/L (ref 22–32)
Calcium: 9.1 mg/dL (ref 8.9–10.3)
Chloride: 104 mmol/L (ref 98–111)
Creatinine: 1.37 mg/dL — ABNORMAL HIGH (ref 0.44–1.00)
GFR, Estimated: 37 mL/min — ABNORMAL LOW (ref >60)
Glucose, Bld: 102 mg/dL — ABNORMAL HIGH (ref 70–99)
Potassium: 4 mmol/L (ref 3.5–5.1)
Sodium: 136 mmol/L (ref 135–145)
Total Bilirubin: 0.4 mg/dL (ref 0.3–1.2)
Total Protein: 7.1 g/dL (ref 6.5–8.1)

## 2021-08-22 MED ORDER — DARBEPOETIN ALFA 150 MCG/0.3ML IJ SOSY
150.0000 ug | PREFILLED_SYRINGE | Freq: Once | INTRAMUSCULAR | Status: AC
  Administered 2021-08-22: 150 ug via SUBCUTANEOUS
  Filled 2021-08-22: qty 0.3

## 2021-08-22 NOTE — Patient Instructions (Signed)
Darbepoetin Alfa injection ?What is this medication? ?DARBEPOETIN ALFA (dar be POE e tin  AL fa) helps your body make more red blood cells. It is used to treat anemia caused by chronic kidney failure and chemotherapy. ?This medicine may be used for other purposes; ask your health care provider or pharmacist if you have questions. ?COMMON BRAND NAME(S): Aranesp ?What should I tell my care team before I take this medication? ?They need to know if you have any of these conditions: ?blood clotting disorders or history of blood clots ?cancer patient not on chemotherapy ?cystic fibrosis ?heart disease, such as angina, heart failure, or a history of a heart attack ?hemoglobin level of 12 g/dL or greater ?high blood pressure ?low levels of folate, iron, or vitamin B12 ?seizures ?an unusual or allergic reaction to darbepoetin, erythropoietin, albumin, hamster proteins, latex, other medicines, foods, dyes, or preservatives ?pregnant or trying to get pregnant ?breast-feeding ?How should I use this medication? ?This medicine is for injection into a vein or under the skin. It is usually given by a health care professional in a hospital or clinic setting. ?If you get this medicine at home, you will be taught how to prepare and give this medicine. Use exactly as directed. Take your medicine at regular intervals. Do not take your medicine more often than directed. ?It is important that you put your used needles and syringes in a special sharps container. Do not put them in a trash can. If you do not have a sharps container, call your pharmacist or healthcare provider to get one. ?A special MedGuide will be given to you by the pharmacist with each prescription and refill. Be sure to read this information carefully each time. ?Talk to your pediatrician regarding the use of this medicine in children. While this medicine may be used in children as young as 1 month of age for selected conditions, precautions do apply. ?Overdosage: If  you think you have taken too much of this medicine contact a poison control center or emergency room at once. ?NOTE: This medicine is only for you. Do not share this medicine with others. ?What if I miss a dose? ?If you miss a dose, take it as soon as you can. If it is almost time for your next dose, take only that dose. Do not take double or extra doses. ?What may interact with this medication? ?Do not take this medicine with any of the following medications: ?epoetin alfa ?This list may not describe all possible interactions. Give your health care provider a list of all the medicines, herbs, non-prescription drugs, or dietary supplements you use. Also tell them if you smoke, drink alcohol, or use illegal drugs. Some items may interact with your medicine. ?What should I watch for while using this medication? ?Your condition will be monitored carefully while you are receiving this medicine. ?You may need blood work done while you are taking this medicine. ?This medicine may cause a decrease in vitamin B6. You should make sure that you get enough vitamin B6 while you are taking this medicine. Discuss the foods you eat and the vitamins you take with your health care professional. ?What side effects may I notice from receiving this medication? ?Side effects that you should report to your doctor or health care professional as soon as possible: ?allergic reactions like skin rash, itching or hives, swelling of the face, lips, or tongue ?breathing problems ?changes in vision ?chest pain ?confusion, trouble speaking or understanding ?feeling faint or lightheaded, falls ?high blood   pressure ?muscle aches or pains ?pain, swelling, warmth in the leg ?rapid weight gain ?severe headaches ?sudden numbness or weakness of the face, arm or leg ?trouble walking, dizziness, loss of balance or coordination ?seizures (convulsions) ?swelling of the ankles, feet, hands ?unusually weak or tired ?Side effects that usually do not require  medical attention (report to your doctor or health care professional if they continue or are bothersome): ?diarrhea ?fever, chills (flu-like symptoms) ?headaches ?nausea, vomiting ?redness, stinging, or swelling at site where injected ?This list may not describe all possible side effects. Call your doctor for medical advice about side effects. You may report side effects to FDA at 1-800-FDA-1088. ?Where should I keep my medication? ?Keep out of the reach of children. ?Store in a refrigerator between 2 and 8 degrees C (36 and 46 degrees F). Do not freeze. Do not shake. Throw away any unused portion if using a single-dose vial. Throw away any unused medicine after the expiration date. ?NOTE: This sheet is a summary. It may not cover all possible information. If you have questions about this medicine, talk to your doctor, pharmacist, or health care provider. ?? 2022 Elsevier/Gold Standard (2017-08-11 00:00:00) ? ?

## 2021-09-03 DIAGNOSIS — Z20828 Contact with and (suspected) exposure to other viral communicable diseases: Secondary | ICD-10-CM | POA: Diagnosis not present

## 2021-09-03 DIAGNOSIS — C674 Malignant neoplasm of posterior wall of bladder: Secondary | ICD-10-CM | POA: Diagnosis not present

## 2021-09-17 ENCOUNTER — Other Ambulatory Visit: Payer: Self-pay | Admitting: *Deleted

## 2021-09-17 DIAGNOSIS — D649 Anemia, unspecified: Secondary | ICD-10-CM

## 2021-09-17 DIAGNOSIS — D469 Myelodysplastic syndrome, unspecified: Secondary | ICD-10-CM

## 2021-09-19 ENCOUNTER — Inpatient Hospital Stay: Payer: Medicare Other | Attending: Hematology

## 2021-09-19 ENCOUNTER — Inpatient Hospital Stay: Payer: Medicare Other

## 2021-09-19 ENCOUNTER — Other Ambulatory Visit: Payer: Self-pay

## 2021-09-19 VITALS — BP 130/53 | HR 99 | Temp 98.5°F | Resp 16

## 2021-09-19 DIAGNOSIS — D46Z Other myelodysplastic syndromes: Secondary | ICD-10-CM | POA: Insufficient documentation

## 2021-09-19 DIAGNOSIS — D469 Myelodysplastic syndrome, unspecified: Secondary | ICD-10-CM

## 2021-09-19 DIAGNOSIS — D649 Anemia, unspecified: Secondary | ICD-10-CM

## 2021-09-19 LAB — CMP (CANCER CENTER ONLY)
ALT: 26 U/L (ref 0–44)
AST: 28 U/L (ref 15–41)
Albumin: 4.1 g/dL (ref 3.5–5.0)
Alkaline Phosphatase: 82 U/L (ref 38–126)
Anion gap: 5 (ref 5–15)
BUN: 25 mg/dL — ABNORMAL HIGH (ref 8–23)
CO2: 24 mmol/L (ref 22–32)
Calcium: 8.9 mg/dL (ref 8.9–10.3)
Chloride: 105 mmol/L (ref 98–111)
Creatinine: 1.28 mg/dL — ABNORMAL HIGH (ref 0.44–1.00)
GFR, Estimated: 40 mL/min — ABNORMAL LOW (ref >60)
Glucose, Bld: 103 mg/dL — ABNORMAL HIGH (ref 70–99)
Potassium: 4.2 mmol/L (ref 3.5–5.1)
Sodium: 134 mmol/L — ABNORMAL LOW (ref 135–145)
Total Bilirubin: 0.3 mg/dL (ref 0.3–1.2)
Total Protein: 6.9 g/dL (ref 6.5–8.1)

## 2021-09-19 LAB — CBC WITH DIFFERENTIAL (CANCER CENTER ONLY)
Abs Immature Granulocytes: 0.03 10*3/uL (ref 0.00–0.07)
Basophils Absolute: 0 10*3/uL (ref 0.0–0.1)
Basophils Relative: 0 %
Eosinophils Absolute: 0 10*3/uL (ref 0.0–0.5)
Eosinophils Relative: 0 %
HCT: 26.2 % — ABNORMAL LOW (ref 36.0–46.0)
Hemoglobin: 8.5 g/dL — ABNORMAL LOW (ref 12.0–15.0)
Immature Granulocytes: 1 %
Lymphocytes Relative: 32 %
Lymphs Abs: 1.1 10*3/uL (ref 0.7–4.0)
MCH: 33.3 pg (ref 26.0–34.0)
MCHC: 32.4 g/dL (ref 30.0–36.0)
MCV: 102.7 fL — ABNORMAL HIGH (ref 80.0–100.0)
Monocytes Absolute: 1.6 10*3/uL — ABNORMAL HIGH (ref 0.1–1.0)
Monocytes Relative: 46 %
Neutro Abs: 0.7 10*3/uL — ABNORMAL LOW (ref 1.7–7.7)
Neutrophils Relative %: 21 %
Platelet Count: 60 10*3/uL — ABNORMAL LOW (ref 150–400)
RBC: 2.55 MIL/uL — ABNORMAL LOW (ref 3.87–5.11)
RDW: 16.1 % — ABNORMAL HIGH (ref 11.5–15.5)
WBC Count: 3.5 10*3/uL — ABNORMAL LOW (ref 4.0–10.5)
nRBC: 0 % (ref 0.0–0.2)

## 2021-09-19 LAB — SAMPLE TO BLOOD BANK

## 2021-09-19 MED ORDER — DARBEPOETIN ALFA 150 MCG/0.3ML IJ SOSY
150.0000 ug | PREFILLED_SYRINGE | Freq: Once | INTRAMUSCULAR | Status: AC
  Administered 2021-09-19: 150 ug via SUBCUTANEOUS
  Filled 2021-09-19: qty 0.3

## 2021-09-21 DIAGNOSIS — R5383 Other fatigue: Secondary | ICD-10-CM | POA: Diagnosis not present

## 2021-09-21 DIAGNOSIS — J069 Acute upper respiratory infection, unspecified: Secondary | ICD-10-CM | POA: Diagnosis not present

## 2021-09-21 DIAGNOSIS — R059 Cough, unspecified: Secondary | ICD-10-CM | POA: Diagnosis not present

## 2021-09-21 DIAGNOSIS — R0981 Nasal congestion: Secondary | ICD-10-CM | POA: Diagnosis not present

## 2021-10-02 ENCOUNTER — Other Ambulatory Visit: Payer: Self-pay

## 2021-10-02 ENCOUNTER — Emergency Department (HOSPITAL_BASED_OUTPATIENT_CLINIC_OR_DEPARTMENT_OTHER)
Admission: EM | Admit: 2021-10-02 | Discharge: 2021-10-02 | Disposition: A | Discharge status: Skilled Nursing Facility | Payer: Medicare Other | Attending: Emergency Medicine | Admitting: Emergency Medicine

## 2021-10-02 ENCOUNTER — Emergency Department (HOSPITAL_BASED_OUTPATIENT_CLINIC_OR_DEPARTMENT_OTHER): Payer: Medicare Other

## 2021-10-02 ENCOUNTER — Encounter (HOSPITAL_BASED_OUTPATIENT_CLINIC_OR_DEPARTMENT_OTHER): Payer: Self-pay | Admitting: Urology

## 2021-10-02 DIAGNOSIS — E039 Hypothyroidism, unspecified: Secondary | ICD-10-CM | POA: Insufficient documentation

## 2021-10-02 DIAGNOSIS — R059 Cough, unspecified: Secondary | ICD-10-CM | POA: Diagnosis not present

## 2021-10-02 DIAGNOSIS — R300 Dysuria: Secondary | ICD-10-CM | POA: Insufficient documentation

## 2021-10-02 DIAGNOSIS — Z20822 Contact with and (suspected) exposure to covid-19: Secondary | ICD-10-CM | POA: Diagnosis not present

## 2021-10-02 DIAGNOSIS — R0602 Shortness of breath: Secondary | ICD-10-CM

## 2021-10-02 DIAGNOSIS — Z79899 Other long term (current) drug therapy: Secondary | ICD-10-CM | POA: Diagnosis not present

## 2021-10-02 HISTORY — DX: Myelodysplastic disease, not elsewhere classified: C94.6

## 2021-10-02 HISTORY — DX: Malignant (primary) neoplasm, unspecified: C80.1

## 2021-10-02 LAB — URINALYSIS, ROUTINE W REFLEX MICROSCOPIC
Bilirubin Urine: NEGATIVE
Glucose, UA: NEGATIVE mg/dL
Ketones, ur: NEGATIVE mg/dL
Leukocytes,Ua: NEGATIVE
Nitrite: NEGATIVE
Protein, ur: 30 mg/dL — AB
RBC / HPF: >50 RBC/hpf — ABNORMAL HIGH (ref 0–5)
Specific Gravity, Urine: 1.011 (ref 1.005–1.030)
pH: 6.5 (ref 5.0–8.0)

## 2021-10-02 LAB — BASIC METABOLIC PANEL
Anion gap: 11 (ref 5–15)
BUN: 22 mg/dL (ref 8–23)
CO2: 21 mmol/L — ABNORMAL LOW (ref 22–32)
Calcium: 9.3 mg/dL (ref 8.9–10.3)
Chloride: 104 mmol/L (ref 98–111)
Creatinine, Ser: 1.26 mg/dL — ABNORMAL HIGH (ref 0.44–1.00)
GFR, Estimated: 41 mL/min — ABNORMAL LOW (ref >60)
Glucose, Bld: 130 mg/dL — ABNORMAL HIGH (ref 70–99)
Potassium: 4.4 mmol/L (ref 3.5–5.1)
Sodium: 136 mmol/L (ref 135–145)

## 2021-10-02 LAB — CBC WITH DIFFERENTIAL/PLATELET
Abs Immature Granulocytes: 0.07 10*3/uL (ref 0.00–0.07)
Basophils Absolute: 0 10*3/uL (ref 0.0–0.1)
Basophils Relative: 0 %
Eosinophils Absolute: 0 10*3/uL (ref 0.0–0.5)
Eosinophils Relative: 0 %
HCT: 30.5 % — ABNORMAL LOW (ref 36.0–46.0)
Hemoglobin: 9.6 g/dL — ABNORMAL LOW (ref 12.0–15.0)
Immature Granulocytes: 3 %
Lymphocytes Relative: 40 %
Lymphs Abs: 1 10*3/uL (ref 0.7–4.0)
MCH: 32.9 pg (ref 26.0–34.0)
MCHC: 31.5 g/dL (ref 30.0–36.0)
MCV: 104.5 fL — ABNORMAL HIGH (ref 80.0–100.0)
Monocytes Absolute: 1 10*3/uL (ref 0.1–1.0)
Monocytes Relative: 38 %
Neutro Abs: 0.5 10*3/uL — ABNORMAL LOW (ref 1.7–7.7)
Neutrophils Relative %: 19 %
Platelets: 52 10*3/uL — ABNORMAL LOW (ref 150–400)
RBC: 2.92 MIL/uL — ABNORMAL LOW (ref 3.87–5.11)
RDW: 17.1 % — ABNORMAL HIGH (ref 11.5–15.5)
WBC: 2.6 10*3/uL — ABNORMAL LOW (ref 4.0–10.5)
nRBC: 0 % (ref 0.0–0.2)

## 2021-10-02 LAB — RESP PANEL BY RT-PCR (FLU A&B, COVID) ARPGX2
Influenza A by PCR: NEGATIVE
Influenza B by PCR: NEGATIVE
SARS Coronavirus 2 by RT PCR: NEGATIVE

## 2021-10-02 LAB — TROPONIN I (HIGH SENSITIVITY): Troponin I (High Sensitivity): 3 ng/L (ref <18)

## 2021-10-02 NOTE — ED Triage Notes (Signed)
Cough and congestions since august  Concern for UTI  Burning with urination, fatigue and shortness of breath over past week  Hx of myodysplastic disease   Respiratory at bedside

## 2021-10-02 NOTE — ED Provider Notes (Signed)
[facility] EMERGENCY DEPT Provider Note   CSN: [id number] Arrival date & time: 10/02/21  0347     History  Chief Complaint  Patient presents with   Shortness of Breath    Alison Bridges is a 88 y.o. female with hx of pancytopenia from MDS (Dr Irene Limbo oncologist), anemia, hypothyroidism, presenting to the ED with shortness of breath and dysuria.  The patient reports that she has chronic congestion and productive cough.  She feels she is more fatigued than normal the past week.  She also describes dysuria and says she has a history of recurring urine infections.  External records reviewed - last hgb 09/19/21 at 8.5  HPI     Home Medications Prior to Admission medications   Medication Sig Start Date End Date Taking? Authorizing Provider  Ascorbic Acid (VITAMIN C PO) Take 1 tablet by mouth daily.    [provider]  Azelastine HCl 137 MCG/SPRAY SOLN Place into both nostrils. 05/09/21   [provider]  b complex vitamins capsule Take 1 capsule by mouth daily.    [provider]  cholecalciferol (VITAMIN D) 1000 UNITS tablet Take 1,000 Units by mouth daily.    [provider]  levothyroxine (SYNTHROID, LEVOTHROID) 125 MCG tablet Take 125 mcg by mouth daily before breakfast.    [provider]  Multiple Vitamin (MULTIVITAMIN WITH MINERALS) TABS tablet Take 1 tablet by mouth daily.    [provider]  pantoprazole (PROTONIX) 40 MG tablet Take 40 mg by mouth as needed (acid reflux).    [provider]  polyethylene glycol (MIRALAX / GLYCOLAX) 17 g packet Take 17 g by mouth daily as needed for mild constipation. Patient not taking: Reported on 05/17/2020    [provider]  Sennosides (SENOKOT PO) Take 1 tablet by mouth at bedtime as needed. Patient not taking: Reported on 12/13/2020    [provider]  sertraline (ZOLOFT) 100 MG tablet Take 100 mg by mouth daily. 03/13/20   [provider]       Allergies    Patient has no known allergies.    Review of Systems   Review of Systems  Physical Exam Updated Vital Signs BP (!) 151/63    Pulse 75    Temp 98 F (36.7 C)    Resp (!) 23    Ht 5\' 8"  (1.727 m)    Wt 58.7 kg    SpO2 95%    BMI 19.68 kg/m  Physical Exam Constitutional:      General: She is not in acute distress. HENT:     Head: Normocephalic and atraumatic.  Eyes:     Conjunctiva/sclera: Conjunctivae normal.     Pupils: Pupils are equal, round, and reactive to light.  Cardiovascular:     Rate and Rhythm: Normal rate and regular rhythm.  Pulmonary:     Effort: Pulmonary effort is normal. No respiratory distress.  Abdominal:     General: There is no distension.     Tenderness: There is no abdominal tenderness.  Skin:    General: Skin is warm and dry.  Neurological:     General: No focal deficit present.     Mental Status: She is alert. Mental status is at baseline.  Psychiatric:        Mood and Affect: Mood normal.        Behavior: Behavior normal.    ED Results / Procedures / Treatments   Labs (all labs ordered are listed, but only abnormal  results are displayed) Labs Reviewed  URINALYSIS, ROUTINE W REFLEX MICROSCOPIC - Abnormal; Notable for the following components:      Result Value   Hgb urine dipstick LARGE (*)    Protein, ur 30 (*)    RBC / HPF >50 (*)    Bacteria, UA FEW (*)    All other components within normal limits  BASIC METABOLIC PANEL - Abnormal; Notable for the following components:   CO2 21 (*)    Glucose, Bld 130 (*)    Creatinine, Ser 1.26 (*)    GFR, Estimated 41 (*)    All other components within normal limits  CBC WITH DIFFERENTIAL/PLATELET - Abnormal; Notable for the following components:   WBC 2.6 (*)    RBC 2.92 (*)    Hemoglobin 9.6 (*)    HCT 30.5 (*)    MCV 104.5 (*)    RDW 17.1 (*)    Platelets 52 (*)    Neutro Abs 0.5 (*)    All other components within normal limits  RESP PANEL BY RT-PCR (FLU A&B, COVID) ARPGX2   TROPONIN I (HIGH SENSITIVITY)    EKG None  Radiology DG Chest Portable 1 View  Result Date: 10/02/2021 CLINICAL DATA:  Shortness of breath.  Cough and congestion. EXAM: PORTABLE CHEST 1 VIEW COMPARISON:  05/02/2021 FINDINGS: Heart size and mediastinal contours are unremarkable. Aortic atherosclerosis. No pleural effusion or edema. Lungs are hyperinflated with diffuse coarsened interstitial markings. No superimposed airspace consolidation. IMPRESSION: No acute cardiopulmonary abnormalities. Electronically Signed   By: Kerby Moors M.D.   On: 10/02/2021 12:05    Procedures Procedures    Medications Ordered in ED Medications - No data to display  ED Course/ Medical Decision Making/ A&P Clinical Course as of 10/02/21 1616  Tue Oct 02, 2021  1117 Hemoglobin(!): 9.6 [MT]  1117 Hemoglobin stable [MT]    Clinical Course User Index [MT] Wyvonnia Dusky, MD                           Medical Decision Making Amount and/or Complexity of Data Reviewed Labs: ordered. Decision-making details documented in ED Course. Radiology: ordered.   Patient is here with dysuria, cough and shortness of breath.  Differential diagnosis would include UTI versus viral syndrome versus acute anemia versus pneumonia versus other  I personally reviewed and interpreted patient's labs and imaging, notable for hgb at baseline, BMP and trop unremarkable  Patient's EKG per my interpretation shows normal sinus rhythm without acute ischemic findings.  I have a low clinical suspicion for ACS or pulmonary embolism at this time  I reviewed her external records including hematology outpatient office notes and recent hgb level  DG chest personally interpreted - no acute infiltrate or PTX  UA without sign of infection - +blood, patient reports she was evaluated for possible bladder malignancy by urologist, who opted for watchful waiting at this time.  Doubt kidney stone clinically.  Advised PCP  f/u          Final Clinical Impression(s) / ED Diagnoses Final diagnoses:  Shortness of breath    Rx / DC Orders ED Discharge Orders     None         Wyvonnia Dusky, MD 10/02/21 [phone]

## 2021-10-02 NOTE — ED Notes (Signed)
Pt verbalized understanding of discharge paperwork. [facility] called and sending driver to pick up pt. Pt waiting in room until ride gets here.

## 2021-10-08 DIAGNOSIS — Z20822 Contact with and (suspected) exposure to covid-19: Secondary | ICD-10-CM | POA: Diagnosis not present

## 2021-10-16 ENCOUNTER — Other Ambulatory Visit: Payer: Self-pay

## 2021-10-16 DIAGNOSIS — D469 Myelodysplastic syndrome, unspecified: Secondary | ICD-10-CM

## 2021-10-17 ENCOUNTER — Inpatient Hospital Stay: Payer: Medicare Other | Attending: Hematology

## 2021-10-17 ENCOUNTER — Other Ambulatory Visit: Payer: Self-pay

## 2021-10-17 ENCOUNTER — Inpatient Hospital Stay (HOSPITAL_BASED_OUTPATIENT_CLINIC_OR_DEPARTMENT_OTHER): Payer: Medicare Other | Admitting: Hematology

## 2021-10-17 ENCOUNTER — Telehealth: Payer: Self-pay | Admitting: *Deleted

## 2021-10-17 ENCOUNTER — Inpatient Hospital Stay: Payer: Medicare Other

## 2021-10-17 VITALS — BP 136/55 | HR 84 | Temp 97.3°F | Resp 18 | Wt 127.8 lb

## 2021-10-17 DIAGNOSIS — R162 Hepatomegaly with splenomegaly, not elsewhere classified: Secondary | ICD-10-CM | POA: Insufficient documentation

## 2021-10-17 DIAGNOSIS — Z800 Family history of malignant neoplasm of digestive organs: Secondary | ICD-10-CM | POA: Diagnosis not present

## 2021-10-17 DIAGNOSIS — D649 Anemia, unspecified: Secondary | ICD-10-CM | POA: Diagnosis not present

## 2021-10-17 DIAGNOSIS — D469 Myelodysplastic syndrome, unspecified: Secondary | ICD-10-CM | POA: Diagnosis not present

## 2021-10-17 DIAGNOSIS — D46Z Other myelodysplastic syndromes: Secondary | ICD-10-CM | POA: Insufficient documentation

## 2021-10-17 LAB — CBC WITH DIFFERENTIAL (CANCER CENTER ONLY)
Abs Immature Granulocytes: 0.03 10*3/uL (ref 0.00–0.07)
Basophils Absolute: 0 10*3/uL (ref 0.0–0.1)
Basophils Relative: 0 %
Eosinophils Absolute: 0 10*3/uL (ref 0.0–0.5)
Eosinophils Relative: 0 %
HCT: 29.8 % — ABNORMAL LOW (ref 36.0–46.0)
Hemoglobin: 9.3 g/dL — ABNORMAL LOW (ref 12.0–15.0)
Immature Granulocytes: 1 %
Lymphocytes Relative: 44 %
Lymphs Abs: 1.1 10*3/uL (ref 0.7–4.0)
MCH: 32.5 pg (ref 26.0–34.0)
MCHC: 31.2 g/dL (ref 30.0–36.0)
MCV: 104.2 fL — ABNORMAL HIGH (ref 80.0–100.0)
Monocytes Absolute: 0.9 10*3/uL (ref 0.1–1.0)
Monocytes Relative: 37 %
Neutro Abs: 0.4 10*3/uL — CL (ref 1.7–7.7)
Neutrophils Relative %: 18 %
Platelet Count: 88 10*3/uL — ABNORMAL LOW (ref 150–400)
RBC: 2.86 MIL/uL — ABNORMAL LOW (ref 3.87–5.11)
RDW: 15.6 % — ABNORMAL HIGH (ref 11.5–15.5)
WBC Count: 2.4 10*3/uL — ABNORMAL LOW (ref 4.0–10.5)
nRBC: 0 % (ref 0.0–0.2)

## 2021-10-17 LAB — CMP (CANCER CENTER ONLY)
ALT: 16 U/L (ref 0–44)
AST: 18 U/L (ref 15–41)
Albumin: 4.2 g/dL (ref 3.5–5.0)
Alkaline Phosphatase: 86 U/L (ref 38–126)
Anion gap: 5 (ref 5–15)
BUN: 25 mg/dL — ABNORMAL HIGH (ref 8–23)
CO2: 27 mmol/L (ref 22–32)
Calcium: 9.3 mg/dL (ref 8.9–10.3)
Chloride: 105 mmol/L (ref 98–111)
Creatinine: 1.32 mg/dL — ABNORMAL HIGH (ref 0.44–1.00)
GFR, Estimated: 38 mL/min — ABNORMAL LOW (ref >60)
Glucose, Bld: 82 mg/dL (ref 70–99)
Potassium: 4.2 mmol/L (ref 3.5–5.1)
Sodium: 137 mmol/L (ref 135–145)
Total Bilirubin: 0.4 mg/dL (ref 0.3–1.2)
Total Protein: 7.5 g/dL (ref 6.5–8.1)

## 2021-10-17 LAB — SAMPLE TO BLOOD BANK

## 2021-10-17 MED ORDER — DARBEPOETIN ALFA 150 MCG/0.3ML IJ SOSY
150.0000 ug | PREFILLED_SYRINGE | Freq: Once | INTRAMUSCULAR | Status: AC
  Administered 2021-10-17: 150 ug via SUBCUTANEOUS
  Filled 2021-10-17: qty 0.3

## 2021-10-17 NOTE — Progress Notes (Signed)
? ? ?HEMATOLOGY/ONCOLOGY CLINIC NOTE ? ?Date of Service: .10/17/2021 ? ? ?Patient Care Team: ?Burnard Bunting, MD as PCP - General (Internal Medicine) ?[name], MD as Consulting Physician (Urology) ? ?REFERRING PHYSICIAN: Burnard Bunting, MD ? ?CHIEF COMPLAINTS/PURPOSE OF CONSULTATION:  ?Follow-up for continued management of pancytopenia from MDS ? ?HISTORY OF PRESENTING ILLNESS: Please see previous notes for details on initial presentation ? ?INTERVAL HISTORY:  ? ?Alison Bridges is a wonderful 88 y.o. 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 ?She notes some persistent chronic fatigue.  No lightheadedness or dizziness.  No issues with tolerating her ESA's. ?She notes that she has been evaluated by urology Dr. Lovena Neighbours and was thought to have possible bladder cancer and is scheduled for cystoscopy in the next month or so. ?She notes no obvious hematuria or abdominal pain. ?No issues with abnormal bleeding. ?No acute shortness of breath or chest pain. ?Labs done today were reviewed with the patient ? ?MEDICAL HISTORY:  ?Past Medical History:  ?Diagnosis Date  ? Cancer [facility])   ? Depression   ? Hypothyroidism   ? Myelodysplastic disease ([place])   ? Osteoporosis   ? ?SURGICAL HISTORY: ?Past Surgical History:  ?Procedure Laterality Date  ? ABDOMINAL HYSTERECTOMY    ? APPENDECTOMY  1998  ? CHOLECYSTECTOMY  1998  ? ?SOCIAL HISTORY: ?Social History  ? ?Socioeconomic History  ? Marital status: Widowed  ?  Spouse name: Not on file  ? Number of children: Not on file  ? Years of education: Not on file  ? Highest education level: Not on file  ?Occupational History  ? Occupation: Oncologist  ?  Comment: Retired  ?Tobacco Use  ? Smoking status: Never  ? Smokeless tobacco: Never  ?Substance and Sexual Activity  ? Alcohol use: No  ? Drug use: No  ? Sexual activity: Not on file  ?Other Topics Concern   ? Not on file  ?Social History Narrative  ? She is tried to stay active --  2 days a week she does senior aerobics.  She also enjoys going for walks, but no standard routine.  ?   ? She has 3 children and 2 grandchildren.  ? ?Social Determinants of Health  ? ?Financial Resource Strain: Not on file  ?Food Insecurity: Not on file  ?Transportation Needs: Not on file  ?Physical Activity: Not on file  ?Stress: Not on file  ?Social Connections: Not on file  ?Intimate Partner Violence: Not on file  ? ? ? ?FAMILY HISTORY: ?Family History  ?Problem Relation Age of Onset  ? Heart attack Mother 62  ? Pancreatic cancer Father   ? Parkinson's disease Sister   ? Diabetes Brother   ?     Multiple complications  ? Heart disease Brother   ?     Not sure what happened  ? Dementia Brother   ? ? ? ?ALLERGIES:   ?has No Known Allergies. ? ? ?MEDICATIONS:  ?Current Outpatient Medications  ?Medication Sig Dispense Refill  ? Ascorbic Acid (VITAMIN C PO) Take 1 tablet by mouth daily.    ? Azelastine HCl 137 MCG/SPRAY SOLN Place into both nostrils.    ? b complex vitamins capsule Take 1 capsule by mouth daily.    ? cholecalciferol (VITAMIN D) 1000 UNITS tablet Take 1,000 Units by mouth daily.    ? levothyroxine (SYNTHROID, LEVOTHROID) 125 MCG tablet Take 125 mcg by mouth daily  before breakfast.    ? Multiple Vitamin (MULTIVITAMIN WITH MINERALS) TABS tablet Take 1 tablet by mouth daily.    ? pantoprazole (PROTONIX) 40 MG tablet Take 40 mg by mouth as needed (acid reflux).    ? polyethylene glycol (MIRALAX / GLYCOLAX) 17 g packet Take 17 g by mouth daily as needed for mild constipation. (Patient not taking: Reported on 05/17/2020)    ? Sennosides (SENOKOT PO) Take 1 tablet by mouth at bedtime as needed. (Patient not taking: Reported on 12/13/2020)    ? sertraline (ZOLOFT) 100 MG tablet Take 100 mg by mouth daily.    ? ?No current facility-administered medications for this visit.  ? ? ? ?REVIEW OF SYSTEMS:   ?.10 Point review of Systems was  done is negative except as noted above. ? ? ? ?PHYSICAL EXAMINATION: ?ECOG PERFORMANCE STATUS: 2 - Symptomatic, <50% confined to bed ? ?Vitals:  ? 10/17/21 1025  ?BP: (!) 136/55  ?Pulse: 84  ?Resp: 18  ?Temp: (!) 97.3 ?F (36.3 ?C)  ?SpO2: 97%  ? ?Filed Weights  ? 10/17/21 1025  ?Weight: 127 lb 12.8 oz (58 kg)  ? ?Body mass index is 19.43 kg/m?. ?.  NAD ?GENERAL:alert, in no acute distress and comfortable ?SKIN: no acute rashes, no significant lesions ?EYES: conjunctiva are pink and non-injected, sclera anicteric ?OROPHARYNX: MMM, no exudates, no oropharyngeal erythema or ulceration ?NECK: supple, no JVD ?LYMPH:  no palpable lymphadenopathy in the cervical, axillary or inguinal regions ?LUNGS: clear to auscultation b/l with normal respiratory effort ?HEART: regular rate & rhythm ?ABDOMEN:  normoactive bowel sounds , non tender, not distended. ?Extremity: no pedal edema ?PSYCH: alert & oriented x 3 with fluent speech ?NEURO: no focal motor/sensory deficits ? ?LABORATORY DATA:  ?I have reviewed the data as listed ? ?CBC Latest Ref Rng & Units 10/17/2021 10/02/2021 09/19/2021  ?WBC 4.0 - 10.5 K/uL 2.4(L) 2.6(L) 3.5(L)  ?Hemoglobin 12.0 - 15.0 g/dL 9.3(L) 9.6(L) 8.5(L)  ?Hematocrit 36.0 - 46.0 % 29.8(L) 30.5(L) 26.2(L)  ?Platelets 150 - 400 K/uL 88(L) 52(L) 60(L)  ?[place] 700 ? ?CMP Latest Ref Rng & Units 10/17/2021 10/02/2021 09/19/2021  ?Glucose 70 - 99 mg/dL 82 130(H) 103(H)  ?BUN 8 - 23 mg/dL 25(H) 22 25(H)  ?Creatinine 0.44 - 1.00 mg/dL 1.32(H) 1.26(H) 1.28(H)  ?Sodium 135 - 145 mmol/L 137 136 134(L)  ?Potassium 3.5 - 5.1 mmol/L 4.2 4.4 4.2  ?Chloride 98 - 111 mmol/L 105 104 105  ?CO2 22 - 32 mmol/L 27 21(L) 24  ?Calcium 8.9 - 10.3 mg/dL 9.3 9.3 8.9  ?Total Protein 6.5 - 8.1 g/dL 7.5 - 6.9  ?Total Bilirubin 0.3 - 1.2 mg/dL 0.4 - 0.3  ?Alkaline Phos 38 - 126 U/L 86 - 82  ?AST 15 - 41 U/L 18 - 28  ?ALT 0 - 44 U/L 16 - 26  ? ?. ?Lab Results  ?Component Value Date  ? IRON 90 08/16/2020  ? TIBC 211 (L) 08/16/2020  ? IRONPCTSAT 43  08/16/2020  ? ?(Iron and TIBC) ? ?Lab Results  ?Component Value Date  ? FERRITIN 1,482 (H) 08/16/2020  ? ? ? ?01/10/2020 Cytogenetics [phone]): ? ? ? ?01/10/2020 FISH Panel ([id number]): ? ? ?01/10/2020 BM Bx Report [id number]):  ? ? ?12/16/2019 JAK2, MPL, CALR Panel Report:  ? ? ?RADIOGRAPHIC STUDIES: ?I have personally reviewed the radiological images as listed and agreed with the findings in the report. ?DG Chest Portable 1 View ? ?Result Date: 10/02/2021 ?CLINICAL DATA:  Shortness of breath.  Cough and congestion. EXAM:  PORTABLE CHEST 1 VIEW COMPARISON:  05/02/2021 FINDINGS: Heart size and mediastinal contours are unremarkable. Aortic atherosclerosis. No pleural effusion or edema. Lungs are hyperinflated with diffuse coarsened interstitial markings. No superimposed airspace consolidation. IMPRESSION: No acute cardiopulmonary abnormalities. Electronically Signed   By: Kerby Moors M.D.   On: 10/02/2021 12:05   ? ? ?ASSESSMENT & PLAN:  ? ?ELKE HOLTRY is a 88 y.o. female with: ?  ?Pancytopenia - likely from MDS +/- MPN ?-01/10/2020 BM Bx Surgical Pathology Report ([id number]) revealed "BONE MARROW, ASPIRATE, CLOT, CORE: -Hypercellular marrow with myeloid hyperplasia and megakaryocytic atypia  PERIPHERAL BLOOD: -  Pancytopenia". ?-12/16/19 JAK2 (including V617 and Exon 12), MPL, and CALR-Next Generation Sequencing shows "Tier II: Variants of Potential Clinical Significance - SRSF2 p.Pro95His" ? ?2.  Hepatosplenomegaly likely from her MDS/MPN noted on CT abdomen pelvis January 2022 ? ?PLAN: ?-Labs done today were reviewed with the patient in detail ?CBC shows hemoglobin of 9.3 WBC count of 2.4k with platelets of 88k ? ? ?-Level is stable at 9.3.  No indication for PRBC transfusion at this time. ? ?-We shall continue her same dose of Aranesp at 150 mg every 4 weeks. dose for goal Hgb ? 10. ?-Platelets at 88k no issues with bleeding. ?-Avoid NSAIDs or other medications that can worsen  thrombocytopenia. ?-Continue daily vitamin B complex ?-Stable chronic leukopenia with neutropenia ANC 400.  No issues with infections.  No role for Granix except if the patient develops significant infection or needs unavo

## 2021-10-17 NOTE — Telephone Encounter (Signed)
CRITICAL VALUE STICKER ? ?CRITICAL VALUE: Absolute neutrophils 0.4 ? ?RECEIVER (on-site recipient of call):Sandi K, RN ? ?DATE & TIME NOTIFIED: 10/17/21; 7981 ? ?MESSENGER (representative from lab):Heather ? ?MD NOTIFIED: Dr. Irene Limbo ? ?TIME OF NOTIFICATION:1015 ? ?RESPONSE: Information acknowledged  ? ?

## 2021-10-23 ENCOUNTER — Encounter: Payer: Self-pay | Admitting: Hematology

## 2021-11-05 DIAGNOSIS — Z20822 Contact with and (suspected) exposure to covid-19: Secondary | ICD-10-CM | POA: Diagnosis not present

## 2021-11-19 ENCOUNTER — Other Ambulatory Visit: Payer: Self-pay

## 2021-11-19 DIAGNOSIS — D469 Myelodysplastic syndrome, unspecified: Secondary | ICD-10-CM

## 2021-11-21 ENCOUNTER — Inpatient Hospital Stay: Payer: Medicare Other | Attending: Hematology

## 2021-11-21 ENCOUNTER — Inpatient Hospital Stay: Payer: Medicare Other

## 2021-11-21 ENCOUNTER — Other Ambulatory Visit: Payer: Self-pay

## 2021-11-21 VITALS — BP 118/52 | HR 73 | Temp 98.2°F | Resp 16

## 2021-11-21 DIAGNOSIS — D46Z Other myelodysplastic syndromes: Secondary | ICD-10-CM | POA: Diagnosis not present

## 2021-11-21 DIAGNOSIS — D469 Myelodysplastic syndrome, unspecified: Secondary | ICD-10-CM

## 2021-11-21 DIAGNOSIS — D649 Anemia, unspecified: Secondary | ICD-10-CM

## 2021-11-21 LAB — SAMPLE TO BLOOD BANK

## 2021-11-21 LAB — CBC WITH DIFFERENTIAL (CANCER CENTER ONLY)
Abs Immature Granulocytes: 0.03 10*3/uL (ref 0.00–0.07)
Basophils Absolute: 0 10*3/uL (ref 0.0–0.1)
Basophils Relative: 0 %
Eosinophils Absolute: 0 10*3/uL (ref 0.0–0.5)
Eosinophils Relative: 0 %
HCT: 26.9 % — ABNORMAL LOW (ref 36.0–46.0)
Hemoglobin: 8.7 g/dL — ABNORMAL LOW (ref 12.0–15.0)
Immature Granulocytes: 1 %
Lymphocytes Relative: 39 %
Lymphs Abs: 1 10*3/uL (ref 0.7–4.0)
MCH: 33.2 pg (ref 26.0–34.0)
MCHC: 32.3 g/dL (ref 30.0–36.0)
MCV: 102.7 fL — ABNORMAL HIGH (ref 80.0–100.0)
Monocytes Absolute: 1.1 10*3/uL — ABNORMAL HIGH (ref 0.1–1.0)
Monocytes Relative: 42 %
Neutro Abs: 0.5 10*3/uL — ABNORMAL LOW (ref 1.7–7.7)
Neutrophils Relative %: 18 %
Platelet Count: 65 10*3/uL — ABNORMAL LOW (ref 150–400)
RBC: 2.62 MIL/uL — ABNORMAL LOW (ref 3.87–5.11)
RDW: 15.8 % — ABNORMAL HIGH (ref 11.5–15.5)
WBC Count: 2.6 10*3/uL — ABNORMAL LOW (ref 4.0–10.5)
nRBC: 0 % (ref 0.0–0.2)

## 2021-11-21 LAB — CMP (CANCER CENTER ONLY)
ALT: 12 U/L (ref 0–44)
AST: 13 U/L — ABNORMAL LOW (ref 15–41)
Albumin: 3.9 g/dL (ref 3.5–5.0)
Alkaline Phosphatase: 88 U/L (ref 38–126)
Anion gap: 5 (ref 5–15)
BUN: 30 mg/dL — ABNORMAL HIGH (ref 8–23)
CO2: 27 mmol/L (ref 22–32)
Calcium: 9.1 mg/dL (ref 8.9–10.3)
Chloride: 104 mmol/L (ref 98–111)
Creatinine: 1.42 mg/dL — ABNORMAL HIGH (ref 0.44–1.00)
GFR, Estimated: 35 mL/min — ABNORMAL LOW (ref >60)
Glucose, Bld: 101 mg/dL — ABNORMAL HIGH (ref 70–99)
Potassium: 4.1 mmol/L (ref 3.5–5.1)
Sodium: 136 mmol/L (ref 135–145)
Total Bilirubin: 0.3 mg/dL (ref 0.3–1.2)
Total Protein: 6.9 g/dL (ref 6.5–8.1)

## 2021-11-21 MED ORDER — DARBEPOETIN ALFA 150 MCG/0.3ML IJ SOSY
150.0000 ug | PREFILLED_SYRINGE | Freq: Once | INTRAMUSCULAR | Status: AC
  Administered 2021-11-21: 150 ug via SUBCUTANEOUS
  Filled 2021-11-21: qty 0.3

## 2021-11-21 NOTE — Patient Instructions (Signed)
Darbepoetin Alfa injection ?What is this medication? ?DARBEPOETIN ALFA (dar be POE e tin  AL fa) helps your body make more red blood cells. It is used to treat anemia caused by chronic kidney failure and chemotherapy. ?This medicine may be used for other purposes; ask your health care provider or pharmacist if you have questions. ?COMMON BRAND NAME(S): Aranesp ?What should I tell my care team before I take this medication? ?They need to know if you have any of these conditions: ?blood clotting disorders or history of blood clots ?cancer patient not on chemotherapy ?cystic fibrosis ?heart disease, such as angina, heart failure, or a history of a heart attack ?hemoglobin level of 12 g/dL or greater ?high blood pressure ?low levels of folate, iron, or vitamin B12 ?seizures ?an unusual or allergic reaction to darbepoetin, erythropoietin, albumin, hamster proteins, latex, other medicines, foods, dyes, or preservatives ?pregnant or trying to get pregnant ?breast-feeding ?How should I use this medication? ?This medicine is for injection into a vein or under the skin. It is usually given by a health care professional in a hospital or clinic setting. ?If you get this medicine at home, you will be taught how to prepare and give this medicine. Use exactly as directed. Take your medicine at regular intervals. Do not take your medicine more often than directed. ?It is important that you put your used needles and syringes in a special sharps container. Do not put them in a trash can. If you do not have a sharps container, call your pharmacist or healthcare provider to get one. ?A special MedGuide will be given to you by the pharmacist with each prescription and refill. Be sure to read this information carefully each time. ?Talk to your pediatrician regarding the use of this medicine in children. While this medicine may be used in children as young as 1 month of age for selected conditions, precautions do apply. ?Overdosage: If  you think you have taken too much of this medicine contact a poison control center or emergency room at once. ?NOTE: This medicine is only for you. Do not share this medicine with others. ?What if I miss a dose? ?If you miss a dose, take it as soon as you can. If it is almost time for your next dose, take only that dose. Do not take double or extra doses. ?What may interact with this medication? ?Do not take this medicine with any of the following medications: ?epoetin alfa ?This list may not describe all possible interactions. Give your health care provider a list of all the medicines, herbs, non-prescription drugs, or dietary supplements you use. Also tell them if you smoke, drink alcohol, or use illegal drugs. Some items may interact with your medicine. ?What should I watch for while using this medication? ?Your condition will be monitored carefully while you are receiving this medicine. ?You may need blood work done while you are taking this medicine. ?This medicine may cause a decrease in vitamin B6. You should make sure that you get enough vitamin B6 while you are taking this medicine. Discuss the foods you eat and the vitamins you take with your health care professional. ?What side effects may I notice from receiving this medication? ?Side effects that you should report to your doctor or health care professional as soon as possible: ?allergic reactions like skin rash, itching or hives, swelling of the face, lips, or tongue ?breathing problems ?changes in vision ?chest pain ?confusion, trouble speaking or understanding ?feeling faint or lightheaded, falls ?high blood   pressure ?muscle aches or pains ?pain, swelling, warmth in the leg ?rapid weight gain ?severe headaches ?sudden numbness or weakness of the face, arm or leg ?trouble walking, dizziness, loss of balance or coordination ?seizures (convulsions) ?swelling of the ankles, feet, hands ?unusually weak or tired ?Side effects that usually do not require  medical attention (report to your doctor or health care professional if they continue or are bothersome): ?diarrhea ?fever, chills (flu-like symptoms) ?headaches ?nausea, vomiting ?redness, stinging, or swelling at site where injected ?This list may not describe all possible side effects. Call your doctor for medical advice about side effects. You may report side effects to FDA at 1-800-FDA-1088. ?Where should I keep my medication? ?Keep out of the reach of children and pets. ?Store in a refrigerator. Do not freeze. Do not shake. Throw away any unused portion if using a single-dose vial. Multi-dose vials can be kept in the refrigerator for up to 21 days after the initial dose. Throw away unused medicine. ?To get rid of medications that are no longer needed or have expired: ?Take the medication to a medication take-back program. Check with your pharmacy or law enforcement to find a location. ?If you cannot return the medication, ask your pharmacist or care team how to get rid of the medication safely. ?NOTE: This sheet is a summary. It may not cover all possible information. If you have questions about this medicine, talk to your doctor, pharmacist, or health care provider. ?? 2023 [employer]/Gold Standard (2021-07-23 00:00:00) ? ?

## 2021-11-28 DIAGNOSIS — Z20822 Contact with and (suspected) exposure to covid-19: Secondary | ICD-10-CM | POA: Diagnosis not present

## 2021-11-30 DIAGNOSIS — C674 Malignant neoplasm of posterior wall of bladder: Secondary | ICD-10-CM | POA: Diagnosis not present

## 2021-12-18 ENCOUNTER — Other Ambulatory Visit: Payer: Self-pay | Admitting: *Deleted

## 2021-12-18 DIAGNOSIS — D469 Myelodysplastic syndrome, unspecified: Secondary | ICD-10-CM

## 2021-12-19 ENCOUNTER — Other Ambulatory Visit: Payer: Self-pay

## 2021-12-19 ENCOUNTER — Inpatient Hospital Stay: Payer: Medicare Other | Attending: Hematology

## 2021-12-19 ENCOUNTER — Inpatient Hospital Stay: Payer: Medicare Other

## 2021-12-19 VITALS — BP 126/53 | HR 76 | Temp 98.2°F | Resp 18

## 2021-12-19 DIAGNOSIS — D469 Myelodysplastic syndrome, unspecified: Secondary | ICD-10-CM

## 2021-12-19 DIAGNOSIS — D46Z Other myelodysplastic syndromes: Secondary | ICD-10-CM | POA: Diagnosis not present

## 2021-12-19 DIAGNOSIS — D649 Anemia, unspecified: Secondary | ICD-10-CM

## 2021-12-19 LAB — CMP (CANCER CENTER ONLY)
ALT: 14 U/L (ref 0–44)
AST: 16 U/L (ref 15–41)
Albumin: 4.1 g/dL (ref 3.5–5.0)
Alkaline Phosphatase: 82 U/L (ref 38–126)
Anion gap: 6 (ref 5–15)
BUN: 26 mg/dL — ABNORMAL HIGH (ref 8–23)
CO2: 24 mmol/L (ref 22–32)
Calcium: 8.8 mg/dL — ABNORMAL LOW (ref 8.9–10.3)
Chloride: 106 mmol/L (ref 98–111)
Creatinine: 1.32 mg/dL — ABNORMAL HIGH (ref 0.44–1.00)
GFR, Estimated: 38 mL/min — ABNORMAL LOW (ref >60)
Glucose, Bld: 108 mg/dL — ABNORMAL HIGH (ref 70–99)
Potassium: 4.2 mmol/L (ref 3.5–5.1)
Sodium: 136 mmol/L (ref 135–145)
Total Bilirubin: 0.4 mg/dL (ref 0.3–1.2)
Total Protein: 6.9 g/dL (ref 6.5–8.1)

## 2021-12-19 LAB — CBC WITH DIFFERENTIAL (CANCER CENTER ONLY)
Abs Immature Granulocytes: 0.05 10*3/uL (ref 0.00–0.07)
Basophils Absolute: 0 10*3/uL (ref 0.0–0.1)
Basophils Relative: 0 %
Eosinophils Absolute: 0 10*3/uL (ref 0.0–0.5)
Eosinophils Relative: 0 %
HCT: 28 % — ABNORMAL LOW (ref 36.0–46.0)
Hemoglobin: 9 g/dL — ABNORMAL LOW (ref 12.0–15.0)
Immature Granulocytes: 2 %
Lymphocytes Relative: 38 %
Lymphs Abs: 1.1 10*3/uL (ref 0.7–4.0)
MCH: 32.8 pg (ref 26.0–34.0)
MCHC: 32.1 g/dL (ref 30.0–36.0)
MCV: 102.2 fL — ABNORMAL HIGH (ref 80.0–100.0)
Monocytes Absolute: 1.1 10*3/uL — ABNORMAL HIGH (ref 0.1–1.0)
Monocytes Relative: 39 %
Neutro Abs: 0.6 10*3/uL — ABNORMAL LOW (ref 1.7–7.7)
Neutrophils Relative %: 21 %
Platelet Count: 64 10*3/uL — ABNORMAL LOW (ref 150–400)
RBC: 2.74 MIL/uL — ABNORMAL LOW (ref 3.87–5.11)
RDW: 16.5 % — ABNORMAL HIGH (ref 11.5–15.5)
WBC Count: 2.9 10*3/uL — ABNORMAL LOW (ref 4.0–10.5)
nRBC: 0 % (ref 0.0–0.2)

## 2021-12-19 LAB — SAMPLE TO BLOOD BANK

## 2021-12-19 MED ORDER — DARBEPOETIN ALFA 150 MCG/0.3ML IJ SOSY
150.0000 ug | PREFILLED_SYRINGE | Freq: Once | INTRAMUSCULAR | Status: AC
  Administered 2021-12-19: 150 ug via SUBCUTANEOUS
  Filled 2021-12-19: qty 0.3

## 2021-12-24 DIAGNOSIS — N390 Urinary tract infection, site not specified: Secondary | ICD-10-CM | POA: Diagnosis not present

## 2022-01-13 ENCOUNTER — Observation Stay (HOSPITAL_COMMUNITY)
Admission: EM | Admit: 2022-01-13 | Discharge: 2022-01-14 | Disposition: A | Discharge status: Home / Self Care | Payer: Medicare Other | Attending: Internal Medicine | Admitting: Internal Medicine

## 2022-01-13 ENCOUNTER — Encounter (HOSPITAL_COMMUNITY): Payer: Self-pay

## 2022-01-13 ENCOUNTER — Other Ambulatory Visit: Payer: Self-pay

## 2022-01-13 ENCOUNTER — Emergency Department (HOSPITAL_COMMUNITY): Payer: Medicare Other

## 2022-01-13 DIAGNOSIS — K219 Gastro-esophageal reflux disease without esophagitis: Secondary | ICD-10-CM | POA: Insufficient documentation

## 2022-01-13 DIAGNOSIS — R7989 Other specified abnormal findings of blood chemistry: Secondary | ICD-10-CM | POA: Diagnosis not present

## 2022-01-13 DIAGNOSIS — N1832 Chronic kidney disease, stage 3b: Secondary | ICD-10-CM | POA: Insufficient documentation

## 2022-01-13 DIAGNOSIS — D638 Anemia in other chronic diseases classified elsewhere: Secondary | ICD-10-CM | POA: Insufficient documentation

## 2022-01-13 DIAGNOSIS — D649 Anemia, unspecified: Secondary | ICD-10-CM | POA: Diagnosis not present

## 2022-01-13 DIAGNOSIS — N281 Cyst of kidney, acquired: Secondary | ICD-10-CM | POA: Diagnosis not present

## 2022-01-13 DIAGNOSIS — D469 Myelodysplastic syndrome, unspecified: Secondary | ICD-10-CM | POA: Diagnosis not present

## 2022-01-13 DIAGNOSIS — N183 Chronic kidney disease, stage 3 unspecified: Secondary | ICD-10-CM | POA: Diagnosis present

## 2022-01-13 DIAGNOSIS — E039 Hypothyroidism, unspecified: Secondary | ICD-10-CM | POA: Diagnosis not present

## 2022-01-13 DIAGNOSIS — K5792 Diverticulitis of intestine, part unspecified, without perforation or abscess without bleeding: Secondary | ICD-10-CM | POA: Diagnosis not present

## 2022-01-13 DIAGNOSIS — Z66 Do not resuscitate: Secondary | ICD-10-CM | POA: Diagnosis not present

## 2022-01-13 DIAGNOSIS — R079 Chest pain, unspecified: Secondary | ICD-10-CM | POA: Diagnosis not present

## 2022-01-13 DIAGNOSIS — Z8616 Personal history of COVID-19: Secondary | ICD-10-CM | POA: Diagnosis not present

## 2022-01-13 DIAGNOSIS — R9431 Abnormal electrocardiogram [ECG] [EKG]: Secondary | ICD-10-CM | POA: Diagnosis not present

## 2022-01-13 DIAGNOSIS — Z8551 Personal history of malignant neoplasm of bladder: Secondary | ICD-10-CM | POA: Insufficient documentation

## 2022-01-13 DIAGNOSIS — R162 Hepatomegaly with splenomegaly, not elsewhere classified: Secondary | ICD-10-CM | POA: Diagnosis not present

## 2022-01-13 DIAGNOSIS — R2681 Unsteadiness on feet: Secondary | ICD-10-CM | POA: Insufficient documentation

## 2022-01-13 DIAGNOSIS — D61818 Other pancytopenia: Secondary | ICD-10-CM | POA: Diagnosis not present

## 2022-01-13 DIAGNOSIS — K5730 Diverticulosis of large intestine without perforation or abscess without bleeding: Secondary | ICD-10-CM | POA: Diagnosis not present

## 2022-01-13 DIAGNOSIS — R053 Chronic cough: Secondary | ICD-10-CM | POA: Insufficient documentation

## 2022-01-13 DIAGNOSIS — R2689 Other abnormalities of gait and mobility: Secondary | ICD-10-CM | POA: Insufficient documentation

## 2022-01-13 DIAGNOSIS — R109 Unspecified abdominal pain: Secondary | ICD-10-CM | POA: Diagnosis not present

## 2022-01-13 DIAGNOSIS — K5732 Diverticulitis of large intestine without perforation or abscess without bleeding: Secondary | ICD-10-CM | POA: Diagnosis not present

## 2022-01-13 DIAGNOSIS — M810 Age-related osteoporosis without current pathological fracture: Secondary | ICD-10-CM | POA: Diagnosis not present

## 2022-01-13 DIAGNOSIS — R059 Cough, unspecified: Secondary | ICD-10-CM | POA: Diagnosis not present

## 2022-01-13 DIAGNOSIS — K5900 Constipation, unspecified: Secondary | ICD-10-CM | POA: Insufficient documentation

## 2022-01-13 DIAGNOSIS — D696 Thrombocytopenia, unspecified: Secondary | ICD-10-CM | POA: Diagnosis not present

## 2022-01-13 DIAGNOSIS — R509 Fever, unspecified: Secondary | ICD-10-CM | POA: Diagnosis not present

## 2022-01-13 DIAGNOSIS — R300 Dysuria: Secondary | ICD-10-CM | POA: Diagnosis present

## 2022-01-13 DIAGNOSIS — R140 Abdominal distension (gaseous): Secondary | ICD-10-CM | POA: Insufficient documentation

## 2022-01-13 DIAGNOSIS — D539 Nutritional anemia, unspecified: Secondary | ICD-10-CM | POA: Diagnosis not present

## 2022-01-13 DIAGNOSIS — R69 Illness, unspecified: Secondary | ICD-10-CM

## 2022-01-13 DIAGNOSIS — Z8249 Family history of ischemic heart disease and other diseases of the circulatory system: Secondary | ICD-10-CM | POA: Diagnosis not present

## 2022-01-13 DIAGNOSIS — N200 Calculus of kidney: Secondary | ICD-10-CM | POA: Diagnosis not present

## 2022-01-13 DIAGNOSIS — C946 Myelodysplastic disease, not classified: Secondary | ICD-10-CM | POA: Insufficient documentation

## 2022-01-13 DIAGNOSIS — Z79899 Other long term (current) drug therapy: Secondary | ICD-10-CM | POA: Insufficient documentation

## 2022-01-13 DIAGNOSIS — Z79890 Hormone replacement therapy (postmenopausal): Secondary | ICD-10-CM | POA: Diagnosis not present

## 2022-01-13 LAB — CBC WITH DIFFERENTIAL/PLATELET
Abs Immature Granulocytes: 0.03 10*3/uL (ref 0.00–0.07)
Basophils Absolute: 0 10*3/uL (ref 0.0–0.1)
Basophils Relative: 0 %
Eosinophils Absolute: 0 10*3/uL (ref 0.0–0.5)
Eosinophils Relative: 0 %
HCT: 27.6 % — ABNORMAL LOW (ref 36.0–46.0)
Hemoglobin: 8.7 g/dL — ABNORMAL LOW (ref 12.0–15.0)
Immature Granulocytes: 1 %
Lymphocytes Relative: 18 %
Lymphs Abs: 0.8 10*3/uL (ref 0.7–4.0)
MCH: 33.1 pg (ref 26.0–34.0)
MCHC: 31.5 g/dL (ref 30.0–36.0)
MCV: 104.9 fL — ABNORMAL HIGH (ref 80.0–100.0)
Monocytes Absolute: 1.7 10*3/uL — ABNORMAL HIGH (ref 0.1–1.0)
Monocytes Relative: 39 %
Neutro Abs: 1.8 10*3/uL (ref 1.7–7.7)
Neutrophils Relative %: 42 %
Platelets: 61 10*3/uL — ABNORMAL LOW (ref 150–400)
RBC: 2.63 MIL/uL — ABNORMAL LOW (ref 3.87–5.11)
RDW: 16.7 % — ABNORMAL HIGH (ref 11.5–15.5)
WBC: 4.3 10*3/uL (ref 4.0–10.5)
nRBC: 0 % (ref 0.0–0.2)

## 2022-01-13 LAB — RETICULOCYTES
Immature Retic Fract: 12.1 % (ref 2.3–15.9)
RBC.: 2.67 MIL/uL — ABNORMAL LOW (ref 3.87–5.11)
Retic Count, Absolute: 57.9 10*3/uL (ref 19.0–186.0)
Retic Ct Pct: 2.2 % (ref 0.4–3.1)

## 2022-01-13 LAB — COMPREHENSIVE METABOLIC PANEL
ALT: 18 U/L (ref 0–44)
AST: 18 U/L (ref 15–41)
Albumin: 3.8 g/dL (ref 3.5–5.0)
Alkaline Phosphatase: 80 U/L (ref 38–126)
Anion gap: 9 (ref 5–15)
BUN: 28 mg/dL — ABNORMAL HIGH (ref 8–23)
CO2: 20 mmol/L — ABNORMAL LOW (ref 22–32)
Calcium: 8.8 mg/dL — ABNORMAL LOW (ref 8.9–10.3)
Chloride: 105 mmol/L (ref 98–111)
Creatinine, Ser: 1.31 mg/dL — ABNORMAL HIGH (ref 0.44–1.00)
GFR, Estimated: 39 mL/min — ABNORMAL LOW (ref >60)
Glucose, Bld: 101 mg/dL — ABNORMAL HIGH (ref 70–99)
Potassium: 4.6 mmol/L (ref 3.5–5.1)
Sodium: 134 mmol/L — ABNORMAL LOW (ref 135–145)
Total Bilirubin: 0.8 mg/dL (ref 0.3–1.2)
Total Protein: 7 g/dL (ref 6.5–8.1)

## 2022-01-13 LAB — URINALYSIS, ROUTINE W REFLEX MICROSCOPIC
Bacteria, UA: NONE SEEN
Bilirubin Urine: NEGATIVE
Glucose, UA: NEGATIVE mg/dL
Ketones, ur: NEGATIVE mg/dL
Leukocytes,Ua: NEGATIVE
Nitrite: NEGATIVE
Protein, ur: NEGATIVE mg/dL
Specific Gravity, Urine: 1.008 (ref 1.005–1.030)
pH: 6 (ref 5.0–8.0)

## 2022-01-13 LAB — MAGNESIUM: Magnesium: 2 mg/dL (ref 1.7–2.4)

## 2022-01-13 LAB — LIPASE, BLOOD: Lipase: 40 U/L (ref 11–51)

## 2022-01-13 LAB — LACTIC ACID, PLASMA
Lactic Acid, Venous: 0.5 mmol/L (ref 0.5–1.9)
Lactic Acid, Venous: 1.9 mmol/L (ref 0.5–1.9)

## 2022-01-13 LAB — PROTIME-INR
INR: 1.2 (ref 0.8–1.2)
Prothrombin Time: 15.5 seconds — ABNORMAL HIGH (ref 11.4–15.2)

## 2022-01-13 LAB — CK: Total CK: 30 U/L — ABNORMAL LOW (ref 38–234)

## 2022-01-13 LAB — PHOSPHORUS: Phosphorus: 4.1 mg/dL (ref 2.5–4.6)

## 2022-01-13 MED ORDER — MELATONIN 3 MG PO TABS
6.0000 mg | ORAL_TABLET | Freq: Once | ORAL | Status: AC
  Administered 2022-01-13: 6 mg via ORAL
  Filled 2022-01-13: qty 2

## 2022-01-13 MED ORDER — METRONIDAZOLE 500 MG/100ML IV SOLN
500.0000 mg | Freq: Two times a day (BID) | INTRAVENOUS | Status: DC
  Administered 2022-01-13: 500 mg via INTRAVENOUS
  Filled 2022-01-13: qty 100

## 2022-01-13 MED ORDER — POLYETHYLENE GLYCOL 3350 17 G PO PACK
17.0000 g | PACK | Freq: Every day | ORAL | Status: DC | PRN
  Administered 2022-01-13: 17 g via ORAL
  Filled 2022-01-13: qty 1

## 2022-01-13 MED ORDER — ACETAMINOPHEN 325 MG PO TABS: 650.0000 mg | ORAL_TABLET | Freq: Four times a day (QID) | ORAL | Status: DC | PRN

## 2022-01-13 MED ORDER — SODIUM CHLORIDE (PF) 0.9 % IJ SOLN
INTRAMUSCULAR | Status: AC
  Filled 2022-01-13: qty 50

## 2022-01-13 MED ORDER — SODIUM CHLORIDE 0.9 % IV SOLN: 2.0000 g | INTRAVENOUS | Status: DC

## 2022-01-13 MED ORDER — SODIUM CHLORIDE 0.9 % IV SOLN: INTRAVENOUS | Status: AC

## 2022-01-13 MED ORDER — PANTOPRAZOLE SODIUM 40 MG PO TBEC: 40.0000 mg | DELAYED_RELEASE_TABLET | Freq: Every day | ORAL | Status: DC | PRN

## 2022-01-13 MED ORDER — IOHEXOL 300 MG/ML  SOLN
80.0000 mL | Freq: Once | INTRAMUSCULAR | Status: AC | PRN
  Administered 2022-01-13: 80 mL via INTRAVENOUS

## 2022-01-13 MED ORDER — METRONIDAZOLE 500 MG/100ML IV SOLN
500.0000 mg | Freq: Two times a day (BID) | INTRAVENOUS | Status: DC
Start: 2022-01-14 — End: 2022-01-14
  Administered 2022-01-14: 500 mg via INTRAVENOUS
  Filled 2022-01-13: qty 100

## 2022-01-13 MED ORDER — ACETAMINOPHEN 650 MG RE SUPP: 650.0000 mg | Freq: Four times a day (QID) | RECTAL | Status: DC | PRN

## 2022-01-13 MED ORDER — HYDROCODONE-ACETAMINOPHEN 5-325 MG PO TABS
1.0000 | ORAL_TABLET | ORAL | Status: DC | PRN
  Administered 2022-01-13 – 2022-01-14 (×2): 1 via ORAL
  Filled 2022-01-13 (×2): qty 1

## 2022-01-13 MED ORDER — LEVOTHYROXINE SODIUM 25 MCG PO TABS
125.0000 ug | ORAL_TABLET | Freq: Every day | ORAL | Status: DC
  Administered 2022-01-14: 125 ug via ORAL
  Filled 2022-01-13: qty 1

## 2022-01-13 MED ORDER — LACTATED RINGERS IV SOLN: INTRAVENOUS | Status: DC

## 2022-01-13 MED ORDER — SODIUM CHLORIDE 0.9 % IV SOLN
1.0000 g | Freq: Once | INTRAVENOUS | Status: AC
  Administered 2022-01-13: 1 g via INTRAVENOUS
  Filled 2022-01-13: qty 10

## 2022-01-13 MED ORDER — LACTATED RINGERS IV BOLUS
1000.0000 mL | Freq: Once | INTRAVENOUS | Status: AC
  Administered 2022-01-13: 1000 mL via INTRAVENOUS

## 2022-01-13 MED ORDER — SERTRALINE HCL 100 MG PO TABS
100.0000 mg | ORAL_TABLET | Freq: Every day | ORAL | Status: DC
  Administered 2022-01-14: 100 mg via ORAL
  Filled 2022-01-13: qty 1

## 2022-01-13 NOTE — H&P (Signed)
Alison Bridges LFY:[id number] DOB: [birth year] DOA: 01/13/2022     PCP: Burnard Bunting, MD   Outpatient Specialists:     Oncology  Dr.Kale   Urology Dr.  Gilford Rile  Patient arrived to ER on 01/13/22 at 1520 Referred by Attending [name], MD   Patient coming from:     From facility  [place] [facility]  Chief Complaint:   Chief Complaint  Patient presents with   Dysuria    HPI: Alison Bridges is a 88 y.o. female with medical history significant of MDS, hypothyroidism, CKD, chronic pancytopenia, needing blood transfusions, constipation h\x of bladder cancer  Presented with   dysuria abdominal pain Patient presents with dysuria urinary frequency, abdominal pain for the past 3 days Constipation has not had a bowel movement for the past few days very poor appetite EMS noted that her abdomen was distended she had a low-grade fever up to 99.8-100. Patient reports she has chronic cough which is mild since having COVID last summer.  No increased shortness of breath no chest pain.  She is still making urine.  No blood in stool  No falls No tobacco no EtoH   Regarding pertinent Chronic problems:         Hypothyroidism: on synthroid        CKD stage IIIb - baseline Cr 1.3 CrCl cannot be calculated (Unknown ideal weight.).  Lab Results  Component Value Date   CREATININE 1.31 (H) 01/13/2022   CREATININE 1.32 (H) 12/19/2021   CREATININE 1.42 (H) 11/21/2021   Chronic anemia - baseline hg Hemoglobin & Hematocrit  Recent Labs    11/21/21 0926 12/19/21 0925 01/13/22 1601  HGB 8.7* 9.0* 8.7*     While in ER:   CT abdomen showed evidence of diverticulitis started on Rocephin and Flagyl   CXR -  NON acute  CTabd/pelvis - Diverticulosis of colon. There is wall thickening along with pericolic stranding in the sigmoid colon in the left side of pelvis consistent with acute diverticulitis. There is no loculated pericolic abscess.    Following Medications were  ordered in ER: Medications  lactated ringers infusion ( Intravenous New Bag/Given 01/13/22 1647)  cefTRIAXone (ROCEPHIN) 1 g in sodium chloride 0.9 % 100 mL IVPB (has no administration in time range)  metroNIDAZOLE (FLAGYL) IVPB 500 mg (has no administration in time range)  iohexol (OMNIPAQUE) 300 MG/ML solution 80 mL (80 mLs Intravenous Contrast Given 01/13/22 1753)  sodium chloride (PF) 0.9 % injection (  Given by Other 01/13/22 1826)  lactated ringers bolus 1,000 mL (1,000 mLs Intravenous New Bag/Given 01/13/22 1822)       ED Triage Vitals [01/13/22 1547]  Enc Vitals Group     BP (!) 137/57     Pulse Rate 82     Resp 18     Temp 97.9 F (36.6 C)     Temp Source Oral     SpO2 96 %     Weight      Height      Head Circumference      Peak Flow      Pain Score      Pain Loc      Pain Edu?      Excl. in [place]?   ENID(78)@     _________________________________________ Significant initial  Findings: Abnormal Labs Reviewed  COMPREHENSIVE METABOLIC PANEL - Abnormal; Notable for the following components:      Result Value   Sodium 134 (*)  CO2 20 (*)    Glucose, Bld 101 (*)    BUN 28 (*)    Creatinine, Ser 1.31 (*)    Calcium 8.8 (*)    GFR, Estimated 39 (*)    All other components within normal limits  CBC WITH DIFFERENTIAL/PLATELET - Abnormal; Notable for the following components:   RBC 2.63 (*)    Hemoglobin 8.7 (*)    HCT 27.6 (*)    MCV 104.9 (*)    RDW 16.7 (*)    Platelets 61 (*)    Monocytes Absolute 1.7 (*)    All other components within normal limits  PROTIME-INR - Abnormal; Notable for the following components:   Prothrombin Time 15.5 (*)    All other components within normal limits     ECG: Ordered Personally reviewed by me showing: HR : 71 Rhythm: Borderline left axis deviation Borderline low voltage, extremity leads [id number]    The recent clinical data is shown below. Vitals:   01/13/22 1547 01/13/22 1630 01/13/22 1730 01/13/22 1815  BP: (!) 137/57  138/61 135/68 134/69  Pulse: 82 72 80 80  Resp: 18 17 (!) 22 18  Temp: 97.9 F (36.6 C)     TempSrc: Oral     SpO2: 96% 97% 96% 97%    WBC     Component Value Date/Time   WBC 4.3 01/13/2022 1601   LYMPHSABS 0.8 01/13/2022 1601   MONOABS 1.7 (H) 01/13/2022 1601   EOSABS 0.0 01/13/2022 1601   BASOSABS 0.0 01/13/2022 1601    Lactic Acid, Venous    Component Value Date/Time   LATICACIDVEN 0.5 01/13/2022 1640       UA   no evidence of UTI      Urine analysis:    Component Value Date/Time   COLORURINE YELLOW 01/13/2022 1828   APPEARANCEUR HAZY (A) 01/13/2022 1828   LABSPEC 1.008 01/13/2022 1828   PHURINE 6.0 01/13/2022 1828   GLUCOSEU NEGATIVE 01/13/2022 1828   HGBUR MODERATE (A) 01/13/2022 1828   BILIRUBINUR NEGATIVE 01/13/2022 1828   KETONESUR NEGATIVE 01/13/2022 1828   PROTEINUR NEGATIVE 01/13/2022 1828   NITRITE NEGATIVE 01/13/2022 1828   LEUKOCYTESUR NEGATIVE 01/13/2022 1828     _______________________________________________ Hospitalist was called for admission for   Diverticulitis    The following Work up has been ordered so far:  Orders Placed This Encounter  Procedures   DG Chest Port 1 View   CT Abdomen Pelvis W Contrast   Comprehensive metabolic panel   Brain natriuretic peptide   Lactic acid, plasma   Lipase, blood   CBC with Differential   Protime-INR   Urinalysis, Routine w reflex microscopic   CK   Magnesium   Phosphorus   Prealbumin   Urinalysis, Complete w Microscopic   Bladder scan   Cardiac monitoring   Consult to hospitalist   [place] lock IV   Admit to Inpatient (patient's expected length of stay will be greater than 2 midnights or inpatient only procedure)     OTHER Significant initial  Findings:  labs showing:    Recent Labs  Lab 01/13/22 1601  NA 134*  K 4.6  CO2 20*  GLUCOSE 101*  BUN 28*  CREATININE 1.31*  CALCIUM 8.8*    Cr    stable,  Lab Results  Component Value Date   CREATININE 1.31 (H) 01/13/2022    CREATININE 1.32 (H) 12/19/2021   CREATININE 1.42 (H) 11/21/2021    Recent Labs  Lab 01/13/22 1601  AST 18  ALT  18  ALKPHOS 80  BILITOT 0.8  PROT 7.0  ALBUMIN 3.8   Lab Results  Component Value Date   CALCIUM 8.8 (L) 01/13/2022          Plt: Lab Results  Component Value Date   PLT 61 (L) 01/13/2022     Recent Labs  Lab 01/13/22 1601  WBC 4.3  NEUTROABS 1.8  HGB 8.7*  HCT 27.6*  MCV 104.9*  PLT 61*    HG/HCT   stable,      Component Value Date/Time   HGB 8.7 (L) 01/13/2022 1601   HGB 9.0 (L) 12/19/2021 0925   HCT 27.6 (L) 01/13/2022 1601   MCV 104.9 (H) 01/13/2022 1601     Recent Labs  Lab 01/13/22 1601  LIPASE 40      Cardiac Panel (last 3 results) Recent Labs    01/13/22 1901  CKTOTAL 30*        Cultures:    Component Value Date/Time   SDES  04/12/2020 1118    URINE, CLEAN CATCH Performed at [facility], [place] [address], [place], [place] [address]    SPECREQUEST  04/12/2020 1118    NONE Performed at [facility], [place] [address], [place], [place] [address]    CULT MULTIPLE SPECIES PRESENT, SUGGEST RECOLLECTION (A) 04/12/2020 1118   REPTSTATUS 04/13/2020 FINAL 04/12/2020 1118     Radiological Exams on Admission: CT Abdomen Pelvis W Contrast  Result Date: 01/13/2022 CLINICAL DATA:  Abdominal pain EXAM: CT ABDOMEN AND PELVIS WITH CONTRAST TECHNIQUE: Multidetector CT imaging of the abdomen and pelvis was performed using the standard protocol following bolus administration of intravenous contrast. RADIATION DOSE REDUCTION: This exam was performed according to the departmental dose-optimization program which includes automated exposure control, adjustment of the mA and/or kV according to patient size and/or use of iterative reconstruction technique. CONTRAST:  53m OMNIPAQUE IOHEXOL 300 MG/ML  SOLN COMPARISON:  08/14/2020 FINDINGS: Lower chest: Small linear densities in the lower lung fields may  suggest scarring or subsegmental atelectasis. Hepatobiliary: Liver measures 24.8 cm in length. No focal abnormality is seen. There is no dilation of bile ducts. Surgical clips are seen in gallbladder fossa. Pancreas: No focal abnormality is seen. Spleen: Spleen is enlarged measuring 15.5 cm in maximum diameter. Adrenals/Urinary Tract: Adrenals are unremarkable. There is no hydronephrosis. There are few small left renal stones largest measuring 5 mm. There are few bilateral renal cysts largest measuring 6.9 cm in the anterior midportion of left kidney. Urinary bladder is unremarkable. Stomach/Bowel: There is fluid in the lumen of lower thoracic esophagus suggesting gastroesophageal reflux. Stomach is not distended. Small bowel loops are not dilated. Cecum is lying to the left of midline. Appendix is not seen. Scattered diverticula are seen in the colon. There is wall thickening and pericolic stranding in and around sigmoid colon in the left side of pelvis. There is no loculated pericolic abscess. Vascular/Lymphatic: There are scattered arterial calcifications. Reproductive: Uterus is not seen. Other: There is no ascites or pneumoperitoneum. Musculoskeletal: There is grade 1 anterolisthesis at L5-S1 level. Degenerative changes are noted with disc space narrowing, bony spurs and facet hypertrophy at multiple levels. There is spinal stenosis and encroachment of neural foramina at multiple levels. IMPRESSION: Diverticulosis of colon. There is wall thickening along with pericolic stranding in the sigmoid colon in the left side of pelvis consistent with acute diverticulitis. There is no loculated pericolic abscess. There is no evidence of intestinal obstruction. There is no hydronephrosis. Marked  gastroesophageal reflux. There are few nonobstructing left renal stones. There are few bilateral renal cysts. Enlarged liver and spleen. Other findings as described in the body of the report. Electronically Signed   By: Elmer Picker M.D.   On: 01/13/2022 18:16   DG Chest Port 1 View  Result Date: 01/13/2022 CLINICAL DATA:  Cough, abdominal pain EXAM: PORTABLE CHEST 1 VIEW COMPARISON:  None Available. FINDINGS: Normal mediastinum and cardiac silhouette. Normal pulmonary vasculature. No evidence of effusion, infiltrate, or pneumothorax. No acute bony abnormality. IMPRESSION: No acute cardiopulmonary process. Electronically Signed   By: Suzy Bouchard M.D.   On: 01/13/2022 16:24   _______________________________________________________________________________________________________ Latest  Blood pressure 134/69, pulse 80, temperature 97.9 F (36.6 C), temperature source Oral, resp. rate 18, SpO2 97 %.   Vitals  labs and radiology finding personally reviewed  Review of Systems:    Pertinent positives include:   chills, fatigue,  abdominal pain, nausea,  dysuria, c Constitutional:  No weight loss, night sweats, Fevers,weight loss  HEENT:  No headaches, Difficulty swallowing,Tooth/dental problems,Sore throat,  No sneezing, itching, ear ache, nasal congestion, post nasal drip,  Cardio-vascular:  No chest pain, Orthopnea, PND, anasarca, dizziness, palpitations.no Bilateral lower extremity swelling  GI:  No heartburn, indigestion, vomiting, diarrhea, change in bowel habits, loss of appetite, melena, blood in stool, hematemesis Resp:  no shortness of breath at rest. No dyspnea on exertion, No excess mucus, no productive cough, No non-productive cough, No coughing up of blood.No change in color of mucus.No wheezing. Skin:  no rash or lesions. No jaundice GU:  nohange in color of urine, no urgency or frequency. No straining to urinate.  No flank pain.  Musculoskeletal:  No joint pain or no joint swelling. No decreased range of motion. No back pain.  Psych:  No change in mood or affect. No depression or anxiety. No memory loss.  Neuro: no localizing neurological complaints, no tingling, no weakness, no double  vision, no gait abnormality, no slurred speech, no confusion  All systems reviewed and apart from [place] all are negative _______________________________________________________________________________________________ Past Medical History:   Past Medical History:  Diagnosis Date   Cancer ([place])    Depression    Hypothyroidism    Myelodysplastic disease ([place])    Osteoporosis     Past Surgical History:  Procedure Laterality Date   [place]    Social History:  Ambulatory   independently      reports that she has never smoked. She has never used smokeless tobacco. She reports that she does not drink alcohol and does not use drugs.   Family History:   Family History  Problem Relation Age of Onset   Heart attack Mother 36   Pancreatic cancer Father    Parkinson's disease Sister    Diabetes Brother        Multiple complications   Heart disease Brother        Not sure what happened   Dementia Brother    ______________________________________________________________________________________________ Allergies: No Known Allergies   Prior to Admission medications   Medication Sig Start Date End Date Taking? Authorizing Provider  Ascorbic Acid (VITAMIN C PO) Take 1 tablet by mouth daily.    [provider]  Azelastine HCl 137 MCG/SPRAY SOLN Place into both nostrils. 05/09/21   [provider]  b complex vitamins capsule Take 1 capsule by mouth daily.    [provider]  cholecalciferol (VITAMIN D)  1000 UNITS tablet Take 1,000 Units by mouth daily.    [provider]  levothyroxine (SYNTHROID, LEVOTHROID) 125 MCG tablet Take 125 mcg by mouth daily before breakfast.    [provider]  Multiple Vitamin (MULTIVITAMIN WITH MINERALS) TABS tablet Take 1 tablet by mouth daily.    [provider]  pantoprazole (PROTONIX) 40 MG tablet Take 40 mg by mouth as needed (acid  reflux).    [provider]  polyethylene glycol (MIRALAX / GLYCOLAX) 17 g packet Take 17 g by mouth daily as needed for mild constipation. Patient not taking: Reported on 05/17/2020    [provider]  Sennosides (SENOKOT PO) Take 1 tablet by mouth at bedtime as needed. Patient not taking: Reported on 12/13/2020    [provider]  sertraline (ZOLOFT) 100 MG tablet Take 100 mg by mouth daily. 03/13/20   [provider]    ___________________________________________________________________________________________________ Physical Exam:    01/13/2022    6:15 PM 01/13/2022    5:30 PM 01/13/2022    4:30 PM  Vitals with BMI  Systolic [phone]  Diastolic 69 68 61  Pulse 80 80 72     1. General:  in No  Acute distress    Chronically ill  -appearing 2. Psychological: Alert and   Oriented 3. Head/ENT:   Dry Mucous Membranes                          Head Non traumatic, neck supple                             Poor Dentition 4. SKIN:  decreased Skin turgor,  Skin clean Dry and intact no rash 5. Heart: Regular rate and rhythm no  Murmur, no Rub or gallop 6. Lungs: no wheezes or crackles   7. Abdomen: Soft,  suprapubic tenderness  Non distended bowel sounds present 8. Lower extremities: no clubbing, cyanosis, no  edema 9. Neurologically Grossly intact, moving all 4 extremities equally   10. MSK: Normal range of motion    Chart has been reviewed  ______________________________________________________________________________________________  Assessment/Plan  88 y.o. female with medical history significant of MDS, hypothyroidism, CKD, chronic pancytopenia, needing blood transfusions, constipation    Admitted for   Diverticulitis     Present on Admission:  Diverticulitis  MDS (myelodysplastic syndrome) ([place])  Anemia  CKD (chronic kidney disease), stage III ([place])     MDS (myelodysplastic syndrome) (H[facility]) Chronic stable continue to monitor CBC  well but cell count Hemoglobin at baseline transfuse as needed for hemoglobin approaching 7 Platelets at baseline patient is thrombocytopenic.  Avoid anticoagulation. Fall precautions Continue to follow outpatient with oncology hematology  Diverticulitis Continue Rocephin and Flagyl  Bowel rest Rehydrate  Anemia Chronic stable in the setting of mild dysplastic syndrome.  Continue to follow CBC transfuse as needed for hemoglobin below 7  CKD (chronic kidney disease), stage III (H[facility])  -chronic avoid nephrotoxic medications such as NSAIDs, Vanco Zosyn combo,  avoid hypotension, continue to follow renal function    Other plan as per orders.  DVT prophylaxis:  SCD     Code Status:  DNR/DNI  e as per patient  I had personally discussed CODE STATUS with patient     Family Communication:   Family not at  Bedside    Disposition Plan:  Back to current facility when stable                               Following barriers for discharge:                                                         Anemia stable                                                           Afebrile, white count improving able to transition to PO antibiotics                         Transition of care consulted                 Consults called: none  Admission status:  ED Disposition     ED Disposition  Admit   Condition  --   [place]: [place] [[id number]]  Level of Care: Telemetry [5]  Admit to tele based on following criteria: Other see comments  Comments: anemia  May admit patient to [facility] or [facility] if equivalent level of care is available:: No  Covid Evaluation: Asymptomatic - no recent exposure (last 10 days) testing not required  Diagnosis: Diverticulitis [[id number]]  Admitting Physician: [name] [[id number]]  Attending Physician: [name] [[id number]]  Estimated length of stay: past midnight tomorrow   Certification:: I certify this patient will need inpatient services for at least 2 midnights            inpatient     I Expect 2 midnight stay secondary to severity of patient's current illness need for inpatient interventions justified by the following:    Severe lab/radiological/exam abnormalities including:   Diverticulitis and extensive comorbidities including:   CKD    malignancy,    That are currently affecting medical management.   I expect  patient to be hospitalized for 2 midnights requiring inpatient medical care.  Patient is at high risk for adverse outcome (such as loss of life or disability) if not treated.  Indication for inpatient stay as follows:    severe pain requiring acute inpatient management,  inability to maintain oral hydration      Need for IV antibiotics, IV fluids,      Level of care     tele  For 12H     Tehilla Coffel 01/13/2022, 8:32 PM    [facility]     after 2 AM please page floor coverage PA If 7AM-7PM, please contact the day team taking care of the patient using [email or web address]   Patient was evaluated in the context of the global COVID-19 pandemic, which necessitated consideration that the patient might be at risk for infection with the SARS-CoV-2 virus that causes COVID-19. Institutional protocols and algorithms that pertain to the evaluation of patients at risk for COVID-19 are in a state of rapid change based on information released by regulatory bodies including the CDC and federal and state organizations. These policies and algorithms were followed  during the patient's care.

## 2022-01-13 NOTE — Assessment & Plan Note (Signed)
Chronic stable in the setting of mild dysplastic syndrome.  Continue to follow CBC transfuse as needed for hemoglobin below 7

## 2022-01-13 NOTE — ED Provider Notes (Signed)
[facility] DEPT Provider Note   CSN: [id number] Arrival date & time: 01/13/22  1520     History  Chief Complaint  Patient presents with   Dysuria    ANSELMA HERBEL is a 88 y.o. female.  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  She has been doing well.  She reports about 3 days ago she started getting abdominal pain.  It was gradual in onset.  Lower abdomen has become more tender and bloated.  Patient reports that she is making urine.  No significant burning with urination.  She reports she has had decreased bowel movement recently.  No vomiting, no fever.  Reports she had some chronic low-grade cough since having COVID last summer.  She has not noted any increased shortness of breath or chest pain.    Home Medications Prior to Admission medications   Medication Sig Start Date End Date Taking? Authorizing Provider  Ascorbic Acid (VITAMIN C PO) Take 1 tablet by mouth daily.    [provider]  Azelastine HCl 137 MCG/SPRAY SOLN Place into both nostrils. 05/09/21   [provider]  b complex vitamins capsule Take 1 capsule by mouth daily.    [provider]  cholecalciferol (VITAMIN D) 1000 UNITS tablet Take 1,000 Units by mouth daily.    [provider]  levothyroxine (SYNTHROID, LEVOTHROID) 125 MCG tablet Take 125 mcg by mouth daily before breakfast.    [provider]  Multiple Vitamin (MULTIVITAMIN WITH MINERALS) TABS tablet Take 1 tablet by mouth daily.    [provider]  pantoprazole (PROTONIX) 40 MG tablet Take 40 mg by mouth as needed (acid reflux).    [provider]  polyethylene glycol (MIRALAX / GLYCOLAX) 17 g packet Take 17 g by mouth daily as needed for mild constipation. Patient not taking: Reported on 05/17/2020    [provider]  Sennosides (SENOKOT PO) Take 1 tablet by mouth at bedtime as needed. Patient  not taking: Reported on 12/13/2020    [provider]  sertraline (ZOLOFT) 100 MG tablet Take 100 mg by mouth daily. 03/13/20   [provider]      Allergies    Patient has no known allergies.    Review of Systems   Review of Systems 10 systems reviewed negative except as per HPI Physical Exam Updated Vital Signs BP 134/69   Pulse 80   Temp 97.9 F (36.6 C) (Oral)   Resp 18   SpO2 97%  Physical Exam Constitutional:      Appearance: Normal appearance.  HENT:     Head: Normocephalic and atraumatic.     Mouth/Throat:     Pharynx: Oropharynx is clear.  Eyes:     Extraocular Movements: Extraocular movements intact.  Cardiovascular:     Rate and Rhythm: Normal rate and regular rhythm.  Pulmonary:     Effort: Pulmonary effort is normal.     Breath sounds: Normal breath sounds.  Abdominal:     Comments: Abdomen soft.  Mild to moderate lower abdominal distention.  Lower abdomen is tender to palpation without guarding.  Femoral pulses 2+ and symmetric.  Musculoskeletal:        General: No swelling or tenderness. Normal range of motion.     Right lower leg: No edema.     Left lower leg: No edema.  Skin:    General: Skin is warm and dry.  Neurological:  General: No focal deficit present.     Mental Status: She is alert and oriented to person, place, and time.     Motor: No weakness.     Coordination: Coordination normal.  Psychiatric:        Mood and Affect: Mood normal.     ED Results / Procedures / Treatments   Labs (all labs ordered are listed, but only abnormal results are displayed) Labs Reviewed  COMPREHENSIVE METABOLIC PANEL - Abnormal; Notable for the following components:      Result Value   Sodium 134 (*)    CO2 20 (*)    Glucose, Bld 101 (*)    BUN 28 (*)    Creatinine, Ser 1.31 (*)    Calcium 8.8 (*)    GFR, Estimated 39 (*)    All other components within normal limits  CBC WITH DIFFERENTIAL/PLATELET - Abnormal; Notable for the  following components:   RBC 2.63 (*)    Hemoglobin 8.7 (*)    HCT 27.6 (*)    MCV 104.9 (*)    RDW 16.7 (*)    Platelets 61 (*)    Monocytes Absolute 1.7 (*)    All other components within normal limits  PROTIME-INR - Abnormal; Notable for the following components:   Prothrombin Time 15.5 (*)    All other components within normal limits  LACTIC ACID, PLASMA  LIPASE, BLOOD  BRAIN NATRIURETIC PEPTIDE  LACTIC ACID, PLASMA  URINALYSIS, ROUTINE W REFLEX MICROSCOPIC    EKG None  Radiology CT Abdomen Pelvis W Contrast  Result Date: 01/13/2022 CLINICAL DATA:  Abdominal pain EXAM: CT ABDOMEN AND PELVIS WITH CONTRAST TECHNIQUE: Multidetector CT imaging of the abdomen and pelvis was performed using the standard protocol following bolus administration of intravenous contrast. RADIATION DOSE REDUCTION: This exam was performed according to the departmental dose-optimization program which includes automated exposure control, adjustment of the mA and/or kV according to patient size and/or use of iterative reconstruction technique. CONTRAST:  21m OMNIPAQUE IOHEXOL 300 MG/ML  SOLN COMPARISON:  08/14/2020 FINDINGS: Lower chest: Small linear densities in the lower lung fields may suggest scarring or subsegmental atelectasis. Hepatobiliary: Liver measures 24.8 cm in length. No focal abnormality is seen. There is no dilation of bile ducts. Surgical clips are seen in gallbladder fossa. Pancreas: No focal abnormality is seen. Spleen: Spleen is enlarged measuring 15.5 cm in maximum diameter. Adrenals/Urinary Tract: Adrenals are unremarkable. There is no hydronephrosis. There are few small left renal stones largest measuring 5 mm. There are few bilateral renal cysts largest measuring 6.9 cm in the anterior midportion of left kidney. Urinary bladder is unremarkable. Stomach/Bowel: There is fluid in the lumen of lower thoracic esophagus suggesting gastroesophageal reflux. Stomach is not distended. Small bowel loops  are not dilated. Cecum is lying to the left of midline. Appendix is not seen. Scattered diverticula are seen in the colon. There is wall thickening and pericolic stranding in and around sigmoid colon in the left side of pelvis. There is no loculated pericolic abscess. Vascular/Lymphatic: There are scattered arterial calcifications. Reproductive: Uterus is not seen. Other: There is no ascites or pneumoperitoneum. Musculoskeletal: There is grade 1 anterolisthesis at L5-S1 level. Degenerative changes are noted with disc space narrowing, bony spurs and facet hypertrophy at multiple levels. There is spinal stenosis and encroachment of neural foramina at multiple levels. IMPRESSION: Diverticulosis of colon. There is wall thickening along with pericolic stranding in the sigmoid colon in the left side of pelvis consistent with acute diverticulitis. There is  no loculated pericolic abscess. There is no evidence of intestinal obstruction. There is no hydronephrosis. Marked gastroesophageal reflux. There are few nonobstructing left renal stones. There are few bilateral renal cysts. Enlarged liver and spleen. Other findings as described in the body of the report. Electronically Signed   By: Elmer Picker M.D.   On: 01/13/2022 18:16   DG Chest Port 1 View  Result Date: 01/13/2022 CLINICAL DATA:  Cough, abdominal pain EXAM: PORTABLE CHEST 1 VIEW COMPARISON:  None Available. FINDINGS: Normal mediastinum and cardiac silhouette. Normal pulmonary vasculature. No evidence of effusion, infiltrate, or pneumothorax. No acute bony abnormality. IMPRESSION: No acute cardiopulmonary process. Electronically Signed   By: Suzy Bouchard M.D.   On: 01/13/2022 16:24    Procedures Procedures    Medications Ordered in ED Medications  lactated ringers infusion ( Intravenous New Bag/Given 01/13/22 1647)  cefTRIAXone (ROCEPHIN) 1 g in sodium chloride 0.9 % 100 mL IVPB (has no administration in time range)  metroNIDAZOLE (FLAGYL)  IVPB 500 mg (has no administration in time range)  iohexol (OMNIPAQUE) 300 MG/ML solution 80 mL (80 mLs Intravenous Contrast Given 01/13/22 1753)  sodium chloride (PF) 0.9 % injection (  Given by Other 01/13/22 1826)  lactated ringers bolus 1,000 mL (1,000 mLs Intravenous New Bag/Given 01/13/22 1822)    ED Course/ Medical Decision Making/ A&P                           Medical Decision Making Amount and/or Complexity of Data Reviewed Labs: ordered. Radiology: ordered.  Risk Prescription drug management. Decision regarding hospitalization.   Patient presents with lower abdominal pain.  Is been gradual in onset.  She does have mild distention.  Patient has history of mild dysplastic syndrome.  She is nontoxic.  Vital signs are stable this time.  We will proceed with CT abdomen to rule out surgical etiology such as obstruction\diverticulitis\aortic aneurysm.  CT scan interpreted by radiology positive for diverticulitis.  No perforation or abscess at this time.  Patient does have risk factors of mild dysplastic syndrome and advanced age.  At this time we will plan for admission for continued hydration and antibiotic therapy.  Antibiotic started in the emergency department Rocephin and Flagyl.  Consult: Reviewed with Dr. Roel Cluck for admission.        Final Clinical Impression(s) / ED Diagnoses Final diagnoses:  Diverticulitis  Severe comorbid illness    Rx / DC Orders ED Discharge Orders     None         Charlesetta Shanks, MD 01/13/22 1850

## 2022-01-13 NOTE — Assessment & Plan Note (Signed)
Continue Rocephin and Flagyl  Bowel rest Rehydrate

## 2022-01-13 NOTE — ED Triage Notes (Signed)
Pt. BIB [facility] c/o Dysuria and urinary frequency. Pt. Is a cancer pt. She reports not having a BM in 2 days and a low appetite. EMS notes abdominal distention on assessment. Pt. Took 2 tylenol at around 1-2 pm for fever between 99.8 and 100.  EMS VS BP: 156/64  HR:84  RR: 16  O2:95%  CBG: 108

## 2022-01-13 NOTE — Plan of Care (Signed)
  Problem: Health Behavior/Discharge Planning: Goal: Ability to manage health-related needs will improve Outcome: Progressing   

## 2022-01-13 NOTE — Assessment & Plan Note (Signed)
-  chronic avoid nephrotoxic medications such as NSAIDs, Vanco Zosyn combo,  avoid hypotension, continue to follow renal function  

## 2022-01-13 NOTE — Assessment & Plan Note (Signed)
Chronic stable continue to monitor CBC well but cell count Hemoglobin at baseline transfuse as needed for hemoglobin approaching 7 Platelets at baseline patient is thrombocytopenic.  Avoid anticoagulation. Fall precautions Continue to follow outpatient with oncology hematology

## 2022-01-13 NOTE — Subjective & Objective (Signed)
Patient presents with dysuria urinary frequency, abdominal pain for the past 3 days Constipation has not had a bowel movement for the past few days very poor appetite EMS noted that her abdomen was distended she had a low-grade fever up to 99.8-100. Patient reports she has chronic cough which is mild since having COVID last summer.  No increased shortness of breath no chest pain.  She is still making urine.

## 2022-01-14 DIAGNOSIS — D539 Nutritional anemia, unspecified: Secondary | ICD-10-CM

## 2022-01-14 DIAGNOSIS — K5732 Diverticulitis of large intestine without perforation or abscess without bleeding: Secondary | ICD-10-CM | POA: Diagnosis not present

## 2022-01-14 DIAGNOSIS — R69 Illness, unspecified: Secondary | ICD-10-CM | POA: Diagnosis not present

## 2022-01-14 DIAGNOSIS — K5792 Diverticulitis of intestine, part unspecified, without perforation or abscess without bleeding: Secondary | ICD-10-CM | POA: Diagnosis not present

## 2022-01-14 DIAGNOSIS — D469 Myelodysplastic syndrome, unspecified: Secondary | ICD-10-CM | POA: Diagnosis not present

## 2022-01-14 DIAGNOSIS — D61818 Other pancytopenia: Secondary | ICD-10-CM

## 2022-01-14 LAB — COMPREHENSIVE METABOLIC PANEL
ALT: 15 U/L (ref 0–44)
AST: 15 U/L (ref 15–41)
Albumin: 3.2 g/dL — ABNORMAL LOW (ref 3.5–5.0)
Alkaline Phosphatase: 67 U/L (ref 38–126)
Anion gap: 6 (ref 5–15)
BUN: 22 mg/dL (ref 8–23)
CO2: 22 mmol/L (ref 22–32)
Calcium: 8.4 mg/dL — ABNORMAL LOW (ref 8.9–10.3)
Chloride: 110 mmol/L (ref 98–111)
Creatinine, Ser: 1.15 mg/dL — ABNORMAL HIGH (ref 0.44–1.00)
GFR, Estimated: 45 mL/min — ABNORMAL LOW (ref >60)
Glucose, Bld: 97 mg/dL (ref 70–99)
Potassium: 3.9 mmol/L (ref 3.5–5.1)
Sodium: 138 mmol/L (ref 135–145)
Total Bilirubin: 0.5 mg/dL (ref 0.3–1.2)
Total Protein: 6.2 g/dL — ABNORMAL LOW (ref 6.5–8.1)

## 2022-01-14 LAB — IRON AND TIBC
Iron: 27 ug/dL — ABNORMAL LOW (ref 28–170)
Saturation Ratios: 12 % (ref 10.4–31.8)
TIBC: 222 ug/dL — ABNORMAL LOW (ref 250–450)
UIBC: 195 ug/dL

## 2022-01-14 LAB — CBC
HCT: 25.1 % — ABNORMAL LOW (ref 36.0–46.0)
Hemoglobin: 7.9 g/dL — ABNORMAL LOW (ref 12.0–15.0)
MCH: 33.1 pg (ref 26.0–34.0)
MCHC: 31.5 g/dL (ref 30.0–36.0)
MCV: 105 fL — ABNORMAL HIGH (ref 80.0–100.0)
Platelets: 56 10*3/uL — ABNORMAL LOW (ref 150–400)
RBC: 2.39 MIL/uL — ABNORMAL LOW (ref 3.87–5.11)
RDW: 16.7 % — ABNORMAL HIGH (ref 11.5–15.5)
WBC: 3.1 10*3/uL — ABNORMAL LOW (ref 4.0–10.5)
nRBC: 0 % (ref 0.0–0.2)

## 2022-01-14 LAB — PREALBUMIN: Prealbumin: 13.7 mg/dL — ABNORMAL LOW (ref 18–38)

## 2022-01-14 LAB — BRAIN NATRIURETIC PEPTIDE: B Natriuretic Peptide: 92.6 pg/mL (ref 0.0–100.0)

## 2022-01-14 LAB — FERRITIN: Ferritin: 558 ng/mL — ABNORMAL HIGH (ref 11–307)

## 2022-01-14 LAB — VITAMIN B12: Vitamin B-12: 1179 pg/mL — ABNORMAL HIGH (ref 180–914)

## 2022-01-14 LAB — FOLATE: Folate: >40 ng/mL (ref >5.9)

## 2022-01-14 MED ORDER — ENSURE ENLIVE PO LIQD: 237.0000 mL | Freq: Two times a day (BID) | ORAL | Status: DC

## 2022-01-14 MED ORDER — AMOXICILLIN-POT CLAVULANATE 875-125 MG PO TABS
1.0000 | ORAL_TABLET | Freq: Two times a day (BID) | ORAL | Status: DC
  Administered 2022-01-14: 1 via ORAL
  Filled 2022-01-14: qty 1

## 2022-01-14 MED ORDER — AMOXICILLIN-POT CLAVULANATE 875-125 MG PO TABS: 1.0000 | ORAL_TABLET | Freq: Two times a day (BID) | ORAL | 0 refills | Status: AC

## 2022-01-14 NOTE — Evaluation (Signed)
Physical Therapy Evaluation Patient Details Name: Alison Bridges MRN: [id number] DOB: [birth year] Today's Date: 01/14/2022  History of Present Illness  Pt is a 88yo female presenting to [facility] from [facility] with dysuria, abdominal pain, constipation. CT revealed acute diverticulitis.   PMH: hx of bladder cancer, osteoporosis, hypothyroidism, CKD,  Clinical Impression  Pt presents with the problems listed above and functional impairments below. Patient evaluated by Physical Therapy with no further acute PT needs identified. Pt lives at independent living [facility] and is independent for mobility at baseline without any falls in the last six months. Pt was modified independent for bed mobility and transfers; min guard for ambulation in hallway 151f without AD. Pt is at her baseline from a mobility standpoint. All education has been completed and the patient has no further questions. See below for any follow-up Physical Therapy or equipment needs. PT is signing off. Thank you for this referral.        Recommendations for follow up therapy are one component of a multi-disciplinary discharge planning process, led by the attending physician.  Recommendations may be updated based on patient status, additional functional criteria and insurance authorization.  Follow Up Recommendations No PT follow up    Assistance Recommended at Discharge PRN  Patient can return home with the following  Assistance with cooking/housework;Assist for transportation;Help with stairs or ramp for entrance    Equipment Recommendations None recommended by PT  Recommendations for Other Services       Functional Status Assessment Patient has not had a recent decline in their functional status     Precautions / Restrictions Precautions Precautions: Fall Restrictions Weight Bearing Restrictions: No      Mobility  Bed Mobility Overal bed mobility: Modified Independent             General bed mobility  comments: Increased time.    Transfers Overall transfer level: Modified independent Equipment used: None               General transfer comment: Increased time, did not use hands to power up    Ambulation/Gait Ambulation/Gait assistance: Min guard Gait Distance (Feet): 120 Feet Assistive device: IV Pole, None Gait Pattern/deviations: WFL(Within Functional Limits) Gait velocity: decreased     General Gait Details: Pt ambulated 1270fwith min guard, started with IV pole for first 4030fthen progressed to without AD for remainder of ambulation task, no overt LOB noted, or physical assist required. Short, 30s standing rest break halfway.  Stairs            Wheelchair Mobility    Modified Rankin (Stroke Patients Only)       Balance Overall balance assessment: Needs assistance Sitting-balance support: Feet supported, No upper extremity supported Sitting balance-Leahy Scale: Good     Standing balance support: Single extremity supported, No upper extremity supported, During functional activity Standing balance-Leahy Scale: Good Standing balance comment: Pt stood without any UE support, then walked with IV pole, then without AD.                             Pertinent Vitals/Pain Pain Assessment Pain Assessment: No/denies pain    Home Living Family/patient expects to be discharged to:: Assisted living                 Home Equipment: Cane - quad;Grab bars - tub/shower Additional Comments: Pt reports she has not had a fall in the last six months,  does not use an AD.    Prior Function Prior Level of Function : Independent/Modified Independent             Mobility Comments: ind, has cane but does not yet ADLs Comments: ind     Hand Dominance   Dominant Hand: Right    Extremity/Trunk Assessment   Upper Extremity Assessment Upper Extremity Assessment: Overall WFL for tasks assessed;Defer to OT evaluation (Pt has general 4/5 strength and  functional ROM)    Lower Extremity Assessment Lower Extremity Assessment: RLE deficits/detail;LLE deficits/detail RLE Deficits / Details: Gross strength Hip/knee/ankle 4/5, functional ROM, intact sensation. RLE Sensation: WNL LLE Deficits / Details: Gross strength Hip/knee/ankle 4/5, functional ROM, intact sensation. LLE Sensation: WNL    Cervical / Trunk Assessment Cervical / Trunk Assessment: Kyphotic  Communication   Communication: No difficulties  Cognition Arousal/Alertness: Awake/alert Behavior During Therapy: WFL for tasks assessed/performed Overall Cognitive Status: Within Functional Limits for tasks assessed                                          General Comments      Exercises     Assessment/Plan    PT Assessment Patient does not need any further PT services  PT Problem List         PT Treatment Interventions      PT Goals (Current goals can be found in the Care Plan section)  Acute Rehab PT Goals Patient Stated Goal: To reduce abdominal pain PT Goal Formulation: With patient Time For Goal Achievement: 01/28/22 Potential to Achieve Goals: Good    Frequency       Co-evaluation               AM-PAC PT "6 Clicks" Mobility  Outcome Measure Help needed turning from your back to your side while in a flat bed without using bedrails?: None Help needed moving from lying on your back to sitting on the side of a flat bed without using bedrails?: None Help needed moving to and from a bed to a chair (including a wheelchair)?: A Little Help needed standing up from a chair using your arms (e.g., wheelchair or bedside chair)?: None Help needed to walk in hospital room?: A Little Help needed climbing 3-5 steps with a railing? : A Little 6 Click Score: 21    End of Session Equipment Utilized During Treatment: Gait belt Activity Tolerance: Patient tolerated treatment well;No increased pain Patient left: in chair;with call bell/phone within  reach;with chair alarm set Nurse Communication: Mobility status PT Visit Diagnosis: Other abnormalities of gait and mobility (R26.89)    Time: 0263-7858 PT Time Calculation (min) (ACUTE ONLY): 16 min   Charges:   PT Evaluation $PT Eval Low Complexity: 1 Low          Coolidge Breeze, PT, DPT [facility] Department Office: [phone] Pager: [phone]  Coolidge Breeze 01/14/2022, 11:36 AM

## 2022-01-14 NOTE — Discharge Summary (Signed)
Physician Discharge Summary  Alison Bridges XIP:[id number] DOB: [birth year] DOA: 01/13/2022  PCP: Burnard Bunting, MD  Admit date: 01/13/2022 Discharge date: 01/14/2022 Discharging to: home Recommendations for Outpatient Follow-up:  F/u oral intake  Consults:  none Procedures:  none   Discharge Diagnoses:   Principal Problem:   Diverticulitis Active Problems:   Pancytopenia ([place])   MDS (myelodysplastic syndrome) (HCC)   Macrocytic anemia   Stage 3b chronic kidney disease (CKD) [facility])     Hospital Course: This is a 88 year old female with history of myelodysplastic syndrome on supportive care.  She started having lower abdominal pain about 3 days ago.  Abdomen has been bloated for months. Her PCP obtained a CT for this which the patient states was unrevealing. She presented for abdominal pain and more bloating.   In the ED she was found to be tender in the lower abdomen and therefore a CT scan was ordered which revealed sigmoid diverticulitis.  She was started on Rocephin and Flagyl along with IV fluids and admitted to the hospital. She also complained of dysuria however UA was not consistent with an infection.    Principal Problem:   Diverticulitis - pain improved. Advanced to solid food which she is tolerating - as she has tolerated Augmentin in the past, will switch her to this- she is in agreement   Active Problems:   Pancytopenia (HCC)   MDS (myelodysplastic syndrome) (HCC)   Macrocytic anemia - cont oupt f/u    Stage 3b chronic kidney disease (CKD) ([place]) - stable            Discharge Instructions  Discharge Instructions     Diet - low sodium heart healthy   Complete by: As directed    Increase activity slowly   Complete by: As directed       Allergies as of 01/14/2022   No Known Allergies      Medication List     TAKE these medications    amoxicillin-clavulanate 875-125 MG tablet Commonly known as: AUGMENTIN Take 1 tablet by mouth 2 (two) times  daily for 6 days.   Aranesp (Albumin Free) 150 MCG/0.3ML Sosy injection Generic drug: Darbepoetin Alfa Inject 150 mcg into the skin every 30 (thirty) days. 12-19-21 last dose given   Azelastine HCl 137 MCG/SPRAY Soln Place into both nostrils.   b complex vitamins capsule Take 1 capsule by mouth daily.   cholecalciferol 1000 units tablet Commonly known as: VITAMIN D Take 1,000 Units by mouth daily.   Ferrous Fumarate 324 (106 Fe) MG Tabs tablet Commonly known as: HEMOCYTE - 106 mg FE Take 1 tablet by mouth daily.   levothyroxine 125 MCG tablet Commonly known as: SYNTHROID Take 125 mcg by mouth daily before breakfast.   loratadine 10 MG tablet Commonly known as: CLARITIN Take 10 mg by mouth daily.   multivitamin with minerals Tabs tablet Take 1 tablet by mouth daily.   pantoprazole 40 MG tablet Commonly known as: PROTONIX Take 40 mg by mouth as needed (acid reflux).   polyethylene glycol 17 g packet Commonly known as: MIRALAX / GLYCOLAX Take 17 g by mouth daily as needed for mild constipation.   SENOKOT PO Take 1 tablet by mouth at bedtime as needed.   sertraline 100 MG tablet Commonly known as: ZOLOFT Take 100 mg by mouth daily.   VITAMIN C PO Take 1 tablet by mouth daily.            The results of significant diagnostics from this hospitalization (  including imaging, microbiology, ancillary and laboratory) are listed below for reference.    CT Abdomen Pelvis W Contrast  Result Date: 01/13/2022 CLINICAL DATA:  Abdominal pain EXAM: CT ABDOMEN AND PELVIS WITH CONTRAST TECHNIQUE: Multidetector CT imaging of the abdomen and pelvis was performed using the standard protocol following bolus administration of intravenous contrast. RADIATION DOSE REDUCTION: This exam was performed according to the departmental dose-optimization program which includes automated exposure control, adjustment of the mA and/or kV according to patient size and/or use of iterative  reconstruction technique. CONTRAST:  70m OMNIPAQUE IOHEXOL 300 MG/ML  SOLN COMPARISON:  08/14/2020 FINDINGS: Lower chest: Small linear densities in the lower lung fields may suggest scarring or subsegmental atelectasis. Hepatobiliary: Liver measures 24.8 cm in length. No focal abnormality is seen. There is no dilation of bile ducts. Surgical clips are seen in gallbladder fossa. Pancreas: No focal abnormality is seen. Spleen: Spleen is enlarged measuring 15.5 cm in maximum diameter. Adrenals/Urinary Tract: Adrenals are unremarkable. There is no hydronephrosis. There are few small left renal stones largest measuring 5 mm. There are few bilateral renal cysts largest measuring 6.9 cm in the anterior midportion of left kidney. Urinary bladder is unremarkable. Stomach/Bowel: There is fluid in the lumen of lower thoracic esophagus suggesting gastroesophageal reflux. Stomach is not distended. Small bowel loops are not dilated. Cecum is lying to the left of midline. Appendix is not seen. Scattered diverticula are seen in the colon. There is wall thickening and pericolic stranding in and around sigmoid colon in the left side of pelvis. There is no loculated pericolic abscess. Vascular/Lymphatic: There are scattered arterial calcifications. Reproductive: Uterus is not seen. Other: There is no ascites or pneumoperitoneum. Musculoskeletal: There is grade 1 anterolisthesis at L5-S1 level. Degenerative changes are noted with disc space narrowing, bony spurs and facet hypertrophy at multiple levels. There is spinal stenosis and encroachment of neural foramina at multiple levels. IMPRESSION: Diverticulosis of colon. There is wall thickening along with pericolic stranding in the sigmoid colon in the left side of pelvis consistent with acute diverticulitis. There is no loculated pericolic abscess. There is no evidence of intestinal obstruction. There is no hydronephrosis. Marked gastroesophageal reflux. There are few nonobstructing  left renal stones. There are few bilateral renal cysts. Enlarged liver and spleen. Other findings as described in the body of the report. Electronically Signed   By: PElmer PickerM.D.   On: 01/13/2022 18:16   DG Chest Port 1 View  Result Date: 01/13/2022 CLINICAL DATA:  Cough, abdominal pain EXAM: PORTABLE CHEST 1 VIEW COMPARISON:  None Available. FINDINGS: Normal mediastinum and cardiac silhouette. Normal pulmonary vasculature. No evidence of effusion, infiltrate, or pneumothorax. No acute bony abnormality. IMPRESSION: No acute cardiopulmonary process. Electronically Signed   By: SSuzy BouchardM.D.   On: 01/13/2022 16:24   Labs:   Basic Metabolic Panel: Recent Labs  Lab 01/13/22 1601 01/13/22 1901 01/14/22 0530  NA 134*  --  138  K 4.6  --  3.9  CL 105  --  110  CO2 20*  --  22  GLUCOSE 101*  --  97  BUN 28*  --  22  CREATININE 1.31*  --  1.15*  CALCIUM 8.8*  --  8.4*  MG  --  2.0  --   PHOS  --  4.1  --      CBC: Recent Labs  Lab 01/13/22 1601 01/14/22 0530  WBC 4.3 3.1*  NEUTROABS 1.8  --   HGB 8.7* 7.9*  HCT 27.6*  25.1*  MCV 104.9* 105.0*  PLT 61* 56*         SIGNED:   Debbe Odea, MD  [facility] 01/14/2022, 3:06 PM

## 2022-01-14 NOTE — Progress Notes (Signed)
OT Cancellation Note  Patient Details Name: Alison Bridges MRN: [id number] DOB: [birth year]   Cancelled Treatment:    Reason Eval/Treat Not Completed: Other (comment) Patient was seen just after breakfast with patient attempting to vomit. Nurse made aware and nurse in room at this time. OT to continue to follow and check back later on today.  [name] OTR/L, [place] Acute Rehabilitation Department Office# [phone] Pager# [phone]  01/14/2022, 8:22 AM

## 2022-01-14 NOTE — Care Management CC44 (Signed)
Condition Code 44 Documentation Completed  Patient Details  Name: Alison Bridges MRN: [id number] Date of Birth: Sep 06, 1928   Condition Code 44 given:  Yes Patient signature on Condition Code 44 notice:  Yes Documentation of 2 MD's agreement:  Yes Code 44 added to claim:  Yes    Lynnell Catalan, RN 01/14/2022, 4:04 PM

## 2022-01-14 NOTE — Plan of Care (Signed)
  Problem: Education: Goal: Knowledge of General Education information will improve Description Including pain rating scale, medication(s)/side effects and non-pharmacologic comfort measures Outcome: Progressing   

## 2022-01-14 NOTE — Care Management Obs Status (Signed)
[facility] NOTIFICATION   Patient Details  Name: Alison Bridges MRN: [id number] Date of Birth: 05-Nov-1928   Medicare Observation Status Notification Given:  Yes    Lynnell Catalan, RN 01/14/2022, 4:04 PM

## 2022-01-14 NOTE — Evaluation (Addendum)
Occupational Therapy Evaluation Patient Details Name: Alison Bridges MRN: [id number] DOB: [birth year] Today's Date: 01/14/2022   History of Present Illness Pt is a 88yo female presenting to [facility] from [place] with dysuria, abdominal pain, constipation. CT revealed acute diverticulitis.   PMH: hx of bladder cancer, osteoporosis, hypothyroidism, CKD,   Clinical Impression   Patient evaluated by Occupational Therapy with no further acute OT needs identified. All education has been completed and the patient has no further questions. Patient is supervision for AdLs at this time with patient plans to d/c back to [facility].  See below for any follow-up Occupational Therapy or equipment needs. OT is signing off. Thank you for this referral.       Recommendations for follow up therapy are one component of a multi-disciplinary discharge planning process, led by the attending physician.  Recommendations may be updated based on patient status, additional functional criteria and insurance authorization.   Follow Up Recommendations  No OT follow up    Assistance Recommended at Discharge Frequent or constant Supervision/Assistance  Patient can return home with the following A little help with walking and/or transfers;A little help with bathing/dressing/bathroom;Assistance with cooking/housework;Direct supervision/assist for financial management;Assist for transportation;Help with stairs or ramp for entrance;Direct supervision/assist for medications management    Functional Status Assessment  Patient has not had a recent decline in their functional status  Equipment Recommendations  None recommended by OT    Recommendations for Other Services       Precautions / Restrictions Precautions Precautions: Fall Restrictions Weight Bearing Restrictions: No      Mobility Bed Mobility               General bed mobility comments: patient was in recliner and returned to the same     Transfers                          Balance Overall balance assessment: Needs assistance Sitting-balance support: Feet supported, No upper extremity supported Sitting balance-Leahy Scale: Good     Standing balance support: During functional activity, No upper extremity supported Standing balance-Leahy Scale: Good                             ADL either performed or assessed with clinical judgement   ADL Overall ADL's : At baseline                                       General ADL Comments: patient was supervision for ADL takss on this date. patient was min guard standig at sink with one LOB while brushing teeth when patient moved back to upright position from bending over to rinse mouth.  patient was able to regain standing balance with CGA. patient was educated on not using wic and having staff take her to the bathroom. patient verbalized understanding but did not let therpist remove wic at this time. nurse made aware.     Vision Baseline Vision/History: 1 Wears glasses Patient Visual Report: No change from baseline       Perception     Praxis      Pertinent Vitals/Pain Pain Assessment Pain Assessment: No/denies pain     Hand Dominance Right   Extremity/Trunk Assessment Upper Extremity Assessment Upper Extremity Assessment: Overall WFL for tasks assessed   Lower Extremity Assessment Lower  Extremity Assessment: Defer to PT evaluation RLE Deficits / Details: Gross strength Hip/knee/ankle 4/5, functional ROM, intact sensation. RLE Sensation: WNL LLE Deficits / Details: Gross strength Hip/knee/ankle 4/5, functional ROM, intact sensation. LLE Sensation: WNL   Cervical / Trunk Assessment Cervical / Trunk Assessment: Kyphotic   Communication Communication Communication: No difficulties   Cognition Arousal/Alertness: Awake/alert Behavior During Therapy: WFL for tasks assessed/performed Overall Cognitive Status: Within  Functional Limits for tasks assessed                                       General Comments       Exercises     Shoulder Instructions      Home Living Family/patient expects to be discharged to:: Assisted living                             Home Equipment: Cane - quad;Grab bars - tub/shower   Additional Comments: Pt reports she has not had a fall in the last six months, does not use an AD.      Prior Functioning/Environment Prior Level of Function : Independent/Modified Independent             Mobility Comments: ind, has cane but does not yet ADLs Comments: ind        OT Problem List:        OT Treatment/Interventions:      OT Goals(Current goals can be found in the care plan section) Acute Rehab OT Goals OT Goal Formulation: All assessment and education complete, DC therapy  OT Frequency:      Co-evaluation              AM-PAC OT "6 Clicks" Daily Activity     Outcome Measure Help from another person eating meals?: None Help from another person taking care of personal grooming?: A Little Help from another person toileting, which includes using toliet, bedpan, or urinal?: A Little Help from another person bathing (including washing, rinsing, drying)?: A Little Help from another person to put on and taking off regular upper body clothing?: A Little Help from another person to put on and taking off regular lower body clothing?: A Little 6 Click Score: 19   End of Session Nurse Communication: Mobility status  Activity Tolerance: Patient tolerated treatment well Patient left: in chair;with call bell/phone within reach;with chair alarm set  OT Visit Diagnosis: Unsteadiness on feet (R26.81)                Time: 8527-7824 OT Time Calculation (min): 21 min Charges:  OT General Charges $OT Visit: 1 Visit OT Evaluation $OT Eval Low Complexity: 1 Low  Leota Sauers, MS Acute Rehabilitation Department Office#  [phone] Pager# [phone]   Marcellina Millin 01/14/2022, 1:36 PM

## 2022-01-15 ENCOUNTER — Other Ambulatory Visit: Payer: Self-pay

## 2022-01-15 DIAGNOSIS — D469 Myelodysplastic syndrome, unspecified: Secondary | ICD-10-CM

## 2022-01-15 LAB — URINE CULTURE: Culture: NO GROWTH

## 2022-01-16 ENCOUNTER — Inpatient Hospital Stay: Payer: Medicare Other

## 2022-01-16 ENCOUNTER — Other Ambulatory Visit: Payer: Self-pay

## 2022-01-16 ENCOUNTER — Inpatient Hospital Stay: Payer: Medicare Other | Attending: Hematology

## 2022-01-16 VITALS — BP 121/54 | HR 89 | Temp 98.7°F | Resp 18

## 2022-01-16 DIAGNOSIS — Z298 Encounter for other specified prophylactic measures: Secondary | ICD-10-CM | POA: Insufficient documentation

## 2022-01-16 DIAGNOSIS — D539 Nutritional anemia, unspecified: Secondary | ICD-10-CM

## 2022-01-16 DIAGNOSIS — D46Z Other myelodysplastic syndromes: Secondary | ICD-10-CM | POA: Diagnosis not present

## 2022-01-16 DIAGNOSIS — D469 Myelodysplastic syndrome, unspecified: Secondary | ICD-10-CM

## 2022-01-16 LAB — CMP (CANCER CENTER ONLY)
ALT: 18 U/L (ref 0–44)
AST: 24 U/L (ref 15–41)
Albumin: 3.9 g/dL (ref 3.5–5.0)
Alkaline Phosphatase: 78 U/L (ref 38–126)
Anion gap: 6 (ref 5–15)
BUN: 20 mg/dL (ref 8–23)
CO2: 21 mmol/L — ABNORMAL LOW (ref 22–32)
Calcium: 8.9 mg/dL (ref 8.9–10.3)
Chloride: 108 mmol/L (ref 98–111)
Creatinine: 1.27 mg/dL — ABNORMAL HIGH (ref 0.44–1.00)
GFR, Estimated: 40 mL/min — ABNORMAL LOW (ref >60)
Glucose, Bld: 125 mg/dL — ABNORMAL HIGH (ref 70–99)
Potassium: 4.1 mmol/L (ref 3.5–5.1)
Sodium: 135 mmol/L (ref 135–145)
Total Bilirubin: 0.4 mg/dL (ref 0.3–1.2)
Total Protein: 7 g/dL (ref 6.5–8.1)

## 2022-01-16 LAB — CBC WITH DIFFERENTIAL (CANCER CENTER ONLY)
Abs Immature Granulocytes: 0.03 10*3/uL (ref 0.00–0.07)
Basophils Absolute: 0 10*3/uL (ref 0.0–0.1)
Basophils Relative: 0 %
Eosinophils Absolute: 0 10*3/uL (ref 0.0–0.5)
Eosinophils Relative: 0 %
HCT: 26.8 % — ABNORMAL LOW (ref 36.0–46.0)
Hemoglobin: 8.6 g/dL — ABNORMAL LOW (ref 12.0–15.0)
Immature Granulocytes: 2 %
Lymphocytes Relative: 34 %
Lymphs Abs: 0.7 10*3/uL (ref 0.7–4.0)
MCH: 33 pg (ref 26.0–34.0)
MCHC: 32.1 g/dL (ref 30.0–36.0)
MCV: 102.7 fL — ABNORMAL HIGH (ref 80.0–100.0)
Monocytes Absolute: 0.7 10*3/uL (ref 0.1–1.0)
Monocytes Relative: 36 %
Neutro Abs: 0.6 10*3/uL — ABNORMAL LOW (ref 1.7–7.7)
Neutrophils Relative %: 28 %
Platelet Count: 67 10*3/uL — ABNORMAL LOW (ref 150–400)
RBC: 2.61 MIL/uL — ABNORMAL LOW (ref 3.87–5.11)
RDW: 16.5 % — ABNORMAL HIGH (ref 11.5–15.5)
WBC Count: 2 10*3/uL — ABNORMAL LOW (ref 4.0–10.5)
nRBC: 0 % (ref 0.0–0.2)

## 2022-01-16 MED ORDER — DARBEPOETIN ALFA 150 MCG/0.3ML IJ SOSY
150.0000 ug | PREFILLED_SYRINGE | Freq: Once | INTRAMUSCULAR | Status: AC
  Administered 2022-01-16: 150 ug via SUBCUTANEOUS
  Filled 2022-01-16: qty 0.3

## 2022-01-28 ENCOUNTER — Ambulatory Visit
Admission: RE | Admit: 2022-01-28 | Discharge: 2022-01-28 | Disposition: A | Discharge status: Home / Self Care | Payer: Medicare Other | Source: Ambulatory Visit | Attending: Internal Medicine | Admitting: Internal Medicine

## 2022-01-28 ENCOUNTER — Other Ambulatory Visit: Payer: Self-pay | Admitting: Internal Medicine

## 2022-01-28 DIAGNOSIS — R1030 Lower abdominal pain, unspecified: Secondary | ICD-10-CM | POA: Diagnosis not present

## 2022-01-28 DIAGNOSIS — K5792 Diverticulitis of intestine, part unspecified, without perforation or abscess without bleeding: Secondary | ICD-10-CM

## 2022-01-28 DIAGNOSIS — K5732 Diverticulitis of large intestine without perforation or abscess without bleeding: Secondary | ICD-10-CM | POA: Diagnosis not present

## 2022-01-28 DIAGNOSIS — R188 Other ascites: Secondary | ICD-10-CM | POA: Diagnosis not present

## 2022-01-28 DIAGNOSIS — R197 Diarrhea, unspecified: Secondary | ICD-10-CM | POA: Diagnosis not present

## 2022-01-28 DIAGNOSIS — D469 Myelodysplastic syndrome, unspecified: Secondary | ICD-10-CM | POA: Diagnosis not present

## 2022-01-28 DIAGNOSIS — K219 Gastro-esophageal reflux disease without esophagitis: Secondary | ICD-10-CM | POA: Diagnosis not present

## 2022-01-28 DIAGNOSIS — N200 Calculus of kidney: Secondary | ICD-10-CM | POA: Diagnosis not present

## 2022-01-28 DIAGNOSIS — R161 Splenomegaly, not elsewhere classified: Secondary | ICD-10-CM | POA: Diagnosis not present

## 2022-01-28 MED ORDER — IOPAMIDOL (ISOVUE-300) INJECTION 61%
100.0000 mL | Freq: Once | INTRAVENOUS | Status: AC | PRN
  Administered 2022-01-28: 60 mL via INTRAVENOUS

## 2022-02-06 ENCOUNTER — Telehealth: Payer: Self-pay | Admitting: Hematology

## 2022-02-06 NOTE — Telephone Encounter (Signed)
Rescheduled upcoming appointment due to provider's PAL. Patient is aware of changes. ?

## 2022-02-11 ENCOUNTER — Other Ambulatory Visit: Payer: Self-pay

## 2022-02-11 DIAGNOSIS — D469 Myelodysplastic syndrome, unspecified: Secondary | ICD-10-CM

## 2022-02-12 ENCOUNTER — Inpatient Hospital Stay (HOSPITAL_BASED_OUTPATIENT_CLINIC_OR_DEPARTMENT_OTHER): Payer: Medicare Other | Admitting: Physician Assistant

## 2022-02-12 ENCOUNTER — Other Ambulatory Visit: Payer: Self-pay

## 2022-02-12 ENCOUNTER — Inpatient Hospital Stay: Payer: Medicare Other

## 2022-02-12 ENCOUNTER — Inpatient Hospital Stay: Payer: Medicare Other | Attending: Hematology

## 2022-02-12 VITALS — BP 133/52 | HR 83 | Temp 97.3°F | Resp 16 | Wt 129.7 lb

## 2022-02-12 DIAGNOSIS — D469 Myelodysplastic syndrome, unspecified: Secondary | ICD-10-CM | POA: Diagnosis not present

## 2022-02-12 DIAGNOSIS — D61818 Other pancytopenia: Secondary | ICD-10-CM

## 2022-02-12 DIAGNOSIS — D539 Nutritional anemia, unspecified: Secondary | ICD-10-CM

## 2022-02-12 DIAGNOSIS — D46Z Other myelodysplastic syndromes: Secondary | ICD-10-CM | POA: Insufficient documentation

## 2022-02-12 LAB — CMP (CANCER CENTER ONLY)
ALT: 16 U/L (ref 0–44)
AST: 16 U/L (ref 15–41)
Albumin: 4.1 g/dL (ref 3.5–5.0)
Alkaline Phosphatase: 65 U/L (ref 38–126)
Anion gap: 7 (ref 5–15)
BUN: 25 mg/dL — ABNORMAL HIGH (ref 8–23)
CO2: 25 mmol/L (ref 22–32)
Calcium: 9.2 mg/dL (ref 8.9–10.3)
Chloride: 103 mmol/L (ref 98–111)
Creatinine: 1.31 mg/dL — ABNORMAL HIGH (ref 0.44–1.00)
GFR, Estimated: 39 mL/min — ABNORMAL LOW (ref >60)
Glucose, Bld: 109 mg/dL — ABNORMAL HIGH (ref 70–99)
Potassium: 4.1 mmol/L (ref 3.5–5.1)
Sodium: 135 mmol/L (ref 135–145)
Total Bilirubin: 0.4 mg/dL (ref 0.3–1.2)
Total Protein: 7 g/dL (ref 6.5–8.1)

## 2022-02-12 LAB — CBC WITH DIFFERENTIAL (CANCER CENTER ONLY)
Abs Immature Granulocytes: 0.04 10*3/uL (ref 0.00–0.07)
Basophils Absolute: 0 10*3/uL (ref 0.0–0.1)
Basophils Relative: 0 %
Eosinophils Absolute: 0 10*3/uL (ref 0.0–0.5)
Eosinophils Relative: 0 %
HCT: 27.3 % — ABNORMAL LOW (ref 36.0–46.0)
Hemoglobin: 8.8 g/dL — ABNORMAL LOW (ref 12.0–15.0)
Immature Granulocytes: 2 %
Lymphocytes Relative: 36 %
Lymphs Abs: 0.9 10*3/uL (ref 0.7–4.0)
MCH: 33.3 pg (ref 26.0–34.0)
MCHC: 32.2 g/dL (ref 30.0–36.0)
MCV: 103.4 fL — ABNORMAL HIGH (ref 80.0–100.0)
Monocytes Absolute: 1 10*3/uL (ref 0.1–1.0)
Monocytes Relative: 39 %
Neutro Abs: 0.6 10*3/uL — ABNORMAL LOW (ref 1.7–7.7)
Neutrophils Relative %: 23 %
Platelet Count: 73 10*3/uL — ABNORMAL LOW (ref 150–400)
RBC: 2.64 MIL/uL — ABNORMAL LOW (ref 3.87–5.11)
RDW: 16.6 % — ABNORMAL HIGH (ref 11.5–15.5)
WBC Count: 2.6 10*3/uL — ABNORMAL LOW (ref 4.0–10.5)
nRBC: 0 % (ref 0.0–0.2)

## 2022-02-12 LAB — SAMPLE TO BLOOD BANK

## 2022-02-12 MED ORDER — DARBEPOETIN ALFA 150 MCG/0.3ML IJ SOSY
150.0000 ug | PREFILLED_SYRINGE | Freq: Once | INTRAMUSCULAR | Status: AC
  Administered 2022-02-12: 150 ug via SUBCUTANEOUS
  Filled 2022-02-12: qty 0.3

## 2022-02-12 NOTE — Patient Instructions (Signed)
Darbepoetin Alfa injection ?What is this medication? ?DARBEPOETIN ALFA (dar be POE e tin  AL fa) helps your body make more red blood cells. It is used to treat anemia caused by chronic kidney failure and chemotherapy. ?This medicine may be used for other purposes; ask your health care provider or pharmacist if you have questions. ?COMMON BRAND NAME(S): Aranesp ?What should I tell my care team before I take this medication? ?They need to know if you have any of these conditions: ?blood clotting disorders or history of blood clots ?cancer patient not on chemotherapy ?cystic fibrosis ?heart disease, such as angina, heart failure, or a history of a heart attack ?hemoglobin level of 12 g/dL or greater ?high blood pressure ?low levels of folate, iron, or vitamin B12 ?seizures ?an unusual or allergic reaction to darbepoetin, erythropoietin, albumin, hamster proteins, latex, other medicines, foods, dyes, or preservatives ?pregnant or trying to get pregnant ?breast-feeding ?How should I use this medication? ?This medicine is for injection into a vein or under the skin. It is usually given by a health care professional in a hospital or clinic setting. ?If you get this medicine at home, you will be taught how to prepare and give this medicine. Use exactly as directed. Take your medicine at regular intervals. Do not take your medicine more often than directed. ?It is important that you put your used needles and syringes in a special sharps container. Do not put them in a trash can. If you do not have a sharps container, call your pharmacist or healthcare provider to get one. ?A special MedGuide will be given to you by the pharmacist with each prescription and refill. Be sure to read this information carefully each time. ?Talk to your pediatrician regarding the use of this medicine in children. While this medicine may be used in children as young as 1 month of age for selected conditions, precautions do apply. ?Overdosage: If  you think you have taken too much of this medicine contact a poison control center or emergency room at once. ?NOTE: This medicine is only for you. Do not share this medicine with others. ?What if I miss a dose? ?If you miss a dose, take it as soon as you can. If it is almost time for your next dose, take only that dose. Do not take double or extra doses. ?What may interact with this medication? ?Do not take this medicine with any of the following medications: ?epoetin alfa ?This list may not describe all possible interactions. Give your health care provider a list of all the medicines, herbs, non-prescription drugs, or dietary supplements you use. Also tell them if you smoke, drink alcohol, or use illegal drugs. Some items may interact with your medicine. ?What should I watch for while using this medication? ?Your condition will be monitored carefully while you are receiving this medicine. ?You may need blood work done while you are taking this medicine. ?This medicine may cause a decrease in vitamin B6. You should make sure that you get enough vitamin B6 while you are taking this medicine. Discuss the foods you eat and the vitamins you take with your health care professional. ?What side effects may I notice from receiving this medication? ?Side effects that you should report to your doctor or health care professional as soon as possible: ?allergic reactions like skin rash, itching or hives, swelling of the face, lips, or tongue ?breathing problems ?changes in vision ?chest pain ?confusion, trouble speaking or understanding ?feeling faint or lightheaded, falls ?high blood   pressure ?muscle aches or pains ?pain, swelling, warmth in the leg ?rapid weight gain ?severe headaches ?sudden numbness or weakness of the face, arm or leg ?trouble walking, dizziness, loss of balance or coordination ?seizures (convulsions) ?swelling of the ankles, feet, hands ?unusually weak or tired ?Side effects that usually do not require  medical attention (report to your doctor or health care professional if they continue or are bothersome): ?diarrhea ?fever, chills (flu-like symptoms) ?headaches ?nausea, vomiting ?redness, stinging, or swelling at site where injected ?This list may not describe all possible side effects. Call your doctor for medical advice about side effects. You may report side effects to FDA at 1-800-FDA-1088. ?Where should I keep my medication? ?Keep out of the reach of children and pets. ?Store in a refrigerator. Do not freeze. Do not shake. Throw away any unused portion if using a single-dose vial. Multi-dose vials can be kept in the refrigerator for up to 21 days after the initial dose. Throw away unused medicine. ?To get rid of medications that are no longer needed or have expired: ?Take the medication to a medication take-back program. Check with your pharmacy or law enforcement to find a location. ?If you cannot return the medication, ask your pharmacist or care team how to get rid of the medication safely. ?NOTE: This sheet is a summary. It may not cover all possible information. If you have questions about this medicine, talk to your doctor, pharmacist, or health care provider. ?? 2023 [employer]/Gold Standard (2021-07-23 00:00:00) ? ?

## 2022-02-12 NOTE — Progress Notes (Signed)
HEMATOLOGY/ONCOLOGY CLINIC NOTE  Date of Service: .02/12/2022   Patient Care Team: Burnard Bunting, MD as PCP - General (Internal Medicine) Lovena Neighbours Conception Oms, MD as Consulting Physician (Urology)  REFERRING PHYSICIAN: Burnard Bunting, MD  CHIEF COMPLAINTS/PURPOSE OF CONSULTATION:  Follow-up for continued management of pancytopenia from MDS  HISTORY OF PRESENTING ILLNESS: Please see previous notes for details on initial presentation  INTERVAL HISTORY:  Alison Bridges is a wonderful 88 y.o. 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 She was last seen by Dr. Juanetta Negash Limbo on 10/17/2021. In the interim, patient was hospitalized in June 2023 for sigmoid diverticulitis and more recently diagnosed with C.diff infection.   Ms. Stallsmith reports that she is going to complete her antibiotics for C. difficile infection tomorrow.  She reports that her diarrhea has resolved and her bowel habits are back to baseline.  Continues to have fatigue and feels lethargic.  She is able to complete her daily routines on her own.  She resides at [facility].  She denies nausea, vomiting or abdominal pain.  She denies easy bruising or signs of active bleeding.  Patient does have some shortness of breath with exertion since prior diagnosis of COVID.  She adds having periodic episodes of a productive cough with clear mucus.  She denies any fevers, chills, night sweats, chest pain or cough.  She has no other complaints.   MEDICAL HISTORY:  Past Medical History:  Diagnosis Date   Cancer ([place])    Depression    Hypothyroidism    Myelodysplastic disease ([place])    Osteoporosis    SURGICAL HISTORY: Past Surgical History:  Procedure Laterality Date   ABDOMINAL HYSTERECTOMY     APPENDECTOMY  1998   CHOLECYSTECTOMY  1998   SOCIAL HISTORY: Social History   Socioeconomic History   Marital status:  Widowed    Spouse name: Not on file   Number of children: Not on file   Years of education: Not on file   Highest education level: Not on file  Occupational History   Occupation: Oncologist    Comment: Retired  Tobacco Use   Smoking status: Never   Smokeless tobacco: Never  Substance and Sexual Activity   Alcohol use: No   Drug use: No   Sexual activity: Not on file  Other Topics Concern   Not on file  Social History Narrative   She is tried to stay active --  2 days a week she does senior aerobics.  She also enjoys going for walks, but no standard routine.      She has 3 children and 2 grandchildren.   Social Determinants of Health   Financial Resource Strain: Not on file  Food Insecurity: No Food Insecurity (02/29/2020)   Hunger Vital Sign    Worried About Running Out of Food in the Last Year: Never true    Ran Out of Food in the Last Year: Never true  Transportation Needs: No Transportation Needs (02/29/2020)   PRAPARE - Hydrologist (Medical): No    Lack of Transportation (Non-Medical): No  Physical Activity: Not on file  Stress: Not on file  Social Connections: Not on file  Intimate Partner Violence: Not on file     FAMILY HISTORY: Family History  Problem Relation Age of Onset   Heart attack Mother 59   Pancreatic cancer Father    Parkinson's  disease Sister    Diabetes Brother        Multiple complications   Heart disease Brother        Not sure what happened   Dementia Brother      ALLERGIES:   has No Known Allergies.   MEDICATIONS:  Current Outpatient Medications  Medication Sig Dispense Refill   Ascorbic Acid (VITAMIN C PO) Take 1 tablet by mouth daily.     Azelastine HCl 137 MCG/SPRAY SOLN Place into both nostrils.     b complex vitamins capsule Take 1 capsule by mouth daily.     cholecalciferol (VITAMIN D) 1000 UNITS tablet Take 1,000 Units by mouth daily.     Darbepoetin Alfa (ARANESP, ALBUMIN FREE,) 150  MCG/0.3ML SOSY injection Inject 150 mcg into the skin every 30 (thirty) days. 12-19-21 last dose given     FIDAXOMICIN PO Take 200 mg by mouth.     levothyroxine (SYNTHROID, LEVOTHROID) 125 MCG tablet Take 125 mcg by mouth daily before breakfast.     Multiple Vitamin (MULTIVITAMIN WITH MINERALS) TABS tablet Take 1 tablet by mouth daily.     pantoprazole (PROTONIX) 40 MG tablet Take 40 mg by mouth as needed (acid reflux).     sertraline (ZOLOFT) 100 MG tablet Take 100 mg by mouth daily.     Ferrous Fumarate (HEMOCYTE - 106 MG FE) 324 (106 Fe) MG TABS tablet Take 1 tablet by mouth daily. (Patient not taking: Reported on 02/12/2022)     loratadine (CLARITIN) 10 MG tablet Take 10 mg by mouth daily. (Patient not taking: Reported on 02/12/2022)     polyethylene glycol (MIRALAX / GLYCOLAX) 17 g packet Take 17 g by mouth daily as needed for mild constipation. (Patient not taking: Reported on 02/12/2022)     Sennosides (SENOKOT PO) Take 1 tablet by mouth at bedtime as needed. (Patient not taking: Reported on 02/12/2022)     No current facility-administered medications for this visit.   Facility-Administered Medications Ordered in Other Visits  Medication Dose Route Frequency Provider Last Rate Last Admin   Darbepoetin Alfa (ARANESP) injection 150 mcg  150 mcg Subcutaneous Once [name], MD         REVIEW OF SYSTEMS:   10 Point review of Systems was done is negative except as noted above.  PHYSICAL EXAMINATION: ECOG PERFORMANCE STATUS: 2 - Symptomatic, <50% confined to bed  Vitals:   02/12/22 0946  BP: (!) 133/52  Pulse: 83  Resp: 16  Temp: (!) 97.3 F (36.3 C)  SpO2: 95%   Filed Weights   02/12/22 0946  Weight: 129 lb 11.2 oz (58.8 kg)   Body mass index is 19.72 kg/m. GENERAL:alert, in no acute distress and comfortable SKIN: no acute rashes, no significant lesions EYES: conjunctiva are pink and non-injected, sclera anicteric OROPHARYNX: MMM, no exudates, no oropharyngeal  erythema or ulceration NECK: supple, no JVD LUNGS: clear to auscultation b/l with normal respiratory effort HEART: regular rate & rhythm Extremity: no pedal edema PSYCH: alert & oriented x 3 with fluent speech NEURO: no focal motor/sensory deficits  LABORATORY DATA:  I have reviewed the data as listed     Latest Ref Rng & Units 02/12/2022    9:29 AM 01/16/2022    9:20 AM 01/14/2022    5:30 AM  CBC  WBC 4.0 - 10.5 K/uL 2.6  2.0  3.1   Hemoglobin 12.0 - 15.0 g/dL 8.8  8.6  7.9   Hematocrit 36.0 - 46.0 % 27.3  26.8  25.1   Platelets 150 - 400 K/uL 73  67  56   ANC 700     Latest Ref Rng & Units 02/12/2022    9:29 AM 01/16/2022    9:20 AM 01/14/2022    5:30 AM  CMP  Glucose 70 - 99 mg/dL 109  125  97   BUN 8 - 23 mg/dL '25  20  22   '$ Creatinine 0.44 - 1.00 mg/dL 1.31  1.27  1.15   Sodium 135 - 145 mmol/L 135  135  138   Potassium 3.5 - 5.1 mmol/L 4.1  4.1  3.9   Chloride 98 - 111 mmol/L 103  108  110   CO2 22 - 32 mmol/L '25  21  22   '$ Calcium 8.9 - 10.3 mg/dL 9.2  8.9  8.4   Total Protein 6.5 - 8.1 g/dL 7.0  7.0  6.2   Total Bilirubin 0.3 - 1.2 mg/dL 0.4  0.4  0.5   Alkaline Phos 38 - 126 U/L 65  78  67   AST 15 - 41 U/L '16  24  15   '$ ALT 0 - 44 U/L '16  18  15    '$ . Lab Results  Component Value Date   IRON 27 (L) 01/13/2022   TIBC 222 (L) 01/13/2022   IRONPCTSAT 12 01/13/2022   (Iron and TIBC)  Lab Results  Component Value Date   FERRITIN 558 (H) 01/13/2022     01/10/2020 Cytogenetics [phone]):    01/10/2020 FISH Panel ([id number]):   01/10/2020 BM Bx Report ([id number]):    12/16/2019 JAK2, MPL, CALR Panel Report:    RADIOGRAPHIC STUDIES: I have personally reviewed the radiological images as listed and agreed with the findings in the report. CT ABDOMEN PELVIS W CONTRAST  Result Date: 01/28/2022 CLINICAL DATA:  Left lower quadrant pain. Diarrhea. Diverticulitis. EXAM: CT ABDOMEN AND PELVIS WITH CONTRAST TECHNIQUE: Multidetector CT imaging of the  abdomen and pelvis was performed using the standard protocol following bolus administration of intravenous contrast. RADIATION DOSE REDUCTION: This exam was performed according to the departmental dose-optimization program which includes automated exposure control, adjustment of the mA and/or kV according to patient size and/or use of iterative reconstruction technique. CONTRAST:  3m ISOVUE-300 IOPAMIDOL (ISOVUE-300) INJECTION 61% COMPARISON:  01/13/2022 and 03/22/2020 FINDINGS: Lower Chest: No acute findings. Hepatobiliary: No hepatic masses identified. Prior cholecystectomy. No evidence of biliary obstruction. Pancreas:  No mass or inflammatory changes. Spleen: Stable moderate splenomegaly.  No splenic masses identified. Adrenals/Urinary Tract: No masses identified. Stable bilateral renal cysts including a small hemorrhagic cyst in the lower pole of the left kidney (no followup imaging recommended). Several tiny less than 5 mm left renal calculi are again seen. No evidence of ureteral calculi or hydronephrosis. Stomach/Bowel: Patulous lower thoracic esophagus which contains fluid is unchanged in appearance. There has been resolution of sigmoid diverticulitis since prior study. Other inflammatory process or abnormal fluid collections are seen. No evidence of bowel obstruction. Vascular/Lymphatic: No pathologically enlarged lymph nodes. No acute vascular findings. Aortic atherosclerotic calcification incidentally noted. Reproductive: Prior hysterectomy noted. Adnexal regions are unremarkable in appearance. Other:  None. Musculoskeletal:  No suspicious bone lesions identified. IMPRESSION: Resolution of sigmoid diverticulitis since prior study. No evidence of abscess or other acute findings. Stable splenomegaly. Left nephrolithiasis. No evidence of ureteral calculi or hydronephrosis. Aortic Atherosclerosis (ICD10-I70.0). Electronically Signed   By: JMarlaine HindM.D.   On: 01/28/2022 15:18   CT Abdomen Pelvis W  Contrast  Result Date:  01/13/2022 CLINICAL DATA:  Abdominal pain EXAM: CT ABDOMEN AND PELVIS WITH CONTRAST TECHNIQUE: Multidetector CT imaging of the abdomen and pelvis was performed using the standard protocol following bolus administration of intravenous contrast. RADIATION DOSE REDUCTION: This exam was performed according to the departmental dose-optimization program which includes automated exposure control, adjustment of the mA and/or kV according to patient size and/or use of iterative reconstruction technique. CONTRAST:  53m OMNIPAQUE IOHEXOL 300 MG/ML  SOLN COMPARISON:  08/14/2020 FINDINGS: Lower chest: Small linear densities in the lower lung fields may suggest scarring or subsegmental atelectasis. Hepatobiliary: Liver measures 24.8 cm in length. No focal abnormality is seen. There is no dilation of bile ducts. Surgical clips are seen in gallbladder fossa. Pancreas: No focal abnormality is seen. Spleen: Spleen is enlarged measuring 15.5 cm in maximum diameter. Adrenals/Urinary Tract: Adrenals are unremarkable. There is no hydronephrosis. There are few small left renal stones largest measuring 5 mm. There are few bilateral renal cysts largest measuring 6.9 cm in the anterior midportion of left kidney. Urinary bladder is unremarkable. Stomach/Bowel: There is fluid in the lumen of lower thoracic esophagus suggesting gastroesophageal reflux. Stomach is not distended. Small bowel loops are not dilated. Cecum is lying to the left of midline. Appendix is not seen. Scattered diverticula are seen in the colon. There is wall thickening and pericolic stranding in and around sigmoid colon in the left side of pelvis. There is no loculated pericolic abscess. Vascular/Lymphatic: There are scattered arterial calcifications. Reproductive: Uterus is not seen. Other: There is no ascites or pneumoperitoneum. Musculoskeletal: There is grade 1 anterolisthesis at L5-S1 level. Degenerative changes are noted with disc space  narrowing, bony spurs and facet hypertrophy at multiple levels. There is spinal stenosis and encroachment of neural foramina at multiple levels. IMPRESSION: Diverticulosis of colon. There is wall thickening along with pericolic stranding in the sigmoid colon in the left side of pelvis consistent with acute diverticulitis. There is no loculated pericolic abscess. There is no evidence of intestinal obstruction. There is no hydronephrosis. Marked gastroesophageal reflux. There are few nonobstructing left renal stones. There are few bilateral renal cysts. Enlarged liver and spleen. Other findings as described in the body of the report. Electronically Signed   By: PElmer PickerM.D.   On: 01/13/2022 18:16   DG Chest Port 1 View  Result Date: 01/13/2022 CLINICAL DATA:  Cough, abdominal pain EXAM: PORTABLE CHEST 1 VIEW COMPARISON:  None Available. FINDINGS: Normal mediastinum and cardiac silhouette. Normal pulmonary vasculature. No evidence of effusion, infiltrate, or pneumothorax. No acute bony abnormality. IMPRESSION: No acute cardiopulmonary process. Electronically Signed   By: SSuzy BouchardM.D.   On: 01/13/2022 16:24     ASSESSMENT & PLAN:   Alison WINIARSKIis a 88y.o. female with:   Pancytopenia - likely from MDS +/- MPN -01/10/2020 BM Bx Surgical Pathology Report ([id number]) revealed "BONE MARROW, ASPIRATE, CLOT, CORE: -Hypercellular marrow with myeloid hyperplasia and megakaryocytic atypia  PERIPHERAL BLOOD: -  Pancytopenia". -12/16/19 JAK2 (including V617 and Exon 12), MPL, and CALR-Next Generation Sequencing shows "Tier II: Variants of Potential Clinical Significance - SRSF2 p.Pro95His"  2.  Hepatosplenomegaly likely from her MDS/MPN noted on CT abdomen pelvis January 2022  PLAN: -Labs done today were reviewed in detail with the patient. WBC 2.6, Hgb 8.8, Plt 73K. Overall stable.  -No need for blood transfusion at this time. -Continue with Aranesp injection at 150 mg every 4 weeks  with goal Hgb ? 10. -Avoid NSAIDs or other medications that can worsen  thrombocytopenia. -Continue daily vitamin B complex -Neutropenic precautions recommended -Patient will complete abx tomorrow and f/u with PCP to repeat stool studies to confirm that C.diff has been eradicated.   FOLLOW UP: -03/13/2022: labs and Aranesp injection -04/10/2022: labs and Aranesp injection -05/08/2022: labs, F/U visit with dr. Aastha Dayley Limbo and Aranesp injection  All of the patient's questions were answered with apparent satisfaction. The patient knows to call the clinic with any problems, questions or concerns.  I have spent a total of 30 minutes minutes of face-to-face and non-face-to-face time, preparing to see the patient,  performing a medically appropriate examination, counseling and educating the patient, documenting clinical information in the electronic health record, and care coordination.   Dede Query PA-C Dept of Hematology and [place] at [facility] Phone: [phone]

## 2022-02-13 ENCOUNTER — Inpatient Hospital Stay: Payer: Medicare Other

## 2022-02-13 ENCOUNTER — Inpatient Hospital Stay: Payer: Medicare Other | Admitting: Hematology

## 2022-02-13 ENCOUNTER — Other Ambulatory Visit: Payer: Medicare Other

## 2022-02-14 DIAGNOSIS — R7989 Other specified abnormal findings of blood chemistry: Secondary | ICD-10-CM | POA: Diagnosis not present

## 2022-02-14 DIAGNOSIS — E039 Hypothyroidism, unspecified: Secondary | ICD-10-CM | POA: Diagnosis not present

## 2022-02-14 DIAGNOSIS — D649 Anemia, unspecified: Secondary | ICD-10-CM | POA: Diagnosis not present

## 2022-02-14 DIAGNOSIS — M810 Age-related osteoporosis without current pathological fracture: Secondary | ICD-10-CM | POA: Diagnosis not present

## 2022-02-14 DIAGNOSIS — E785 Hyperlipidemia, unspecified: Secondary | ICD-10-CM | POA: Diagnosis not present

## 2022-02-18 DIAGNOSIS — K5732 Diverticulitis of large intestine without perforation or abscess without bleeding: Secondary | ICD-10-CM | POA: Diagnosis not present

## 2022-02-18 DIAGNOSIS — Z8619 Personal history of other infectious and parasitic diseases: Secondary | ICD-10-CM | POA: Diagnosis not present

## 2022-02-18 DIAGNOSIS — D469 Myelodysplastic syndrome, unspecified: Secondary | ICD-10-CM | POA: Diagnosis not present

## 2022-02-18 DIAGNOSIS — D61818 Other pancytopenia: Secondary | ICD-10-CM | POA: Diagnosis not present

## 2022-02-27 DIAGNOSIS — E039 Hypothyroidism, unspecified: Secondary | ICD-10-CM | POA: Diagnosis not present

## 2022-02-27 DIAGNOSIS — D649 Anemia, unspecified: Secondary | ICD-10-CM | POA: Diagnosis not present

## 2022-02-27 DIAGNOSIS — D494 Neoplasm of unspecified behavior of bladder: Secondary | ICD-10-CM | POA: Diagnosis not present

## 2022-02-27 DIAGNOSIS — M810 Age-related osteoporosis without current pathological fracture: Secondary | ICD-10-CM | POA: Diagnosis not present

## 2022-02-27 DIAGNOSIS — E785 Hyperlipidemia, unspecified: Secondary | ICD-10-CM | POA: Diagnosis not present

## 2022-02-27 DIAGNOSIS — Z0000 Encounter for general adult medical examination without abnormal findings: Secondary | ICD-10-CM | POA: Diagnosis not present

## 2022-02-27 DIAGNOSIS — K219 Gastro-esophageal reflux disease without esophagitis: Secondary | ICD-10-CM | POA: Diagnosis not present

## 2022-02-27 DIAGNOSIS — D61818 Other pancytopenia: Secondary | ICD-10-CM | POA: Diagnosis not present

## 2022-02-27 DIAGNOSIS — D469 Myelodysplastic syndrome, unspecified: Secondary | ICD-10-CM | POA: Diagnosis not present

## 2022-02-27 DIAGNOSIS — G629 Polyneuropathy, unspecified: Secondary | ICD-10-CM | POA: Diagnosis not present

## 2022-02-27 DIAGNOSIS — Z1339 Encounter for screening examination for other mental health and behavioral disorders: Secondary | ICD-10-CM | POA: Diagnosis not present

## 2022-02-27 DIAGNOSIS — R82998 Other abnormal findings in urine: Secondary | ICD-10-CM | POA: Diagnosis not present

## 2022-02-27 DIAGNOSIS — Z1331 Encounter for screening for depression: Secondary | ICD-10-CM | POA: Diagnosis not present

## 2022-02-28 DIAGNOSIS — C674 Malignant neoplasm of posterior wall of bladder: Secondary | ICD-10-CM | POA: Diagnosis not present

## 2022-03-13 ENCOUNTER — Inpatient Hospital Stay: Payer: Medicare Other | Attending: Hematology

## 2022-03-13 ENCOUNTER — Other Ambulatory Visit: Payer: Self-pay | Admitting: *Deleted

## 2022-03-13 ENCOUNTER — Other Ambulatory Visit: Payer: Self-pay

## 2022-03-13 ENCOUNTER — Inpatient Hospital Stay: Payer: Medicare Other

## 2022-03-13 VITALS — BP 130/54 | HR 66 | Resp 16

## 2022-03-13 DIAGNOSIS — D46Z Other myelodysplastic syndromes: Secondary | ICD-10-CM | POA: Insufficient documentation

## 2022-03-13 DIAGNOSIS — D469 Myelodysplastic syndrome, unspecified: Secondary | ICD-10-CM

## 2022-03-13 DIAGNOSIS — D539 Nutritional anemia, unspecified: Secondary | ICD-10-CM

## 2022-03-13 LAB — CMP (CANCER CENTER ONLY)
ALT: 15 U/L (ref 0–44)
AST: 16 U/L (ref 15–41)
Albumin: 4.1 g/dL (ref 3.5–5.0)
Alkaline Phosphatase: 72 U/L (ref 38–126)
Anion gap: 5 (ref 5–15)
BUN: 25 mg/dL — ABNORMAL HIGH (ref 8–23)
CO2: 25 mmol/L (ref 22–32)
Calcium: 8.8 mg/dL — ABNORMAL LOW (ref 8.9–10.3)
Chloride: 105 mmol/L (ref 98–111)
Creatinine: 1.26 mg/dL — ABNORMAL HIGH (ref 0.44–1.00)
GFR, Estimated: 40 mL/min — ABNORMAL LOW (ref >60)
Glucose, Bld: 89 mg/dL (ref 70–99)
Potassium: 4.1 mmol/L (ref 3.5–5.1)
Sodium: 135 mmol/L (ref 135–145)
Total Bilirubin: 0.4 mg/dL (ref 0.3–1.2)
Total Protein: 7.1 g/dL (ref 6.5–8.1)

## 2022-03-13 LAB — CBC WITH DIFFERENTIAL (CANCER CENTER ONLY)
Abs Immature Granulocytes: 0.04 10*3/uL (ref 0.00–0.07)
Basophils Absolute: 0 10*3/uL (ref 0.0–0.1)
Basophils Relative: 0 %
Eosinophils Absolute: 0 10*3/uL (ref 0.0–0.5)
Eosinophils Relative: 0 %
HCT: 25.9 % — ABNORMAL LOW (ref 36.0–46.0)
Hemoglobin: 8.5 g/dL — ABNORMAL LOW (ref 12.0–15.0)
Immature Granulocytes: 2 %
Lymphocytes Relative: 38 %
Lymphs Abs: 1 10*3/uL (ref 0.7–4.0)
MCH: 33.6 pg (ref 26.0–34.0)
MCHC: 32.8 g/dL (ref 30.0–36.0)
MCV: 102.4 fL — ABNORMAL HIGH (ref 80.0–100.0)
Monocytes Absolute: 1.1 10*3/uL — ABNORMAL HIGH (ref 0.1–1.0)
Monocytes Relative: 42 %
Neutro Abs: 0.5 10*3/uL — ABNORMAL LOW (ref 1.7–7.7)
Neutrophils Relative %: 18 %
Platelet Count: 67 10*3/uL — ABNORMAL LOW (ref 150–400)
RBC: 2.53 MIL/uL — ABNORMAL LOW (ref 3.87–5.11)
RDW: 16.1 % — ABNORMAL HIGH (ref 11.5–15.5)
WBC Count: 2.7 10*3/uL — ABNORMAL LOW (ref 4.0–10.5)
nRBC: 0 % (ref 0.0–0.2)

## 2022-03-13 MED ORDER — DARBEPOETIN ALFA 150 MCG/0.3ML IJ SOSY
150.0000 ug | PREFILLED_SYRINGE | Freq: Once | INTRAMUSCULAR | Status: AC
  Administered 2022-03-13: 150 ug via SUBCUTANEOUS
  Filled 2022-03-13: qty 0.3

## 2022-04-05 ENCOUNTER — Telehealth: Payer: Self-pay | Admitting: Hematology

## 2022-04-05 ENCOUNTER — Telehealth: Payer: Self-pay | Admitting: *Deleted

## 2022-04-05 NOTE — Telephone Encounter (Signed)
Completed surgical clearance faxed to [facility].

## 2022-04-05 NOTE — Telephone Encounter (Signed)
Received fax from urology regarding patient needing TURBT for bladder tumor.  We have cleared her from a hematologic standpoint with high risk since she does not really have an option but to address the bladder tumor. #1 she is at increased risk of infections from her neutropenia ANC 500.  Would recommend aggressive perioperative antibiotic prophylaxis till she completely heals.  If there is concern for sepsis there might be a role for G-CSF to address neutropenia.  We would not use empiric G-CSF prior to that due to increased risk of blast stimulation with her MDS.  #2 increased risk of bleeding due to her thrombocytopenia.  Her platelet counts have remained about 50k and therefore are not an absolute contraindication for the procedure.  However platelet support will need to be kept handy perioperatively and she would need to be transfused as needed if actively bleeding despite surgical hemostasis and if platelets are less than 50k.  #3 anemia last hemoglobin is 8.5. Would recommend transfusing her perioperatively to maintain hemoglobin closer to 10 and if actively bleeding.  Could consult inpatient hematology for perioperative support as needed. I discussed these concerns with the patient. Recommendations have been faxed over to urology and she will be forwarding this note to her primary and urologist as well.

## 2022-04-09 ENCOUNTER — Other Ambulatory Visit: Payer: Self-pay

## 2022-04-09 DIAGNOSIS — D469 Myelodysplastic syndrome, unspecified: Secondary | ICD-10-CM

## 2022-04-10 ENCOUNTER — Inpatient Hospital Stay: Payer: Medicare Other | Attending: Hematology

## 2022-04-10 ENCOUNTER — Other Ambulatory Visit: Payer: Self-pay

## 2022-04-10 ENCOUNTER — Inpatient Hospital Stay: Payer: Medicare Other

## 2022-04-10 VITALS — BP 116/53 | HR 86 | Temp 98.0°F | Resp 18

## 2022-04-10 DIAGNOSIS — D469 Myelodysplastic syndrome, unspecified: Secondary | ICD-10-CM

## 2022-04-10 DIAGNOSIS — D46Z Other myelodysplastic syndromes: Secondary | ICD-10-CM | POA: Diagnosis not present

## 2022-04-10 DIAGNOSIS — D539 Nutritional anemia, unspecified: Secondary | ICD-10-CM

## 2022-04-10 LAB — CMP (CANCER CENTER ONLY)
ALT: 14 U/L (ref 0–44)
AST: 16 U/L (ref 15–41)
Albumin: 4.2 g/dL (ref 3.5–5.0)
Alkaline Phosphatase: 78 U/L (ref 38–126)
Anion gap: 5 (ref 5–15)
BUN: 26 mg/dL — ABNORMAL HIGH (ref 8–23)
CO2: 25 mmol/L (ref 22–32)
Calcium: 9.3 mg/dL (ref 8.9–10.3)
Chloride: 105 mmol/L (ref 98–111)
Creatinine: 1.29 mg/dL — ABNORMAL HIGH (ref 0.44–1.00)
GFR, Estimated: 39 mL/min — ABNORMAL LOW (ref >60)
Glucose, Bld: 93 mg/dL (ref 70–99)
Potassium: 4.4 mmol/L (ref 3.5–5.1)
Sodium: 135 mmol/L (ref 135–145)
Total Bilirubin: 0.4 mg/dL (ref 0.3–1.2)
Total Protein: 7.4 g/dL (ref 6.5–8.1)

## 2022-04-10 LAB — CBC WITH DIFFERENTIAL (CANCER CENTER ONLY)
Abs Immature Granulocytes: 0.03 10*3/uL (ref 0.00–0.07)
Basophils Absolute: 0 10*3/uL (ref 0.0–0.1)
Basophils Relative: 0 %
Eosinophils Absolute: 0 10*3/uL (ref 0.0–0.5)
Eosinophils Relative: 0 %
HCT: 27.1 % — ABNORMAL LOW (ref 36.0–46.0)
Hemoglobin: 8.7 g/dL — ABNORMAL LOW (ref 12.0–15.0)
Immature Granulocytes: 2 %
Lymphocytes Relative: 45 %
Lymphs Abs: 0.8 10*3/uL (ref 0.7–4.0)
MCH: 33 pg (ref 26.0–34.0)
MCHC: 32.1 g/dL (ref 30.0–36.0)
MCV: 102.7 fL — ABNORMAL HIGH (ref 80.0–100.0)
Monocytes Absolute: 0.7 10*3/uL (ref 0.1–1.0)
Monocytes Relative: 36 %
Neutro Abs: 0.3 10*3/uL — CL (ref 1.7–7.7)
Neutrophils Relative %: 17 %
Platelet Count: 64 10*3/uL — ABNORMAL LOW (ref 150–400)
RBC: 2.64 MIL/uL — ABNORMAL LOW (ref 3.87–5.11)
RDW: 16.1 % — ABNORMAL HIGH (ref 11.5–15.5)
WBC Count: 1.9 10*3/uL — ABNORMAL LOW (ref 4.0–10.5)
nRBC: 0 % (ref 0.0–0.2)

## 2022-04-10 LAB — SAMPLE TO BLOOD BANK

## 2022-04-10 MED ORDER — DARBEPOETIN ALFA 150 MCG/0.3ML IJ SOSY
150.0000 ug | PREFILLED_SYRINGE | Freq: Once | INTRAMUSCULAR | Status: AC
  Administered 2022-04-10: 150 ug via SUBCUTANEOUS
  Filled 2022-04-10: qty 0.3

## 2022-04-23 ENCOUNTER — Other Ambulatory Visit: Payer: Self-pay | Admitting: Urology

## 2022-05-01 ENCOUNTER — Encounter (HOSPITAL_COMMUNITY): Payer: Self-pay

## 2022-05-01 DIAGNOSIS — H26491 Other secondary cataract, right eye: Secondary | ICD-10-CM | POA: Diagnosis not present

## 2022-05-01 DIAGNOSIS — H52203 Unspecified astigmatism, bilateral: Secondary | ICD-10-CM | POA: Diagnosis not present

## 2022-05-01 DIAGNOSIS — Z961 Presence of intraocular lens: Secondary | ICD-10-CM | POA: Diagnosis not present

## 2022-05-01 DIAGNOSIS — H524 Presbyopia: Secondary | ICD-10-CM | POA: Diagnosis not present

## 2022-05-01 NOTE — Progress Notes (Addendum)
COVID Vaccine Completed:  Yes  Date of COVID positive in last 90 days:  PCP - Burnard Bunting, MD Cardiologist - Glenetta Hew, MD  Chest x-ray - 01-13-22 Epic EKG - 01-13-22 Epic Stress Test -  ECHO -  Cardiac Cath -  Pacemaker/ICD device last checked: Spinal Cord Stimulator: Cardiac monitor - 2019 Epic  Bowel Prep -   Sleep Study -  CPAP -   Fasting Blood Sugar -  Checks Blood Sugar _____ times a day  Blood Thinner Instructions: Aspirin Instructions: Last Dose:  Activity level:  Can go up a flight of stairs and perform activities of daily living without stopping and without symptoms of chest pain or shortness of breath.  Able to exercise without symptoms  Unable to go up a flight of stairs without symptoms of     Anesthesia review:  Palpitations and abnormal EKG evaluated by cardiology.  CKD, MDS  Patient denies shortness of breath, fever, cough and chest pain at PAT appointment  Patient verbalized understanding of instructions that were given to them at the PAT appointment. Patient was also instructed that they will need to review over the PAT instructions again at home before surgery.

## 2022-05-01 NOTE — Patient Instructions (Addendum)
SURGICAL WAITING ROOM VISITATION Patients having surgery or a procedure may have no more than 2 support people in the waiting area - these visitors may rotate.   Children under the age of 83 must have an adult with them who is not the patient. If the patient needs to stay at the hospital during part of their recovery, the visitor guidelines for inpatient rooms apply. Pre-op nurse will coordinate an appropriate time for 1 support person to accompany patient in pre-op.  This support person may not rotate.    Please refer to the [facility] website for the visitor guidelines for Inpatients (after your surgery is over and you are in a regular room).      Your procedure is scheduled on: 05-08-22   Report to [facility] Main Entrance    Report to admitting at 11:00 AM   Call this number if you have problems the morning of surgery [phone]   Do not eat food :After Midnight.   After Midnight you may have the following liquids until 10:15 AM DAY OF SURGERY  Water Non-Citrus Juices (without pulp, NO RED) Carbonated Beverages Black Coffee (NO MILK/CREAM OR CREAMERS, sugar ok)  Clear Tea (NO MILK/CREAM OR CREAMERS, sugar ok) regular and decaf                             Plain Jell-O (NO RED)                                           Fruit ices (not with fruit pulp, NO RED)                                     Popsicles (NO RED)                                                               Sports drinks like Gatorade (NO RED)                      If you have questions, please contact your surgeon's office.   FOLLOW  ANY ADDITIONAL PRE OP INSTRUCTIONS YOU RECEIVED FROM YOUR SURGEON'S OFFICE!!!     Oral Hygiene is also important to reduce your risk of infection.                                    Remember - BRUSH YOUR TEETH THE MORNING OF SURGERY WITH YOUR REGULAR TOOTHPASTE   Do NOT smoke after Midnight   Take these medicines the morning of surgery with A SIP OF WATER:    Synthroid  Pantoprazole  Sertraline                              You may not have any metal on your body including hair pins, jewelry, and body piercing             Do not wear  make-up, lotions, powders, perfumes or deodorant  Do not wear nail polish including gel and S&S, artificial/acrylic nails, or any other type of covering on natural nails including finger and toenails. If you have artificial nails, gel coating, etc. that needs to be removed by a nail salon please have this removed prior to surgery or surgery may need to be canceled/ delayed if the surgeon/ anesthesia feels like they are unable to be safely monitored.   Do not shave  48 hours prior to surgery.          Do not bring valuables to the hospital. [place].   Contacts, dentures or bridgework may not be worn into surgery.  DO NOT [place]. PHARMACY WILL DISPENSE MEDICATIONS LISTED ON YOUR MEDICATION LIST TO YOU DURING YOUR ADMISSION [place]!   Patients discharged on the day of surgery will not be allowed to drive home.  Someone NEEDS to stay with you for the first 24 hours after anesthesia.  Special Instructions: Bring a copy of your healthcare power of attorney and living will documents the day of surgery if you haven't scanned them before.  Please read over the following fact sheets you were given: IF [place]  If you received a COVID test during your pre-op visit  it is requested that you wear a mask when out in public, stay away from anyone that may not be feeling well and notify your surgeon if you develop symptoms. If you test positive for Covid or have been in contact with anyone that has tested positive in the last 10 days please notify you surgeon.  [place] - Preparing for Surgery Before surgery, you can play an important role.  Because skin is not sterile,  your skin needs to be as free of germs as possible.  You can reduce the number of germs on your skin by washing with CHG (chlorahexidine gluconate) soap before surgery.  CHG is an antiseptic cleaner which kills germs and bonds with the skin to continue killing germs even after washing. Please DO NOT use if you have an allergy to CHG or antibacterial soaps.  If your skin becomes reddened/irritated stop using the CHG and inform your nurse when you arrive at Short Stay. Do not shave (including legs and underarms) for at least 48 hours prior to the first CHG shower.  You may shave your face/neck.  Please follow these instructions carefully:  1.  Shower with CHG Soap the night before surgery and the  morning of surgery.  2.  If you choose to wash your hair, wash your hair first as usual with your normal  shampoo.  3.  After you shampoo, rinse your hair and body thoroughly to remove the shampoo.                             4.  Use CHG as you would any other liquid soap.  You can apply chg directly to the skin and wash.  Gently with a scrungie or clean washcloth.  5.  Apply the CHG Soap to your body ONLY FROM THE NECK DOWN.   Do   not use on face/ open                           Wound  or open sores. Avoid contact with eyes, ears mouth and   genitals (private parts).                       Wash face,  Genitals (private parts) with your normal soap.             6.  Wash thoroughly, paying special attention to the area where your    surgery  will be performed.  7.  Thoroughly rinse your body with warm water from the neck down.  8.  DO NOT shower/wash with your normal soap after using and rinsing off the CHG Soap.                9.  Pat yourself dry with a clean towel.            10.  Wear clean pajamas.            11.  Place clean sheets on your bed the night of your first shower and do not  sleep with pets. Day of Surgery : Do not apply any lotions/deodorants the morning of surgery.  Please wear clean  clothes to the hospital/surgery center.  FAILURE TO FOLLOW THESE INSTRUCTIONS MAY RESULT IN THE CANCELLATION OF YOUR SURGERY  PATIENT SIGNATURE_________________________________  NURSE SIGNATURE__________________________________  ________________________________________________________________________

## 2022-05-02 ENCOUNTER — Other Ambulatory Visit: Payer: Self-pay

## 2022-05-02 ENCOUNTER — Encounter (HOSPITAL_COMMUNITY): Payer: Self-pay

## 2022-05-02 ENCOUNTER — Encounter (HOSPITAL_COMMUNITY)
Admission: RE | Admit: 2022-05-02 | Discharge: 2022-05-02 | Disposition: A | Discharge status: Home / Self Care | Payer: Medicare Other | Source: Ambulatory Visit | Attending: Urology | Admitting: Urology

## 2022-05-02 VITALS — BP 138/60 | HR 78 | Temp 98.2°F | Resp 21 | Ht 68.0 in | Wt 127.8 lb

## 2022-05-02 DIAGNOSIS — D649 Anemia, unspecified: Secondary | ICD-10-CM | POA: Insufficient documentation

## 2022-05-02 DIAGNOSIS — Z01818 Encounter for other preprocedural examination: Secondary | ICD-10-CM | POA: Insufficient documentation

## 2022-05-02 HISTORY — DX: Chronic kidney disease, unspecified: N18.9

## 2022-05-02 HISTORY — DX: Unspecified osteoarthritis, unspecified site: M19.90

## 2022-05-02 HISTORY — DX: Gastro-esophageal reflux disease without esophagitis: K21.9

## 2022-05-02 HISTORY — DX: Enterocolitis due to Clostridium difficile, not specified as recurrent: A04.72

## 2022-05-02 HISTORY — DX: Anemia, unspecified: D64.9

## 2022-05-02 HISTORY — DX: Dyspnea, unspecified: R06.00

## 2022-05-02 LAB — CBC
HCT: 28.4 % — ABNORMAL LOW (ref 36.0–46.0)
Hemoglobin: 8.7 g/dL — ABNORMAL LOW (ref 12.0–15.0)
MCH: 33.2 pg (ref 26.0–34.0)
MCHC: 30.6 g/dL (ref 30.0–36.0)
MCV: 108.4 fL — ABNORMAL HIGH (ref 80.0–100.0)
Platelets: 59 10*3/uL — ABNORMAL LOW (ref 150–400)
RBC: 2.62 MIL/uL — ABNORMAL LOW (ref 3.87–5.11)
RDW: 16.8 % — ABNORMAL HIGH (ref 11.5–15.5)
WBC: 2.4 10*3/uL — ABNORMAL LOW (ref 4.0–10.5)
nRBC: 0 % (ref 0.0–0.2)

## 2022-05-08 ENCOUNTER — Ambulatory Visit (HOSPITAL_COMMUNITY)
Admission: RE | Admit: 2022-05-08 | Discharge: 2022-05-08 | Disposition: A | Discharge status: Home / Self Care | Payer: Medicare Other | Source: Ambulatory Visit | Attending: Urology | Admitting: Urology

## 2022-05-08 ENCOUNTER — Encounter (HOSPITAL_COMMUNITY): Payer: Self-pay | Admitting: Urology

## 2022-05-08 ENCOUNTER — Ambulatory Visit (HOSPITAL_COMMUNITY): Payer: Medicare Other | Admitting: Physician Assistant

## 2022-05-08 ENCOUNTER — Ambulatory Visit: Payer: Medicare Other | Admitting: Hematology

## 2022-05-08 ENCOUNTER — Encounter (HOSPITAL_COMMUNITY)
Admission: RE | Disposition: A | Discharge status: Home / Self Care | Payer: Self-pay | Source: Ambulatory Visit | Attending: Urology

## 2022-05-08 ENCOUNTER — Ambulatory Visit: Payer: Medicare Other

## 2022-05-08 ENCOUNTER — Other Ambulatory Visit: Payer: Medicare Other

## 2022-05-08 ENCOUNTER — Other Ambulatory Visit: Payer: Self-pay

## 2022-05-08 ENCOUNTER — Ambulatory Visit (HOSPITAL_BASED_OUTPATIENT_CLINIC_OR_DEPARTMENT_OTHER): Payer: Medicare Other | Admitting: Anesthesiology

## 2022-05-08 DIAGNOSIS — M1990 Unspecified osteoarthritis, unspecified site: Secondary | ICD-10-CM | POA: Diagnosis not present

## 2022-05-08 DIAGNOSIS — D469 Myelodysplastic syndrome, unspecified: Secondary | ICD-10-CM | POA: Insufficient documentation

## 2022-05-08 DIAGNOSIS — N183 Chronic kidney disease, stage 3 unspecified: Secondary | ICD-10-CM | POA: Diagnosis not present

## 2022-05-08 DIAGNOSIS — N3289 Other specified disorders of bladder: Secondary | ICD-10-CM | POA: Diagnosis not present

## 2022-05-08 DIAGNOSIS — N329 Bladder disorder, unspecified: Secondary | ICD-10-CM | POA: Diagnosis present

## 2022-05-08 DIAGNOSIS — C674 Malignant neoplasm of posterior wall of bladder: Secondary | ICD-10-CM | POA: Diagnosis not present

## 2022-05-08 DIAGNOSIS — K219 Gastro-esophageal reflux disease without esophagitis: Secondary | ICD-10-CM | POA: Insufficient documentation

## 2022-05-08 DIAGNOSIS — C679 Malignant neoplasm of bladder, unspecified: Secondary | ICD-10-CM | POA: Insufficient documentation

## 2022-05-08 DIAGNOSIS — E039 Hypothyroidism, unspecified: Secondary | ICD-10-CM

## 2022-05-08 DIAGNOSIS — D638 Anemia in other chronic diseases classified elsewhere: Secondary | ICD-10-CM | POA: Diagnosis not present

## 2022-05-08 DIAGNOSIS — Z87442 Personal history of urinary calculi: Secondary | ICD-10-CM | POA: Diagnosis not present

## 2022-05-08 DIAGNOSIS — D649 Anemia, unspecified: Secondary | ICD-10-CM

## 2022-05-08 DIAGNOSIS — F32A Depression, unspecified: Secondary | ICD-10-CM | POA: Insufficient documentation

## 2022-05-08 HISTORY — PX: TRANSURETHRAL RESECTION OF BLADDER TUMOR: SHX2575

## 2022-05-08 SURGERY — TURBT (TRANSURETHRAL RESECTION OF BLADDER TUMOR)
Anesthesia: General

## 2022-05-08 MED ORDER — PROPOFOL 10 MG/ML IV BOLUS
INTRAVENOUS | Status: AC
  Filled 2022-05-08: qty 20

## 2022-05-08 MED ORDER — LIDOCAINE HCL (PF) 2 % IJ SOLN
INTRAMUSCULAR | Status: AC
  Filled 2022-05-08: qty 5

## 2022-05-08 MED ORDER — PROPOFOL 10 MG/ML IV BOLUS
INTRAVENOUS | Status: DC | PRN
  Administered 2022-05-08 (×2): 100 mg via INTRAVENOUS

## 2022-05-08 MED ORDER — ACETAMINOPHEN 10 MG/ML IV SOLN
INTRAVENOUS | Status: AC
  Filled 2022-05-08: qty 100

## 2022-05-08 MED ORDER — PHENYLEPHRINE 80 MCG/ML (10ML) SYRINGE FOR IV PUSH (FOR BLOOD PRESSURE SUPPORT)
PREFILLED_SYRINGE | INTRAVENOUS | Status: DC | PRN
  Administered 2022-05-08 (×2): 80 ug via INTRAVENOUS

## 2022-05-08 MED ORDER — PROMETHAZINE HCL 25 MG/ML IJ SOLN: 6.2500 mg | INTRAMUSCULAR | Status: DC | PRN

## 2022-05-08 MED ORDER — AMISULPRIDE (ANTIEMETIC) 5 MG/2ML IV SOLN: 10.0000 mg | Freq: Once | INTRAVENOUS | Status: DC | PRN

## 2022-05-08 MED ORDER — OXYCODONE HCL 5 MG PO TABS: 5.0000 mg | ORAL_TABLET | Freq: Once | ORAL | Status: DC | PRN

## 2022-05-08 MED ORDER — CEFAZOLIN SODIUM-DEXTROSE 2-4 GM/100ML-% IV SOLN
2.0000 g | Freq: Once | INTRAVENOUS | Status: AC
  Administered 2022-05-08: 2 g via INTRAVENOUS
  Filled 2022-05-08: qty 100

## 2022-05-08 MED ORDER — ONDANSETRON HCL 4 MG/2ML IJ SOLN
INTRAMUSCULAR | Status: AC
  Filled 2022-05-08: qty 2

## 2022-05-08 MED ORDER — HYDROMORPHONE HCL 1 MG/ML IJ SOLN: 0.2500 mg | INTRAMUSCULAR | Status: DC | PRN

## 2022-05-08 MED ORDER — ACETAMINOPHEN 10 MG/ML IV SOLN
INTRAVENOUS | Status: DC | PRN
  Administered 2022-05-08: 1000 mg via INTRAVENOUS

## 2022-05-08 MED ORDER — LACTATED RINGERS IV SOLN: INTRAVENOUS | Status: DC

## 2022-05-08 MED ORDER — OXYBUTYNIN CHLORIDE 5 MG PO TABS: 5.0000 mg | ORAL_TABLET | Freq: Three times a day (TID) | ORAL | 1 refills | Status: DC | PRN

## 2022-05-08 MED ORDER — OXYCODONE HCL 5 MG/5ML PO SOLN: 5.0000 mg | Freq: Once | ORAL | Status: DC | PRN

## 2022-05-08 MED ORDER — CHLORHEXIDINE GLUCONATE 0.12 % MT SOLN
15.0000 mL | Freq: Once | OROMUCOSAL | Status: AC
  Administered 2022-05-08: 15 mL via OROMUCOSAL

## 2022-05-08 MED ORDER — 0.9 % SODIUM CHLORIDE (POUR BTL) OPTIME
TOPICAL | Status: DC | PRN
  Administered 2022-05-08: 1000 mL

## 2022-05-08 MED ORDER — PHENAZOPYRIDINE HCL 200 MG PO TABS: 200.0000 mg | ORAL_TABLET | Freq: Three times a day (TID) | ORAL | 0 refills | Status: DC | PRN

## 2022-05-08 MED ORDER — FENTANYL CITRATE (PF) 100 MCG/2ML IJ SOLN
INTRAMUSCULAR | Status: AC
  Filled 2022-05-08: qty 2

## 2022-05-08 MED ORDER — SODIUM CHLORIDE 0.9 % IR SOLN
Status: DC | PRN
  Administered 2022-05-08: 3000 mL

## 2022-05-08 MED ORDER — FENTANYL CITRATE (PF) 100 MCG/2ML IJ SOLN
INTRAMUSCULAR | Status: DC | PRN
  Administered 2022-05-08: 25 ug via INTRAVENOUS

## 2022-05-08 MED ORDER — ORAL CARE MOUTH RINSE: 15.0000 mL | Freq: Once | OROMUCOSAL | Status: AC

## 2022-05-08 MED ORDER — LIDOCAINE 2% (20 MG/ML) 5 ML SYRINGE
INTRAMUSCULAR | Status: DC | PRN
  Administered 2022-05-08: 60 mg via INTRAVENOUS

## 2022-05-08 MED ORDER — ONDANSETRON HCL 4 MG/2ML IJ SOLN
INTRAMUSCULAR | Status: DC | PRN
  Administered 2022-05-08: 4 mg via INTRAVENOUS

## 2022-05-08 MED ORDER — DEXAMETHASONE SODIUM PHOSPHATE 10 MG/ML IJ SOLN
INTRAMUSCULAR | Status: DC | PRN
  Administered 2022-05-08: 5 mg via INTRAVENOUS

## 2022-05-08 MED ORDER — DEXAMETHASONE SODIUM PHOSPHATE 10 MG/ML IJ SOLN
INTRAMUSCULAR | Status: AC
  Filled 2022-05-08: qty 1

## 2022-05-08 SURGICAL SUPPLY — 17 items
BAG DRN RND TRDRP ANRFLXCHMBR (UROLOGICAL SUPPLIES)
BAG URINE DRAIN 2000ML AR STRL (UROLOGICAL SUPPLIES) IMPLANT
BAG URO CATCHER STRL LF (MISCELLANEOUS) ×2 IMPLANT
DRAPE FOOT SWITCH (DRAPES) ×2 IMPLANT
ELECT REM PT RETURN 15FT ADLT (MISCELLANEOUS) ×2 IMPLANT
GLOVE SURG LX STRL 7.5 STRW (GLOVE) ×2 IMPLANT
GOWN STRL REUS W/ TWL XL LVL3 (GOWN DISPOSABLE) ×2 IMPLANT
GOWN STRL REUS W/TWL XL LVL3 (GOWN DISPOSABLE) ×1
KIT TURNOVER KIT A (KITS) IMPLANT
LOOP CUT BIPOLAR 24F LRG (ELECTROSURGICAL) IMPLANT
MANIFOLD NEPTUNE II (INSTRUMENTS) ×2 IMPLANT
PACK CYSTO (CUSTOM PROCEDURE TRAY) ×2 IMPLANT
PENCIL SMOKE EVACUATOR (MISCELLANEOUS) IMPLANT
SYR TOOMEY IRRIG 70ML (MISCELLANEOUS) ×1
SYRINGE TOOMEY IRRIG 70ML (MISCELLANEOUS) IMPLANT
TUBING CONNECTING 10 (TUBING) ×2 IMPLANT
TUBING UROLOGY SET (TUBING) ×2 IMPLANT

## 2022-05-08 NOTE — Anesthesia Preprocedure Evaluation (Signed)
Anesthesia Evaluation  Patient identified by MRN, date of birth, ID band Patient awake    Reviewed: Allergy & Precautions, NPO status , Patient's Chart, lab work & pertinent test results  Airway Mallampati: II  TM Distance: >3 FB Neck ROM: Full    Dental no notable dental hx.    Pulmonary neg pulmonary ROS,    Pulmonary exam normal breath sounds clear to auscultation       Cardiovascular negative cardio ROS Normal cardiovascular exam Rhythm:Regular Rate:Normal     Neuro/Psych Depression negative neurological ROS  negative psych ROS   GI/Hepatic Neg liver ROS, GERD  ,  Endo/Other  Hypothyroidism   Renal/GU Renal InsufficiencyRenal disease  negative genitourinary   Musculoskeletal  (+) Arthritis , Osteoarthritis,    Abdominal   Peds negative pediatric ROS (+)  Hematology  (+) Blood dyscrasia, anemia ,   Anesthesia Other Findings Bladder Cancer  Reproductive/Obstetrics negative OB ROS                             Anesthesia Physical Anesthesia Plan  ASA: 3  Anesthesia Plan: General   Post-op Pain Management: Ofirmev IV (intra-op)*   Induction: Intravenous  PONV Risk Score and Plan: 3 and Ondansetron, Dexamethasone, Midazolam and Treatment may vary due to age or medical condition  Airway Management Planned: LMA and Oral ETT  Additional Equipment:   Intra-op Plan:   Post-operative Plan: Extubation in OR  Informed Consent: I have reviewed the patients History and Physical, chart, labs and discussed the procedure including the risks, benefits and alternatives for the proposed anesthesia with the patient or authorized representative who has indicated his/her understanding and acceptance.     Dental advisory given  Plan Discussed with: CRNA  Anesthesia Plan Comments:         Anesthesia Quick Evaluation

## 2022-05-08 NOTE — Op Note (Signed)
Operative Note  Preoperative diagnosis:  1.  2.5 cm bladder mass  Postoperative diagnosis: 1.  2.5 cm bladder mass  Procedure(s): 1.  TURBT medium  Surgeon: Ellison Hughs, MD  Assistants:  None  Anesthesia:  General  Complications:  None  EBL:  <5 mL   Specimens: 1. Bladder tumor  Drains/Catheters: 1.  None  Intraoperative findings:   Indolent and superficial bladder mass involving the right posterior bladder wall  Indication:  Alison Bridges is a 88 y.o. female with a 2.5 cm papillary bladder mass with features concerning for urothelial carcinoma. PMHx of pancytopenia, myelodysplastic syndrome, macrocytic anemia, CKD3.  She has been consented for the above procedures, voices understanding and wishes to proceed.  Description of procedure:  After informed consent was obtained, the patient was brought to the operating room and general LMA anesthesia was administered. The patient was then placed in the dorsolithotomy position and prepped and draped in the usual sterile fashion. A timeout was performed. A 23 French rigid cystoscope was then inserted into the urethral meatus and advanced into the bladder under direct vision. A complete bladder survey a 2.5 cm papillary bladder tumor involving the right posterior bladder wall with no other intravesical or urethral abnormalities.  A 23 French cystoscope was then exchanged for a 26 [place] resectoscope with a bipolar loop working Actuary.  The bladder tumor was then bluntly scraped off of the bladder mucosa with little to no bleeding.  The area of mucosal involvement was then extensively fulgurated and all bladder tumor fragments were evacuated from the bladder through the sheath of the resectoscope.  The patient tolerated the procedure well and was transferred to the postanesthesia in stable condition.  Plan: Follow-up on 05/23/2022 to discuss pathology results

## 2022-05-08 NOTE — H&P (Signed)
Urology Preoperative H&P   Chief Complaint: bladder cancer   History of Present Illness: Alison Bridges is a 88 y.o. female with a history of a 2 cm posterior bladder wall tumor. The patient elected against TURBT, however, office cystoscopy in October 2021 revealed no evidence of her previously visualized bladder tumor. She was subsequently found to have mulitple small papillary tumors on surveillance cystoscopy on 09/03/21 that she elected to observe. 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 PMHx of pancytopenia, myelodysplastic syndrome, macrocytic anemia, CKD3   Past Medical History:  Diagnosis Date   Anemia    Arthritis    C. difficile diarrhea    Cancer (HCC)    Chronic kidney disease    Depression    Dyspnea    Shortness of breath with low hemoglobin   GERD (gastroesophageal reflux disease)    Hypothyroidism    Myelodysplastic disease ([employer])    Osteoporosis     Past Surgical History:  Procedure Laterality Date   ABDOMINAL HYSTERECTOMY     APPENDECTOMY  1998   CATARACT EXTRACTION W/ INTRAOCULAR LENS IMPLANT Bilateral    CHOLECYSTECTOMY  1998    Allergies: No Known Allergies  Family History  Problem Relation Age of Onset   Heart attack Mother 45   Pancreatic cancer Father    Parkinson's disease Sister    Diabetes Brother        Multiple complications   Heart disease Brother        Not sure what happened   Dementia Brother     Social History:  reports that she has never smoked. She has never used smokeless tobacco. She reports that she does not drink alcohol and does not use drugs.  ROS: A complete review of systems was performed.  All systems are negative except for pertinent findings as noted.  Physical Exam:  Vital signs in last 24 hours: Temp:  [97.8 F (36.6 C)] 97.8 F (36.6 C) (10/04 1133) Pulse Rate:  [73] 73 (10/04 1133) Resp:  [15] 15 (10/04 1133) BP: (136)/(58) 136/58 (10/04 1133) SpO2:  [95 %] 95 % (10/04  1133) Weight:  [58 kg] 58 kg (10/04 1133) Constitutional:  Alert and oriented, No acute distress Cardiovascular: Regular rate and rhythm, No JVD Respiratory: Normal respiratory effort, Lungs clear bilaterally GI: Abdomen is soft, nontender, nondistended, no abdominal masses GU: No CVA tenderness Lymphatic: No lymphadenopathy Neurologic: Grossly intact, no focal deficits Psychiatric: Normal mood and affect  Laboratory Data:  No results for input(s): "WBC", "HGB", "HCT", "PLT" in the last 72 hours.  No results for input(s): "NA", "K", "CL", "GLUCOSE", "BUN", "CALCIUM", "CREATININE" in the last 72 hours.  Invalid input(s): "CO3"   No results found for this or any previous visit (from the past 24 hour(s)). No results found for this or any previous visit (from the past 240 hour(s)).  Renal Function: No results for input(s): "CREATININE" in the last 168 hours. CrCl cannot be calculated (Patient's most recent lab result is older than the maximum 21 days allowed.).  Radiologic Imaging: No results found.  I independently reviewed the above imaging studies.  Assessment and Plan Alison Bridges is a 88 y.o. female with a 2.5 cm bladder tumor   -The risks, benefits and alternatives of cystoscopy with TURBT was discussed with the patient. The risks include, but are not limited to, bleeding, urinary tract infection, bladder perforation requiring prolonged catheterization and/or open bladder repair, ureteral obstruction, voiding dysfunction and the  inherent risks of general anesthesia. The patient voices understanding and wishes to proceed.  Ellison Hughs, MD 05/08/2022, 11:52 AM  [facility] Pager: [phone]

## 2022-05-08 NOTE — Interval H&P Note (Signed)
History and Physical Interval Note:  05/08/2022 1:18 PM  Alison Bridges  has presented today for surgery, with the diagnosis of BLADDER TUMOR.  The various methods of treatment have been discussed with the patient and family. After consideration of risks, benefits and other options for treatment, the patient has consented to  Procedure(s) with comments: TRANSURETHRAL RESECTION OF BLADDER TUMOR (TURBT) (N/A) - ONLY NEEDS 30 MIN as a surgical intervention.  The patient's history has been reviewed, patient examined, no change in status, stable for surgery.  I have reviewed the patient's chart and labs.  Questions were answered to the patient's satisfaction.     Conception Oms Brette Cast

## 2022-05-08 NOTE — Anesthesia Postprocedure Evaluation (Signed)
Anesthesia Post Note  Patient: Shaliah L Privott  Procedure(s) Performed: TRANSURETHRAL RESECTION OF BLADDER TUMOR (TURBT)     Patient location during evaluation: PACU Anesthesia Type: General Level of consciousness: awake and alert Pain management: pain level controlled Vital Signs Assessment: post-procedure vital signs reviewed and stable Respiratory status: spontaneous breathing, nonlabored ventilation and respiratory function stable Cardiovascular status: blood pressure returned to baseline and stable Postop Assessment: no apparent nausea or vomiting Anesthetic complications: no   No notable events documented.  Last Vitals:  Vitals:   05/08/22 1524 05/08/22 1527  BP: (!) 142/63 (!) 142/63  Pulse: 71 75  Resp: 14 12  Temp:  36.6 C  SpO2: 93% 99%    Last Pain:  Vitals:   05/08/22 1527  TempSrc: Oral  PainSc: 0-No pain                 Lynda Rainwater

## 2022-05-08 NOTE — Anesthesia Procedure Notes (Signed)
Procedure Name: LMA Insertion Date/Time: 05/08/2022 1:36 PM  Performed by: Sharlette Dense, CRNAPatient Re-evaluated:Patient Re-evaluated prior to induction Oxygen Delivery Method: Circle system utilized Preoxygenation: Pre-oxygenation with 100% oxygen Induction Type: IV induction LMA: LMA inserted LMA Size: 3.0 Number of attempts: 1 Placement Confirmation: positive ETCO2 and breath sounds checked- equal and bilateral Tube secured with: Tape Dental Injury: Teeth and Oropharynx as per pre-operative assessment

## 2022-05-08 NOTE — Transfer of Care (Signed)
Immediate Anesthesia Transfer of Care Note  Patient: Alison Bridges  Procedure(s) Performed: TRANSURETHRAL RESECTION OF BLADDER TUMOR (TURBT)  Patient Location: PACU  Anesthesia Type:General  Level of Consciousness: awake  Airway & Oxygen Therapy: Patient Spontanous Breathing and Patient connected to face mask oxygen  Post-op Assessment: Report given to RN and Post -op Vital signs reviewed and stable  Post vital signs: Reviewed and stable  Last Vitals:  Vitals Value Taken Time  BP 152/58 05/08/22 1420  Temp    Pulse 76 05/08/22 1420  Resp 15 05/08/22 1420  SpO2 100 % 05/08/22 1420    Last Pain:  Vitals:   05/08/22 1133  TempSrc: Oral  PainSc: 0-No pain         Complications: No notable events documented.

## 2022-05-09 ENCOUNTER — Encounter (HOSPITAL_COMMUNITY): Payer: Self-pay | Admitting: Urology

## 2022-05-09 LAB — SURGICAL PATHOLOGY

## 2022-05-16 ENCOUNTER — Other Ambulatory Visit: Payer: Self-pay | Admitting: *Deleted

## 2022-05-16 DIAGNOSIS — D469 Myelodysplastic syndrome, unspecified: Secondary | ICD-10-CM

## 2022-05-17 ENCOUNTER — Other Ambulatory Visit: Payer: Self-pay

## 2022-05-17 ENCOUNTER — Inpatient Hospital Stay (HOSPITAL_BASED_OUTPATIENT_CLINIC_OR_DEPARTMENT_OTHER): Payer: Medicare Other | Admitting: Hematology

## 2022-05-17 ENCOUNTER — Inpatient Hospital Stay: Payer: Medicare Other

## 2022-05-17 ENCOUNTER — Inpatient Hospital Stay: Payer: Medicare Other | Attending: Hematology

## 2022-05-17 VITALS — BP 125/57 | HR 94 | Temp 97.9°F | Resp 18 | Ht 68.0 in | Wt 127.9 lb

## 2022-05-17 DIAGNOSIS — D46Z Other myelodysplastic syndromes: Secondary | ICD-10-CM | POA: Diagnosis not present

## 2022-05-17 DIAGNOSIS — R197 Diarrhea, unspecified: Secondary | ICD-10-CM | POA: Insufficient documentation

## 2022-05-17 DIAGNOSIS — D469 Myelodysplastic syndrome, unspecified: Secondary | ICD-10-CM

## 2022-05-17 DIAGNOSIS — E860 Dehydration: Secondary | ICD-10-CM | POA: Insufficient documentation

## 2022-05-17 DIAGNOSIS — Z23 Encounter for immunization: Secondary | ICD-10-CM

## 2022-05-17 DIAGNOSIS — D539 Nutritional anemia, unspecified: Secondary | ICD-10-CM

## 2022-05-17 LAB — CMP (CANCER CENTER ONLY)
ALT: 16 U/L (ref 0–44)
AST: 17 U/L (ref 15–41)
Albumin: 4.1 g/dL (ref 3.5–5.0)
Alkaline Phosphatase: 81 U/L (ref 38–126)
Anion gap: 6 (ref 5–15)
BUN: 24 mg/dL — ABNORMAL HIGH (ref 8–23)
CO2: 24 mmol/L (ref 22–32)
Calcium: 8.9 mg/dL (ref 8.9–10.3)
Chloride: 105 mmol/L (ref 98–111)
Creatinine: 1.36 mg/dL — ABNORMAL HIGH (ref 0.44–1.00)
GFR, Estimated: 37 mL/min — ABNORMAL LOW (ref >60)
Glucose, Bld: 96 mg/dL (ref 70–99)
Potassium: 4.3 mmol/L (ref 3.5–5.1)
Sodium: 135 mmol/L (ref 135–145)
Total Bilirubin: 0.4 mg/dL (ref 0.3–1.2)
Total Protein: 7.3 g/dL (ref 6.5–8.1)

## 2022-05-17 LAB — CBC WITH DIFFERENTIAL (CANCER CENTER ONLY)
Abs Immature Granulocytes: 0.04 10*3/uL (ref 0.00–0.07)
Basophils Absolute: 0 10*3/uL (ref 0.0–0.1)
Basophils Relative: 0 %
Eosinophils Absolute: 0 10*3/uL (ref 0.0–0.5)
Eosinophils Relative: 0 %
HCT: 25.3 % — ABNORMAL LOW (ref 36.0–46.0)
Hemoglobin: 8.1 g/dL — ABNORMAL LOW (ref 12.0–15.0)
Immature Granulocytes: 2 %
Lymphocytes Relative: 37 %
Lymphs Abs: 0.7 10*3/uL (ref 0.7–4.0)
MCH: 32.9 pg (ref 26.0–34.0)
MCHC: 32 g/dL (ref 30.0–36.0)
MCV: 102.8 fL — ABNORMAL HIGH (ref 80.0–100.0)
Monocytes Absolute: 0.7 10*3/uL (ref 0.1–1.0)
Monocytes Relative: 40 %
Neutro Abs: 0.4 10*3/uL — CL (ref 1.7–7.7)
Neutrophils Relative %: 21 %
Platelet Count: 61 10*3/uL — ABNORMAL LOW (ref 150–400)
RBC: 2.46 MIL/uL — ABNORMAL LOW (ref 3.87–5.11)
RDW: 15.8 % — ABNORMAL HIGH (ref 11.5–15.5)
WBC Count: 1.8 10*3/uL — ABNORMAL LOW (ref 4.0–10.5)
nRBC: 0 % (ref 0.0–0.2)

## 2022-05-17 MED ORDER — CEFPODOXIME PROXETIL 100 MG PO TABS: 100.0000 mg | ORAL_TABLET | Freq: Two times a day (BID) | ORAL | 0 refills | Status: AC

## 2022-05-17 MED ORDER — DARBEPOETIN ALFA 150 MCG/0.3ML IJ SOSY
300.0000 ug | PREFILLED_SYRINGE | Freq: Once | INTRAMUSCULAR | Status: AC
  Administered 2022-05-17: 300 ug via SUBCUTANEOUS
  Filled 2022-05-17: qty 0.6

## 2022-05-17 MED ORDER — INFLUENZA VAC A&B SA ADJ QUAD 0.5 ML IM PRSY
0.5000 mL | PREFILLED_SYRINGE | Freq: Once | INTRAMUSCULAR | Status: AC
  Administered 2022-05-17: 0.5 mL via INTRAMUSCULAR
  Filled 2022-05-17: qty 0.5

## 2022-05-17 MED ORDER — INFLUENZA VAC A&B SA ADJ QUAD 0.5 ML IM PRSY: 0.5000 mL | PREFILLED_SYRINGE | Freq: Once | INTRAMUSCULAR | Status: DC

## 2022-05-17 NOTE — Progress Notes (Signed)
Ok to give flu vaccine w/ low ANC per Dr. Carlynn Herald, PharmD

## 2022-05-20 LAB — SAMPLE TO BLOOD BANK

## 2022-05-22 ENCOUNTER — Other Ambulatory Visit: Payer: Self-pay

## 2022-05-22 DIAGNOSIS — D469 Myelodysplastic syndrome, unspecified: Secondary | ICD-10-CM

## 2022-05-22 NOTE — Telephone Encounter (Signed)
Pt called reporting diarrhea and PCP thought it may be related to abx Dr Irene Limbo prescribed for pt. Pt states multiple loose stools after any type of food. Pt is taking probiotic Per Dr Irene Limbo. Pt directed to stop Vantin and  we will recheck her next week. Pt to call if her diarrhea does not get better. Pt verbalized understanding.

## 2022-05-24 ENCOUNTER — Encounter: Payer: Self-pay | Admitting: Hematology

## 2022-05-24 ENCOUNTER — Inpatient Hospital Stay: Payer: Medicare Other

## 2022-05-24 ENCOUNTER — Inpatient Hospital Stay (HOSPITAL_BASED_OUTPATIENT_CLINIC_OR_DEPARTMENT_OTHER): Payer: Medicare Other | Admitting: Physician Assistant

## 2022-05-24 VITALS — BP 146/59 | HR 84 | Temp 97.9°F | Resp 16 | Wt 125.1 lb

## 2022-05-24 DIAGNOSIS — D61818 Other pancytopenia: Secondary | ICD-10-CM

## 2022-05-24 DIAGNOSIS — D469 Myelodysplastic syndrome, unspecified: Secondary | ICD-10-CM | POA: Diagnosis not present

## 2022-05-24 DIAGNOSIS — Z23 Encounter for immunization: Secondary | ICD-10-CM | POA: Diagnosis not present

## 2022-05-24 DIAGNOSIS — D46Z Other myelodysplastic syndromes: Secondary | ICD-10-CM | POA: Diagnosis not present

## 2022-05-24 DIAGNOSIS — R197 Diarrhea, unspecified: Secondary | ICD-10-CM | POA: Diagnosis not present

## 2022-05-24 DIAGNOSIS — E860 Dehydration: Secondary | ICD-10-CM | POA: Diagnosis not present

## 2022-05-24 LAB — CBC WITH DIFFERENTIAL (CANCER CENTER ONLY)
Abs Immature Granulocytes: 0.02 10*3/uL (ref 0.00–0.07)
Basophils Absolute: 0 10*3/uL (ref 0.0–0.1)
Basophils Relative: 0 %
Eosinophils Absolute: 0 10*3/uL (ref 0.0–0.5)
Eosinophils Relative: 0 %
HCT: 25.4 % — ABNORMAL LOW (ref 36.0–46.0)
Hemoglobin: 8.3 g/dL — ABNORMAL LOW (ref 12.0–15.0)
Immature Granulocytes: 1 %
Lymphocytes Relative: 30 %
Lymphs Abs: 0.8 10*3/uL (ref 0.7–4.0)
MCH: 32.9 pg (ref 26.0–34.0)
MCHC: 32.7 g/dL (ref 30.0–36.0)
MCV: 100.8 fL — ABNORMAL HIGH (ref 80.0–100.0)
Monocytes Absolute: 0.9 10*3/uL (ref 0.1–1.0)
Monocytes Relative: 37 %
Neutro Abs: 0.8 10*3/uL — ABNORMAL LOW (ref 1.7–7.7)
Neutrophils Relative %: 32 %
Platelet Count: 52 10*3/uL — ABNORMAL LOW (ref 150–400)
RBC: 2.52 MIL/uL — ABNORMAL LOW (ref 3.87–5.11)
RDW: 16.3 % — ABNORMAL HIGH (ref 11.5–15.5)
WBC Count: 2.5 10*3/uL — ABNORMAL LOW (ref 4.0–10.5)
nRBC: 0 % (ref 0.0–0.2)

## 2022-05-24 LAB — SAMPLE TO BLOOD BANK

## 2022-05-24 LAB — CMP (CANCER CENTER ONLY)
ALT: 16 U/L (ref 0–44)
AST: 17 U/L (ref 15–41)
Albumin: 4.3 g/dL (ref 3.5–5.0)
Alkaline Phosphatase: 79 U/L (ref 38–126)
Anion gap: 7 (ref 5–15)
BUN: 27 mg/dL — ABNORMAL HIGH (ref 8–23)
CO2: 21 mmol/L — ABNORMAL LOW (ref 22–32)
Calcium: 9.2 mg/dL (ref 8.9–10.3)
Chloride: 106 mmol/L (ref 98–111)
Creatinine: 1.51 mg/dL — ABNORMAL HIGH (ref 0.44–1.00)
GFR, Estimated: 32 mL/min — ABNORMAL LOW (ref >60)
Glucose, Bld: 115 mg/dL — ABNORMAL HIGH (ref 70–99)
Potassium: 3.7 mmol/L (ref 3.5–5.1)
Sodium: 134 mmol/L — ABNORMAL LOW (ref 135–145)
Total Bilirubin: 0.5 mg/dL (ref 0.3–1.2)
Total Protein: 7.6 g/dL (ref 6.5–8.1)

## 2022-05-24 MED ORDER — DIFICID 200 MG PO TABS: 200.0000 mg | ORAL_TABLET | Freq: Two times a day (BID) | ORAL | 0 refills | Status: AC

## 2022-05-24 MED ORDER — SODIUM CHLORIDE 0.9 % IV SOLN: Freq: Once | INTRAVENOUS | Status: AC

## 2022-05-24 NOTE — Progress Notes (Signed)
Symptom Management Consult note [place]    Patient Care Team: Burnard Bunting, MD as PCP - General (Internal Medicine) [name], MD as Consulting Physician (Urology)    Name of the patient: Alison Bridges  [id number]  [birth year]   Date of visit: 05/24/2022   Chief Complaint/Reason for visit: lab check   Current Therapy: Aranesp  Last treatment:  05/17/22   ASSESSMENT & PLAN: Patient is a 88 y.o. 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 Irene Limbo.  I have viewed most recent oncology note and lab work.    #) MDS/MPN with SRS F2 mutation  - Per most recent oncology note plan is to RTC in 2 months.  #) Pancytopenia -WBC today is 2.5, ANC 0.8, platelet 52 and HgB 8.3 This is improvement compared to labs x1 week ago.   -No indication for blood or platelet transfusion -Patient denies any recent fever and is febrile here in clinic. -Neutropenic precautions were discussed  #)Dehydration -Patient has had limited p.o. intake secondary to diarrhea. -She was given a liter of IV fluids here in clinic. -CMP shows mild AKI with BUN/creatinine 27/1.51 compared to 1 week ago and it was 24/1.36.  Baseline creatinine appears to be around 1.2  #) Diarrhea -Patient is on.  She is hemodynamically stable.  She does appear clinically dehydrated.  Abdomen is nontender on exam. -Patient already has C. difficile testing in process with PCP office.  This was confirmed by PCP MA.  We will collect stool and send for GI PCR panel checking for viral illness. 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  Last had it 2 months ago and was treated with Dificid with resolution. -Patient's diarrhea could be caused by recent antibiotic use Vantin that was prescribed for neutropenia. -Had lengthy discussion with patient regarding the risk of taking Dificid without known C. difficile test result.  As she has current test in process she  should not take antidiarrheal in case it ends up being positive.  Patient is requesting prescription for Dificid at this time.  Risks and benefits were discussed that taking medicine with out known test result and that this could cause diarrhea to worsen if testing is truly negative.  Patient is of sound mind and able to make her own medical decisions.  Prescription will be sent to her pharmacy for Dificid. -Per PCP MA C. difficile testing will result early next week.  I will have Dr. Cloyde Reams RN follow-up on this result.  If result is negative we will call the patient telling her to discontinue Dificid.      Heme/Onc History: Oncology History   No history exists.      Interval history-: Alison Bridges is a 88 y.o. female with pertinent history of MDS/MPN with SRS F2 mutation presenting to [facility] today with chief complaint of diarrhea and lab check.  Patient presents alone to clinic. Patient states she has had diarrhea for the last x6 days.  She was previously taking Vantin prophylactically for neutropenia with concern for possible UTI.  She states she stopped the [place] x2 days ago.  Patient has had 3 episodes of diarrhea in the last 24 hours.  She describes the stool as watery and brown in color.  Patient admits stool has a terrible odor that is different than her typical bowel movements.  Patient reports with any food intake she will have diarrhea within 30 minutes and been purposefully  restricting intake to avoid having bowel movements. Patient is confident her stool is the same as when she had C. difficile in August.  She denies any urinary symptoms. Patient denies any fever, chills, abdominal pain, nausea, vomiting.    ROS  All other systems are reviewed and are negative for acute change except as noted in the HPI.    No Known Allergies   Past Medical History:  Diagnosis Date   Anemia    Arthritis    C. difficile diarrhea    Cancer (HCC)    Chronic kidney disease    Depression     Dyspnea    Shortness of breath with low hemoglobin   GERD (gastroesophageal reflux disease)    Hypothyroidism    Myelodysplastic disease ([place])    Osteoporosis      Past Surgical History:  Procedure Laterality Date   ABDOMINAL HYSTERECTOMY     APPENDECTOMY  1998   CATARACT EXTRACTION W/ INTRAOCULAR LENS IMPLANT Bilateral    CHOLECYSTECTOMY  1998   TRANSURETHRAL RESECTION OF BLADDER TUMOR N/A 05/08/2022   Procedure: TRANSURETHRAL RESECTION OF BLADDER TUMOR (TURBT);  Surgeon: [name], MD;  Location: [facility];  Service: Urology;  Laterality: N/A;  ONLY NEEDS 30 MIN    Social History   Socioeconomic History   Marital status: Widowed    Spouse name: Not on file   Number of children: Not on file   Years of education: Not on file   Highest education level: Not on file  Occupational History   Occupation: Oncologist    Comment: Retired  Tobacco Use   Smoking status: Never   Smokeless tobacco: Never  Vaping Use   Vaping Use: Never used  Substance and Sexual Activity   Alcohol use: No   Drug use: No   Sexual activity: Not on file  Other Topics Concern   Not on file  Social History Narrative   She is tried to stay active --  2 days a week she does senior aerobics.  She also enjoys going for walks, but no standard routine.      She has 3 children and 2 grandchildren.   Social Determinants of Health   Financial Resource Strain: Not on file  Food Insecurity: No Food Insecurity (02/29/2020)   Hunger Vital Sign    Worried About Running Out of Food in the Last Year: Never true    Ran Out of Food in the Last Year: Never true  Transportation Needs: No Transportation Needs (02/29/2020)   PRAPARE - Hydrologist (Medical): No    Lack of Transportation (Non-Medical): No  Physical Activity: Not on file  Stress: Not on file  Social Connections: Not on file  Intimate Partner Violence: Not on file    Family History  Problem  Relation Age of Onset   Heart attack Mother 25   Pancreatic cancer Father    Parkinson's disease Sister    Diabetes Brother        Multiple complications   Heart disease Brother        Not sure what happened   Dementia Brother      Current Outpatient Medications:    fidaxomicin (DIFICID) 200 MG TABS tablet, Take 1 tablet (200 mg total) by mouth 2 (two) times daily for 10 days., Disp: 20 tablet, Rfl: 0   Ascorbic Acid (VITAMIN C PO), Take 1 tablet by mouth daily., Disp: , Rfl:    Azelastine HCl 137 MCG/SPRAY SOLN,  Place 1 spray into both nostrils daily as needed (allergies)., Disp: , Rfl:    b complex vitamins capsule, Take 1 capsule by mouth daily., Disp: , Rfl:    cefpodoxime (VANTIN) 100 MG tablet, Take 1 tablet (100 mg total) by mouth 2 (two) times daily for 14 days. Consider taking over the counter probiotics to reduce risk of antibiotic related diarrhea., Disp: 28 tablet, Rfl: 0   cholecalciferol (VITAMIN D) 1000 UNITS tablet, Take 1,000 Units by mouth daily., Disp: , Rfl:    Darbepoetin Alfa (ARANESP, ALBUMIN FREE,) 150 MCG/0.3ML SOSY injection, Inject 150 mcg into the skin every 30 (thirty) days. 12-19-21 last dose given, Disp: , Rfl:    levothyroxine (SYNTHROID, LEVOTHROID) 125 MCG tablet, Take 125 mcg by mouth daily before breakfast., Disp: , Rfl:    OVER THE COUNTER MEDICATION, Take 1 capsule by mouth daily. Blood Builder, Disp: , Rfl:    oxybutynin (DITROPAN) 5 MG tablet, Take 1 tablet (5 mg total) by mouth every 8 (eight) hours as needed for bladder spasms. (Patient not taking: Reported on 05/17/2022), Disp: 30 tablet, Rfl: 1   pantoprazole (PROTONIX) 40 MG tablet, Take 40 mg by mouth daily as needed (acid reflux)., Disp: , Rfl:    phenazopyridine (PYRIDIUM) 200 MG tablet, Take 1 tablet (200 mg total) by mouth 3 (three) times daily as needed (for pain with urination). (Patient not taking: Reported on 05/17/2022), Disp: 30 tablet, Rfl: 0   sertraline (ZOLOFT) 100 MG tablet, Take  100 mg by mouth daily., Disp: , Rfl:   PHYSICAL EXAM: ECOG FS:1 - Symptomatic but completely ambulatory    Vitals:   05/24/22 1337 05/24/22 1606  BP: (!) 153/64 (!) 146/59  Pulse: 95 84  Resp: 16 16  Temp: 97.9 F (36.6 C)   TempSrc: Oral   SpO2: 98% 97%  Weight: 125 lb 1.6 oz (56.7 kg)    Physical Exam Vitals and nursing note reviewed.  Constitutional:      Appearance: She is well-developed. She is not ill-appearing or toxic-appearing.  HENT:     Head: Normocephalic.     Nose: Nose normal.     Mouth/Throat:     Mouth: Mucous membranes are dry.  Eyes:     Conjunctiva/sclera: Conjunctivae normal.  Neck:     Vascular: No JVD.  Cardiovascular:     Rate and Rhythm: Normal rate and regular rhythm.     Pulses: Normal pulses.     Heart sounds: Normal heart sounds.  Pulmonary:     Effort: Pulmonary effort is normal.     Breath sounds: Normal breath sounds.  Abdominal:     General: Bowel sounds are normal. There is distension (mild distension which is baseline per patient).     Palpations: Abdomen is soft. There is no mass.     Tenderness: There is no abdominal tenderness. There is no guarding or rebound.     Hernia: No hernia is present.  Musculoskeletal:     Cervical back: Normal range of motion.     Right lower leg: No edema.     Left lower leg: No edema.  Skin:    General: Skin is warm and dry.  Neurological:     Mental Status: She is oriented to person, place, and time.        LABORATORY DATA: I have reviewed the data as listed    Latest Ref Rng & Units 05/24/2022    1:49 PM 05/17/2022    9:15 AM 05/02/2022  11:30 AM  CBC  WBC 4.0 - 10.5 K/uL 2.5  1.8  2.4   Hemoglobin 12.0 - 15.0 g/dL 8.3  8.1  8.7   Hematocrit 36.0 - 46.0 % 25.4  25.3  28.4   Platelets 150 - 400 K/uL 52  61  59         Latest Ref Rng & Units 05/24/2022    1:49 PM 05/17/2022    9:15 AM 04/10/2022    9:20 AM  CMP  Glucose 70 - 99 mg/dL 115  96  93   BUN 8 - 23 mg/dL '27  24  26    '$ Creatinine 0.44 - 1.00 mg/dL 1.51  1.36  1.29   Sodium 135 - 145 mmol/L 134  135  135   Potassium 3.5 - 5.1 mmol/L 3.7  4.3  4.4   Chloride 98 - 111 mmol/L 106  105  105   CO2 22 - 32 mmol/L '21  24  25   '$ Calcium 8.9 - 10.3 mg/dL 9.2  8.9  9.3   Total Protein 6.5 - 8.1 g/dL 7.6  7.3  7.4   Total Bilirubin 0.3 - 1.2 mg/dL 0.5  0.4  0.4   Alkaline Phos 38 - 126 U/L 79  81  78   AST 15 - 41 U/L '17  17  16   '$ ALT 0 - 44 U/L '16  16  14        '$ RADIOGRAPHIC STUDIES (from last 24 hours if applicable) I have personally reviewed the radiological images as listed and agreed with the findings in the report. No results found.      Visit Diagnosis: 1. Diarrhea, unspecified type   2. Pancytopenia ([place])   3. MDS (myelodysplastic syndrome) ([place])      Orders Placed This Encounter  Procedures   Gastrointestinal Panel by PCR , Stool    All questions were answered. The patient knows to call the clinic with any problems, questions or concerns. No barriers to learning was detected.  I have spent a total of 30 minutes minutes of face-to-face and non-face-to-face time, preparing to see the patient, obtaining and/or reviewing separately obtained history, performing a medically appropriate examination, counseling and educating the patient, ordering tests, documenting clinical information in the electronic health record, and care coordination (communications with other health care professionals or caregivers).    Thank you for allowing me to participate in the care of this patient.    Barrie Folk, PA-C Department of Hematology/Oncology [facility] at [facility] Phone: [phone]  [phone]    05/24/2022 4:22 PM

## 2022-05-24 NOTE — Progress Notes (Signed)
HEMATOLOGY/ONCOLOGY CLINIC NOTE  Date of Service: .05/17/2022   Patient Care Team: Burnard Bunting, MD as PCP - General (Internal Medicine) Alison Neighbours Conception Oms, MD as Consulting Physician (Urology)  REFERRING PHYSICIAN: Burnard Bunting, MD  CHIEF COMPLAINTS/PURPOSE OF CONSULTATION:  Follow-up for continued management of pancytopenia from MDS  HISTORY OF PRESENTING ILLNESS: Please see previous notes for details on initial presentation  INTERVAL HISTORY:   Alison Bridges is a wonderful 88 y.o. female who is here for continued evaluation and management of her MDS/MPN with SRS F2 mutation who has been on supportive care with ESA's and transfusions as needed.  She recently had a transurethral resection of a bladder tumor on 05/08/2022 which showed noninvasive low-grade papillary urothelial carcinoma which is being managed by Dr. Gilford Rile from urology.  She notes some hematuria after the procedure.  Not currently on any antibiotics for prophylaxis per urology.  Currently having no active hematuria.  No fevers no chills no night sweats.  Her Aranesp dose was delayed by about a week or 2.  Labs done today show some worsening anemia with hemoglobin of 8.1 in the context of blood loss from her TURBT and some delay in her iron as injection. Continues to remain neutropenic with an ANC of 0.4k and is agreeable to doing prophylactic antibiotics to prevent urinary tract infection for the next 1 to 2 weeks. Platelets are stable at 61k   MEDICAL HISTORY:  Past Medical History:  Diagnosis Date   Anemia    Arthritis    C. difficile diarrhea    Cancer (HCC)    Chronic kidney disease    Depression    Dyspnea    Shortness of breath with low hemoglobin   GERD (gastroesophageal reflux disease)    Hypothyroidism    Myelodysplastic disease ([place])    Osteoporosis    SURGICAL HISTORY: Past Surgical History:  Procedure Laterality Date   ABDOMINAL HYSTERECTOMY     APPENDECTOMY  1998    CATARACT EXTRACTION W/ INTRAOCULAR LENS IMPLANT Bilateral    CHOLECYSTECTOMY  1998   TRANSURETHRAL RESECTION OF BLADDER TUMOR N/A 05/08/2022   Procedure: TRANSURETHRAL RESECTION OF BLADDER TUMOR (TURBT);  Surgeon: [name], MD;  Location: [facility];  Service: Urology;  Laterality: N/A;  ONLY NEEDS 30 MIN   SOCIAL HISTORY: Social History   Socioeconomic History   Marital status: Widowed    Spouse name: Not on file   Number of children: Not on file   Years of education: Not on file   Highest education level: Not on file  Occupational History   Occupation: Oncologist    Comment: Retired  Tobacco Use   Smoking status: Never   Smokeless tobacco: Never  Vaping Use   Vaping Use: Never used  Substance and Sexual Activity   Alcohol use: No   Drug use: No   Sexual activity: Not on file  Other Topics Concern   Not on file  Social History Narrative   She is tried to stay active --  2 days a week she does senior aerobics.  She also enjoys going for walks, but no standard routine.      She has 3 children and 2 grandchildren.   Social Determinants of Health   Financial Resource Strain: Not on file  Food Insecurity: No Food Insecurity (02/29/2020)   Hunger Vital Sign    Worried About Running Out of Food in the Last Year: Never true    Ran Out of Food in the  Last Year: Never true  Transportation Needs: No Transportation Needs (02/29/2020)   PRAPARE - Hydrologist (Medical): No    Lack of Transportation (Non-Medical): No  Physical Activity: Not on file  Stress: Not on file  Social Connections: Not on file  Intimate Partner Violence: Not on file     FAMILY HISTORY: Family History  Problem Relation Age of Onset   Heart attack Mother 76   Pancreatic cancer Father    Parkinson's disease Sister    Diabetes Brother        Multiple complications   Heart disease Brother        Not sure what happened   Dementia Brother       ALLERGIES:   has No Known Allergies.   MEDICATIONS:  Current Outpatient Medications  Medication Sig Dispense Refill   Ascorbic Acid (VITAMIN C PO) Take 1 tablet by mouth daily.     Azelastine HCl 137 MCG/SPRAY SOLN Place 1 spray into both nostrils daily as needed (allergies).     b complex vitamins capsule Take 1 capsule by mouth daily.     cefpodoxime (VANTIN) 100 MG tablet Take 1 tablet (100 mg total) by mouth 2 (two) times daily for 14 days. Consider taking over the counter probiotics to reduce risk of antibiotic related diarrhea. 28 tablet 0   cholecalciferol (VITAMIN D) 1000 UNITS tablet Take 1,000 Units by mouth daily.     Darbepoetin Alfa (ARANESP, ALBUMIN FREE,) 150 MCG/0.3ML SOSY injection Inject 150 mcg into the skin every 30 (thirty) days. 12-19-21 last dose given     levothyroxine (SYNTHROID, LEVOTHROID) 125 MCG tablet Take 125 mcg by mouth daily before breakfast.     OVER THE COUNTER MEDICATION Take 1 capsule by mouth daily. Blood Builder     pantoprazole (PROTONIX) 40 MG tablet Take 40 mg by mouth daily as needed (acid reflux).     sertraline (ZOLOFT) 100 MG tablet Take 100 mg by mouth daily.     oxybutynin (DITROPAN) 5 MG tablet Take 1 tablet (5 mg total) by mouth every 8 (eight) hours as needed for bladder spasms. (Patient not taking: Reported on 05/17/2022) 30 tablet 1   phenazopyridine (PYRIDIUM) 200 MG tablet Take 1 tablet (200 mg total) by mouth 3 (three) times daily as needed (for pain with urination). (Patient not taking: Reported on 05/17/2022) 30 tablet 0   No current facility-administered medications for this visit.     REVIEW OF SYSTEMS:   10 Point review of Systems was done is negative except as noted above.    PHYSICAL EXAMINATION: ECOG PERFORMANCE STATUS: 2 - Symptomatic, <50% confined to bed  Vitals:   05/17/22 0955  BP: (!) 125/57  Pulse: 94  Resp: 18  Temp: 97.9 F (36.6 C)  SpO2: 97%   Filed Weights   05/17/22 0955  Weight: 127 lb  14.4 oz (58 kg)   Body mass index is 19.45 kg/m. NAD GENERAL:alert, in no acute distress and comfortable SKIN: no acute rashes, no significant lesions EYES: conjunctiva are pink and non-injected, sclera anicteric OROPHARYNX: MMM, no exudates, no oropharyngeal erythema or ulceration NECK: supple, no JVD LYMPH:  no palpable lymphadenopathy in the cervical, axillary or inguinal regions LUNGS: clear to auscultation b/l with normal respiratory effort HEART: regular rate & rhythm ABDOMEN:  normoactive bowel sounds , non tender, not distended. Extremity: no pedal edema PSYCH: alert & oriented x 3 with fluent speech NEURO: no focal motor/sensory deficits  LABORATORY DATA:  I have reviewed the data as listed     Latest Ref Rng & Units 05/17/2022    9:15 AM 05/02/2022   11:30 AM 04/10/2022    9:20 AM  CBC  WBC 4.0 - 10.5 K/uL 1.8  2.4  1.9   Hemoglobin 12.0 - 15.0 g/dL 8.1  8.7  8.7   Hematocrit 36.0 - 46.0 % 25.3  28.4  27.1   Platelets 150 - 400 K/uL 61  59  64   ANC 700     Latest Ref Rng & Units 05/17/2022    9:15 AM 04/10/2022    9:20 AM 03/13/2022    9:27 AM  CMP  Glucose 70 - 99 mg/dL 96  93  89   BUN 8 - 23 mg/dL '24  26  25   '$ Creatinine 0.44 - 1.00 mg/dL 1.36  1.29  1.26   Sodium 135 - 145 mmol/L 135  135  135   Potassium 3.5 - 5.1 mmol/L 4.3  4.4  4.1   Chloride 98 - 111 mmol/L 105  105  105   CO2 22 - 32 mmol/L '24  25  25   '$ Calcium 8.9 - 10.3 mg/dL 8.9  9.3  8.8   Total Protein 6.5 - 8.1 g/dL 7.3  7.4  7.1   Total Bilirubin 0.3 - 1.2 mg/dL 0.4  0.4  0.4   Alkaline Phos 38 - 126 U/L 81  78  72   AST 15 - 41 U/L '17  16  16   '$ ALT 0 - 44 U/L '16  14  15    '$ . Lab Results  Component Value Date   IRON 27 (L) 01/13/2022   TIBC 222 (L) 01/13/2022   IRONPCTSAT 12 01/13/2022   (Iron and TIBC)  Lab Results  Component Value Date   FERRITIN 558 (H) 01/13/2022     01/10/2020 Cytogenetics [phone]):    01/10/2020 FISH Panel ([id number]):   01/10/2020 BM Bx Report  ([id number]):    12/16/2019 JAK2, MPL, CALR Panel Report:    RADIOGRAPHIC STUDIES: I have personally reviewed the radiological images as listed and agreed with the findings in the report. No results found.   ASSESSMENT & PLAN:   SHELTON SOLER is a 88 y.o. female with:   Pancytopenia - likely from MDS +/- MPN -01/10/2020 BM Bx Surgical Pathology Report ([id number]) revealed "BONE MARROW, ASPIRATE, CLOT, CORE: -Hypercellular marrow with myeloid hyperplasia and megakaryocytic atypia  PERIPHERAL BLOOD: -  Pancytopenia". -12/16/19 JAK2 (including V617 and Exon 12), MPL, and CALR-Next Generation Sequencing shows "Tier II: Variants of Potential Clinical Significance - SRSF2 p.Pro95His"  2.  Hepatosplenomegaly likely from her MDS/MPN noted on CT abdomen pelvis January 2022  3.  TURBT on 05/08/2022 showing noninvasive low-grade papillary urothelial carcinoma managed by urology Dr. Lovena Neighbours.  PLAN: -Labs done today were reviewed in detail with the patient. -She has been running low her hemoglobin levels and notes fatigue and her hemoglobin is a little lower at 8.1 in the context of bleeding from her recent TURBT as well as a slight delay in her Aranesp dose due to her surgery. -Currently notes no hematuria.  No fevers no chills no night sweats.  No other overt bleeding. -Given her significant neutropenia we discussed placing her on prophylactic antibiotics to cover possible urinary infection for the next 1 to 2 weeks with Vantin 100 mg p.o. twice daily with probiotics. -We shall increase her Aranesp dose to 300 mcg every 4 weeks. -No indication for  platelet transfusion at this time. -Avoid NSAIDs or other medications that could worsen thrombocytopenia Continue vitamin B complex No indication for G-CSF at this time unless the patient has significant infection in the context of neutropenia -Might need to consider prophylactic antivirals and antibacterials and antifungals if any signs of  recurrent infections.  FOLLOW UP: Labs and 1 unit of PRBC if needed and NP visit with Murray Hodgkins in 2 weeks Please schedule next 4 months of monthly Aranesp with labs Return to clinic with Dr. Irene Limbo in 2 months  The total time spent in the appointment was 32 minutes*.  All of the patient's questions were answered with apparent satisfaction. The patient knows to call the clinic with any problems, questions or concerns.   Sullivan Lone MD MS AAHIVMS [facility] Hematology/Oncology Physician [facility]  .*Total Encounter Time as defined by the Centers for Medicare and Medicaid Services includes, in addition to the face-to-face time of a patient visit (documented in the note above) non-face-to-face time: obtaining and reviewing outside history, ordering and reviewing medications, tests or procedures, care coordination (communications with other health care professionals or caregivers) and documentation in the medical record.

## 2022-05-25 LAB — GASTROINTESTINAL PANEL BY PCR, STOOL (REPLACES STOOL CULTURE)

## 2022-05-28 ENCOUNTER — Other Ambulatory Visit: Payer: Medicare Other

## 2022-05-28 ENCOUNTER — Encounter: Payer: Medicare Other | Admitting: Physician Assistant

## 2022-06-03 ENCOUNTER — Other Ambulatory Visit: Payer: Self-pay | Admitting: Physician Assistant

## 2022-06-03 DIAGNOSIS — D469 Myelodysplastic syndrome, unspecified: Secondary | ICD-10-CM

## 2022-06-04 ENCOUNTER — Inpatient Hospital Stay (HOSPITAL_BASED_OUTPATIENT_CLINIC_OR_DEPARTMENT_OTHER): Payer: Medicare Other | Admitting: Physician Assistant

## 2022-06-04 ENCOUNTER — Other Ambulatory Visit: Payer: Self-pay

## 2022-06-04 ENCOUNTER — Telehealth: Payer: Self-pay

## 2022-06-04 ENCOUNTER — Inpatient Hospital Stay: Payer: Medicare Other

## 2022-06-04 ENCOUNTER — Other Ambulatory Visit: Payer: Self-pay | Admitting: Hematology

## 2022-06-04 VITALS — BP 128/60 | HR 90 | Temp 97.8°F | Resp 16 | Ht 68.0 in | Wt 126.3 lb

## 2022-06-04 DIAGNOSIS — E860 Dehydration: Secondary | ICD-10-CM | POA: Diagnosis not present

## 2022-06-04 DIAGNOSIS — D649 Anemia, unspecified: Secondary | ICD-10-CM

## 2022-06-04 DIAGNOSIS — R197 Diarrhea, unspecified: Secondary | ICD-10-CM | POA: Diagnosis not present

## 2022-06-04 DIAGNOSIS — D469 Myelodysplastic syndrome, unspecified: Secondary | ICD-10-CM | POA: Diagnosis not present

## 2022-06-04 DIAGNOSIS — D46Z Other myelodysplastic syndromes: Secondary | ICD-10-CM | POA: Diagnosis not present

## 2022-06-04 DIAGNOSIS — Z23 Encounter for immunization: Secondary | ICD-10-CM | POA: Diagnosis not present

## 2022-06-04 LAB — CMP (CANCER CENTER ONLY)
ALT: 14 U/L (ref 0–44)
AST: 18 U/L (ref 15–41)
Albumin: 4.1 g/dL (ref 3.5–5.0)
Alkaline Phosphatase: 76 U/L (ref 38–126)
Anion gap: 7 (ref 5–15)
BUN: 22 mg/dL (ref 8–23)
CO2: 24 mmol/L (ref 22–32)
Calcium: 9.1 mg/dL (ref 8.9–10.3)
Chloride: 104 mmol/L (ref 98–111)
Creatinine: 1.31 mg/dL — ABNORMAL HIGH (ref 0.44–1.00)
GFR, Estimated: 38 mL/min — ABNORMAL LOW
Glucose, Bld: 95 mg/dL (ref 70–99)
Potassium: 4.6 mmol/L (ref 3.5–5.1)
Sodium: 135 mmol/L (ref 135–145)
Total Bilirubin: 0.4 mg/dL (ref 0.3–1.2)
Total Protein: 7.6 g/dL (ref 6.5–8.1)

## 2022-06-04 LAB — CBC WITH DIFFERENTIAL (CANCER CENTER ONLY)
Abs Immature Granulocytes: 0.04 K/uL (ref 0.00–0.07)
Basophils Absolute: 0 K/uL (ref 0.0–0.1)
Basophils Relative: 0 %
Eosinophils Absolute: 0 K/uL (ref 0.0–0.5)
Eosinophils Relative: 0 %
HCT: 24.3 % — ABNORMAL LOW (ref 36.0–46.0)
Hemoglobin: 8 g/dL — ABNORMAL LOW (ref 12.0–15.0)
Immature Granulocytes: 2 %
Lymphocytes Relative: 42 %
Lymphs Abs: 0.8 K/uL (ref 0.7–4.0)
MCH: 33.5 pg (ref 26.0–34.0)
MCHC: 32.9 g/dL (ref 30.0–36.0)
MCV: 101.7 fL — ABNORMAL HIGH (ref 80.0–100.0)
Monocytes Absolute: 0.7 K/uL (ref 0.1–1.0)
Monocytes Relative: 39 %
Neutro Abs: 0.3 K/uL — CL (ref 1.7–7.7)
Neutrophils Relative %: 17 %
Platelet Count: 73 K/uL — ABNORMAL LOW (ref 150–400)
RBC: 2.39 MIL/uL — ABNORMAL LOW (ref 3.87–5.11)
RDW: 16.6 % — ABNORMAL HIGH (ref 11.5–15.5)
Smear Review: NORMAL
WBC Count: 1.8 K/uL — ABNORMAL LOW (ref 4.0–10.5)
nRBC: 0 % (ref 0.0–0.2)

## 2022-06-04 LAB — SAMPLE TO BLOOD BANK

## 2022-06-04 LAB — PREPARE RBC (CROSSMATCH)

## 2022-06-04 MED ORDER — SODIUM CHLORIDE 0.9 % IV SOLN: INTRAVENOUS | Status: AC

## 2022-06-04 NOTE — Progress Notes (Signed)
Blood transfusion attestation

## 2022-06-04 NOTE — Telephone Encounter (Signed)
CRITICAL VALUE STICKER  CRITICAL VALUE:   ANC 0.3  RECEIVER (on-site recipient of call):  Bonnita Nasuti, [place] NOTIFIED: 10:59  MESSENGER (representative from lab):  Rosann Auerbach  MD NOTIFIED:   Charlies Silvers  TIME OF NOTIFICATION:  10:59

## 2022-06-04 NOTE — Patient Instructions (Signed)

## 2022-06-05 ENCOUNTER — Encounter: Payer: Self-pay | Admitting: Hematology

## 2022-06-05 ENCOUNTER — Inpatient Hospital Stay: Payer: Medicare Other | Attending: Hematology

## 2022-06-05 DIAGNOSIS — D46Z Other myelodysplastic syndromes: Secondary | ICD-10-CM | POA: Diagnosis not present

## 2022-06-05 DIAGNOSIS — D469 Myelodysplastic syndrome, unspecified: Secondary | ICD-10-CM

## 2022-06-05 MED ORDER — SODIUM CHLORIDE 0.9% IV SOLUTION
250.0000 mL | Freq: Once | INTRAVENOUS | Status: AC
  Administered 2022-06-05: 250 mL via INTRAVENOUS

## 2022-06-05 MED ORDER — METHYLPREDNISOLONE SODIUM SUCC 40 MG IJ SOLR
40.0000 mg | Freq: Once | INTRAMUSCULAR | Status: AC
  Administered 2022-06-05: 40 mg via INTRAVENOUS
  Filled 2022-06-05: qty 1

## 2022-06-05 MED ORDER — ACETAMINOPHEN 325 MG PO TABS
650.0000 mg | ORAL_TABLET | Freq: Once | ORAL | Status: AC
  Administered 2022-06-05: 650 mg via ORAL
  Filled 2022-06-05: qty 2

## 2022-06-05 NOTE — Progress Notes (Signed)
HEMATOLOGY/ONCOLOGY CLINIC NOTE  Date of Service:06/04/2022  Patient Care Team: Burnard Bunting, MD as PCP - General (Internal Medicine) Lovena Neighbours Conception Oms, MD as Consulting Physician (Urology)  REFERRING PHYSICIAN: Burnard Bunting, MD  CHIEF COMPLAINTS/PURPOSE OF CONSULTATION:  Follow-up for continued management of pancytopenia from MDS  HISTORY OF PRESENTING ILLNESS: Please see previous notes for details on initial presentation  INTERVAL HISTORY:   Alison Bridges is a 88 y.o. female who is here for continued evaluation and management of her MDS/MPN with SRS F2 mutation who has been on supportive care with ESA's and transfusions as needed.  Alison Bridges completed her course of antibiotics yesterday for C.diff infection. She reports her stools are now formed.  She continues to struggle with fatigue which affects her ADLs.  She reports occasional episodes of nausea without vomiting.  Denies easy bruising or signs of active bleeding.  She denies any fevers, chills, sweats, shortness of breath, chest pain or cough. She has no other complaints.   MEDICAL HISTORY:  Past Medical History:  Diagnosis Date   Anemia    Arthritis    C. difficile diarrhea    Cancer (HCC)    Chronic kidney disease    Depression    Dyspnea    Shortness of breath with low hemoglobin   GERD (gastroesophageal reflux disease)    Hypothyroidism    Myelodysplastic disease ([place])    Osteoporosis    SURGICAL HISTORY: Past Surgical History:  Procedure Laterality Date   ABDOMINAL HYSTERECTOMY     APPENDECTOMY  1998   CATARACT EXTRACTION W/ INTRAOCULAR LENS IMPLANT Bilateral    CHOLECYSTECTOMY  1998   TRANSURETHRAL RESECTION OF BLADDER TUMOR N/A 05/08/2022   Procedure: TRANSURETHRAL RESECTION OF BLADDER TUMOR (TURBT);  Surgeon: [name], MD;  Location: [facility];  Service: Urology;  Laterality: N/A;  ONLY NEEDS 30 MIN   SOCIAL HISTORY: Social History   Socioeconomic History   Marital  status: Widowed    Spouse name: Not on file   Number of children: Not on file   Years of education: Not on file   Highest education level: Not on file  Occupational History   Occupation: Oncologist    Comment: Retired  Tobacco Use   Smoking status: Never   Smokeless tobacco: Never  Vaping Use   Vaping Use: Never used  Substance and Sexual Activity   Alcohol use: No   Drug use: No   Sexual activity: Not on file  Other Topics Concern   Not on file  Social History Narrative   She is tried to stay active --  2 days a week she does senior aerobics.  She also enjoys going for walks, but no standard routine.      She has 3 children and 2 grandchildren.   Social Determinants of Health   Financial Resource Strain: Not on file  Food Insecurity: No Food Insecurity (02/29/2020)   Hunger Vital Sign    Worried About Running Out of Food in the Last Year: Never true    Ran Out of Food in the Last Year: Never true  Transportation Needs: No Transportation Needs (02/29/2020)   PRAPARE - Hydrologist (Medical): No    Lack of Transportation (Non-Medical): No  Physical Activity: Not on file  Stress: Not on file  Social Connections: Not on file  Intimate Partner Violence: Not on file     FAMILY HISTORY: Family History  Problem Relation Age of Onset  Heart attack Mother 33   Pancreatic cancer Father    Parkinson's disease Sister    Diabetes Brother        Multiple complications   Heart disease Brother        Not sure what happened   Dementia Brother      ALLERGIES:   has No Known Allergies.   MEDICATIONS:  Current Outpatient Medications  Medication Sig Dispense Refill   Ascorbic Acid (VITAMIN C PO) Take 1 tablet by mouth daily.     Azelastine HCl 137 MCG/SPRAY SOLN Place 1 spray into both nostrils daily as needed (allergies).     b complex vitamins capsule Take 1 capsule by mouth daily.     cholecalciferol (VITAMIN D) 1000 UNITS tablet  Take 1,000 Units by mouth daily.     Darbepoetin Alfa (ARANESP, ALBUMIN FREE,) 150 MCG/0.3ML SOSY injection Inject 150 mcg into the skin every 30 (thirty) days. 12-19-21 last dose given     levothyroxine (SYNTHROID, LEVOTHROID) 125 MCG tablet Take 125 mcg by mouth daily before breakfast.     OVER THE COUNTER MEDICATION Take 1 capsule by mouth daily. Blood Builder     pantoprazole (PROTONIX) 40 MG tablet Take 40 mg by mouth daily as needed (acid reflux).     phenazopyridine (PYRIDIUM) 200 MG tablet Take 1 tablet (200 mg total) by mouth 3 (three) times daily as needed (for pain with urination). 30 tablet 0   sertraline (ZOLOFT) 100 MG tablet Take 100 mg by mouth daily.     oxybutynin (DITROPAN) 5 MG tablet Take 1 tablet (5 mg total) by mouth every 8 (eight) hours as needed for bladder spasms. (Patient not taking: Reported on 06/04/2022) 30 tablet 1   No current facility-administered medications for this visit.   Facility-Administered Medications Ordered in Other Visits  Medication Dose Route Frequency Provider Last Rate Last Admin   0.9 %  sodium chloride infusion (Manually program via Guardrails IV Fluids)  250 mL Intravenous Once [name], MD       methylPREDNISolone sodium succinate (SOLU-MEDROL) 40 mg/mL injection 40 mg  40 mg Intravenous Once [name], MD         REVIEW OF SYSTEMS:   10 Point review of Systems was done is negative except as noted above.  PHYSICAL EXAMINATION: ECOG PERFORMANCE STATUS: 2 - Symptomatic, <50% confined to bed  Vitals:   06/04/22 1021  BP: 128/60  Pulse: 90  Resp: 16  Temp: 97.8 F (36.6 C)  SpO2: 98%   Filed Weights   06/04/22 1021  Weight: 126 lb 4.8 oz (57.3 kg)   Body mass index is 19.2 kg/m. NAD GENERAL:alert, in no acute distress and comfortable SKIN: no acute rashes, no significant lesions EYES: conjunctiva are pink and non-injected, sclera anicteric NECK: supple, no JVD LUNGS: clear to auscultation b/l with  normal respiratory effort HEART: regular rate & rhythm ABDOMEN:  normoactive bowel sounds , non tender, not distended. Extremity: no pedal edema PSYCH: alert & oriented x 3 with fluent speech NEURO: no focal motor/sensory deficits  LABORATORY DATA:  I have reviewed the data as listed     Latest Ref Rng & Units 06/04/2022   10:08 AM 05/24/2022    1:49 PM 05/17/2022    9:15 AM  CBC  WBC 4.0 - 10.5 K/uL 1.8  2.5  1.8   Hemoglobin 12.0 - 15.0 g/dL 8.0  8.3  8.1   Hematocrit 36.0 - 46.0 % 24.3  25.4  25.3  Platelets 150 - 400 K/uL 73  52  61   ANC 700     Latest Ref Rng & Units 06/04/2022   10:08 AM 05/24/2022    1:49 PM 05/17/2022    9:15 AM  CMP  Glucose 70 - 99 mg/dL 95  115  96   BUN 8 - 23 mg/dL '22  27  24   '$ Creatinine 0.44 - 1.00 mg/dL 1.31  1.51  1.36   Sodium 135 - 145 mmol/L 135  134  135   Potassium 3.5 - 5.1 mmol/L 4.6  3.7  4.3   Chloride 98 - 111 mmol/L 104  106  105   CO2 22 - 32 mmol/L '24  21  24   '$ Calcium 8.9 - 10.3 mg/dL 9.1  9.2  8.9   Total Protein 6.5 - 8.1 g/dL 7.6  7.6  7.3   Total Bilirubin 0.3 - 1.2 mg/dL 0.4  0.5  0.4   Alkaline Phos 38 - 126 U/L 76  79  81   AST 15 - 41 U/L '18  17  17   '$ ALT 0 - 44 U/L '14  16  16    '$ . Lab Results  Component Value Date   IRON 27 (L) 01/13/2022   TIBC 222 (L) 01/13/2022   IRONPCTSAT 12 01/13/2022   (Iron and TIBC)  Lab Results  Component Value Date   FERRITIN 558 (H) 01/13/2022     01/10/2020 Cytogenetics [id number]):    01/10/2020 FISH Panel ([id number]):   01/10/2020 BM Bx Report ([id number]):    12/16/2019 JAK2, MPL, CALR Panel Report:    RADIOGRAPHIC STUDIES: I have personally reviewed the radiological images as listed and agreed with the findings in the report. No results found.   ASSESSMENT & PLAN:   DEISHA STULL is a 88 y.o. female with:   Pancytopenia - likely from MDS +/- MPN -01/10/2020 BM Bx Surgical Pathology Report ([id number]) revealed "BONE MARROW, ASPIRATE,  CLOT, CORE: -Hypercellular marrow with myeloid hyperplasia and megakaryocytic atypia  PERIPHERAL BLOOD: -  Pancytopenia". -12/16/19 JAK2 (including V617 and Exon 12), MPL, and CALR-Next Generation Sequencing shows "Tier II: Variants of Potential Clinical Significance - SRSF2 p.Pro95His"  2.  Hepatosplenomegaly  -Likely from her MDS/MPN noted on CT abdomen pelvis January 2022  3.  Noninvasive low-grade papillary urothelial carcinoma: --Underwent TURBT on 05/08/2022. Under the care of urologist, Dr. Lovena Neighbours.  4. C.Diff diarrhea: --GI PCR panel was negative.  --Completed course of Dificid yesterday --Symptoms have resolved.   PLAN: -Labs done today were reviewed in detail with the patient. WBC 1.8, ANC 0.3, Hgb 8.0, Plt 73K -Patient needs 1 unit of PRBC -She will received 1 L of IV fluids due to recent history of diarrhea.  -Avoid NSAIDs or other medications that could worsen thrombocytopenia Continue vitamin B complex -No indication for G-CSF at this time unless the patient has significant infection in the context of neutropenia -Might need to consider prophylactic antivirals/antibacterials/antifungals if are any signs of recurrent infections.  FOLLOW UP: -1 L of IV fluids today.  -Blood transfusion tomorrow -RTC on 06/17/2022 for labs and next aranesp injection -RTC on 07/18/2022 for labs, f/u visit with Dr. Bryn Saline Limbo and aranesp injection   All of the patient's questions were answered with apparent satisfaction. The patient knows to call the clinic with any problems, questions or concerns.  I have spent a total of 30 minutes minutes of face-to-face and non-face-to-face time, preparing to see the patient, performing a medically appropriate examination,  counseling and educating the patient, ordering medications/tests, documenting clinical information in the electronic health record,and care coordination.   Dede Query PA-C Dept of Hematology and [place] at  [facility] Phone: [phone]

## 2022-06-05 NOTE — Patient Instructions (Signed)

## 2022-06-06 LAB — TYPE AND SCREEN
ABO/RH(D): O POS
Antibody Screen: NEGATIVE
Unit division: 0

## 2022-06-06 LAB — BPAM RBC
Blood Product Expiration Date: 202311232359
ISSUE DATE / TIME: 202311011121
Unit Type and Rh: 5100

## 2022-06-18 ENCOUNTER — Inpatient Hospital Stay: Payer: Medicare Other

## 2022-06-21 ENCOUNTER — Other Ambulatory Visit: Payer: Self-pay

## 2022-06-21 DIAGNOSIS — R0981 Nasal congestion: Secondary | ICD-10-CM | POA: Diagnosis not present

## 2022-06-21 DIAGNOSIS — J449 Chronic obstructive pulmonary disease, unspecified: Secondary | ICD-10-CM | POA: Diagnosis not present

## 2022-06-21 DIAGNOSIS — R5383 Other fatigue: Secondary | ICD-10-CM | POA: Diagnosis not present

## 2022-06-21 DIAGNOSIS — Z1152 Encounter for screening for COVID-19: Secondary | ICD-10-CM | POA: Diagnosis not present

## 2022-06-21 DIAGNOSIS — J40 Bronchitis, not specified as acute or chronic: Secondary | ICD-10-CM | POA: Diagnosis not present

## 2022-06-21 DIAGNOSIS — R053 Chronic cough: Secondary | ICD-10-CM | POA: Diagnosis not present

## 2022-06-21 DIAGNOSIS — A0472 Enterocolitis due to Clostridium difficile, not specified as recurrent: Secondary | ICD-10-CM | POA: Diagnosis not present

## 2022-06-21 DIAGNOSIS — D509 Iron deficiency anemia, unspecified: Secondary | ICD-10-CM | POA: Diagnosis not present

## 2022-06-21 DIAGNOSIS — D469 Myelodysplastic syndrome, unspecified: Secondary | ICD-10-CM | POA: Diagnosis not present

## 2022-06-24 ENCOUNTER — Inpatient Hospital Stay: Payer: Medicare Other

## 2022-07-02 ENCOUNTER — Inpatient Hospital Stay: Payer: Medicare Other

## 2022-07-02 ENCOUNTER — Other Ambulatory Visit: Payer: Self-pay

## 2022-07-02 VITALS — BP 123/52 | HR 92 | Resp 18

## 2022-07-02 DIAGNOSIS — D469 Myelodysplastic syndrome, unspecified: Secondary | ICD-10-CM

## 2022-07-02 DIAGNOSIS — D539 Nutritional anemia, unspecified: Secondary | ICD-10-CM

## 2022-07-02 DIAGNOSIS — D46Z Other myelodysplastic syndromes: Secondary | ICD-10-CM | POA: Diagnosis not present

## 2022-07-02 LAB — CBC WITH DIFFERENTIAL (CANCER CENTER ONLY)
Abs Immature Granulocytes: 0.02 10*3/uL (ref 0.00–0.07)
Basophils Absolute: 0 10*3/uL (ref 0.0–0.1)
Basophils Relative: 0 %
Eosinophils Absolute: 0 10*3/uL (ref 0.0–0.5)
Eosinophils Relative: 0 %
HCT: 24.1 % — ABNORMAL LOW (ref 36.0–46.0)
Hemoglobin: 7.7 g/dL — ABNORMAL LOW (ref 12.0–15.0)
Immature Granulocytes: 1 %
Lymphocytes Relative: 34 %
Lymphs Abs: 0.7 10*3/uL (ref 0.7–4.0)
MCH: 32 pg (ref 26.0–34.0)
MCHC: 32 g/dL (ref 30.0–36.0)
MCV: 100 fL (ref 80.0–100.0)
Monocytes Absolute: 0.7 10*3/uL (ref 0.1–1.0)
Monocytes Relative: 39 %
Neutro Abs: 0.5 10*3/uL — ABNORMAL LOW (ref 1.7–7.7)
Neutrophils Relative %: 26 %
Platelet Count: 56 10*3/uL — ABNORMAL LOW (ref 150–400)
RBC: 2.41 MIL/uL — ABNORMAL LOW (ref 3.87–5.11)
RDW: 18.7 % — ABNORMAL HIGH (ref 11.5–15.5)
WBC Count: 1.9 10*3/uL — ABNORMAL LOW (ref 4.0–10.5)
nRBC: 0 % (ref 0.0–0.2)

## 2022-07-02 LAB — CMP (CANCER CENTER ONLY)
ALT: 15 U/L (ref 0–44)
AST: 17 U/L (ref 15–41)
Albumin: 4.3 g/dL (ref 3.5–5.0)
Alkaline Phosphatase: 89 U/L (ref 38–126)
Anion gap: 7 (ref 5–15)
BUN: 26 mg/dL — ABNORMAL HIGH (ref 8–23)
CO2: 24 mmol/L (ref 22–32)
Calcium: 9.4 mg/dL (ref 8.9–10.3)
Chloride: 103 mmol/L (ref 98–111)
Creatinine: 1.4 mg/dL — ABNORMAL HIGH (ref 0.44–1.00)
GFR, Estimated: 36 mL/min — ABNORMAL LOW (ref >60)
Glucose, Bld: 98 mg/dL (ref 70–99)
Potassium: 4.3 mmol/L (ref 3.5–5.1)
Sodium: 134 mmol/L — ABNORMAL LOW (ref 135–145)
Total Bilirubin: 0.5 mg/dL (ref 0.3–1.2)
Total Protein: 7.7 g/dL (ref 6.5–8.1)

## 2022-07-02 LAB — SAMPLE TO BLOOD BANK

## 2022-07-02 MED ORDER — DARBEPOETIN ALFA 150 MCG/0.3ML IJ SOSY
300.0000 ug | PREFILLED_SYRINGE | Freq: Once | INTRAMUSCULAR | Status: AC
  Administered 2022-07-02: 300 ug via SUBCUTANEOUS
  Filled 2022-07-02: qty 0.6

## 2022-07-02 NOTE — Patient Instructions (Signed)
Darbepoetin Alfa injection ?What is this medication? ?DARBEPOETIN ALFA (dar be POE e tin  AL fa) helps your body make more red blood cells. It is used to treat anemia caused by chronic kidney failure and chemotherapy. ?This medicine may be used for other purposes; ask your health care provider or pharmacist if you have questions. ?COMMON BRAND NAME(S): Aranesp ?What should I tell my care team before I take this medication? ?They need to know if you have any of these conditions: ?blood clotting disorders or history of blood clots ?cancer patient not on chemotherapy ?cystic fibrosis ?heart disease, such as angina, heart failure, or a history of a heart attack ?hemoglobin level of 12 g/dL or greater ?high blood pressure ?low levels of folate, iron, or vitamin B12 ?seizures ?an unusual or allergic reaction to darbepoetin, erythropoietin, albumin, hamster proteins, latex, other medicines, foods, dyes, or preservatives ?pregnant or trying to get pregnant ?breast-feeding ?How should I use this medication? ?This medicine is for injection into a vein or under the skin. It is usually given by a health care professional in a hospital or clinic setting. ?If you get this medicine at home, you will be taught how to prepare and give this medicine. Use exactly as directed. Take your medicine at regular intervals. Do not take your medicine more often than directed. ?It is important that you put your used needles and syringes in a special sharps container. Do not put them in a trash can. If you do not have a sharps container, call your pharmacist or healthcare provider to get one. ?A special MedGuide will be given to you by the pharmacist with each prescription and refill. Be sure to read this information carefully each time. ?Talk to your pediatrician regarding the use of this medicine in children. While this medicine may be used in children as young as 1 month of age for selected conditions, precautions do apply. ?Overdosage: If  you think you have taken too much of this medicine contact a poison control center or emergency room at once. ?NOTE: This medicine is only for you. Do not share this medicine with others. ?What if I miss a dose? ?If you miss a dose, take it as soon as you can. If it is almost time for your next dose, take only that dose. Do not take double or extra doses. ?What may interact with this medication? ?Do not take this medicine with any of the following medications: ?epoetin alfa ?This list may not describe all possible interactions. Give your health care provider a list of all the medicines, herbs, non-prescription drugs, or dietary supplements you use. Also tell them if you smoke, drink alcohol, or use illegal drugs. Some items may interact with your medicine. ?What should I watch for while using this medication? ?Your condition will be monitored carefully while you are receiving this medicine. ?You may need blood work done while you are taking this medicine. ?This medicine may cause a decrease in vitamin B6. You should make sure that you get enough vitamin B6 while you are taking this medicine. Discuss the foods you eat and the vitamins you take with your health care professional. ?What side effects may I notice from receiving this medication? ?Side effects that you should report to your doctor or health care professional as soon as possible: ?allergic reactions like skin rash, itching or hives, swelling of the face, lips, or tongue ?breathing problems ?changes in vision ?chest pain ?confusion, trouble speaking or understanding ?feeling faint or lightheaded, falls ?high blood   pressure ?muscle aches or pains ?pain, swelling, warmth in the leg ?rapid weight gain ?severe headaches ?sudden numbness or weakness of the face, arm or leg ?trouble walking, dizziness, loss of balance or coordination ?seizures (convulsions) ?swelling of the ankles, feet, hands ?unusually weak or tired ?Side effects that usually do not require  medical attention (report to your doctor or health care professional if they continue or are bothersome): ?diarrhea ?fever, chills (flu-like symptoms) ?headaches ?nausea, vomiting ?redness, stinging, or swelling at site where injected ?This list may not describe all possible side effects. Call your doctor for medical advice about side effects. You may report side effects to FDA at 1-800-FDA-1088. ?Where should I keep my medication? ?Keep out of the reach of children and pets. ?Store in a refrigerator. Do not freeze. Do not shake. Throw away any unused portion if using a single-dose vial. Multi-dose vials can be kept in the refrigerator for up to 21 days after the initial dose. Throw away unused medicine. ?To get rid of medications that are no longer needed or have expired: ?Take the medication to a medication take-back program. Check with your pharmacy or law enforcement to find a location. ?If you cannot return the medication, ask your pharmacist or care team how to get rid of the medication safely. ?NOTE: This sheet is a summary. It may not cover all possible information. If you have questions about this medicine, talk to your doctor, pharmacist, or health care provider. ?? 2023 [employer]/Gold Standard (2021-07-23 00:00:00) ? ?

## 2022-07-02 NOTE — Progress Notes (Signed)
Patient here today for Arenesp injection. HGB is 7.7 at this time. Patient denied needing any blood transfusion, vital signs stable. Informed patient to let us know if she started feeling weak or short of breath. We could always give her a blood transfusion if needed. Voiced understanding. Provider notified.

## 2022-07-05 DIAGNOSIS — R059 Cough, unspecified: Secondary | ICD-10-CM | POA: Diagnosis not present

## 2022-07-05 DIAGNOSIS — R5383 Other fatigue: Secondary | ICD-10-CM | POA: Diagnosis not present

## 2022-07-05 DIAGNOSIS — D649 Anemia, unspecified: Secondary | ICD-10-CM | POA: Diagnosis not present

## 2022-07-05 DIAGNOSIS — R0600 Dyspnea, unspecified: Secondary | ICD-10-CM | POA: Diagnosis not present

## 2022-07-16 ENCOUNTER — Other Ambulatory Visit: Payer: Self-pay

## 2022-07-16 DIAGNOSIS — D469 Myelodysplastic syndrome, unspecified: Secondary | ICD-10-CM

## 2022-07-17 ENCOUNTER — Inpatient Hospital Stay: Payer: Medicare Other

## 2022-07-17 ENCOUNTER — Other Ambulatory Visit: Payer: Self-pay

## 2022-07-17 ENCOUNTER — Inpatient Hospital Stay: Payer: Medicare Other | Attending: Hematology | Admitting: Hematology

## 2022-07-17 VITALS — BP 135/55 | HR 86 | Temp 97.7°F | Resp 18 | Ht 68.0 in | Wt 124.0 lb

## 2022-07-17 DIAGNOSIS — D61818 Other pancytopenia: Secondary | ICD-10-CM | POA: Diagnosis not present

## 2022-07-17 DIAGNOSIS — D46Z Other myelodysplastic syndromes: Secondary | ICD-10-CM | POA: Diagnosis not present

## 2022-07-17 DIAGNOSIS — D469 Myelodysplastic syndrome, unspecified: Secondary | ICD-10-CM

## 2022-07-17 DIAGNOSIS — D539 Nutritional anemia, unspecified: Secondary | ICD-10-CM

## 2022-07-17 DIAGNOSIS — D649 Anemia, unspecified: Secondary | ICD-10-CM

## 2022-07-17 LAB — CMP (CANCER CENTER ONLY)
ALT: 12 U/L (ref 0–44)
AST: 17 U/L (ref 15–41)
Albumin: 4 g/dL (ref 3.5–5.0)
Alkaline Phosphatase: 81 U/L (ref 38–126)
Anion gap: 4 — ABNORMAL LOW (ref 5–15)
BUN: 28 mg/dL — ABNORMAL HIGH (ref 8–23)
CO2: 27 mmol/L (ref 22–32)
Calcium: 9.3 mg/dL (ref 8.9–10.3)
Chloride: 104 mmol/L (ref 98–111)
Creatinine: 1.23 mg/dL — ABNORMAL HIGH (ref 0.44–1.00)
GFR, Estimated: 41 mL/min — ABNORMAL LOW (ref >60)
Glucose, Bld: 106 mg/dL — ABNORMAL HIGH (ref 70–99)
Potassium: 4.6 mmol/L (ref 3.5–5.1)
Sodium: 135 mmol/L (ref 135–145)
Total Bilirubin: 0.5 mg/dL (ref 0.3–1.2)
Total Protein: 6.9 g/dL (ref 6.5–8.1)

## 2022-07-17 LAB — CBC WITH DIFFERENTIAL (CANCER CENTER ONLY)
Abs Immature Granulocytes: 0.04 10*3/uL (ref 0.00–0.07)
Basophils Absolute: 0 10*3/uL (ref 0.0–0.1)
Basophils Relative: 0 %
Eosinophils Absolute: 0 10*3/uL (ref 0.0–0.5)
Eosinophils Relative: 0 %
HCT: 26.2 % — ABNORMAL LOW (ref 36.0–46.0)
Hemoglobin: 8.3 g/dL — ABNORMAL LOW (ref 12.0–15.0)
Immature Granulocytes: 2 %
Lymphocytes Relative: 36 %
Lymphs Abs: 0.6 10*3/uL — ABNORMAL LOW (ref 0.7–4.0)
MCH: 33.2 pg (ref 26.0–34.0)
MCHC: 31.7 g/dL (ref 30.0–36.0)
MCV: 104.8 fL — ABNORMAL HIGH (ref 80.0–100.0)
Monocytes Absolute: 0.6 10*3/uL (ref 0.1–1.0)
Monocytes Relative: 35 %
Neutro Abs: 0.5 10*3/uL — ABNORMAL LOW (ref 1.7–7.7)
Neutrophils Relative %: 27 %
Platelet Count: 57 10*3/uL — ABNORMAL LOW (ref 150–400)
RBC: 2.5 MIL/uL — ABNORMAL LOW (ref 3.87–5.11)
RDW: 21.1 % — ABNORMAL HIGH (ref 11.5–15.5)
WBC Count: 1.7 10*3/uL — ABNORMAL LOW (ref 4.0–10.5)
nRBC: 0 % (ref 0.0–0.2)
nRBC: 0 /100 WBC

## 2022-07-17 LAB — SAMPLE TO BLOOD BANK

## 2022-07-17 MED ORDER — NYSTATIN 100000 UNIT/ML MT SUSP: 5.0000 mL | Freq: Four times a day (QID) | OROMUCOSAL | 0 refills | Status: DC

## 2022-07-17 MED ORDER — DARBEPOETIN ALFA 150 MCG/0.3ML IJ SOSY: 300.0000 ug | PREFILLED_SYRINGE | Freq: Once | INTRAMUSCULAR | Status: DC

## 2022-07-17 MED ORDER — DARBEPOETIN ALFA 300 MCG/0.6ML IJ SOSY
300.0000 ug | PREFILLED_SYRINGE | Freq: Once | INTRAMUSCULAR | Status: AC
  Administered 2022-07-17: 300 ug via SUBCUTANEOUS
  Filled 2022-07-17: qty 0.6

## 2022-07-17 NOTE — Progress Notes (Signed)
Patient seen by MD today  Vitals are within treatment parameters.  Labs reviewed: and are within treatment parameters." Dr Irene Limbo aware of CR of 1.23  Per physician team, patient is ready for treatment. Please note that modifications are being made to the treatment plan including Pt ok to get injection today instead of waiting the full 4 weeks per Dr Irene Limbo.

## 2022-07-17 NOTE — Progress Notes (Signed)
HEMATOLOGY/ONCOLOGY CLINIC NOTE  Date of Service: 07/17/22   Patient Care Team: Burnard Bunting, MD as PCP - General (Internal Medicine) Lovena Neighbours Conception Oms, MD as Consulting Physician (Urology)  REFERRING PHYSICIAN: Burnard Bunting, MD  CHIEF COMPLAINTS/PURPOSE OF CONSULTATION:  Follow-up for continued management of pancytopenia from MDS  HISTORY OF PRESENTING ILLNESS: Please see previous notes for details on initial presentation  INTERVAL HISTORY:   Alison Bridges is a 88 y.o. female who is here for continued evaluation and management of her MDS/MPN with SRS F2 mutation who has been on supportive care with ESA's and transfusions as needed.    Patient was seen by Dede Query, PA-C, on 06/04/2022. During that visit, she had completed her antibiotics for C. Diff infection. She also complained of fatigue which affects her ADLs and occasional nausea without vomiting.   Patient reports she has not been doing well since our last visit. She reports she has bronchitis since last month. She is not taking any antibiotics, but is taking mucinex and nasal spray. She reports her cough is getting better, but still complains of consistent fatigue and occasional shortness of breath. Patient denies fever and chills. Patient notes that her cough is 80% better than last month.  Patient does complain of loss of appetite, weight loss, tongue soreness, and black stool. Denies Hematochezia.  She has received the influenza vaccine, but denies RSV vaccine and COVID-19 Booster.   Patient wants Dr. Irene Limbo to talk to his daughter at next visit to help the patient with her future decisions.   MEDICAL HISTORY:  Past Medical History:  Diagnosis Date   Anemia    Arthritis    C. difficile diarrhea    Cancer (HCC)    Chronic kidney disease    Depression    Dyspnea    Shortness of breath with low hemoglobin   GERD (gastroesophageal reflux disease)    Hypothyroidism    Myelodysplastic disease  ([place])    Osteoporosis    SURGICAL HISTORY: Past Surgical History:  Procedure Laterality Date   ABDOMINAL HYSTERECTOMY     APPENDECTOMY  1998   CATARACT EXTRACTION W/ INTRAOCULAR LENS IMPLANT Bilateral    CHOLECYSTECTOMY  1998   TRANSURETHRAL RESECTION OF BLADDER TUMOR N/A 05/08/2022   Procedure: TRANSURETHRAL RESECTION OF BLADDER TUMOR (TURBT);  Surgeon: [name], MD;  Location: [facility];  Service: Urology;  Laterality: N/A;  ONLY NEEDS 30 MIN   SOCIAL HISTORY: Social History   Socioeconomic History   Marital status: Widowed    Spouse name: Not on file   Number of children: Not on file   Years of education: Not on file   Highest education level: Not on file  Occupational History   Occupation: Oncologist    Comment: Retired  Tobacco Use   Smoking status: Never   Smokeless tobacco: Never  Vaping Use   Vaping Use: Never used  Substance and Sexual Activity   Alcohol use: No   Drug use: No   Sexual activity: Not on file  Other Topics Concern   Not on file  Social History Narrative   She is tried to stay active --  2 days a week she does senior aerobics.  She also enjoys going for walks, but no standard routine.      She has 3 children and 2 grandchildren.   Social Determinants of Health   Financial Resource Strain: Not on file  Food Insecurity: No Food Insecurity (02/29/2020)   Hunger Vital Sign  Worried About Charity fundraiser in the Last Year: Never true    [place] in the Last Year: Never true  Transportation Needs: No Transportation Needs (02/29/2020)   PRAPARE - Hydrologist (Medical): No    Lack of Transportation (Non-Medical): No  Physical Activity: Not on file  Stress: Not on file  Social Connections: Not on file  Intimate Partner Violence: Not on file     FAMILY HISTORY: Family History  Problem Relation Age of Onset   Heart attack Mother 45   Pancreatic cancer Father    Parkinson's disease  Sister    Diabetes Brother        Multiple complications   Heart disease Brother        Not sure what happened   Dementia Brother      ALLERGIES:   has No Known Allergies.   MEDICATIONS:  Current Outpatient Medications  Medication Sig Dispense Refill   Ascorbic Acid (VITAMIN C PO) Take 1 tablet by mouth daily.     Azelastine HCl 137 MCG/SPRAY SOLN Place 1 spray into both nostrils daily as needed (allergies).     b complex vitamins capsule Take 1 capsule by mouth daily.     cholecalciferol (VITAMIN D) 1000 UNITS tablet Take 1,000 Units by mouth daily.     Darbepoetin Alfa (ARANESP, ALBUMIN FREE,) 150 MCG/0.3ML SOSY injection Inject 150 mcg into the skin every 30 (thirty) days. 12-19-21 last dose given     levothyroxine (SYNTHROID, LEVOTHROID) 125 MCG tablet Take 125 mcg by mouth daily before breakfast.     OVER THE COUNTER MEDICATION Take 1 capsule by mouth daily. Blood Builder     oxybutynin (DITROPAN) 5 MG tablet Take 1 tablet (5 mg total) by mouth every 8 (eight) hours as needed for bladder spasms. (Patient not taking: Reported on 06/04/2022) 30 tablet 1   pantoprazole (PROTONIX) 40 MG tablet Take 40 mg by mouth daily as needed (acid reflux).     phenazopyridine (PYRIDIUM) 200 MG tablet Take 1 tablet (200 mg total) by mouth 3 (three) times daily as needed (for pain with urination). 30 tablet 0   sertraline (ZOLOFT) 100 MG tablet Take 100 mg by mouth daily.     No current facility-administered medications for this visit.     REVIEW OF SYSTEMS:   10 Point review of Systems was done is negative except as noted above.  PHYSICAL EXAMINATION: ECOG PERFORMANCE STATUS: 2 - Symptomatic, <50% confined to bed  Vitals:   07/17/22 1115  BP: (!) 135/55  Pulse: 86  Resp: 18  Temp: 97.7 F (36.5 C)  SpO2: 97%    Filed Weights   07/17/22 1115  Weight: 124 lb (56.2 kg)    Body mass index is 18.85 kg/m. NAD GENERAL:alert, in no acute distress and comfortable SKIN: no acute  rashes, no significant lesions EYES: conjunctiva are pink and non-injected, sclera anicteric NECK: supple, no JVD LUNGS: clear to auscultation b/l with normal respiratory effort HEART: regular rate & rhythm ABDOMEN:  normoactive bowel sounds , non tender, not distended. Extremity: no pedal edema PSYCH: alert & oriented x 3 with fluent speech NEURO: no focal motor/sensory deficits  LABORATORY DATA:  I have reviewed the data as listed     Latest Ref Rng & Units 07/17/2022    9:55 AM 07/02/2022    9:30 AM 06/04/2022   10:08 AM  CBC  WBC 4.0 - 10.5 K/uL 1.7  1.9  1.8  Hemoglobin 12.0 - 15.0 g/dL 8.3  7.7  8.0   Hematocrit 36.0 - 46.0 % 26.2  24.1  24.3   Platelets 150 - 400 K/uL 57  56  73   ANC 700     Latest Ref Rng & Units 07/22/2022    3:24 AM 07/21/2022    6:10 PM 07/21/2022    9:30 AM  CMP  Glucose 70 - 99 mg/dL 133  127  130   BUN 8 - 23 mg/dL 44  48  46   Creatinine 0.44 - 1.00 mg/dL 2.06  2.44  2.49   Sodium 135 - 145 mmol/L 131  130  129   Potassium 3.5 - 5.1 mmol/L 5.2  5.6  5.9   Chloride 98 - 111 mmol/L 103  102  98   CO2 22 - 32 mmol/L '21  18  17   '$ Calcium 8.9 - 10.3 mg/dL 8.3  8.5  8.7   Total Protein 6.5 - 8.1 g/dL 6.4   6.8   Total Bilirubin 0.3 - 1.2 mg/dL 0.8   0.5   Alkaline Phos 38 - 126 U/L 60   64   AST 15 - 41 U/L 25   37   ALT 0 - 44 U/L 14   19    . Lab Results  Component Value Date   IRON 27 (L) 01/13/2022   TIBC 222 (L) 01/13/2022   IRONPCTSAT 12 01/13/2022   (Iron and TIBC)  Lab Results  Component Value Date   FERRITIN 558 (H) 01/13/2022     01/10/2020 Cytogenetics [id number]):    01/10/2020 FISH Panel ([id number]):   01/10/2020 BM Bx Report ([id number]):    12/16/2019 JAK2, MPL, CALR Panel Report:    RADIOGRAPHIC STUDIES: I have personally reviewed the radiological images as listed and agreed with the findings in the report. No results found.   ASSESSMENT & PLAN:   SHEYLIN SCHARNHORST is a 88 y.o. female with:    Pancytopenia - likely from MDS +/- MPN -01/10/2020 BM Bx Surgical Pathology Report ([id number]) revealed "BONE MARROW, ASPIRATE, CLOT, CORE: -Hypercellular marrow with myeloid hyperplasia and megakaryocytic atypia  PERIPHERAL BLOOD: -  Pancytopenia". -12/16/19 JAK2 (including V617 and Exon 12), MPL, and CALR-Next Generation Sequencing shows "Tier II: Variants of Potential Clinical Significance - SRSF2 p.Pro95His"  2.  Hepatosplenomegaly  -Likely from her MDS/MPN noted on CT abdomen pelvis January 2022  3.  Noninvasive low-grade papillary urothelial carcinoma: --Underwent TURBT on 05/08/2022. Under the care of urologist, Dr. Lovena Neighbours.  4. C.Diff diarrhea: --GI PCR panel was negative.  --Completed course of Dificid yesterday --Symptoms have resolved.   PLAN: -Discussed lab results from today with the patient. CBC shows WBC of 1.7 K, hemoglobin of 8.3 K, hematocrit of 26.2%, and platelets of 57. CMP shows blood glucose of 106 and creatinine of 1.23. -Discussed her monthly shots to help her with hemoglobin levels.  -Recommended RSV vaccine and COVID-19 Booster. -Discussed the consideration of possibly considering G-CSF to help her with recurrent/severe infections.   -Discussed the risks of starting antibiotics due to recent C. Diff. -Discussed the need for possibly considering anti-microbial prophylaxis -Patient will let us know if her bronchitis symptoms worsens.  -Prescribe mouthwash for mild oral thrush  -Will have the patient's daughter on the phone during her next visit.   FOLLOW UP: Plz schedule monthly labs and Aranesp x 6 MD visit in 4 weeks The total time spent in the appointment was 30 minutes* .  All of the  patient's questions were answered with apparent satisfaction. The patient knows to call the clinic with any problems, questions or concerns.   Alison Lone MD MS AAHIVMS [facility] Hematology/Oncology Physician [facility]  .*Total Encounter Time as defined  by the Centers for Medicare and Medicaid Services includes, in addition to the face-to-face time of a patient visit (documented in the note above) non-face-to-face time: obtaining and reviewing outside history, ordering and reviewing medications, tests or procedures, care coordination (communications with other health care professionals or caregivers) and documentation in the medical record.   I, Cleda Mccreedy, am acting as a Education administrator for Alison Lone, MD. .I have reviewed the above documentation for accuracy and completeness, and I agree with the above. [name] MD

## 2022-07-19 LAB — IGG, IGA, IGM
IgA: 277 mg/dL (ref 64–422)
IgG (Immunoglobin G), Serum: 1620 mg/dL — ABNORMAL HIGH (ref 586–1602)
IgM (Immunoglobulin M), Srm: 285 mg/dL — ABNORMAL HIGH (ref 26–217)

## 2022-07-20 ENCOUNTER — Other Ambulatory Visit: Payer: Self-pay

## 2022-07-20 ENCOUNTER — Emergency Department (HOSPITAL_COMMUNITY): Payer: Medicare Other

## 2022-07-20 ENCOUNTER — Emergency Department (HOSPITAL_COMMUNITY)
Admission: EM | Admit: 2022-07-20 | Discharge: 2022-07-20 | Disposition: A | Discharge status: Home / Self Care | Payer: Medicare Other | Source: Home / Self Care | Attending: Emergency Medicine | Admitting: Emergency Medicine

## 2022-07-20 DIAGNOSIS — S82832A Other fracture of upper and lower end of left fibula, initial encounter for closed fracture: Secondary | ICD-10-CM | POA: Diagnosis not present

## 2022-07-20 DIAGNOSIS — E039 Hypothyroidism, unspecified: Secondary | ICD-10-CM | POA: Diagnosis present

## 2022-07-20 DIAGNOSIS — S61512A Laceration without foreign body of left wrist, initial encounter: Secondary | ICD-10-CM | POA: Insufficient documentation

## 2022-07-20 DIAGNOSIS — D61818 Other pancytopenia: Secondary | ICD-10-CM | POA: Diagnosis present

## 2022-07-20 DIAGNOSIS — S82202D Unspecified fracture of shaft of left tibia, subsequent encounter for closed fracture with routine healing: Secondary | ICD-10-CM | POA: Diagnosis not present

## 2022-07-20 DIAGNOSIS — S52612A Displaced fracture of left ulna styloid process, initial encounter for closed fracture: Secondary | ICD-10-CM | POA: Diagnosis not present

## 2022-07-20 DIAGNOSIS — W19XXXA Unspecified fall, initial encounter: Secondary | ICD-10-CM

## 2022-07-20 DIAGNOSIS — Z23 Encounter for immunization: Secondary | ICD-10-CM | POA: Diagnosis not present

## 2022-07-20 DIAGNOSIS — I959 Hypotension, unspecified: Secondary | ICD-10-CM | POA: Diagnosis not present

## 2022-07-20 DIAGNOSIS — S42302D Unspecified fracture of shaft of humerus, left arm, subsequent encounter for fracture with routine healing: Secondary | ICD-10-CM | POA: Diagnosis not present

## 2022-07-20 DIAGNOSIS — Z800 Family history of malignant neoplasm of digestive organs: Secondary | ICD-10-CM | POA: Diagnosis not present

## 2022-07-20 DIAGNOSIS — F419 Anxiety disorder, unspecified: Secondary | ICD-10-CM | POA: Diagnosis not present

## 2022-07-20 DIAGNOSIS — S52502A Unspecified fracture of the lower end of left radius, initial encounter for closed fracture: Secondary | ICD-10-CM | POA: Insufficient documentation

## 2022-07-20 DIAGNOSIS — R059 Cough, unspecified: Secondary | ICD-10-CM | POA: Diagnosis not present

## 2022-07-20 DIAGNOSIS — J45909 Unspecified asthma, uncomplicated: Secondary | ICD-10-CM | POA: Diagnosis not present

## 2022-07-20 DIAGNOSIS — K5792 Diverticulitis of intestine, part unspecified, without perforation or abscess without bleeding: Secondary | ICD-10-CM | POA: Diagnosis not present

## 2022-07-20 DIAGNOSIS — Z1152 Encounter for screening for COVID-19: Secondary | ICD-10-CM | POA: Diagnosis not present

## 2022-07-20 DIAGNOSIS — E875 Hyperkalemia: Secondary | ICD-10-CM | POA: Diagnosis present

## 2022-07-20 DIAGNOSIS — K219 Gastro-esophageal reflux disease without esophagitis: Secondary | ICD-10-CM | POA: Diagnosis present

## 2022-07-20 DIAGNOSIS — M79662 Pain in left lower leg: Secondary | ICD-10-CM | POA: Diagnosis not present

## 2022-07-20 DIAGNOSIS — R001 Bradycardia, unspecified: Secondary | ICD-10-CM | POA: Diagnosis not present

## 2022-07-20 DIAGNOSIS — R918 Other nonspecific abnormal finding of lung field: Secondary | ICD-10-CM | POA: Diagnosis not present

## 2022-07-20 DIAGNOSIS — G8911 Acute pain due to trauma: Secondary | ICD-10-CM | POA: Diagnosis not present

## 2022-07-20 DIAGNOSIS — Z7401 Bed confinement status: Secondary | ICD-10-CM | POA: Diagnosis not present

## 2022-07-20 DIAGNOSIS — Z833 Family history of diabetes mellitus: Secondary | ICD-10-CM | POA: Diagnosis not present

## 2022-07-20 DIAGNOSIS — K5900 Constipation, unspecified: Secondary | ICD-10-CM | POA: Diagnosis not present

## 2022-07-20 DIAGNOSIS — Z4789 Encounter for other orthopedic aftercare: Secondary | ICD-10-CM | POA: Diagnosis not present

## 2022-07-20 DIAGNOSIS — D469 Myelodysplastic syndrome, unspecified: Secondary | ICD-10-CM | POA: Diagnosis present

## 2022-07-20 DIAGNOSIS — W010XXA Fall on same level from slipping, tripping and stumbling without subsequent striking against object, initial encounter: Secondary | ICD-10-CM | POA: Insufficient documentation

## 2022-07-20 DIAGNOSIS — R0902 Hypoxemia: Secondary | ICD-10-CM | POA: Diagnosis not present

## 2022-07-20 DIAGNOSIS — N281 Cyst of kidney, acquired: Secondary | ICD-10-CM | POA: Diagnosis not present

## 2022-07-20 DIAGNOSIS — R110 Nausea: Secondary | ICD-10-CM | POA: Diagnosis not present

## 2022-07-20 DIAGNOSIS — I10 Essential (primary) hypertension: Secondary | ICD-10-CM | POA: Diagnosis not present

## 2022-07-20 DIAGNOSIS — S300XXA Contusion of lower back and pelvis, initial encounter: Secondary | ICD-10-CM | POA: Diagnosis present

## 2022-07-20 DIAGNOSIS — Z66 Do not resuscitate: Secondary | ICD-10-CM | POA: Diagnosis present

## 2022-07-20 DIAGNOSIS — M79602 Pain in left arm: Secondary | ICD-10-CM | POA: Diagnosis not present

## 2022-07-20 DIAGNOSIS — N1832 Chronic kidney disease, stage 3b: Secondary | ICD-10-CM | POA: Diagnosis present

## 2022-07-20 DIAGNOSIS — T7840XD Allergy, unspecified, subsequent encounter: Secondary | ICD-10-CM | POA: Diagnosis not present

## 2022-07-20 DIAGNOSIS — E440 Moderate protein-calorie malnutrition: Secondary | ICD-10-CM | POA: Diagnosis present

## 2022-07-20 DIAGNOSIS — M7989 Other specified soft tissue disorders: Secondary | ICD-10-CM | POA: Diagnosis not present

## 2022-07-20 DIAGNOSIS — Y92099 Unspecified place in other non-institutional residence as the place of occurrence of the external cause: Secondary | ICD-10-CM | POA: Diagnosis not present

## 2022-07-20 DIAGNOSIS — Z9049 Acquired absence of other specified parts of digestive tract: Secondary | ICD-10-CM | POA: Diagnosis not present

## 2022-07-20 DIAGNOSIS — K5730 Diverticulosis of large intestine without perforation or abscess without bleeding: Secondary | ICD-10-CM | POA: Diagnosis not present

## 2022-07-20 DIAGNOSIS — R531 Weakness: Secondary | ICD-10-CM | POA: Diagnosis not present

## 2022-07-20 DIAGNOSIS — R58 Hemorrhage, not elsewhere classified: Secondary | ICD-10-CM | POA: Diagnosis not present

## 2022-07-20 DIAGNOSIS — R42 Dizziness and giddiness: Secondary | ICD-10-CM | POA: Diagnosis not present

## 2022-07-20 DIAGNOSIS — Z820 Family history of epilepsy and other diseases of the nervous system: Secondary | ICD-10-CM | POA: Diagnosis not present

## 2022-07-20 DIAGNOSIS — S52592A Other fractures of lower end of left radius, initial encounter for closed fracture: Secondary | ICD-10-CM | POA: Diagnosis not present

## 2022-07-20 DIAGNOSIS — W19XXXD Unspecified fall, subsequent encounter: Secondary | ICD-10-CM | POA: Diagnosis not present

## 2022-07-20 DIAGNOSIS — Z743 Need for continuous supervision: Secondary | ICD-10-CM | POA: Diagnosis not present

## 2022-07-20 DIAGNOSIS — R000 Tachycardia, unspecified: Secondary | ICD-10-CM | POA: Diagnosis not present

## 2022-07-20 DIAGNOSIS — S6990XA Unspecified injury of unspecified wrist, hand and finger(s), initial encounter: Secondary | ICD-10-CM | POA: Diagnosis not present

## 2022-07-20 DIAGNOSIS — S82402D Unspecified fracture of shaft of left fibula, subsequent encounter for closed fracture with routine healing: Secondary | ICD-10-CM | POA: Diagnosis not present

## 2022-07-20 DIAGNOSIS — N179 Acute kidney failure, unspecified: Secondary | ICD-10-CM | POA: Diagnosis not present

## 2022-07-20 DIAGNOSIS — M810 Age-related osteoporosis without current pathological fracture: Secondary | ICD-10-CM | POA: Diagnosis present

## 2022-07-20 DIAGNOSIS — Z681 Body mass index (BMI) 19 or less, adult: Secondary | ICD-10-CM | POA: Diagnosis not present

## 2022-07-20 DIAGNOSIS — R609 Edema, unspecified: Secondary | ICD-10-CM | POA: Diagnosis not present

## 2022-07-20 DIAGNOSIS — N200 Calculus of kidney: Secondary | ICD-10-CM | POA: Diagnosis not present

## 2022-07-20 DIAGNOSIS — D62 Acute posthemorrhagic anemia: Secondary | ICD-10-CM | POA: Diagnosis present

## 2022-07-20 DIAGNOSIS — Z8249 Family history of ischemic heart disease and other diseases of the circulatory system: Secondary | ICD-10-CM | POA: Diagnosis not present

## 2022-07-20 DIAGNOSIS — S52352A Displaced comminuted fracture of shaft of radius, left arm, initial encounter for closed fracture: Secondary | ICD-10-CM | POA: Diagnosis not present

## 2022-07-20 DIAGNOSIS — S52532A Colles' fracture of left radius, initial encounter for closed fracture: Secondary | ICD-10-CM | POA: Diagnosis not present

## 2022-07-20 DIAGNOSIS — T148XXA Other injury of unspecified body region, initial encounter: Secondary | ICD-10-CM

## 2022-07-20 DIAGNOSIS — D649 Anemia, unspecified: Secondary | ICD-10-CM | POA: Diagnosis not present

## 2022-07-20 DIAGNOSIS — F32A Depression, unspecified: Secondary | ICD-10-CM | POA: Diagnosis present

## 2022-07-20 DIAGNOSIS — M25572 Pain in left ankle and joints of left foot: Secondary | ICD-10-CM | POA: Diagnosis not present

## 2022-07-20 LAB — CBC WITH DIFFERENTIAL/PLATELET
Abs Immature Granulocytes: 0.07 10*3/uL (ref 0.00–0.07)
Basophils Absolute: 0 10*3/uL (ref 0.0–0.1)
Basophils Relative: 1 %
Eosinophils Absolute: 0 10*3/uL (ref 0.0–0.5)
Eosinophils Relative: 0 %
HCT: 24 % — ABNORMAL LOW (ref 36.0–46.0)
Hemoglobin: 7.3 g/dL — ABNORMAL LOW (ref 12.0–15.0)
Immature Granulocytes: 4 %
Lymphocytes Relative: 35 %
Lymphs Abs: 0.7 10*3/uL (ref 0.7–4.0)
MCH: 32.3 pg (ref 26.0–34.0)
MCHC: 30.4 g/dL (ref 30.0–36.0)
MCV: 106.2 fL — ABNORMAL HIGH (ref 80.0–100.0)
Monocytes Absolute: 0.6 10*3/uL (ref 0.1–1.0)
Monocytes Relative: 30 %
Neutro Abs: 0.6 10*3/uL — ABNORMAL LOW (ref 1.7–7.7)
Neutrophils Relative %: 30 %
Platelets: 74 10*3/uL — ABNORMAL LOW (ref 150–400)
RBC: 2.26 MIL/uL — ABNORMAL LOW (ref 3.87–5.11)
RDW: 20.4 % — ABNORMAL HIGH (ref 11.5–15.5)
WBC: 1.9 10*3/uL — ABNORMAL LOW (ref 4.0–10.5)
nRBC: 1 % — ABNORMAL HIGH (ref 0.0–0.2)

## 2022-07-20 LAB — BASIC METABOLIC PANEL
Anion gap: 7 (ref 5–15)
BUN: 30 mg/dL — ABNORMAL HIGH (ref 8–23)
CO2: 20 mmol/L — ABNORMAL LOW (ref 22–32)
Calcium: 8.4 mg/dL — ABNORMAL LOW (ref 8.9–10.3)
Chloride: 106 mmol/L (ref 98–111)
Creatinine, Ser: 1.3 mg/dL — ABNORMAL HIGH (ref 0.44–1.00)
GFR, Estimated: 39 mL/min — ABNORMAL LOW (ref >60)
Glucose, Bld: 146 mg/dL — ABNORMAL HIGH (ref 70–99)
Potassium: 4.8 mmol/L (ref 3.5–5.1)
Sodium: 133 mmol/L — ABNORMAL LOW (ref 135–145)

## 2022-07-20 MED ORDER — HYDROMORPHONE HCL 1 MG/ML IJ SOLN
0.5000 mg | Freq: Once | INTRAMUSCULAR | Status: AC
  Administered 2022-07-20: 0.5 mg via INTRAVENOUS
  Filled 2022-07-20: qty 1

## 2022-07-20 MED ORDER — TRAMADOL HCL 50 MG PO TABS
50.0000 mg | ORAL_TABLET | Freq: Once | ORAL | Status: AC
  Administered 2022-07-20: 50 mg via ORAL
  Filled 2022-07-20: qty 1

## 2022-07-20 MED ORDER — CEFAZOLIN SODIUM-DEXTROSE 2-4 GM/100ML-% IV SOLN
2.0000 g | Freq: Once | INTRAVENOUS | Status: AC
  Administered 2022-07-20: 2 g via INTRAVENOUS
  Filled 2022-07-20: qty 100

## 2022-07-20 MED ORDER — TETANUS-DIPHTH-ACELL PERTUSSIS 5-2.5-18.5 LF-MCG/0.5 IM SUSY
0.5000 mL | PREFILLED_SYRINGE | Freq: Once | INTRAMUSCULAR | Status: AC
  Administered 2022-07-20: 0.5 mL via INTRAMUSCULAR
  Filled 2022-07-20: qty 0.5

## 2022-07-20 MED ORDER — LIDOCAINE HCL (PF) 1 % IJ SOLN
10.0000 mL | Freq: Once | INTRAMUSCULAR | Status: AC
  Administered 2022-07-20: 10 mL
  Filled 2022-07-20: qty 30

## 2022-07-20 MED ORDER — TRAMADOL HCL 50 MG PO TABS: 50.0000 mg | ORAL_TABLET | Freq: Four times a day (QID) | ORAL | 0 refills | Status: DC | PRN

## 2022-07-20 MED ORDER — HYDROCODONE-ACETAMINOPHEN 5-325 MG PO TABS
1.0000 | ORAL_TABLET | Freq: Once | ORAL | Status: AC
  Administered 2022-07-20: 1 via ORAL
  Filled 2022-07-20: qty 1

## 2022-07-20 NOTE — ED Triage Notes (Signed)
Pt BIB [facility] from [place] for a mechanical fall. Pt was sleeping in a chair, when she stood up, she twisted her left ankle and fell, also injuring her left wrist. Pt received 100 mcg fentanyl IV PTA

## 2022-07-20 NOTE — Discharge Instructions (Addendum)
You will need your sutures removed in 7 to 10 days.  And make sure you keep your splint clean and dry at all times to avoid it from getting wet.  If you are not able to feel your fingers, they start turning blue or white, or you start having severe pain please return to the ER.  Follow-up with with hand surgery.  Call and make an appointment.  Also Dr. Greta Doom the hand surgeon is in [facility] group and they will be able to also take care of the ankle fracture.  Both of these fractures will take several weeks to heal.  Can take extra strength Tylenol and the prescribed tramadol as needed for pain control.  It is possible the extra strength Tylenol will be more than enough.

## 2022-07-20 NOTE — ED Notes (Signed)
Patient transported to x-ray. ?

## 2022-07-20 NOTE — Progress Notes (Signed)
Orthopedic Tech Progress Note Patient Details:  Alison Bridges 05/14/1929 [id number]  Ortho Devices Type of Ortho Device: Sugartong splint, CAM walker Ortho Device/Splint Location: [facility] Ortho Device/Splint Interventions: Application   Post Interventions Patient Tolerated: Well  Alison Bridges Alison Bridges 07/20/2022, 7:05 PM

## 2022-07-20 NOTE — ED Provider Notes (Signed)
[facility] DEPT Provider Note   CSN: [id number] Arrival date & time: 07/20/22  1552     History  Chief Complaint  Patient presents with   Alison Bridges    Alison Bridges is a 88 y.o. female, history of MDS, who presents to the ED secondary to a fall that occurred at her independent living facility today.  She states she tripped on something, and complains of left wrist, left ankle pain.  Was able to get herself up, and EMS was called.  She is on a blood thinners, and denies hitting her head.  Denies any chest abdomen, head pain.  No pelvic pain either.  Denies any recent illnesses.  Otherwise well-appearing just in pain from her left ankle and wrist.     Home Medications Prior to Admission medications   Medication Sig Start Date End Date Taking? Authorizing Provider  Ascorbic Acid (VITAMIN C PO) Take 1 tablet by mouth daily.    [provider]  Azelastine HCl 137 MCG/SPRAY SOLN Place 1 spray into both nostrils daily as needed (allergies). 05/09/21   [provider]  b complex vitamins capsule Take 1 capsule by mouth daily.    [provider]  cholecalciferol (VITAMIN D) 1000 UNITS tablet Take 1,000 Units by mouth daily.    [provider]  Darbepoetin Alfa (ARANESP, ALBUMIN FREE,) 150 MCG/0.3ML SOSY injection Inject 150 mcg into the skin every 30 (thirty) days. 12-19-21 last dose given    [provider]  levothyroxine (SYNTHROID, LEVOTHROID) 125 MCG tablet Take 125 mcg by mouth daily before breakfast.    [provider]  nystatin (MYCOSTATIN) 100000 UNIT/ML suspension Take 5 mLs (500,000 Units total) by mouth 4 (four) times daily. Swish in the mouth for 1-2 mins then swallow 07/17/22   [name], MD  OVER THE COUNTER MEDICATION Take 1 capsule by mouth daily. Blood Builder    [provider]  oxybutynin (DITROPAN) 5 MG tablet Take 1 tablet (5 mg total) by mouth every 8 (eight) hours as needed  for bladder spasms. Patient not taking: Reported on 06/04/2022 05/08/22   [name], MD  pantoprazole (PROTONIX) 40 MG tablet Take 40 mg by mouth daily as needed (acid reflux).    [provider]  phenazopyridine (PYRIDIUM) 200 MG tablet Take 1 tablet (200 mg total) by mouth 3 (three) times daily as needed (for pain with urination). 05/08/22 05/08/23  [name], MD  sertraline (ZOLOFT) 100 MG tablet Take 100 mg by mouth daily. 03/13/20   [provider]      Allergies    Patient has no known allergies.    Review of Systems   Review of Systems  Respiratory:  Negative for shortness of breath.   Cardiovascular:  Negative for chest pain.  Skin:  Positive for wound.  Neurological:  Negative for dizziness.    Physical Exam Updated Vital Signs BP 112/63   Pulse 64   Temp 97.8 F (36.6 C) (Oral)   Resp 17   SpO2 96%  Physical Exam Vitals and nursing note reviewed.  Constitutional:      General: She is not in acute distress.    Appearance: She is well-developed.  HENT:     Head: Normocephalic and atraumatic.  Eyes:     Conjunctiva/sclera: Conjunctivae normal.  Cardiovascular:     Rate and Rhythm: Normal rate and regular rhythm.     Heart sounds: No murmur heard. Pulmonary:     Effort:  Pulmonary effort is normal. No respiratory distress.     Breath sounds: Normal breath sounds.  Abdominal:     Palpations: Abdomen is soft.     Tenderness: There is no abdominal tenderness.  Musculoskeletal:        General: No swelling.     Cervical back: Neck supple. No tenderness.     Comments: Left hand: TTP of ulnar wrist. Radial pulses present. Grip strength intact. Able to flex, extend, ulnar and radial deviate wrist. Two point discrimination intact. Normal thumb opposition. Intact ROM for all MCPs, PIPs, and DIPs.  No snuffbox ttp. No sensory deficits--perceived numbness. Capillary refill <2sec  Left ankle: TTP of lateral malleolus. Edema noted  to lateral malleolus w/extensive bruising. Able to bear weight. Able to plantar flex and dorsiflex ankle. Inversion/eversion intact. Negative Thompson test. No midfoot tor base of 5th metatarsal tenderness to palpation. Capillary refill <2sec. Dorsalis pedis pulse present. No foot drop noted. Sensation intact. Warm to touch.     Skin:    General: Skin is warm and dry.     Capillary Refill: Capillary refill takes less than 2 seconds.     Comments: 2x5 large avulsion to ulnar aspect of left wrist  Neurological:     Mental Status: She is alert.  Psychiatric:        Mood and Affect: Mood normal.     ED Results / Procedures / Treatments   Labs (all labs ordered are listed, but only abnormal results are displayed) Labs Reviewed  CBC WITH DIFFERENTIAL/PLATELET - Abnormal; Notable for the following components:      Result Value   WBC 1.9 (*)    RBC 2.26 (*)    Hemoglobin 7.3 (*)    HCT 24.0 (*)    MCV 106.2 (*)    RDW 20.4 (*)    Platelets 74 (*)    nRBC 1.0 (*)    Neutro Abs 0.6 (*)    All other components within normal limits  BASIC METABOLIC PANEL - Abnormal; Notable for the following components:   Sodium 133 (*)    CO2 20 (*)    Glucose, Bld 146 (*)    BUN 30 (*)    Creatinine, Ser 1.30 (*)    Calcium 8.4 (*)    GFR, Estimated 39 (*)    All other components within normal limits    EKG None  Radiology DG Hand Complete Left  Result Date: 07/20/2022 CLINICAL DATA:  Fall on outstretched hand. EXAM: LEFT HAND - COMPLETE 3+ VIEW COMPARISON:  None Available. FINDINGS: A comminuted angulated displaced fracture of the distal radius extending into the radiocarpal joint is again identified. And ulnar styloid fractures again identified. Carpal bones are intact. Degenerative changes are identified between the base of the first metacarpal and trapezium. Degenerative changes are identified throughout the interphalangeal joints most prominent in the thumb. No fractures are identified in  the hand. IMPRESSION: 1. Comminuted angulated displaced fracture of the distal radius extending into the radiocarpal joint. Ulnar styloid fracture. 2. Degenerative changes as above. 3. No fractures in the hand. Electronically Signed   By: Dorise Bullion III M.D.   On: 07/20/2022 16:50   DG Wrist Complete Left  Result Date: 07/20/2022 CLINICAL DATA:  Pain after fall EXAM: LEFT WRIST - COMPLETE 3+ VIEW COMPARISON:  None Available. FINDINGS: There is a displaced and angulated comminuted fracture through the distal radius. There appears to be extension into the radiocarpal joint. There is an ulnar styloid fracture. No wrist  dislocation. No other abnormalities. IMPRESSION: 1. Displaced and angulated comminuted fracture through the distal radius. There appears to be extension into the radiocarpal joint. 2. Ulnar styloid fracture. Electronically Signed   By: Dorise Bullion III M.D.   On: 07/20/2022 16:48   DG Foot Complete Left  Result Date: 07/20/2022 CLINICAL DATA:  Fall.  Swelling. EXAM: LEFT FOOT - COMPLETE 3+ VIEW COMPARISON:  None Available. FINDINGS: There is a hallux valgus deformity. No fractures identified in the left foot. IMPRESSION: No fractures identified in the left foot. No acute abnormality in the left foot. Lateral malleolar soft tissue swelling with a distal fibular tip fracture better appreciated on the ankle x-rays. Electronically Signed   By: Dorise Bullion III M.D.   On: 07/20/2022 16:47   DG Ankle Complete Left  Result Date: 07/20/2022 CLINICAL DATA:  Pain after twisting ankle EXAM: LEFT ANKLE COMPLETE - 3+ VIEW COMPARISON:  None Available. FINDINGS: There is a fracture through the distal tip of the fibula with overlying soft tissue swelling. The ankle mortise is intact. No other fractures are identified. No other acute abnormalities. IMPRESSION: Fracture through the distal tip of the fibula with overlying soft tissue swelling. Electronically Signed   By: Dorise Bullion III M.D.    On: 07/20/2022 16:45    Procedures .Marland KitchenLaceration Repair  Date/Time: 07/20/2022 5:48 PM  Performed by: Osvaldo Shipper, PA Authorized by: Osvaldo Shipper, PA   Consent:    Consent obtained:  Verbal   Risks discussed:  Infection, pain, need for additional repair, poor cosmetic result, tendon damage, nerve damage, poor wound healing and vascular damage   Alternatives discussed:  Delayed treatment and referral Universal protocol:    Patient identity confirmed:  Verbally with patient Anesthesia:    Anesthesia method:  Local infiltration   Local anesthetic:  Lidocaine 1% w/o epi Laceration details:    Location:  Hand   Hand location:  L wrist   Length (cm):  5 (5x2cm avulsion) Pre-procedure details:    Preparation:  Patient was prepped and draped in usual sterile fashion Treatment:    Area cleansed with:  Povidone-iodine and saline   Amount of cleaning:  Standard   Irrigation solution:  Sterile saline   Irrigation method:  Pressure wash   Debridement:  None   Undermining:  None Skin repair:    Repair method:  Sutures   Suture size:  3-0   Suture material:  Prolene   Suture technique:  Simple interrupted   Number of sutures:  16 Approximation:    Approximation:  Close Repair type:    Repair type:  Intermediate Post-procedure details:    Dressing:  Non-adherent dressing   Procedure completion:  Tolerated     Medications Ordered in ED Medications  ceFAZolin (ANCEF) IVPB 2g/100 mL premix (2 g Intravenous New Bag/Given 07/20/22 1805)  HYDROcodone-acetaminophen (NORCO/VICODIN) 5-325 MG per tablet 1 tablet (has no administration in time range)  lidocaine (PF) (XYLOCAINE) 1 % injection 10 mL (10 mLs Infiltration Given by Other 07/20/22 1653)  HYDROmorphone (DILAUDID) injection 0.5 mg (0.5 mg Intravenous Given 07/20/22 1652)  Tdap (BOOSTRIX) injection 0.5 mL (0.5 mLs Intramuscular Given 07/20/22 1653)    ED Course/ Medical Decision Making/ A&P                            Medical Decision Making Patient is a 88 year old female, here for fall today after tripping, has left wrist pain, left ankle pain.  Will obtain x-rays, and basic labs to evaluate her myelodysplastic syndrome.  Amount and/or Complexity of Data Reviewed Labs: ordered.    Details: Labs fairly unremarkable, does appear to be baseline for patient. Radiology: ordered.    Details: Displaced and angulated fracture of the distal radius, is comminuted, extension into the radiocarpal joint.  In the distal fibula fracture. Discussion of management or test interpretation with external provider(s): Patient's wound was extensive then repaired with 16 sutures, she is found to have a fracture, sugar-tong was ordered for this.  This case was discussed with Dr. Greta Doom, who did not recommend surgery at this time, he states follow-up in office early next week.  Will place her in a cam walking boot for her distal fibula fracture.  Tetanus updated secondary to open wound, need for coverage.  And likely discharge on Keflex.  Pending disposition after nursing speaks with facility for further care escalation.  Signed out to Dr. Rogene Houston.    Risk Prescription drug management.    Final Clinical Impression(s) / ED Diagnoses Final diagnoses:  Fall, initial encounter  Other closed fracture of distal end of left radius, initial encounter  Avulsion of skin  Closed fracture of distal end of left fibula, unspecified fracture morphology, initial encounter    Rx / DC Orders ED Discharge Orders     None         Ostin Mathey, Si Gaul, PA 07/20/22 1819    Fredia Sorrow, MD 07/20/22 1912

## 2022-07-20 NOTE — ED Notes (Signed)
[facility] notified of need for pt transport 

## 2022-07-21 ENCOUNTER — Encounter (HOSPITAL_COMMUNITY): Payer: Self-pay

## 2022-07-21 ENCOUNTER — Emergency Department (HOSPITAL_COMMUNITY): Payer: Medicare Other

## 2022-07-21 ENCOUNTER — Other Ambulatory Visit: Payer: Self-pay

## 2022-07-21 ENCOUNTER — Inpatient Hospital Stay (HOSPITAL_COMMUNITY)
Admission: EM | Admit: 2022-07-21 | Discharge: 2022-07-27 | DRG: 812 | Disposition: A | Discharge status: Skilled Nursing Facility | Payer: Medicare Other | Source: Skilled Nursing Facility | Attending: Internal Medicine | Admitting: Internal Medicine

## 2022-07-21 DIAGNOSIS — S300XXA Contusion of lower back and pelvis, initial encounter: Secondary | ICD-10-CM | POA: Diagnosis present

## 2022-07-21 DIAGNOSIS — W010XXA Fall on same level from slipping, tripping and stumbling without subsequent striking against object, initial encounter: Secondary | ICD-10-CM | POA: Diagnosis present

## 2022-07-21 DIAGNOSIS — Z833 Family history of diabetes mellitus: Secondary | ICD-10-CM

## 2022-07-21 DIAGNOSIS — F32A Depression, unspecified: Secondary | ICD-10-CM | POA: Diagnosis present

## 2022-07-21 DIAGNOSIS — S52592A Other fractures of lower end of left radius, initial encounter for closed fracture: Secondary | ICD-10-CM | POA: Diagnosis present

## 2022-07-21 DIAGNOSIS — N1832 Chronic kidney disease, stage 3b: Secondary | ICD-10-CM | POA: Diagnosis present

## 2022-07-21 DIAGNOSIS — K5730 Diverticulosis of large intestine without perforation or abscess without bleeding: Secondary | ICD-10-CM | POA: Diagnosis not present

## 2022-07-21 DIAGNOSIS — M79662 Pain in left lower leg: Secondary | ICD-10-CM | POA: Diagnosis not present

## 2022-07-21 DIAGNOSIS — E875 Hyperkalemia: Secondary | ICD-10-CM | POA: Diagnosis present

## 2022-07-21 DIAGNOSIS — Z79890 Hormone replacement therapy (postmenopausal): Secondary | ICD-10-CM

## 2022-07-21 DIAGNOSIS — Y92099 Unspecified place in other non-institutional residence as the place of occurrence of the external cause: Secondary | ICD-10-CM

## 2022-07-21 DIAGNOSIS — Z9841 Cataract extraction status, right eye: Secondary | ICD-10-CM

## 2022-07-21 DIAGNOSIS — G8911 Acute pain due to trauma: Secondary | ICD-10-CM | POA: Diagnosis not present

## 2022-07-21 DIAGNOSIS — Z9049 Acquired absence of other specified parts of digestive tract: Secondary | ICD-10-CM | POA: Diagnosis not present

## 2022-07-21 DIAGNOSIS — Z800 Family history of malignant neoplasm of digestive organs: Secondary | ICD-10-CM

## 2022-07-21 DIAGNOSIS — M810 Age-related osteoporosis without current pathological fracture: Secondary | ICD-10-CM | POA: Diagnosis present

## 2022-07-21 DIAGNOSIS — N281 Cyst of kidney, acquired: Secondary | ICD-10-CM | POA: Diagnosis not present

## 2022-07-21 DIAGNOSIS — S61512A Laceration without foreign body of left wrist, initial encounter: Secondary | ICD-10-CM | POA: Diagnosis present

## 2022-07-21 DIAGNOSIS — Z23 Encounter for immunization: Secondary | ICD-10-CM | POA: Diagnosis present

## 2022-07-21 DIAGNOSIS — D62 Acute posthemorrhagic anemia: Principal | ICD-10-CM | POA: Diagnosis present

## 2022-07-21 DIAGNOSIS — D649 Anemia, unspecified: Secondary | ICD-10-CM | POA: Diagnosis not present

## 2022-07-21 DIAGNOSIS — S6990XA Unspecified injury of unspecified wrist, hand and finger(s), initial encounter: Secondary | ICD-10-CM | POA: Diagnosis not present

## 2022-07-21 DIAGNOSIS — W19XXXD Unspecified fall, subsequent encounter: Secondary | ICD-10-CM | POA: Diagnosis not present

## 2022-07-21 DIAGNOSIS — Z66 Do not resuscitate: Secondary | ICD-10-CM | POA: Diagnosis present

## 2022-07-21 DIAGNOSIS — K5900 Constipation, unspecified: Secondary | ICD-10-CM | POA: Diagnosis not present

## 2022-07-21 DIAGNOSIS — N179 Acute kidney failure, unspecified: Secondary | ICD-10-CM | POA: Diagnosis present

## 2022-07-21 DIAGNOSIS — S82832A Other fracture of upper and lower end of left fibula, initial encounter for closed fracture: Secondary | ICD-10-CM | POA: Diagnosis present

## 2022-07-21 DIAGNOSIS — M79602 Pain in left arm: Secondary | ICD-10-CM | POA: Diagnosis not present

## 2022-07-21 DIAGNOSIS — S42302D Unspecified fracture of shaft of humerus, left arm, subsequent encounter for fracture with routine healing: Secondary | ICD-10-CM | POA: Diagnosis not present

## 2022-07-21 DIAGNOSIS — E440 Moderate protein-calorie malnutrition: Secondary | ICD-10-CM | POA: Diagnosis present

## 2022-07-21 DIAGNOSIS — K219 Gastro-esophageal reflux disease without esophagitis: Secondary | ICD-10-CM | POA: Diagnosis present

## 2022-07-21 DIAGNOSIS — R531 Weakness: Secondary | ICD-10-CM

## 2022-07-21 DIAGNOSIS — J45909 Unspecified asthma, uncomplicated: Secondary | ICD-10-CM | POA: Diagnosis not present

## 2022-07-21 DIAGNOSIS — R059 Cough, unspecified: Secondary | ICD-10-CM | POA: Diagnosis not present

## 2022-07-21 DIAGNOSIS — D61818 Other pancytopenia: Secondary | ICD-10-CM | POA: Diagnosis present

## 2022-07-21 DIAGNOSIS — Z90710 Acquired absence of both cervix and uterus: Secondary | ICD-10-CM

## 2022-07-21 DIAGNOSIS — Z79899 Other long term (current) drug therapy: Secondary | ICD-10-CM

## 2022-07-21 DIAGNOSIS — D469 Myelodysplastic syndrome, unspecified: Secondary | ICD-10-CM | POA: Diagnosis present

## 2022-07-21 DIAGNOSIS — Z8249 Family history of ischemic heart disease and other diseases of the circulatory system: Secondary | ICD-10-CM | POA: Diagnosis not present

## 2022-07-21 DIAGNOSIS — T7840XD Allergy, unspecified, subsequent encounter: Secondary | ICD-10-CM | POA: Diagnosis not present

## 2022-07-21 DIAGNOSIS — Z820 Family history of epilepsy and other diseases of the nervous system: Secondary | ICD-10-CM | POA: Diagnosis not present

## 2022-07-21 DIAGNOSIS — K5792 Diverticulitis of intestine, part unspecified, without perforation or abscess without bleeding: Secondary | ICD-10-CM | POA: Diagnosis not present

## 2022-07-21 DIAGNOSIS — R42 Dizziness and giddiness: Secondary | ICD-10-CM | POA: Diagnosis not present

## 2022-07-21 DIAGNOSIS — S82202D Unspecified fracture of shaft of left tibia, subsequent encounter for closed fracture with routine healing: Secondary | ICD-10-CM | POA: Diagnosis not present

## 2022-07-21 DIAGNOSIS — Z9842 Cataract extraction status, left eye: Secondary | ICD-10-CM

## 2022-07-21 DIAGNOSIS — R0902 Hypoxemia: Secondary | ICD-10-CM | POA: Diagnosis not present

## 2022-07-21 DIAGNOSIS — Z1152 Encounter for screening for COVID-19: Secondary | ICD-10-CM

## 2022-07-21 DIAGNOSIS — N200 Calculus of kidney: Secondary | ICD-10-CM | POA: Diagnosis not present

## 2022-07-21 DIAGNOSIS — Z7401 Bed confinement status: Secondary | ICD-10-CM | POA: Diagnosis not present

## 2022-07-21 DIAGNOSIS — I10 Essential (primary) hypertension: Secondary | ICD-10-CM | POA: Diagnosis not present

## 2022-07-21 DIAGNOSIS — R110 Nausea: Secondary | ICD-10-CM | POA: Diagnosis not present

## 2022-07-21 DIAGNOSIS — R918 Other nonspecific abnormal finding of lung field: Secondary | ICD-10-CM | POA: Diagnosis not present

## 2022-07-21 DIAGNOSIS — Z681 Body mass index (BMI) 19 or less, adult: Secondary | ICD-10-CM

## 2022-07-21 DIAGNOSIS — Z4789 Encounter for other orthopedic aftercare: Secondary | ICD-10-CM | POA: Diagnosis not present

## 2022-07-21 DIAGNOSIS — E039 Hypothyroidism, unspecified: Secondary | ICD-10-CM | POA: Diagnosis present

## 2022-07-21 DIAGNOSIS — F419 Anxiety disorder, unspecified: Secondary | ICD-10-CM | POA: Diagnosis not present

## 2022-07-21 DIAGNOSIS — S82402D Unspecified fracture of shaft of left fibula, subsequent encounter for closed fracture with routine healing: Secondary | ICD-10-CM | POA: Diagnosis not present

## 2022-07-21 DIAGNOSIS — Z7951 Long term (current) use of inhaled steroids: Secondary | ICD-10-CM

## 2022-07-21 DIAGNOSIS — Z961 Presence of intraocular lens: Secondary | ICD-10-CM | POA: Diagnosis present

## 2022-07-21 LAB — CBC WITH DIFFERENTIAL/PLATELET
Abs Immature Granulocytes: 0.1 10*3/uL — ABNORMAL HIGH (ref 0.00–0.07)
Abs Immature Granulocytes: 0.06 10*3/uL (ref 0.00–0.07)
Basophils Absolute: 0 10*3/uL (ref 0.0–0.1)
Basophils Absolute: 0 10*3/uL (ref 0.0–0.1)
Basophils Relative: 0 %
Basophils Relative: 0 %
Eosinophils Absolute: 0 10*3/uL (ref 0.0–0.5)
Eosinophils Absolute: 0 10*3/uL (ref 0.0–0.5)
Eosinophils Relative: 0 %
Eosinophils Relative: 0 %
HCT: 20.8 % — ABNORMAL LOW (ref 36.0–46.0)
HCT: 17.1 % — ABNORMAL LOW (ref 36.0–46.0)
Hemoglobin: 6.4 g/dL — CL (ref 12.0–15.0)
Hemoglobin: 5.3 g/dL — CL (ref 12.0–15.0)
Immature Granulocytes: 1 %
Lymphocytes Relative: 21 %
Lymphocytes Relative: 26 %
Lymphs Abs: 1.2 10*3/uL (ref 0.7–4.0)
Lymphs Abs: 1.2 10*3/uL (ref 0.7–4.0)
MCH: 33.2 pg (ref 26.0–34.0)
MCH: 33.5 pg (ref 26.0–34.0)
MCHC: 30.8 g/dL (ref 30.0–36.0)
MCHC: 31 g/dL (ref 30.0–36.0)
MCV: 107.8 fL — ABNORMAL HIGH (ref 80.0–100.0)
MCV: 108.2 fL — ABNORMAL HIGH (ref 80.0–100.0)
Metamyelocytes Relative: 1 %
Monocytes Absolute: 2.3 10*3/uL — ABNORMAL HIGH (ref 0.1–1.0)
Monocytes Absolute: 1.7 10*3/uL — ABNORMAL HIGH (ref 0.1–1.0)
Monocytes Relative: 40 %
Monocytes Relative: 36 %
Myelocytes: 1 %
Neutro Abs: 2.1 10*3/uL (ref 1.7–7.7)
Neutro Abs: 1.8 10*3/uL (ref 1.7–7.7)
Neutrophils Relative %: 37 %
Neutrophils Relative %: 37 %
Platelets: 74 10*3/uL — ABNORMAL LOW (ref 150–400)
Platelets: 67 10*3/uL — ABNORMAL LOW (ref 150–400)
RBC: 1.93 MIL/uL — ABNORMAL LOW (ref 3.87–5.11)
RBC: 1.58 MIL/uL — ABNORMAL LOW (ref 3.87–5.11)
RDW: 20.9 % — ABNORMAL HIGH (ref 11.5–15.5)
RDW: 21 % — ABNORMAL HIGH (ref 11.5–15.5)
WBC: 5.8 10*3/uL (ref 4.0–10.5)
WBC: 4.8 10*3/uL (ref 4.0–10.5)
nRBC: 0.7 % — ABNORMAL HIGH (ref 0.0–0.2)
nRBC: 0.8 % — ABNORMAL HIGH (ref 0.0–0.2)

## 2022-07-21 LAB — URINALYSIS, ROUTINE W REFLEX MICROSCOPIC
Bacteria, UA: NONE SEEN
Bilirubin Urine: NEGATIVE
Glucose, UA: NEGATIVE mg/dL
Hgb urine dipstick: NEGATIVE
Ketones, ur: NEGATIVE mg/dL
Leukocytes,Ua: NEGATIVE
Nitrite: NEGATIVE
Protein, ur: 100 mg/dL — AB
Specific Gravity, Urine: 1.016 (ref 1.005–1.030)
pH: 5 (ref 5.0–8.0)

## 2022-07-21 LAB — COMPREHENSIVE METABOLIC PANEL WITH GFR
ALT: 19 U/L (ref 0–44)
AST: 37 U/L (ref 15–41)
Albumin: 3.6 g/dL (ref 3.5–5.0)
Alkaline Phosphatase: 64 U/L (ref 38–126)
Anion gap: 14 (ref 5–15)
BUN: 46 mg/dL — ABNORMAL HIGH (ref 8–23)
CO2: 17 mmol/L — ABNORMAL LOW (ref 22–32)
Calcium: 8.7 mg/dL — ABNORMAL LOW (ref 8.9–10.3)
Chloride: 98 mmol/L (ref 98–111)
Creatinine, Ser: 2.49 mg/dL — ABNORMAL HIGH (ref 0.44–1.00)
GFR, Estimated: 18 mL/min — ABNORMAL LOW
Glucose, Bld: 130 mg/dL — ABNORMAL HIGH (ref 70–99)
Potassium: 5.9 mmol/L — ABNORMAL HIGH (ref 3.5–5.1)
Sodium: 129 mmol/L — ABNORMAL LOW (ref 135–145)
Total Bilirubin: 0.5 mg/dL (ref 0.3–1.2)
Total Protein: 6.8 g/dL (ref 6.5–8.1)

## 2022-07-21 LAB — BASIC METABOLIC PANEL
Anion gap: 10 (ref 5–15)
BUN: 48 mg/dL — ABNORMAL HIGH (ref 8–23)
CO2: 18 mmol/L — ABNORMAL LOW (ref 22–32)
Calcium: 8.5 mg/dL — ABNORMAL LOW (ref 8.9–10.3)
Chloride: 102 mmol/L (ref 98–111)
Creatinine, Ser: 2.44 mg/dL — ABNORMAL HIGH (ref 0.44–1.00)
GFR, Estimated: 18 mL/min — ABNORMAL LOW (ref >60)
Glucose, Bld: 127 mg/dL — ABNORMAL HIGH (ref 70–99)
Potassium: 5.6 mmol/L — ABNORMAL HIGH (ref 3.5–5.1)
Sodium: 130 mmol/L — ABNORMAL LOW (ref 135–145)

## 2022-07-21 LAB — PREPARE RBC (CROSSMATCH)

## 2022-07-21 LAB — RESP PANEL BY RT-PCR (RSV, FLU A&B, COVID)  RVPGX2
Influenza A by PCR: NEGATIVE
Influenza B by PCR: NEGATIVE
Resp Syncytial Virus by PCR: NEGATIVE
SARS Coronavirus 2 by RT PCR: NEGATIVE

## 2022-07-21 LAB — MAGNESIUM: Magnesium: 2.1 mg/dL (ref 1.7–2.4)

## 2022-07-21 MED ORDER — SODIUM CHLORIDE 0.9% IV SOLUTION: Freq: Once | INTRAVENOUS | Status: DC

## 2022-07-21 MED ORDER — HYDROCODONE-ACETAMINOPHEN 5-325 MG PO TABS
1.0000 | ORAL_TABLET | Freq: Once | ORAL | Status: AC
  Administered 2022-07-21: 1 via ORAL
  Filled 2022-07-21: qty 1

## 2022-07-21 MED ORDER — SODIUM ZIRCONIUM CYCLOSILICATE 10 G PO PACK
10.0000 g | PACK | Freq: Once | ORAL | Status: AC
  Administered 2022-07-21: 10 g via ORAL
  Filled 2022-07-21: qty 1

## 2022-07-21 MED ORDER — CEPHALEXIN 500 MG PO CAPS
500.0000 mg | ORAL_CAPSULE | Freq: Two times a day (BID) | ORAL | Status: DC
  Administered 2022-07-21 – 2022-07-27 (×12): 500 mg via ORAL
  Filled 2022-07-21 (×12): qty 1

## 2022-07-21 MED ORDER — ONDANSETRON HCL 4 MG PO TABS: 4.0000 mg | ORAL_TABLET | Freq: Four times a day (QID) | ORAL | Status: DC | PRN

## 2022-07-21 MED ORDER — ACETAMINOPHEN 650 MG RE SUPP: 650.0000 mg | Freq: Four times a day (QID) | RECTAL | Status: DC | PRN

## 2022-07-21 MED ORDER — MORPHINE SULFATE (PF) 2 MG/ML IV SOLN
2.0000 mg | Freq: Once | INTRAVENOUS | Status: AC
  Administered 2022-07-21: 2 mg via INTRAVENOUS
  Filled 2022-07-21: qty 1

## 2022-07-21 MED ORDER — CALCIUM GLUCONATE-NACL 1-0.675 GM/50ML-% IV SOLN
1.0000 g | Freq: Once | INTRAVENOUS | Status: AC
  Administered 2022-07-21: 1000 mg via INTRAVENOUS
  Filled 2022-07-21: qty 50

## 2022-07-21 MED ORDER — HYDROMORPHONE HCL 1 MG/ML IJ SOLN
0.5000 mg | INTRAMUSCULAR | Status: DC | PRN
  Administered 2022-07-21 – 2022-07-24 (×13): 0.5 mg via INTRAVENOUS
  Filled 2022-07-21 (×13): qty 0.5

## 2022-07-21 MED ORDER — LEVOTHYROXINE SODIUM 25 MCG PO TABS
125.0000 ug | ORAL_TABLET | Freq: Every day | ORAL | Status: DC
  Administered 2022-07-22 – 2022-07-27 (×6): 125 ug via ORAL
  Filled 2022-07-21 (×6): qty 1

## 2022-07-21 MED ORDER — SODIUM CHLORIDE 0.9 % IV BOLUS
1000.0000 mL | Freq: Once | INTRAVENOUS | Status: AC
  Administered 2022-07-21: 1000 mL via INTRAVENOUS

## 2022-07-21 MED ORDER — ONDANSETRON HCL 4 MG/2ML IJ SOLN: 4.0000 mg | Freq: Four times a day (QID) | INTRAMUSCULAR | Status: DC | PRN

## 2022-07-21 MED ORDER — GUAIFENESIN ER 600 MG PO TB12
600.0000 mg | ORAL_TABLET | Freq: Two times a day (BID) | ORAL | Status: DC
  Administered 2022-07-21 – 2022-07-27 (×8): 600 mg via ORAL
  Filled 2022-07-21 (×12): qty 1

## 2022-07-21 MED ORDER — PANTOPRAZOLE SODIUM 40 MG PO TBEC: 40.0000 mg | DELAYED_RELEASE_TABLET | Freq: Every day | ORAL | Status: DC | PRN

## 2022-07-21 MED ORDER — B COMPLEX-C PO TABS
1.0000 | ORAL_TABLET | Freq: Every day | ORAL | Status: DC
  Administered 2022-07-22 – 2022-07-27 (×6): 1 via ORAL
  Filled 2022-07-21 (×6): qty 1

## 2022-07-21 MED ORDER — SODIUM CHLORIDE 0.9 % IV BOLUS
500.0000 mL | Freq: Once | INTRAVENOUS | Status: AC
  Administered 2022-07-21: 500 mL via INTRAVENOUS

## 2022-07-21 MED ORDER — AZELASTINE HCL 0.1 % NA SOLN: 1.0000 | Freq: Every day | NASAL | Status: DC | PRN

## 2022-07-21 MED ORDER — SENNOSIDES-DOCUSATE SODIUM 8.6-50 MG PO TABS
1.0000 | ORAL_TABLET | Freq: Every evening | ORAL | Status: DC | PRN
  Administered 2022-07-25 – 2022-07-26 (×2): 1 via ORAL
  Filled 2022-07-21 (×3): qty 1

## 2022-07-21 MED ORDER — ACETAMINOPHEN 325 MG PO TABS
650.0000 mg | ORAL_TABLET | Freq: Four times a day (QID) | ORAL | Status: DC | PRN
  Administered 2022-07-21 – 2022-07-22 (×2): 650 mg via ORAL
  Filled 2022-07-21 (×2): qty 2

## 2022-07-21 MED ORDER — B COMPLEX VITAMINS PO CAPS: 1.0000 | ORAL_CAPSULE | Freq: Every day | ORAL | Status: DC

## 2022-07-21 MED ORDER — CALCIUM GLUCONATE 10 % IV SOLN
1.0000 g | Freq: Once | INTRAVENOUS | Status: DC
  Filled 2022-07-21: qty 10

## 2022-07-21 MED ORDER — OXYCODONE HCL 5 MG PO TABS
5.0000 mg | ORAL_TABLET | ORAL | Status: DC | PRN
  Administered 2022-07-21 – 2022-07-27 (×10): 5 mg via ORAL
  Filled 2022-07-21 (×10): qty 1

## 2022-07-21 MED ORDER — SODIUM ZIRCONIUM CYCLOSILICATE 10 G PO PACK
10.0000 g | PACK | Freq: Every day | ORAL | Status: DC
  Administered 2022-07-21 – 2022-07-23 (×2): 10 g via ORAL
  Filled 2022-07-21 (×5): qty 1

## 2022-07-21 MED ORDER — SODIUM CHLORIDE 0.9 % IV SOLN: INTRAVENOUS | Status: DC

## 2022-07-21 NOTE — ED Notes (Signed)
ED TO INPATIENT HANDOFF REPORT  ED Nurse Name and Phone #: Baxter Flattery, RN  S Name/Age/Gender Alison Bridges 88 y.o. female Room/Bed: WA15/WA15  Code Status   Code Status: Prior  Home/SNF/Other Skilled nursing facility Patient oriented to: self, place, time, and situation Is this baseline? Yes   Triage Complete: Triage complete  Chief Complaint Symptomatic anemia [D64.9]  Triage Note Per EMS- Patient lives at [facility].  Patient was sen yesterday for a fall. Patient had an ankle fracture and a wrist fracture.  Patient c/o vomiting and urinary retention. Patient states she has not urinated since yesterday. Patient reports increased dizziness with movement.   Allergies No Known Allergies  Level of Care/Admitting Diagnosis ED Disposition     ED Disposition  Admit   Condition  --   Comment  Hospital Area: [facility] [[id number]]  Level of Care: Telemetry [5]  Admit to tele based on following criteria: Complex arrhythmia (Bradycardia/Tachycardia)  Admit to tele based on following criteria: Eval of Syncope  May admit patient to [facility] or [facility] if equivalent level of care is available:: Yes  Covid Evaluation: Asymptomatic - no recent exposure (last 10 days) testing not required  Diagnosis: Symptomatic anemia [3818299]  Admitting Physician: [name] [[id number]]  Attending Physician: [name] [[id number]]  Certification:: I certify there are rare and unusual circumstances requiring inpatient admission  Estimated Length of Stay: 3          B Medical/Surgery History Past Medical History:  Diagnosis Date   Anemia    Arthritis    C. difficile diarrhea    Cancer ([place])    Chronic kidney disease    Depression    Dyspnea    Shortness of breath with low hemoglobin   GERD (gastroesophageal reflux disease)    Hypothyroidism    Myelodysplastic disease ([place])    Osteoporosis    Past Surgical History:  Procedure  Laterality Date   ABDOMINAL HYSTERECTOMY     APPENDECTOMY  1998   CATARACT EXTRACTION W/ INTRAOCULAR LENS IMPLANT Bilateral    CHOLECYSTECTOMY  1998   TRANSURETHRAL RESECTION OF BLADDER TUMOR N/A 05/08/2022   Procedure: TRANSURETHRAL RESECTION OF BLADDER TUMOR (TURBT);  Surgeon: [name], MD;  Location: [facility];  Service: Urology;  Laterality: N/A;  ONLY NEEDS 30 MIN     A IV Location/Drains/Wounds Patient Lines/Drains/Airways Status     Active Line/Drains/Airways     Name Placement date Placement time Site Days   Peripheral IV 07/21/22 20 G 1" Anterior;Right Forearm 07/21/22  1057  Forearm  less than 1   Incision (Closed) 05/08/22 N/A Other (Comment) 05/08/22  1408  -- 74            Intake/Output Last 24 hours  Intake/Output Summary (Last 24 hours) at 07/21/2022 1553 Last data filed at 07/21/2022 1036 Gross per 24 hour  Intake --  Output 260 ml  Net -260 ml    Labs/Imaging Results for orders placed or performed during the hospital encounter of 07/21/22 (from the past 48 hour(s))  Urinalysis, Routine w reflex microscopic Urine, Clean Catch     Status: Abnormal   Collection Time: 07/21/22  9:13 AM  Result Value Ref Range   Color, Urine AMBER (A) YELLOW    Comment: BIOCHEMICALS MAY BE AFFECTED BY COLOR   APPearance HAZY (A) CLEAR   Specific Gravity, Urine 1.016 1.005 - 1.030   pH 5.0 5.0 - 8.0   Glucose, UA NEGATIVE NEGATIVE  mg/dL   Hgb urine dipstick NEGATIVE NEGATIVE   Bilirubin Urine NEGATIVE NEGATIVE   Ketones, ur NEGATIVE NEGATIVE mg/dL   Protein, ur 100 (A) NEGATIVE mg/dL   Nitrite NEGATIVE NEGATIVE   Leukocytes,Ua NEGATIVE NEGATIVE   RBC / HPF 0-5 0 - 5 RBC/hpf   WBC, UA 0-5 0 - 5 WBC/hpf   Bacteria, UA NONE SEEN NONE SEEN   Mucus PRESENT     Comment: Performed at [facility], [place] [address], [place], [place] [address]  Comprehensive metabolic panel     Status: Abnormal   Collection Time: 07/21/22  9:30 AM  Result  Value Ref Range   Sodium 129 (L) 135 - 145 mmol/L   Potassium 5.9 (H) 3.5 - 5.1 mmol/L   Chloride 98 98 - 111 mmol/L   CO2 17 (L) 22 - 32 mmol/L   Glucose, Bld 130 (H) 70 - 99 mg/dL    Comment: Glucose reference range applies only to samples taken after fasting for at least 8 hours.   BUN 46 (H) 8 - 23 mg/dL   Creatinine, Ser 2.49 (H) 0.44 - 1.00 mg/dL    Comment: DELTA CHECK NOTED   Calcium 8.7 (L) 8.9 - 10.3 mg/dL   Total Protein 6.8 6.5 - 8.1 g/dL   Albumin 3.6 3.5 - 5.0 g/dL   AST 37 15 - 41 U/L   ALT 19 0 - 44 U/L   Alkaline Phosphatase 64 38 - 126 U/L   Total Bilirubin 0.5 0.3 - 1.2 mg/dL   GFR, Estimated 18 (L) >60 mL/min    Comment: (NOTE) Calculated using the CKD-EPI Creatinine Equation (2021)    Anion gap 14 5 - 15    Comment: Performed at [facility], [place] [address], [place], [place] [address]  CBC with Differential     Status: Abnormal   Collection Time: 07/21/22  9:30 AM  Result Value Ref Range   WBC 5.8 4.0 - 10.5 K/uL   RBC 1.93 (L) 3.87 - 5.11 MIL/uL   Hemoglobin 6.4 (LL) 12.0 - 15.0 g/dL    Comment: REPEATED TO VERIFY THIS CRITICAL RESULT HAS VERIFIED AND BEEN CALLED TO BINGHAM,S RN BY GOLSON,M ON 12 17 2023 AT 1013, AND HAS BEEN READ BACK.     HCT 20.8 (L) 36.0 - 46.0 %   MCV 107.8 (H) 80.0 - 100.0 fL   MCH 33.2 26.0 - 34.0 pg   MCHC 30.8 30.0 - 36.0 g/dL   RDW 20.9 (H) 11.5 - 15.5 %   Platelets 74 (L) 150 - 400 K/uL    Comment: SPECIMEN CHECKED FOR CLOTS Immature Platelet Fraction may be clinically indicated, consider ordering this additional test [id number] REPEATED TO VERIFY PLATELET COUNT CONFIRMED BY SMEAR    nRBC 0.7 (H) 0.0 - 0.2 %   Neutrophils Relative % 37 %   Neutro Abs 2.1 1.7 - 7.7 K/uL   Lymphocytes Relative 21 %   Lymphs Abs 1.2 0.7 - 4.0 K/uL   Monocytes Relative 40 %   Monocytes Absolute 2.3 (H) 0.1 - 1.0 K/uL   Eosinophils Relative 0 %   Eosinophils Absolute 0.0 0.0 - 0.5 K/uL   Basophils Relative 0 %    Basophils Absolute 0.0 0.0 - 0.1 K/uL   Metamyelocytes Relative 1 %   Myelocytes 1 %   Abs Immature Granulocytes 0.10 (H) 0.00 - 0.07 K/uL   Ovalocytes PRESENT     Comment: Performed at [facility], [place] [address]  [place], [place] [address]  Resp panel by RT-PCR (RSV, Flu A&B, Covid) Anterior Nasal Swab     Status: None   Collection Time: 07/21/22  9:30 AM   Specimen: Anterior Nasal Swab  Result Value Ref Range   SARS Coronavirus 2 by RT PCR NEGATIVE NEGATIVE    Comment: (NOTE) SARS-CoV-2 target nucleic acids are NOT DETECTED.  The SARS-CoV-2 RNA is generally detectable in upper respiratory specimens during the acute phase of infection. The lowest concentration of SARS-CoV-2 viral copies this assay can detect is 138 copies/mL. A negative result does not preclude SARS-Cov-2 infection and should not be used as the sole basis for treatment or other patient management decisions. A negative result may occur with  improper specimen collection/handling, submission of specimen other than nasopharyngeal swab, presence of viral mutation(s) within the areas targeted by this assay, and inadequate number of viral copies(<138 copies/mL). A negative result must be combined with clinical observations, patient history, and epidemiological information. The expected result is Negative.  Fact Sheet for Patients:  EntrepreneurPulse.com.au  Fact Sheet for Healthcare Providers:  IncredibleEmployment.be  This test is no t yet approved or cleared by the [place] FDA and  has been authorized for detection and/or diagnosis of SARS-CoV-2 by FDA under an Emergency Use Authorization (EUA). This EUA will remain  in effect (meaning this test can be used) for the duration of the COVID-19 declaration under Section 564(b)(1) of the Act, 21 U.S.C.section 360bbb-3(b)(1), unless the authorization is terminated  or revoked sooner.       Influenza A by  PCR NEGATIVE NEGATIVE   Influenza B by PCR NEGATIVE NEGATIVE    Comment: (NOTE) The Xpert Xpress SARS-CoV-2/FLU/RSV plus assay is intended as an aid in the diagnosis of influenza from Nasopharyngeal swab specimens and should not be used as a sole basis for treatment. Nasal washings and aspirates are unacceptable for Xpert Xpress SARS-CoV-2/FLU/RSV testing.  Fact Sheet for Patients: EntrepreneurPulse.com.au  Fact Sheet for Healthcare Providers: IncredibleEmployment.be  This test is not yet approved or cleared by the [place] FDA and has been authorized for detection and/or diagnosis of SARS-CoV-2 by FDA under an Emergency Use Authorization (EUA). This EUA will remain in effect (meaning this test can be used) for the duration of the COVID-19 declaration under Section 564(b)(1) of the Act, 21 U.S.C. section 360bbb-3(b)(1), unless the authorization is terminated or revoked.     Resp Syncytial Virus by PCR NEGATIVE NEGATIVE    Comment: (NOTE) Fact Sheet for Patients: EntrepreneurPulse.com.au  Fact Sheet for Healthcare Providers: IncredibleEmployment.be  This test is not yet approved or cleared by the [place] FDA and has been authorized for detection and/or diagnosis of SARS-CoV-2 by FDA under an Emergency Use Authorization (EUA). This EUA will remain in effect (meaning this test can be used) for the duration of the COVID-19 declaration under Section 564(b)(1) of the Act, 21 U.S.C. section 360bbb-3(b)(1), unless the authorization is terminated or revoked.  Performed at [facility], [place] [address], [place], [place] [address]   Magnesium     Status: None   Collection Time: 07/21/22  9:58 AM  Result Value Ref Range   Magnesium 2.1 1.7 - 2.4 mg/dL    Comment: Performed at [facility], [place] [address], [place], [place] [address]  Type and screen [place]     Status: None   Collection Time: 07/21/22 10:16 AM  Result Value Ref Range   ABO/RH(D) O POS    Antibody  Screen NEG    Sample Expiration      07/24/2022,2359 Performed at [facility], [place] [address] [place], [place] [address]   Prepare RBC (crossmatch)     Status: None   Collection Time: 07/21/22 11:36 AM  Result Value Ref Range   Order Confirmation      ORDER PROCESSED BY BLOOD BANK Performed at [facility], [place] [address] [place], [place] [address]    CT CHEST ABDOMEN PELVIS WO CONTRAST  Result Date: 07/21/2022 CLINICAL DATA:  Trauma EXAM: CT CHEST, ABDOMEN AND PELVIS WITHOUT CONTRAST TECHNIQUE: Multidetector CT imaging of the chest, abdomen and pelvis was performed following the standard protocol without IV contrast. RADIATION DOSE REDUCTION: This exam was performed according to the departmental dose-optimization program which includes automated exposure control, adjustment of the mA and/or kV according to patient size and/or use of iterative reconstruction technique. COMPARISON:  CT abdomen and pelvis done on 01/28/2022 FINDINGS: CT CHEST FINDINGS Cardiovascular: There are scattered coronary artery calcifications. Calcifications are seen in thoracic aorta and its major branches. Mediastinum/Nodes: No significant lymphadenopathy seen. There is no mediastinal hematoma. Thyroid appears smaller than usual in size with few coarse calcifications. Lungs/Pleura: There is no focal pulmonary consolidation. There is no pleural effusion or pneumothorax. Increased markings with tree-in-bud appearance is seen in posterior segment of right upper lobe. Passive congestive changes are noted in the posterior lower lung fields on both sides. In image 31 of series 6, there are 2 small nodular densities larger 1 measuring 6 mm. There is small area of increased interstitial markings in the anteromedial left upper lobe in image 68. There is a linear  infiltrate in anteromedial left upper lobe in image 79. In image 100, there is a 3 mm nodule in left lower lobe. There is no pleural effusion or pneumothorax. Musculoskeletal: No acute findings are seen in bony structures. There is old healed fracture in the anterior right ninth rib. CT ABDOMEN PELVIS FINDINGS Hepatobiliary: Liver is enlarged measuring 23.5 cm in length. There is no dilation of bile ducts. Surgical clips are seen in gallbladder fossa. Pancreas: No focal abnormalities are seen. Spleen: Spleen is enlarged measuring 15.6 cm in length. Adrenals/Urinary Tract: Adrenals are unremarkable. There is no hydronephrosis. There are a few left renal calculi he is measuring less than 5 mm. There are few smooth marginated fluid density lesions in both kidneys largest measuring 7.3 cm in size in the anterior upper pole of left kidney. In image 87 of series 2, there is 11 mm smooth marginated exophytic lesion with density measurements higher than usual for simple cyst in the lower pole of left kidney. In image 72, there is 8 mm exophytic lesion with density measurements higher than usual in the midportion of right kidney. These may suggest hemorrhagic or proteinaceous cysts. No significant interval changes are noted. There are no demonstrable ureteral stones. Urinary bladder is unremarkable. Stomach/Bowel: Small hiatal hernia is seen. There is fluid in the lumen of thoracic esophagus suggesting possible gastroesophageal reflux. High density is seen in the lumen of the thoracic esophagus, stomach and duodenum, possibly oral contrast or oral medication. Small bowel loops are unremarkable. Appendix is not seen. There is no pericecal inflammation. There is no significant wall thickening in colon. Scattered diverticula are seen in colon without signs of focal acute diverticulitis. Vascular/Lymphatic: There are scattered calcifications in abdominal aorta and its major branches. Reproductive: Uterus is not seen. Other:  There is no ascites or pneumoperitoneum. There is subcutaneous  contusion and hematoma in left gluteal region. There is a hematoma measuring 9.2 x 5.3 x 2.2 cm in subcutaneous plane in the lateral left gluteal region. Musculoskeletal: No recent displaced fractures are seen. There is first-degree anterolisthesis at the L5-S1 level. Degenerative changes are noted in lumbar spine with encroachment of neural foramina at multiple levels. Degenerative changes are noted in both hips. IMPRESSION: There is subcutaneous contusion and hematoma in lateral left gluteal region. Hematoma in this region measures 9.2 x 5.3 x 2.2 cm. No other acute findings are seen in noncontrast CT chest, abdomen and pelvis. There is no mediastinal or retroperitoneal hematoma. There is no laceration in solid organs. There is no ascites or pneumoperitoneum. Increased interstitial markings with tree-in-bud appearance in posterior segment of right upper lobe suggesting possible chronic inflammation or scarring. There is small patchy interstitial infiltrate in anteromedial aspect of left upper lobe suggesting scarring or small focus of pneumonia. There are few nodular densities in left lung measuring up to 6 mm in size. Please take into consideration patient's age in regard to the following recommendation. Non-contrast chest CT at 3-6 months is recommended. If the nodules are stable at time of repeat CT, then future CT at 18-24 months (from today's scan) is considered optional for low-risk patients, but is recommended for high-risk patients. This recommendation follows the consensus statement: Guidelines for Management of Incidental Pulmonary Nodules Detected on CT Images: From the Fleischner Society 2017; Radiology 2017; 284:228-243. Enlarged liver and spleen. Diverticulosis of colon without signs of focal diverticulitis. Gastroesophageal reflux. Left renal stones. Renal cysts. There are few small exophytic lesions in the kidneys with density  measurements higher than usual for simple cyst, possibly hemorrhagic or proteinaceous cysts. This finding has not changed significantly. Aortic arteriosclerosis. Coronary artery calcifications are seen. Other findings as described in the body of the report. Electronically Signed   By: Elmer Picker M.D.   On: 07/21/2022 12:50   DG Hand Complete Left  Result Date: 07/20/2022 CLINICAL DATA:  Fall on outstretched hand. EXAM: LEFT HAND - COMPLETE 3+ VIEW COMPARISON:  None Available. FINDINGS: A comminuted angulated displaced fracture of the distal radius extending into the radiocarpal joint is again identified. And ulnar styloid fractures again identified. Carpal bones are intact. Degenerative changes are identified between the base of the first metacarpal and trapezium. Degenerative changes are identified throughout the interphalangeal joints most prominent in the thumb. No fractures are identified in the hand. IMPRESSION: 1. Comminuted angulated displaced fracture of the distal radius extending into the radiocarpal joint. Ulnar styloid fracture. 2. Degenerative changes as above. 3. No fractures in the hand. Electronically Signed   By: Dorise Bullion III M.D.   On: 07/20/2022 16:50   DG Wrist Complete Left  Result Date: 07/20/2022 CLINICAL DATA:  Pain after fall EXAM: LEFT WRIST - COMPLETE 3+ VIEW COMPARISON:  None Available. FINDINGS: There is a displaced and angulated comminuted fracture through the distal radius. There appears to be extension into the radiocarpal joint. There is an ulnar styloid fracture. No wrist dislocation. No other abnormalities. IMPRESSION: 1. Displaced and angulated comminuted fracture through the distal radius. There appears to be extension into the radiocarpal joint. 2. Ulnar styloid fracture. Electronically Signed   By: Dorise Bullion III M.D.   On: 07/20/2022 16:48   DG Foot Complete Left  Result Date: 07/20/2022 CLINICAL DATA:  Fall.  Swelling. EXAM: LEFT FOOT -  COMPLETE 3+ VIEW COMPARISON:  None Available. FINDINGS: There is a hallux valgus deformity. No fractures  identified in the left foot. IMPRESSION: No fractures identified in the left foot. No acute abnormality in the left foot. Lateral malleolar soft tissue swelling with a distal fibular tip fracture better appreciated on the ankle x-rays. Electronically Signed   By: Dorise Bullion III M.D.   On: 07/20/2022 16:47   DG Ankle Complete Left  Result Date: 07/20/2022 CLINICAL DATA:  Pain after twisting ankle EXAM: LEFT ANKLE COMPLETE - 3+ VIEW COMPARISON:  None Available. FINDINGS: There is a fracture through the distal tip of the fibula with overlying soft tissue swelling. The ankle mortise is intact. No other fractures are identified. No other acute abnormalities. IMPRESSION: Fracture through the distal tip of the fibula with overlying soft tissue swelling. Electronically Signed   By: Dorise Bullion III M.D.   On: 07/20/2022 16:45    Pending Labs Unresulted Labs (From admission, onward)    None       Vitals/Pain Today's Vitals   07/21/22 1430 07/21/22 1500 07/21/22 1507 07/21/22 1530  BP: (!) 143/55 (!) 138/52  (!) 144/54  Pulse: 90 86  88  Resp: (!) 22   17  Temp:   98.1 F (36.7 C)   TempSrc:   Oral   SpO2: 99% 98%  96%  Weight:      Height:      PainSc:        Isolation Precautions No active isolations  Medications Medications  0.9 %  sodium chloride infusion (Manually program via Guardrails IV Fluids) (0 mLs Intravenous Hold 07/21/22 1219)  HYDROcodone-acetaminophen (NORCO/VICODIN) 5-325 MG per tablet 1 tablet (1 tablet Oral Given 07/21/22 0945)  sodium chloride 0.9 % bolus 1,000 mL (0 mLs Intravenous Stopped 07/21/22 1151)  sodium chloride 0.9 % bolus 500 mL (0 mLs Intravenous Stopped 07/21/22 1233)  sodium zirconium cyclosilicate (LOKELMA) packet 10 g (10 g Oral Given 07/21/22 1150)  calcium gluconate 1 g/ 50 mL sodium chloride IVPB (0 mg Intravenous Stopped 07/21/22 1435)   morphine (PF) 2 MG/ML injection 2 mg (2 mg Intravenous Given 07/21/22 1503)    Mobility walks Moderate fall risk   Focused Assessments Cardiac Assessment Handoff:    Lab Results  Component Value Date   CKTOTAL 30 (L) 01/13/2022   No results found for: "DDIMER" Does the Patient currently have chest pain? No    R Recommendations: See Admitting Provider Note  Report given to:   Additional Notes:

## 2022-07-21 NOTE — ED Notes (Signed)
Lab called and advised there is a delay in blood processing due to antibodies, provider notified.

## 2022-07-21 NOTE — ED Notes (Signed)
Pt ring was cut off do to swelling on ring finger with permission from patient. Ring was placed in ziplock bag and placed in pt purse.

## 2022-07-21 NOTE — ED Provider Triage Note (Addendum)
Emergency Medicine Provider Triage Evaluation Note  BRITAINY KOZUB , a 88 y.o. female  was evaluated in triage.  Pt complains of nausea, poor appetite and no urinationx1 day. She notes last time she urinated was at noon yesterday. Had fall yesterday and was seen in ED, since being discharged has had nausea and some vomiting. Feels weak. Hand is numb. No color changes however. Also states cast is coming loose  Review of Systems  Positive: Urinary retention Negative: Abdominal pain  Physical Exam  BP (!) 103/54 (BP Location: Right Arm)   Pulse 87   Temp 97.6 F (36.4 C)   Resp 16   SpO2 98%  Gen:   Awake, no distress   Resp:  Normal effort  MSK:   Moves extremities without difficulty  Other:  +suprapubic ttp, +left radial pulse, ROM intact for digits of left hand, extensive bruising  Medical Decision Making  Medically screening exam initiated at 9:13 AM.  Appropriate orders placed.  KEYATTA TOLLES was informed that the remainder of the evaluation will be completed by another provider, this initial triage assessment does not replace that evaluation, and the importance of remaining in the ED until their evaluation is complete.     Osvaldo Shipper, [place] 07/21/22 0915  Notified by RN, hgb 6.4, K 5.9, will obtain EKG, STAT room   Pope Brunty L, PA 07/21/22 1018

## 2022-07-21 NOTE — ED Triage Notes (Signed)
Per EMS- Patient lives at [facility].  Patient was sen yesterday for a fall. Patient had an ankle fracture and a wrist fracture.  Patient c/o vomiting and urinary retention. Patient states she has not urinated since yesterday. Patient reports increased dizziness with movement.

## 2022-07-21 NOTE — H&P (Signed)
History and Physical  Patient: Alison Bridges IRS:[id number] DOB: 31-Jul-1929 DOA: 07/21/2022 DOS: the patient was seen and examined on 07/21/2022 Patient coming from: SNF  Chief Complaint:  Chief Complaint  Patient presents with   Emesis   Urinary Retention   HPI: Alison Bridges is a 88 y.o. female with PMH significant of MDS with pancytopenia, GERD, hypothyroidism, depression, CKD 3B presented to hospital with complaints of a fall. Patient is on [place].  She was sleeping in a chair.  When she stood up she misplaced her step, twisted her left ankle and fell injuring her left side.  She was seen in the ED on 12/16.  Was found to have left wrist fracture with open wound as well as left ankle fracture. Wrist fracture was treated conservatively.  She had a laceration which was treated with sutures.  Hand surgery was consulted and recommendation was for outpatient follow-up.  And patient was sent back to [facility]. Patient started having vomiting and urinary retention and therefore she was brought to the ER. At the time of my evaluation patient denies any complaints of headache, neck pain.  No chest pain.  No abdominal pain.  No nausea or vomiting.  No trouble breathing. She does report her pain in her left wrist is not controlled with morphine. She denies any numbness or tingling on her left wrist. She denies any weakness in the left side but does report pain on her left gluteal region which is where she has some swelling. She does not have any pain in her ankle as long as she is not trying to move it. Denies any change in her medications after discharge from the hospital. No burning urination.  Review of Systems: As mentioned in the history of present illness. All other systems reviewed and are negative. Past Medical History:  Diagnosis Date   Anemia    Arthritis    C. difficile diarrhea    Cancer (HCC)    Chronic kidney disease    Depression    Dyspnea    Shortness of breath with  low hemoglobin   GERD (gastroesophageal reflux disease)    Hypothyroidism    Myelodysplastic disease ([place])    Osteoporosis    Past Surgical History:  Procedure Laterality Date   ABDOMINAL HYSTERECTOMY     APPENDECTOMY  1998   CATARACT EXTRACTION W/ INTRAOCULAR LENS IMPLANT Bilateral    CHOLECYSTECTOMY  1998   TRANSURETHRAL RESECTION OF BLADDER TUMOR N/A 05/08/2022   Procedure: TRANSURETHRAL RESECTION OF BLADDER TUMOR (TURBT);  Surgeon: [name], MD;  Location: [facility];  Service: Urology;  Laterality: N/A;  ONLY NEEDS 30 MIN   Social History:  reports that she has never smoked. She has never used smokeless tobacco. She reports that she does not drink alcohol and does not use drugs. No Known Allergies Family History  Problem Relation Age of Onset   Heart attack Mother 33   Pancreatic cancer Father    Parkinson's disease Sister    Diabetes Brother        Multiple complications   Heart disease Brother        Not sure what happened   Dementia Brother    Prior to Admission medications   Medication Sig Start Date End Date Taking? Authorizing Provider  Ascorbic Acid (VITAMIN C PO) Take 1 tablet by mouth daily.   Yes [provider]  Azelastine HCl 137 MCG/SPRAY SOLN Place 1 spray into both nostrils daily as needed (allergies). 05/09/21  Yes [provider]  b complex vitamins capsule Take 1 capsule by mouth daily.   Yes [provider]  cholecalciferol (VITAMIN D) 1000 UNITS tablet Take 1,000 Units by mouth daily.   Yes [provider]  Darbepoetin Alfa (ARANESP, ALBUMIN FREE,) 150 MCG/0.3ML SOSY injection Inject 150 mcg into the skin every 30 (thirty) days. 12-19-21 last dose given   Yes [provider]  Dextromethorphan HBr (DELSYM PO) Take 10 mLs by mouth daily.   Yes [provider]  guaiFENesin (MUCINEX) 600 MG 12 hr tablet Take 600 mg by mouth 2 (two) times daily.   Yes [provider]  levothyroxine  (SYNTHROID, LEVOTHROID) 125 MCG tablet Take 125 mcg by mouth daily before breakfast.   Yes [provider]  nystatin (MYCOSTATIN) 100000 UNIT/ML suspension Take 5 mLs (500,000 Units total) by mouth 4 (four) times daily. Swish in the mouth for 1-2 mins then swallow 07/17/22  Yes [name], MD  OVER THE COUNTER MEDICATION Take 1 capsule by mouth daily. Blood Builder   Yes [provider]  pantoprazole (PROTONIX) 40 MG tablet Take 40 mg by mouth daily as needed (acid reflux).   Yes [provider]  sertraline (ZOLOFT) 100 MG tablet Take 100 mg by mouth daily. 03/13/20  Yes [provider]  TRELEGY ELLIPTA 100-62.5-25 MCG/ACT AEPB Take 1 puff by mouth daily. 07/05/22  Yes [provider]  oxybutynin (DITROPAN) 5 MG tablet Take 1 tablet (5 mg total) by mouth every 8 (eight) hours as needed for bladder spasms. Patient not taking: Reported on 06/04/2022 05/08/22   [name], MD  phenazopyridine (PYRIDIUM) 200 MG tablet Take 1 tablet (200 mg total) by mouth 3 (three) times daily as needed (for pain with urination). Patient not taking: Reported on 07/21/2022 05/08/22 05/08/23  [name], MD  traMADol (ULTRAM) 50 MG tablet Take 1 tablet (50 mg total) by mouth every 6 (six) hours as needed. Patient not taking: Reported on 07/21/2022 07/20/22   Fredia Sorrow, MD   Physical Exam: Vitals:   07/21/22 1507 07/21/22 1530 07/21/22 1600 07/21/22 1642  BP:  (!) 144/54 (!) 135/47 (!) 138/47  Pulse:  88 86 90  Resp:  '17 17 16  '$ Temp: 98.1 F (36.7 C)   98.5 F (36.9 C)  TempSrc: Oral   Oral  SpO2:  96% 92% 94%  Weight:      Height:       General: Appear in marked distress; no visible Abnormal Neck Mass Or lumps, Conjunctiva normal Cardiovascular: S1 and S2 Present, no Murmur, Respiratory: good respiratory effort, Bilateral Air entry present and CTA, no Crackles, no wheezes Abdomen: Bowel Sound present, Non tender   Extremities: no Pedal edema, left wrist is wrapped in a splint which has some old blood.  No bruising seen on the left gluteal region Neurology: alert and oriented to time, place, and person Gait not checked due to patient safety concerns   Data Reviewed: Since last encounter, pertinent lab results CBC and BMP   . I have ordered test including CBC and BMP  . I have discussed pt's care plan and test results with EDP and hand surgery  .   Assessment and Plan Acute blood loss anemia Traumatic in nature. Patient did have large wrist laceration which was repaired in the ER. Also has left gluteal hematoma of decent size. Patient is not on any anticoagulation. Currently hemoglobin is 6.4. Will receive PRBC transfusion. And monitor H&H after that.  Acute kidney injury on CKD 3B. Hyperkalemia. EKG does show evidence of some minimal T wave peaking. Potassium level 5.2. Serum creatinine 2.4 baseline around 1.4. Will initiate IV fluids.  Currently receiving IV blood transfusion as well. Will give Lokelma. Monitor on telemetry.  Left wrist fracture with a laceration. Significant gapping wound with exposure of some underlying structures without any communication with the fracture per EDP note. 16 sutures were used to close the ulnar side wound. EDP discussed with Dr. Greta Doom on call yesterday and recommendation was for outpatient follow-up. I have called the office of Dr. Greta Doom and requested an inpatient consultation. In case if the plan for any intervention we will keep n.p.o. after midnight.  Distal fibular fracture on the left. Per EDP's discussion with the orthopedics, weight-bear as tolerated. Treated conservatively.  Left gluteal hematoma. Currently will monitor. If the hemoglobin does not respond will prefer nonweightbearing and rest.  Myelodysplastic syndrome. Pancytopenia. Follows up with Dr. Irene Limbo.  Baseline platelets ranges between 50-70,000 Baseline hemoglobin ranges  around 8 And baseline WBC is around 2 or less Currently platelets are stable.  Hemoglobin is 5.3.  Receiving PRBC transfusion.  Will monitor.  Goals of care conversation. Patient prefers to be DNR/DNI understand the meaning of it. Will monitor.  Advance Care Planning:   Code Status: DNR  Consults: none Family Communication: No one at bedside.  Author: Berle Mull, MD 07/21/2022 7:01 PM For on call review [email or web address].

## 2022-07-21 NOTE — ED Provider Notes (Signed)
[facility] DEPT Provider Note   CSN: [id number] Arrival date & time: 07/21/22  0855     History  Chief Complaint  Patient presents with   Emesis   Urinary Retention    Alison Bridges is a 88 y.o. female.  HPI Patient fell yesterday at her independent living facility.  She tripped and injured her left wrist and left ankle.  For evaluation she had a wrist fracture with laceration that was repaired and fibula fracture amenable to walking boot.  Patient reports that she continued to feel more weak overnight and an episode of vomiting.  She reports she was unable to eat or drink anything.  She reports that she then started to feel more generally weak and started to get lightheaded with activity.  She reports she has not urinated since yesterday.  She denies she had any repeat fall.  She has not had any syncope.  She denies she has chest pain or significant shortness of breath.    Home Medications Prior to Admission medications   Medication Sig Start Date End Date Taking? Authorizing Provider  Ascorbic Acid (VITAMIN C PO) Take 1 tablet by mouth daily.   Yes [provider]  Azelastine HCl 137 MCG/SPRAY SOLN Place 1 spray into both nostrils daily as needed (allergies). 05/09/21  Yes [provider]  b complex vitamins capsule Take 1 capsule by mouth daily.   Yes [provider]  cholecalciferol (VITAMIN D) 1000 UNITS tablet Take 1,000 Units by mouth daily.   Yes [provider]  Darbepoetin Alfa (ARANESP, ALBUMIN FREE,) 150 MCG/0.3ML SOSY injection Inject 150 mcg into the skin every 30 (thirty) days. 12-19-21 last dose given   Yes [provider]  Dextromethorphan HBr (DELSYM PO) Take 10 mLs by mouth daily.   Yes [provider]  guaiFENesin (MUCINEX) 600 MG 12 hr tablet Take 600 mg by mouth 2 (two) times daily.   Yes [provider]  levothyroxine (SYNTHROID, LEVOTHROID) 125 MCG tablet Take 125 mcg  by mouth daily before breakfast.   Yes [provider]  nystatin (MYCOSTATIN) 100000 UNIT/ML suspension Take 5 mLs (500,000 Units total) by mouth 4 (four) times daily. Swish in the mouth for 1-2 mins then swallow 07/17/22  Yes [name], MD  OVER THE COUNTER MEDICATION Take 1 capsule by mouth daily. Blood Builder   Yes [provider]  pantoprazole (PROTONIX) 40 MG tablet Take 40 mg by mouth daily as needed (acid reflux).   Yes [provider]  sertraline (ZOLOFT) 100 MG tablet Take 100 mg by mouth daily. 03/13/20  Yes [provider]  TRELEGY ELLIPTA 100-62.5-25 MCG/ACT AEPB Take 1 puff by mouth daily. 07/05/22  Yes [provider]  oxybutynin (DITROPAN) 5 MG tablet Take 1 tablet (5 mg total) by mouth every 8 (eight) hours as needed for bladder spasms. Patient not taking: Reported on 06/04/2022 05/08/22   [name], MD  phenazopyridine (PYRIDIUM) 200 MG tablet Take 1 tablet (200 mg total) by mouth 3 (three) times daily as needed (for pain with urination). Patient not taking: Reported on 07/21/2022 05/08/22 05/08/23  [name], MD  traMADol (ULTRAM) 50 MG tablet Take 1 tablet (50 mg total) by mouth every 6 (six) hours as needed. Patient not taking: Reported on 07/21/2022 07/20/22   Fredia Sorrow, MD      Allergies    Patient has no known allergies.    Review of Systems   Review of Systems  Physical Exam Updated Vital Signs BP (!) 144/54   Pulse 88   Temp 98.1 F (36.7 C) (Oral)   Resp 17   Ht '5\' 8"'$  (1.727 m)   Wt 56.2 kg   SpO2 96%   BMI 18.85 kg/m  Physical Exam Constitutional:      Comments: Patient is alert with clear mental status.  No respiratory distress at rest.  Well-nourished well-developed for age.  She is pale in appearance.  HENT:     Head: Normocephalic and atraumatic.     Mouth/Throat:     Pharynx: Oropharynx is clear.  Eyes:     Extraocular Movements: Extraocular movements  intact.  Cardiovascular:     Rate and Rhythm: Normal rate and regular rhythm.  Pulmonary:     Effort: Pulmonary effort is normal.     Breath sounds: Normal breath sounds.  Abdominal:     General: There is no distension.     Palpations: Abdomen is soft.     Tenderness: There is no abdominal tenderness. There is no guarding.  Musculoskeletal:     Comments: Patient has a splint on the left wrist.  The fingers are warm and dry with good perfusion.  Patient is wearing a walking boot on the left lower extremity.  Skin:    General: Skin is warm and dry.     Coloration: Skin is pale.  Neurological:     General: No focal deficit present.     Mental Status: She is oriented to person, place, and time.     Coordination: Coordination normal.  Psychiatric:        Mood and Affect: Mood normal.     ED Results / Procedures / Treatments   Labs (all labs ordered are listed, but only abnormal results are displayed) Labs Reviewed  COMPREHENSIVE METABOLIC PANEL - Abnormal; Notable for the following components:      Result Value   Sodium 129 (*)    Potassium 5.9 (*)    CO2 17 (*)    Glucose, Bld 130 (*)    BUN 46 (*)    Creatinine, Ser 2.49 (*)    Calcium 8.7 (*)    GFR, Estimated 18 (*)    All other components within normal limits  CBC WITH DIFFERENTIAL/PLATELET - Abnormal; Notable for the following components:   RBC 1.93 (*)    Hemoglobin 6.4 (*)    HCT 20.8 (*)    MCV 107.8 (*)    RDW 20.9 (*)    Platelets 74 (*)    nRBC 0.7 (*)    Monocytes Absolute 2.3 (*)    Abs Immature Granulocytes 0.10 (*)    All other components within normal limits  URINALYSIS, ROUTINE W REFLEX MICROSCOPIC - Abnormal; Notable for the following components:   Color, Urine AMBER (*)    APPearance HAZY (*)    Protein, ur 100 (*)    All other components within normal limits  RESP PANEL BY RT-PCR (RSV, FLU A&B, COVID)  RVPGX2  MAGNESIUM  TYPE AND SCREEN  PREPARE RBC (CROSSMATCH)    EKG EKG  Interpretation  Date/Time:  Sunday July 21 2022 09:23:31 EST Ventricular Rate:  87 PR Interval:  131 QRS Duration: 125 QT Interval:  378 QTC Calculation: 455 R Axis:   267 Text Interpretation: Sinus rhythm RBBB and LAFB agree. BBB and t wave peaking new since prvious Confirmed by Charlesetta Shanks [phone]) on 07/21/2022 9:56:07 AM  Radiology CT CHEST ABDOMEN PELVIS WO CONTRAST  Result Date: 07/21/2022  CLINICAL DATA:  Trauma EXAM: CT CHEST, ABDOMEN AND PELVIS WITHOUT CONTRAST TECHNIQUE: Multidetector CT imaging of the chest, abdomen and pelvis was performed following the standard protocol without IV contrast. RADIATION DOSE REDUCTION: This exam was performed according to the departmental dose-optimization program which includes automated exposure control, adjustment of the mA and/or kV according to patient size and/or use of iterative reconstruction technique. COMPARISON:  CT abdomen and pelvis done on 01/28/2022 FINDINGS: CT CHEST FINDINGS Cardiovascular: There are scattered coronary artery calcifications. Calcifications are seen in thoracic aorta and its major branches. Mediastinum/Nodes: No significant lymphadenopathy seen. There is no mediastinal hematoma. Thyroid appears smaller than usual in size with few coarse calcifications. Lungs/Pleura: There is no focal pulmonary consolidation. There is no pleural effusion or pneumothorax. Increased markings with tree-in-bud appearance is seen in posterior segment of right upper lobe. Passive congestive changes are noted in the posterior lower lung fields on both sides. In image 31 of series 6, there are 2 small nodular densities larger 1 measuring 6 mm. There is small area of increased interstitial markings in the anteromedial left upper lobe in image 68. There is a linear infiltrate in anteromedial left upper lobe in image 79. In image 100, there is a 3 mm nodule in left lower lobe. There is no pleural effusion or pneumothorax. Musculoskeletal: No acute  findings are seen in bony structures. There is old healed fracture in the anterior right ninth rib. CT ABDOMEN PELVIS FINDINGS Hepatobiliary: Liver is enlarged measuring 23.5 cm in length. There is no dilation of bile ducts. Surgical clips are seen in gallbladder fossa. Pancreas: No focal abnormalities are seen. Spleen: Spleen is enlarged measuring 15.6 cm in length. Adrenals/Urinary Tract: Adrenals are unremarkable. There is no hydronephrosis. There are a few left renal calculi he is measuring less than 5 mm. There are few smooth marginated fluid density lesions in both kidneys largest measuring 7.3 cm in size in the anterior upper pole of left kidney. In image 87 of series 2, there is 11 mm smooth marginated exophytic lesion with density measurements higher than usual for simple cyst in the lower pole of left kidney. In image 72, there is 8 mm exophytic lesion with density measurements higher than usual in the midportion of right kidney. These may suggest hemorrhagic or proteinaceous cysts. No significant interval changes are noted. There are no demonstrable ureteral stones. Urinary bladder is unremarkable. Stomach/Bowel: Small hiatal hernia is seen. There is fluid in the lumen of thoracic esophagus suggesting possible gastroesophageal reflux. High density is seen in the lumen of the thoracic esophagus, stomach and duodenum, possibly oral contrast or oral medication. Small bowel loops are unremarkable. Appendix is not seen. There is no pericecal inflammation. There is no significant wall thickening in colon. Scattered diverticula are seen in colon without signs of focal acute diverticulitis. Vascular/Lymphatic: There are scattered calcifications in abdominal aorta and its major branches. Reproductive: Uterus is not seen. Other: There is no ascites or pneumoperitoneum. There is subcutaneous contusion and hematoma in left gluteal region. There is a hematoma measuring 9.2 x 5.3 x 2.2 cm in subcutaneous plane in the  lateral left gluteal region. Musculoskeletal: No recent displaced fractures are seen. There is first-degree anterolisthesis at the L5-S1 level. Degenerative changes are noted in lumbar spine with encroachment of neural foramina at multiple levels. Degenerative changes are noted in both hips. IMPRESSION: There is subcutaneous contusion and hematoma in lateral left gluteal region. Hematoma in this region measures 9.2 x 5.3 x 2.2 cm. No other  acute findings are seen in noncontrast CT chest, abdomen and pelvis. There is no mediastinal or retroperitoneal hematoma. There is no laceration in solid organs. There is no ascites or pneumoperitoneum. Increased interstitial markings with tree-in-bud appearance in posterior segment of right upper lobe suggesting possible chronic inflammation or scarring. There is small patchy interstitial infiltrate in anteromedial aspect of left upper lobe suggesting scarring or small focus of pneumonia. There are few nodular densities in left lung measuring up to 6 mm in size. Please take into consideration patient's age in regard to the following recommendation. Non-contrast chest CT at 3-6 months is recommended. If the nodules are stable at time of repeat CT, then future CT at 18-24 months (from today's scan) is considered optional for low-risk patients, but is recommended for high-risk patients. This recommendation follows the consensus statement: Guidelines for Management of Incidental Pulmonary Nodules Detected on CT Images: From the Fleischner Society 2017; Radiology 2017; 284:228-243. Enlarged liver and spleen. Diverticulosis of colon without signs of focal diverticulitis. Gastroesophageal reflux. Left renal stones. Renal cysts. There are few small exophytic lesions in the kidneys with density measurements higher than usual for simple cyst, possibly hemorrhagic or proteinaceous cysts. This finding has not changed significantly. Aortic arteriosclerosis. Coronary artery calcifications are  seen. Other findings as described in the body of the report. Electronically Signed   By: Elmer Picker M.D.   On: 07/21/2022 12:50   DG Hand Complete Left  Result Date: 07/20/2022 CLINICAL DATA:  Fall on outstretched hand. EXAM: LEFT HAND - COMPLETE 3+ VIEW COMPARISON:  None Available. FINDINGS: A comminuted angulated displaced fracture of the distal radius extending into the radiocarpal joint is again identified. And ulnar styloid fractures again identified. Carpal bones are intact. Degenerative changes are identified between the base of the first metacarpal and trapezium. Degenerative changes are identified throughout the interphalangeal joints most prominent in the thumb. No fractures are identified in the hand. IMPRESSION: 1. Comminuted angulated displaced fracture of the distal radius extending into the radiocarpal joint. Ulnar styloid fracture. 2. Degenerative changes as above. 3. No fractures in the hand. Electronically Signed   By: Dorise Bullion III M.D.   On: 07/20/2022 16:50   DG Wrist Complete Left  Result Date: 07/20/2022 CLINICAL DATA:  Pain after fall EXAM: LEFT WRIST - COMPLETE 3+ VIEW COMPARISON:  None Available. FINDINGS: There is a displaced and angulated comminuted fracture through the distal radius. There appears to be extension into the radiocarpal joint. There is an ulnar styloid fracture. No wrist dislocation. No other abnormalities. IMPRESSION: 1. Displaced and angulated comminuted fracture through the distal radius. There appears to be extension into the radiocarpal joint. 2. Ulnar styloid fracture. Electronically Signed   By: Dorise Bullion III M.D.   On: 07/20/2022 16:48   DG Foot Complete Left  Result Date: 07/20/2022 CLINICAL DATA:  Fall.  Swelling. EXAM: LEFT FOOT - COMPLETE 3+ VIEW COMPARISON:  None Available. FINDINGS: There is a hallux valgus deformity. No fractures identified in the left foot. IMPRESSION: No fractures identified in the left foot. No acute  abnormality in the left foot. Lateral malleolar soft tissue swelling with a distal fibular tip fracture better appreciated on the ankle x-rays. Electronically Signed   By: Dorise Bullion III M.D.   On: 07/20/2022 16:47   DG Ankle Complete Left  Result Date: 07/20/2022 CLINICAL DATA:  Pain after twisting ankle EXAM: LEFT ANKLE COMPLETE - 3+ VIEW COMPARISON:  None Available. FINDINGS: There is a fracture through the distal tip  of the fibula with overlying soft tissue swelling. The ankle mortise is intact. No other fractures are identified. No other acute abnormalities. IMPRESSION: Fracture through the distal tip of the fibula with overlying soft tissue swelling. Electronically Signed   By: Dorise Bullion III M.D.   On: 07/20/2022 16:45    Procedures Procedures   CRITICAL CARE Performed by: Charlesetta Shanks   Total critical care time: 30 minutes  Critical care time was exclusive of separately billable procedures and treating other patients.  Critical care was necessary to treat or prevent imminent or life-threatening deterioration.  Critical care was time spent personally by me on the following activities: development of treatment plan with patient and/or surrogate as well as nursing, discussions with consultants, evaluation of patient's response to treatment, examination of patient, obtaining history from patient or surrogate, ordering and performing treatments and interventions, ordering and review of laboratory studies, ordering and review of radiographic studies, pulse oximetry and re-evaluation of patient's condition.  Medications Ordered in ED Medications  0.9 %  sodium chloride infusion (Manually program via Guardrails IV Fluids) (0 mLs Intravenous Hold 07/21/22 1219)  HYDROcodone-acetaminophen (NORCO/VICODIN) 5-325 MG per tablet 1 tablet (1 tablet Oral Given 07/21/22 0945)  sodium chloride 0.9 % bolus 1,000 mL (0 mLs Intravenous Stopped 07/21/22 1151)  sodium chloride 0.9 % bolus 500  mL (0 mLs Intravenous Stopped 07/21/22 1233)  sodium zirconium cyclosilicate (LOKELMA) packet 10 g (10 g Oral Given 07/21/22 1150)  calcium gluconate 1 g/ 50 mL sodium chloride IVPB (0 mg Intravenous Stopped 07/21/22 1435)  morphine (PF) 2 MG/ML injection 2 mg (2 mg Intravenous Given 07/21/22 1503)    ED Course/ Medical Decision Making/ A&P                           Medical Decision Making Amount and/or Complexity of Data Reviewed Labs: ordered. Radiology: ordered.  Risk Prescription drug management. Decision regarding hospitalization.  Patient has had increased general weakness since yesterday.  At baseline she has myelodysplastic syndrome with chronic anemia and thrombocytopenia.  Patient has not been requiring active treatment but she does note that her blood values have been dropping but she has not been undergoing transfusions yet.  Patient is quite pale in appearance.  Will proceed with broad diagnostic evaluation to rule out acute on chronic anemia\occult intrathoracic or intra-abdominal injury\dehydration.  Patient's blood pressures are stable.  She is not significantly hypotensive or tachycardic.  Hemoglobin returns with a decrease of a little less than a milligram per deciliter which puts the patient at 6.4.  At this time consistent with symptomatic anemia with increased weakness with activity and objective pallor.  Will plan for transfusion.  CT scan shows a large gluteal hematoma but no intrathoracic or intra-abdominal bleeding, no evidence of solid organ injury.  Bladder is not distended or obstructive looking on CT.  No acute bony injuries identified.  At this time, with other significant intrathoracic or intra-abdominal injury ruled out, likely etiology of patient's general weakness is acute on chronic symptomatic anemia with AKI and mild to moderate hyperkalemia.  Patient's EKG reviewed by myself does show some T wave peaking.  For hyperkalemia initiated with Lokelma and  calcium gluconate.  Also fluid resuscitation initiated.  Blood is ordered for transfusion for symptomatic anemia.  Clinically patient has a clear mental status and is alert.  She is not exhibiting respiratory distress and blood pressures are stable.  Appropriate for admission. Consult: Dr. Posey Pronto [facility]  hospitalist for admission.        Final Clinical Impression(s) / ED Diagnoses Final diagnoses:  AKI (acute kidney injury) ([place])  Symptomatic anemia  General weakness    Rx / DC Orders ED Discharge Orders     None         Charlesetta Shanks, MD 07/21/22 1611

## 2022-07-21 NOTE — ED Notes (Signed)
Attempted to call pt son per pt and not answer and no VM.

## 2022-07-22 ENCOUNTER — Encounter: Payer: Self-pay | Admitting: Hematology

## 2022-07-22 DIAGNOSIS — D649 Anemia, unspecified: Secondary | ICD-10-CM | POA: Diagnosis not present

## 2022-07-22 LAB — COMPREHENSIVE METABOLIC PANEL
ALT: 14 U/L (ref 0–44)
AST: 25 U/L (ref 15–41)
Albumin: 3.3 g/dL — ABNORMAL LOW (ref 3.5–5.0)
Alkaline Phosphatase: 60 U/L (ref 38–126)
Anion gap: 7 (ref 5–15)
BUN: 44 mg/dL — ABNORMAL HIGH (ref 8–23)
CO2: 21 mmol/L — ABNORMAL LOW (ref 22–32)
Calcium: 8.3 mg/dL — ABNORMAL LOW (ref 8.9–10.3)
Chloride: 103 mmol/L (ref 98–111)
Creatinine, Ser: 2.06 mg/dL — ABNORMAL HIGH (ref 0.44–1.00)
GFR, Estimated: 22 mL/min — ABNORMAL LOW (ref >60)
Glucose, Bld: 133 mg/dL — ABNORMAL HIGH (ref 70–99)
Potassium: 5.2 mmol/L — ABNORMAL HIGH (ref 3.5–5.1)
Sodium: 131 mmol/L — ABNORMAL LOW (ref 135–145)
Total Bilirubin: 0.8 mg/dL (ref 0.3–1.2)
Total Protein: 6.4 g/dL — ABNORMAL LOW (ref 6.5–8.1)

## 2022-07-22 LAB — TYPE AND SCREEN
ABO/RH(D): O POS
Antibody Screen: NEGATIVE
Unit division: 0
Unit division: 0

## 2022-07-22 LAB — CBC
HCT: 26 % — ABNORMAL LOW (ref 36.0–46.0)
Hemoglobin: 8.5 g/dL — ABNORMAL LOW (ref 12.0–15.0)
MCH: 32 pg (ref 26.0–34.0)
MCHC: 32.7 g/dL (ref 30.0–36.0)
MCV: 97.7 fL (ref 80.0–100.0)
Platelets: 62 10*3/uL — ABNORMAL LOW (ref 150–400)
RBC: 2.66 MIL/uL — ABNORMAL LOW (ref 3.87–5.11)
RDW: 20.3 % — ABNORMAL HIGH (ref 11.5–15.5)
WBC: 5.3 10*3/uL (ref 4.0–10.5)
nRBC: 1.1 % — ABNORMAL HIGH (ref 0.0–0.2)

## 2022-07-22 LAB — BPAM RBC
Blood Product Expiration Date: 202401112359
Blood Product Expiration Date: 202401152359
ISSUE DATE / TIME: 202312171822
ISSUE DATE / TIME: 202312172146
Unit Type and Rh: 5100
Unit Type and Rh: 5100

## 2022-07-22 LAB — OSMOLALITY: Osmolality: 286 mOsm/kg (ref 275–295)

## 2022-07-22 MED ORDER — METHOCARBAMOL 1000 MG/10ML IJ SOLN
500.0000 mg | Freq: Three times a day (TID) | INTRAVENOUS | Status: DC | PRN
  Filled 2022-07-22: qty 5

## 2022-07-22 MED ORDER — SERTRALINE HCL 100 MG PO TABS
100.0000 mg | ORAL_TABLET | Freq: Every day | ORAL | Status: DC
  Administered 2022-07-22 – 2022-07-27 (×6): 100 mg via ORAL
  Filled 2022-07-22 (×6): qty 1

## 2022-07-22 MED ORDER — FLUTICASONE FUROATE-VILANTEROL 100-25 MCG/ACT IN AEPB
1.0000 | INHALATION_SPRAY | Freq: Every day | RESPIRATORY_TRACT | Status: DC
  Administered 2022-07-22 – 2022-07-27 (×5): 1 via RESPIRATORY_TRACT
  Filled 2022-07-22: qty 28

## 2022-07-22 MED ORDER — UMECLIDINIUM BROMIDE 62.5 MCG/ACT IN AEPB
1.0000 | INHALATION_SPRAY | Freq: Every day | RESPIRATORY_TRACT | Status: DC
  Administered 2022-07-22 – 2022-07-27 (×5): 1 via RESPIRATORY_TRACT
  Filled 2022-07-22: qty 7

## 2022-07-22 NOTE — Progress Notes (Signed)
Orthopedic Tech Progress Note Patient Details:  Alison Bridges 18-Aug-1928 [id number]  Patient ID: Alison Bridges, female   DOB: 05/15/1929, 88 y.o.   MRN: [id number] Patient noted to have a large amount of swelling in her wrist and hand. Current splint removed and a layer of tightly wrapped coban was found beneath it. I notified the attending MD of my findings and reached out to the ortho doctor to see what he would like for me to do next, awaiting response. In the mean time I have elevated the patients arm using 3 pillows to help reduce the swelling before a new splint is applied. Vernona Rieger 07/22/2022, 12:50 PM

## 2022-07-22 NOTE — Progress Notes (Signed)
[facility] Progress Note Patient: Alison Bridges PNT:[id number] DOB: [birth year] DOA: 07/21/2022  DOS: the patient was seen and examined on 07/22/2022  Brief hospital course: Alison Bridges is a 88 y.o. female with PMH significant of MDS with pancytopenia, GERD, hypothyroidism, depression, CKD 3B presented to hospital with complaints of a fall.  Found to have AKI on CKD symptomatic anemia. Assessment and Plan: Acute blood loss anemia Traumatic in nature. Patient did have large wrist laceration which was repaired in the ER. Also has left gluteal hematoma of decent size. Patient is not on any anticoagulation. On admission hemoglobin was 6.4.  After 2 PRBC transfusion currently appropriately elevated and stable.   Acute kidney injury on CKD 3B. Hyperkalemia. EKG does show evidence of some minimal T wave peaking. Potassium level 5.2. Serum creatinine 2.4 baseline around 1.4. Continue with IV fluids. Monitor on telemetry.   Left wrist fracture with a laceration. Significant gapping wound with exposure of some underlying structures without any communication with the fracture per EDP note. 16 sutures were used to close the ulnar side wound. EDP discussed with Dr. Greta Doom on call yesterday and recommendation was for outpatient follow-up. I personally discussed with Dr. Greta Doom again on 12/18. Recommend Ortho tech to replace the splint-which was completed on 12/18. Also recommend outpatient follow-up on Thursday unless patient has any evidence of infection in the hospital. I will initiate her on oral antibiotics.   Distal fibular fracture on the left. Per EDP's discussion with the orthopedics, weight-bear as tolerated. Treated conservatively.   Left gluteal hematoma. Currently will monitor. If the hemoglobin does not respond will prefer nonweightbearing and rest.   Myelodysplastic syndrome. Pancytopenia. Follows up with Dr. Irene Limbo.  Baseline platelets ranges between  50-70,000 Baseline hemoglobin ranges around 8 And baseline WBC is around 2 or less Currently platelets are stable.  Hemoglobin is 5.3.  Receiving PRBC transfusion.  Will monitor.   Goals of care conversation. Patient prefers to be DNR/DNI understand the meaning of it. Will monitor.   Subjective: No nausea no vomiting.  Reports severe pain on her left upper extremity.  No fever no chills.  Physical Exam: General: in severe distress;  Cardiovascular: S1 and S2 Present, no Murmur Respiratory: normal respiratory effort, Bilateral Air entry present, no Crackles, no wheezes Abdomen: Bowel Sound present, Non tender  Extremities: Left lower extremity edema, left leg wrapped Neurology: alert and oriented to time, place, and person   Data Reviewed: I have Reviewed nursing notes, Vitals, and Lab results. Since last encounter, pertinent lab results CBC and BMP   . I have ordered test including CBC and BMP  . I have discussed pt's care plan and test results with hand surgery Dr. Greta Doom  .   Disposition: Status is: Inpatient Remains inpatient appropriate because: Need for IV fluids for AKI  SCDs Start: 07/21/22 1655 Place TED hose Start: 07/21/22 1655   Family Communication: No one at bedside Level of care: Telemetry continue telemetry Vitals:   07/22/22 0015 07/22/22 0045 07/22/22 0422 07/22/22 1344  BP: (!) 142/57 134/82 (!) 140/61 (!) 139/53  Pulse: 98 97 93 94  Resp: '15 16 16 16  '$ Temp: 98.4 F (36.9 C) 98.4 F (36.9 C) 98.4 F (36.9 C) 98 F (36.7 C)  TempSrc: Oral Oral Oral Oral  SpO2: 92% 94% 92% 94%  Weight:      Height:         Author: Berle Mull, MD 07/22/2022 6:31 PM  Please look on [email or web address] to find  out who is on call.

## 2022-07-22 NOTE — Hospital Course (Signed)
Alison Bridges is a 88 y.o. female with PMH significant of MDS with pancytopenia, GERD, hypothyroidism, depression, CKD 3B presented to hospital with complaints of a fall.  Found to have AKI on CKD symptomatic anemia.

## 2022-07-22 NOTE — Progress Notes (Signed)
Chaplain offered prayer over Alison Bridges, who desired prayer for healing and her family.    07/22/22 1400  Clinical Encounter Type  Visited With Patient  Visit Type Initial;Spiritual support  Referral From Patient  Consult/Referral To Chaplain  Spiritual Encounters  Spiritual Needs Prayer

## 2022-07-22 NOTE — Progress Notes (Signed)
Orthopedic Tech Progress Note Patient Details:  AASHRITHA MIEDEMA 1928-12-29 [id number]  Ortho Devices Type of Ortho Device: Post (short arm) splint Ortho Device/Splint Location: LUE Ortho Device/Splint Interventions: Ordered, Application, Adjustment   Post Interventions Patient Tolerated: Well Instructions Provided: Care of device Well padded, short arm splint applied per request of ortho MD. Encouraged patient to continue elevating arm, as the swelling has slightly decreased since removing her previous splint this morning. Her fingers, hand, and forearm area are still quite bruised, however both the attending MD and ortho MD are aware.  Vernona Rieger 07/22/2022, 4:41 PM

## 2022-07-23 DIAGNOSIS — D649 Anemia, unspecified: Secondary | ICD-10-CM | POA: Diagnosis not present

## 2022-07-23 LAB — MAGNESIUM: Magnesium: 1.9 mg/dL (ref 1.7–2.4)

## 2022-07-23 LAB — CBC
HCT: 21.4 % — ABNORMAL LOW (ref 36.0–46.0)
Hemoglobin: 6.9 g/dL — CL (ref 12.0–15.0)
MCH: 32.1 pg (ref 26.0–34.0)
MCHC: 32.2 g/dL (ref 30.0–36.0)
MCV: 99.5 fL (ref 80.0–100.0)
Platelets: 58 10*3/uL — ABNORMAL LOW (ref 150–400)
RBC: 2.15 MIL/uL — ABNORMAL LOW (ref 3.87–5.11)
RDW: 21.6 % — ABNORMAL HIGH (ref 11.5–15.5)
WBC: 2.8 10*3/uL — ABNORMAL LOW (ref 4.0–10.5)
nRBC: 2.9 % — ABNORMAL HIGH (ref 0.0–0.2)

## 2022-07-23 LAB — PREPARE RBC (CROSSMATCH)

## 2022-07-23 LAB — BASIC METABOLIC PANEL
Anion gap: 6 (ref 5–15)
BUN: 34 mg/dL — ABNORMAL HIGH (ref 8–23)
CO2: 19 mmol/L — ABNORMAL LOW (ref 22–32)
Calcium: 7.4 mg/dL — ABNORMAL LOW (ref 8.9–10.3)
Chloride: 108 mmol/L (ref 98–111)
Creatinine, Ser: 1.59 mg/dL — ABNORMAL HIGH (ref 0.44–1.00)
GFR, Estimated: 30 mL/min — ABNORMAL LOW (ref >60)
Glucose, Bld: 106 mg/dL — ABNORMAL HIGH (ref 70–99)
Potassium: 4 mmol/L (ref 3.5–5.1)
Sodium: 133 mmol/L — ABNORMAL LOW (ref 135–145)

## 2022-07-23 MED ORDER — SODIUM CHLORIDE 0.9% IV SOLUTION: Freq: Once | INTRAVENOUS | Status: AC

## 2022-07-23 NOTE — Progress Notes (Signed)
[facility] Progress Note Patient: Alison Bridges NAT:[id number] DOB: May 21, 1929 DOA: 07/21/2022  DOS: the patient was seen and examined on 07/23/2022  Brief hospital course: Alison Bridges is a 88 y.o. female with PMH significant of MDS with pancytopenia, GERD, hypothyroidism, depression, CKD 3B presented to hospital with complaints of a fall.  Found to have AKI on CKD symptomatic anemia.  Assessment and Plan: Acute blood loss anemia Traumatic in nature. Patient did have large wrist laceration which was repaired in the ER. Also has left gluteal hematoma of decent size. Patient is not on any anticoagulation. On admission hemoglobin was 6.4.  After 2 PRBC transfusion came up to 8. Currently low again, likely due to dilution.  Receiving 1 more PRBC transfusion on 12/19.   Acute kidney injury on CKD 3B. Hyperkalemia. EKG does show evidence of some minimal T wave peaking. On admission, serum creatinine 2.4 baseline around 1.4. Potassium currently normalized.  Continue with IV fluids. Monitor on telemetry.   Left wrist fracture with a laceration. Significant gapping wound with exposure of some underlying structures without any communication with the fracture per EDP note. 16 sutures were used to close the ulnar side wound. EDP discussed with Dr. Greta Doom on call on visit on 12/16 and recommendation was for outpatient follow-up. I personally discussed with Dr. Greta Doom again on 12/18. Based on the discussion orthotic replaced the splint on 12/18. Still has tightly wrapped [place] with resultant swelling of the hand but no noted numbness. Dr. Greta Doom recommended outpatient follow-up on Thursday unless patient has any evidence of infection in the hospital. Continue on oral antibiotics.  Distal fibular fracture on the left. Per EDP's discussion with the orthopedics, weight-bear as tolerated. Treated conservatively.  Outpatient follow-up with [facility] orth[facility]ics who manages fibular  fracture.   Left gluteal hematoma. Currently will monitor. If the hemoglobin does not respond will prefer nonweightbearing and rest.   Myelodysplastic syndrome. Pancytopenia. Follows up with Dr. Irene Limbo.  Baseline platelets ranges between 50-70,000 Baseline hemoglobin ranges around 8 And baseline WBC is around 2 or less Currently platelets are stable. Hemoglobin is low.  Receiving blood transfusion.   Goals of care conversation. Patient prefers to be DNR/DNI understand the meaning of it. Will monitor.   Subjective: Pain well-controlled.  Appears somewhat disoriented compared to yesterday when she was completely clear. No nausea no vomiting.  No fever no chills.  Physical Exam: General: Appear in mild distress; Cardiovascular: S1 and S2 Present, no Murmur, Respiratory: good respiratory effort, Bilateral Air entry present, CTA, no Crackles, no wheezes Abdomen: Bowel Sound present, Non tender  Extremities: no Pedal edema, left upper extremity edema noted. Neurology: alert and oriented to time, place, and person   Data Reviewed: I have Reviewed nursing notes, Vitals, and Lab results. Since last encounter, pertinent lab results CBC and BMP   . I have ordered test including CBC and BMP  .    Disposition: Status is: Inpatient Remains inpatient appropriate because: Need for IV fluids for AKI and blood transfusion  SCDs Start: 07/21/22 1655 Place TED hose Start: 07/21/22 1655   Family Communication: No one at bedside Level of care: Telemetry continue telemetry Vitals:   07/23/22 1503 07/23/22 1526 07/23/22 1745 07/23/22 2012  BP: (!) 132/51 (!) 143/63 (!) 158/67 (!) 143/92  Pulse: (!) 102 97 97 99  Resp: '16 16 18 16  '$ Temp: 98.4 F (36.9 C) 99 F (37.2 C) 98 F (36.7 C) 98.4 F (36.9 C)  TempSrc: Oral  Oral Oral  SpO2: 91%  94% 94%  Weight:      Height:         Author: Berle Mull, MD 07/23/2022 8:32 PM  Please look on [email or web address] to find out who is on call.

## 2022-07-23 NOTE — Progress Notes (Signed)
PT Cancellation Note  Patient Details Name: Alison Bridges MRN: [id number] DOB: May 11, 1929   Cancelled Treatment:     PT order received but eval deferred this date - pt with Hgb 6.9 with transfusion ordered.  Will follow.   Murray Durrell 07/23/2022, 2:11 PM

## 2022-07-24 DIAGNOSIS — E440 Moderate protein-calorie malnutrition: Secondary | ICD-10-CM | POA: Insufficient documentation

## 2022-07-24 DIAGNOSIS — D649 Anemia, unspecified: Secondary | ICD-10-CM | POA: Diagnosis not present

## 2022-07-24 LAB — BASIC METABOLIC PANEL
Anion gap: 5 (ref 5–15)
BUN: 26 mg/dL — ABNORMAL HIGH (ref 8–23)
CO2: 19 mmol/L — ABNORMAL LOW (ref 22–32)
Calcium: 7.5 mg/dL — ABNORMAL LOW (ref 8.9–10.3)
Chloride: 106 mmol/L (ref 98–111)
Creatinine, Ser: 1.36 mg/dL — ABNORMAL HIGH (ref 0.44–1.00)
GFR, Estimated: 37 mL/min — ABNORMAL LOW (ref >60)
Glucose, Bld: 116 mg/dL — ABNORMAL HIGH (ref 70–99)
Potassium: 3.5 mmol/L (ref 3.5–5.1)
Sodium: 130 mmol/L — ABNORMAL LOW (ref 135–145)

## 2022-07-24 LAB — CBC
HCT: 25.5 % — ABNORMAL LOW (ref 36.0–46.0)
Hemoglobin: 8.1 g/dL — ABNORMAL LOW (ref 12.0–15.0)
MCH: 31.3 pg (ref 26.0–34.0)
MCHC: 31.8 g/dL (ref 30.0–36.0)
MCV: 98.5 fL (ref 80.0–100.0)
Platelets: 46 10*3/uL — ABNORMAL LOW (ref 150–400)
RBC: 2.59 MIL/uL — ABNORMAL LOW (ref 3.87–5.11)
RDW: 21.1 % — ABNORMAL HIGH (ref 11.5–15.5)
WBC: 1.8 10*3/uL — ABNORMAL LOW (ref 4.0–10.5)
nRBC: 7.1 % — ABNORMAL HIGH (ref 0.0–0.2)

## 2022-07-24 LAB — TYPE AND SCREEN
ABO/RH(D): O POS
Antibody Screen: NEGATIVE
Unit division: 0

## 2022-07-24 LAB — BPAM RBC
Blood Product Expiration Date: 202312252359
ISSUE DATE / TIME: 202312191506
Unit Type and Rh: 5100

## 2022-07-24 LAB — MAGNESIUM: Magnesium: 1.7 mg/dL (ref 1.7–2.4)

## 2022-07-24 MED ORDER — GUAIFENESIN 100 MG/5ML PO LIQD: 5.0000 mL | ORAL | Status: DC | PRN

## 2022-07-24 MED ORDER — IPRATROPIUM-ALBUTEROL 0.5-2.5 (3) MG/3ML IN SOLN: 3.0000 mL | RESPIRATORY_TRACT | Status: DC | PRN

## 2022-07-24 MED ORDER — ENSURE ENLIVE PO LIQD
237.0000 mL | Freq: Two times a day (BID) | ORAL | Status: DC
  Administered 2022-07-24 – 2022-07-26 (×3): 237 mL via ORAL

## 2022-07-24 MED ORDER — ONDANSETRON HCL 4 MG/2ML IJ SOLN: 4.0000 mg | Freq: Four times a day (QID) | INTRAMUSCULAR | Status: DC | PRN

## 2022-07-24 MED ORDER — ACETAMINOPHEN 325 MG PO TABS
650.0000 mg | ORAL_TABLET | Freq: Four times a day (QID) | ORAL | Status: DC | PRN
  Administered 2022-07-25 (×2): 650 mg via ORAL
  Filled 2022-07-24 (×3): qty 2

## 2022-07-24 MED ORDER — TRAZODONE HCL 50 MG PO TABS
50.0000 mg | ORAL_TABLET | Freq: Every evening | ORAL | Status: DC | PRN
  Administered 2022-07-26: 50 mg via ORAL
  Filled 2022-07-24: qty 1

## 2022-07-24 MED ORDER — METOPROLOL TARTRATE 5 MG/5ML IV SOLN: 5.0000 mg | INTRAVENOUS | Status: DC | PRN

## 2022-07-24 NOTE — Progress Notes (Signed)
Initial Nutrition Assessment  DOCUMENTATION CODES:   Non-severe (moderate) malnutrition in context of chronic illness  INTERVENTION:   -Ensure Plus High Protein po BID, each supplement provides 350 kcal and 20 grams of protein.   NUTRITION DIAGNOSIS:   Moderate Malnutrition related to chronic illness (MDS) as evidenced by moderate fat depletion, severe muscle depletion.  GOAL:   Patient will meet greater than or equal to 90% of their needs  MONITOR:   PO intake, Supplement acceptance, Weight trends, Labs, I & O's  REASON FOR ASSESSMENT:   Malnutrition Screening Tool    ASSESSMENT:   88 y.o. female with PMH significant of MDS with pancytopenia, GERD, hypothyroidism, depression, CKD 3B presented to hospital with complaints of a fall.  Found to have AKI on CKD symptomatic anemia.  Patient in room, sitting in chair. Pt with left wrist fracture and left distal fibular fracture following a fall at home. Pt states she has pain on her left side. Assisted her with calling someone on her phone prior to leaving.  Pt states she has a poor appetite, this has been for a while, she is being treated for MDS.  States she is trying to eat despite not feeling hungry. Ate some eggs, bacon, OJ and coffee this morning for breakfast. Denies any issues with chewing or swallowing. Pt drinks ~1 Ensure daily at home, likes vanilla. Will order for additional kcals and protein to support healing.   Pt reports UBW ~127-128 lbs. Per weight records, pt has lost 5 lb since 7/11 (3% wt loss x 5 months, insignificant for time frame).   Medications: B complex w/ Vitamin C   Labs reviewed: Low Na   NUTRITION - FOCUSED PHYSICAL EXAM:  Flowsheet Row Most Recent Value  Orbital Region Moderate depletion  Upper Arm Region Severe depletion  [rt side, left side in pain]  Thoracic and Lumbar Region Moderate depletion  Buccal Region Moderate depletion  Temple Region Moderate depletion  Clavicle Bone Region  Severe depletion  Clavicle and Acromion Bone Region Severe depletion  Scapular Bone Region Severe depletion  Dorsal Hand Severe depletion  [right hand]  Patellar Region Severe depletion  [right leg]  Anterior Thigh Region Severe depletion  Posterior Calf Region Severe depletion  Edema (RD Assessment) None  Hair Reviewed  Eyes Reviewed  Mouth Reviewed  Skin Reviewed  Nails Unable to assess       Diet Order:   Diet Order             Diet regular Fluid consistency: Thin  Diet effective now                   EDUCATION NEEDS:   No education needs have been identified at this time  Skin:  Skin Assessment: Skin Integrity Issues: Skin Integrity Issues:: Other (Comment) Other: left arm laceration  Last BM:  12/16  Height:   Ht Readings from Last 1 Encounters:  07/21/22 '5\' 8"'$  (1.727 m)    Weight:   Wt Readings from Last 1 Encounters:  07/21/22 56.2 kg    BMI:  Body mass index is 18.85 kg/m.  Estimated Nutritional Needs:   Kcal:  1450-1650  Protein:  70-85g  Fluid:  1.7L/day  Clayton Bibles, MS, RD, LDN Inpatient Clinical Dietitian Contact information available via Amion

## 2022-07-24 NOTE — Evaluation (Signed)
Physical Therapy Evaluation Patient Details Name: ARRETTA TOENJES MRN: [id number] DOB: 02-23-1929 Today's Date: 07/24/2022  History of Present Illness  88 y.o. female with PMH significant of MDS with pancytopenia, GERD, hypothyroidism, depression, CKD 3B presented to hospital with complaints of a fall.  Found to have AKI on CKD, acute blood loss anemia requiring transfusion, left wrist laceration/fracture, distal fibular fracture, left gluteal hematoma.  Clinical Impression    Pt admitted with above diagnosis.  Pt currently with functional limitations due to the deficits listed below (see PT Problem List). Pt will benefit from skilled PT to increase their independence and safety with mobility to allow discharge to the venue listed below.   The patient  tolerated mobilizing with 2 person assist from bed to [facility] then recliner.  Attempted use of Platform Rw, did not support well. Patient will benefit from SNF     Recommendations for follow up therapy are one component of a multi-disciplinary discharge planning process, led by the attending physician.  Recommendations may be updated based on patient status, additional functional criteria and insurance authorization.  Follow Up Recommendations Skilled nursing-short term rehab (<3 hours/day) Can patient physically be transported by private vehicle: No    Assistance Recommended at Discharge Frequent or constant Supervision/Assistance  Patient can return home with the following  A lot of help with walking and/or transfers;A lot of help with bathing/dressing/bathroom;Assist for transportation    Equipment Recommendations Rolling walker (2 wheels)  Recommendations for Other Services       Functional Status Assessment Patient has had a recent decline in their functional status and demonstrates the ability to make significant improvements in function in a reasonable and predictable amount of time.     Precautions / Restrictions  Precautions Precautions: Fall Precaution Comments: L wrist fx, left elbow swelling/tenderson from fall Restrictions Weight Bearing Restrictions: Yes LLE Weight Bearing: Weight bearing as tolerated Other Position/Activity Restrictions: with CAM boot      Mobility  Bed Mobility         Supine to sit: Mod assist, HOB elevated     General bed mobility comments: extra time,    Transfers Overall transfer level: Needs assistance Equipment used: Left platform walker, 1 person hand held assist Transfers: Sit to/from Stand, Bed to chair/wheelchair/BSC Sit to Stand: Min assist, Mod assist   Step pivot transfers: Mod assist, +2 safety/equipment, +2 physical assistance       General transfer comment: Min assist to stand and place LUE in cradle of RW, Unsteady steps to turn to [facility]. Stood again with RUE on bed rail x 2, then able  to step to recliner, supported on bed rail and +1 HHA    Ambulation/Gait                  Stairs            Wheelchair Mobility    Modified Rankin (Stroke Patients Only)       Balance Overall balance assessment: Needs assistance, History of Falls Sitting-balance support: No upper extremity supported, Feet supported Sitting balance-Leahy Scale: Fair     Standing balance support: During functional activity, Reliant on assistive device for balance, Bilateral upper extremity supported Standing balance-Leahy Scale: Poor                               Pertinent Vitals/Pain Pain Assessment Pain Score: 4  Pain Location: L wrist, left ankle Pain Descriptors / Indicators:  Aching Pain Intervention(s): Premedicated before session    Home Living Family/patient expects to be discharged to:: Skilled nursing facility                   Additional Comments: From [place]    Prior Function Prior Level of Function : Independent/Modified Independent             Mobility Comments: ind, has cane but does not  yet ADLs Comments: independent, stands in shower     Hand Dominance   Dominant Hand: Right    Extremity/Trunk Assessment   Upper Extremity Assessment Upper Extremity Assessment: RUE deficits/detail;LUE deficits/detail RUE Deficits / Details: WFL ROM and strength RUE Sensation: WNL RUE Coordination: WNL LUE Deficits / Details: grossly functonal shoulder and elbow ROM - patient guarding LUE: Unable to fully assess due to immobilization    Lower Extremity Assessment Lower Extremity Assessment: RLE deficits/detail;LLE deficits/detail LLE Deficits / Details: Walker splint in place, readjusted, edema of the ankle    Cervical / Trunk Assessment Cervical / Trunk Assessment: Normal  Communication   Communication: No difficulties  Cognition Arousal/Alertness: Awake/alert Behavior During Therapy: WFL for tasks assessed/performed Overall Cognitive Status: Within Functional Limits for tasks assessed                                          General Comments      Exercises     Assessment/Plan    PT Assessment Patient needs continued PT services  PT Problem List Decreased strength;Decreased knowledge of precautions;Decreased mobility;Decreased range of motion;Decreased knowledge of use of DME;Decreased activity tolerance;Decreased safety awareness;Pain       PT Treatment Interventions DME instruction;Functional mobility training;Gait training;Therapeutic activities;Therapeutic exercise;Patient/family education    PT Goals (Current goals can be found in the Care Plan section)  Acute Rehab PT Goals Patient Stated Goal: go home PT Goal Formulation: With patient Time For Goal Achievement: 08/07/22 Potential to Achieve Goals: Fair    Frequency Min 3X/week     Co-evaluation PT/OT/SLP Co-Evaluation/Treatment: Yes Reason for Co-Treatment: To address functional/ADL transfers;For patient/therapist safety PT goals addressed during session: Mobility/safety with  mobility OT goals addressed during session: ADL's and self-care       AM-PAC PT "6 Clicks" Mobility  Outcome Measure Help needed turning from your back to your side while in a flat bed without using bedrails?: A Lot Help needed moving from lying on your back to sitting on the side of a flat bed without using bedrails?: A Lot Help needed moving to and from a bed to a chair (including a wheelchair)?: A Lot Help needed standing up from a chair using your arms (e.g., wheelchair or bedside chair)?: A Lot Help needed to walk in hospital room?: Total Help needed climbing 3-5 steps with a railing? : Total 6 Click Score: 10    End of Session Equipment Utilized During Treatment: Gait belt Activity Tolerance: Patient limited by fatigue Patient left: in chair;with call bell/phone within reach;with chair alarm set Nurse Communication: Mobility status PT Visit Diagnosis: Unsteadiness on feet (R26.81);Difficulty in walking, not elsewhere classified (R26.2);Pain Pain - Right/Left: Left Pain - part of body: Ankle and joints of foot    Time: 7322-0254 PT Time Calculation (min) (ACUTE ONLY): 39 min   Charges:   PT Evaluation $PT Eval Low Complexity: 1 Low PT Treatments $Therapeutic Activity: 8-22 mins  [place] Office [phone] Weekend pager-[phone]   Claretha Cooper 07/24/2022, 2:01 PM

## 2022-07-24 NOTE — Progress Notes (Signed)
PROGRESS NOTE    Alison Bridges  IRW:[id number] DOB: [birth year] DOA: 07/21/2022 PCP: Burnard Bunting, MD   Brief Narrative:  88 y.o. female with PMH significant of MDS with pancytopenia, GERD, hypothyroidism, depression, CKD 3B presented to hospital with complaints of a fall.  Found to have AKI on CKD, acute blood loss anemia requiring transfusion, left wrist laceration/fracture, distal fibular fracture, left gluteal hematoma.  Patient was started on IV fluids, transfusion.  Case was discussed with orthopedic who recommended conservative management and outpatient follow-up.   Assessment & Plan:  Principal Problem:   Symptomatic anemia   Acute blood loss anemia Thrombocytopenia Left gluteal hematoma Suspect traumatic in nature secondary to fall.  Also has left gluteal hematoma.  Patient is not on any long-term anticoagulation.  Admission hemoglobin 5.3, requiring PRBC transfusion.  Initially improved to 8.5 now drifted down again.  Hemoglobin today 8.1  2 units PRBC transfused 12/17 1 unit PRBC transfused 12/19   Acute kidney injury on CKD 3B. Hyperkalemia. Admission creatinine 2.4, this is improved with IV fluids.  Creatinine today 1.3.   Left wrist fracture with a laceration. Distal fibular fracture on the left side -Currently 16 sutures are in place.  Case was discussed by EDP with Dr. Greta Doom from [facility] who recommended splint placement of the left wrist and outpatient follow-up.  Also for the left distal fibular fracture, weightbearing as tolerated.     Myelodysplastic syndrome. Pancytopenia. Follows up with Dr. Irene Limbo.    Goals of care conversation. Patient prefers to be DNR/DNI understand the meaning of it. Will monitor.  PT/OT-pending   DVT prophylaxis: SCDs Start: 07/21/22 1655 Place TED hose Start: 07/21/22 1655  Code Status: DNR Family Communication: Family at bedside  Status is: Inpatient Medically doing better but suspect she will require SNF  placement    Subjective: Seen and examined at bedside.  Feels very weak overall.  No other complaints.  Denies any obvious signs of bleeding   Examination:  General exam: Appears calm and comfortable, elderly for now.  Bilateral temporal wasting and cachectic. Respiratory system: Clear to auscultation. Respiratory effort normal. Cardiovascular system: S1 & S2 heard, RRR. No JVD, murmurs, rubs, gallops or clicks. No pedal edema. Gastrointestinal system: Abdomen is nondistended, soft and nontender. No organomegaly or masses felt. Normal bowel sounds heard. Central nervous system: Alert and oriented. No focal neurological deficits. Extremities: Symmetric 5 x 5 power. Skin: No rashes, lesions or ulcers Psychiatry: Judgement and insight appear normal. Mood & affect appropriate.     Objective: Vitals:   07/23/22 1745 07/23/22 2012 07/24/22 0526 07/24/22 0818  BP: (!) 158/67 (!) 143/92 (!) 149/63   Pulse: 97 99 86   Resp: '18 16 18   '$ Temp: 98 F (36.7 C) 98.4 F (36.9 C) 98.1 F (36.7 C)   TempSrc: Oral Oral Oral   SpO2: 94% 94% 95% 94%  Weight:      Height:        Intake/Output Summary (Last 24 hours) at 07/24/2022 0829 Last data filed at 07/24/2022 0500 Gross per 24 hour  Intake 604.17 ml  Output 3400 ml  Net -2795.83 ml   Filed Weights   07/21/22 0913  Weight: 56.2 kg     Data Reviewed:   CBC: Recent Labs  Lab 07/17/22 0955 07/20/22 1701 07/21/22 0930 07/21/22 1810 07/22/22 0324 07/23/22 0755  WBC 1.7* 1.9* 5.8 4.8 5.3 2.8*  NEUTROABS 0.5* 0.6* 2.1 1.8  --   --   HGB 8.3* 7.3* 6.4* 5.3*  8.5* 6.9*  HCT 26.2* 24.0* 20.8* 17.1* 26.0* 21.4*  MCV 104.8* 106.2* 107.8* 108.2* 97.7 99.5  PLT 57* 74* 74* 67* 62* 58*   Basic Metabolic Panel: Recent Labs  Lab 07/21/22 0930 07/21/22 0958 07/21/22 1810 07/22/22 0324 07/23/22 0755 07/24/22 0653  NA 129*  --  130* 131* 133* 130*  K 5.9*  --  5.6* 5.2* 4.0 3.5  CL 98  --  102 103 108 106  CO2 17*  --  18*  21* 19* 19*  GLUCOSE 130*  --  127* 133* 106* 116*  BUN 46*  --  48* 44* 34* 26*  CREATININE 2.49*  --  2.44* 2.06* 1.59* 1.36*  CALCIUM 8.7*  --  8.5* 8.3* 7.4* 7.5*  MG  --  2.1  --   --  1.9 1.7   GFR: Estimated Creatinine Clearance: 23.9 mL/min (A) (by C-G formula based on SCr of 1.36 mg/dL (H)). Liver Function Tests: Recent Labs  Lab 07/17/22 0955 07/21/22 0930 07/22/22 0324  AST 17 37 25  ALT '12 19 14  '$ ALKPHOS 81 64 60  BILITOT 0.5 0.5 0.8  PROT 6.9 6.8 6.4*  ALBUMIN 4.0 3.6 3.3*   No results for input(s): "LIPASE", "AMYLASE" in the last 168 hours. No results for input(s): "AMMONIA" in the last 168 hours. Coagulation Profile: No results for input(s): "INR", "PROTIME" in the last 168 hours. Cardiac Enzymes: No results for input(s): "CKTOTAL", "CKMB", "CKMBINDEX", "TROPONINI" in the last 168 hours. BNP (last 3 results) No results for input(s): "PROBNP" in the last 8760 hours. HbA1C: No results for input(s): "HGBA1C" in the last 72 hours. CBG: No results for input(s): "GLUCAP" in the last 168 hours. Lipid Profile: No results for input(s): "CHOL", "HDL", "LDLCALC", "TRIG", "CHOLHDL", "LDLDIRECT" in the last 72 hours. Thyroid Function Tests: No results for input(s): "TSH", "T4TOTAL", "FREET4", "T3FREE", "THYROIDAB" in the last 72 hours. Anemia Panel: No results for input(s): "VITAMINB12", "FOLATE", "FERRITIN", "TIBC", "IRON", "RETICCTPCT" in the last 72 hours. Sepsis Labs: No results for input(s): "PROCALCITON", "LATICACIDVEN" in the last 168 hours.  Recent Results (from the past 240 hour(s))  Resp panel by RT-PCR (RSV, Flu A&B, Covid) Anterior Nasal Swab     Status: None   Collection Time: 07/21/22  9:30 AM   Specimen: Anterior Nasal Swab  Result Value Ref Range Status   SARS Coronavirus 2 by RT PCR NEGATIVE NEGATIVE Final    Comment: (NOTE) SARS-CoV-2 target nucleic acids are NOT DETECTED.  The SARS-CoV-2 RNA is generally detectable in upper  respiratory specimens during the acute phase of infection. The lowest concentration of SARS-CoV-2 viral copies this assay can detect is 138 copies/mL. A negative result does not preclude SARS-Cov-2 infection and should not be used as the sole basis for treatment or other patient management decisions. A negative result may occur with  improper specimen collection/handling, submission of specimen other than nasopharyngeal swab, presence of viral mutation(s) within the areas targeted by this assay, and inadequate number of viral copies(<138 copies/mL). A negative result must be combined with clinical observations, patient history, and epidemiological information. The expected result is Negative.  Fact Sheet for Patients:  EntrepreneurPulse.com.au  Fact Sheet for Healthcare Providers:  IncredibleEmployment.be  This test is no t yet approved or cleared by the [place] FDA and  has been authorized for detection and/or diagnosis of SARS-CoV-2 by FDA under an Emergency Use Authorization (EUA). This EUA will remain  in effect (meaning this test can be used) for the duration of  the COVID-19 declaration under Section 564(b)(1) of the Act, 21 U.S.C.section 360bbb-3(b)(1), unless the authorization is terminated  or revoked sooner.       Influenza A by PCR NEGATIVE NEGATIVE Final   Influenza B by PCR NEGATIVE NEGATIVE Final    Comment: (NOTE) The Xpert Xpress SARS-CoV-2/FLU/RSV plus assay is intended as an aid in the diagnosis of influenza from Nasopharyngeal swab specimens and should not be used as a sole basis for treatment. Nasal washings and aspirates are unacceptable for Xpert Xpress SARS-CoV-2/FLU/RSV testing.  Fact Sheet for Patients: EntrepreneurPulse.com.au  Fact Sheet for Healthcare Providers: IncredibleEmployment.be  This test is not yet approved or cleared by the [place] FDA and has been  authorized for detection and/or diagnosis of SARS-CoV-2 by FDA under an Emergency Use Authorization (EUA). This EUA will remain in effect (meaning this test can be used) for the duration of the COVID-19 declaration under Section 564(b)(1) of the Act, 21 U.S.C. section 360bbb-3(b)(1), unless the authorization is terminated or revoked.     Resp Syncytial Virus by PCR NEGATIVE NEGATIVE Final    Comment: (NOTE) Fact Sheet for Patients: EntrepreneurPulse.com.au  Fact Sheet for Healthcare Providers: IncredibleEmployment.be  This test is not yet approved or cleared by the [place] FDA and has been authorized for detection and/or diagnosis of SARS-CoV-2 by FDA under an Emergency Use Authorization (EUA). This EUA will remain in effect (meaning this test can be used) for the duration of the COVID-19 declaration under Section 564(b)(1) of the Act, 21 U.S.C. section 360bbb-3(b)(1), unless the authorization is terminated or revoked.  Performed at [facility], [place] [address] [place], [place] [address]          Radiology Studies: No results found.      Scheduled Meds:  sodium chloride   Intravenous Once   B-complex with vitamin C  1 tablet Oral Daily   cephALEXin  500 mg Oral Q12H   fluticasone furoate-vilanterol  1 puff Inhalation Daily   And   umeclidinium bromide  1 puff Inhalation Daily   guaiFENesin  600 mg Oral BID   levothyroxine  125 mcg Oral Q0600   sertraline  100 mg Oral Daily   sodium zirconium cyclosilicate  10 g Oral Daily   Continuous Infusions:  methocarbamol (ROBAXIN) IV       LOS: 3 days   Time spent= 35 mins    Mackynzie Woolford Arsenio Loader, MD [facility]  If 7PM-7AM, please contact night-coverage  07/24/2022, 8:29 AM

## 2022-07-24 NOTE — Evaluation (Signed)
Occupational Therapy Evaluation Patient Details Name: Alison Bridges MRN: [id number] DOB: Aug 11, 1928 Today's Date: 07/24/2022   History of Present Illness 88 y.o. female with PMH significant of MDS with pancytopenia, GERD, hypothyroidism, depression, CKD 3B presented to hospital with complaints of a fall.  Found to have AKI on CKD, acute blood loss anemia requiring transfusion, left wrist laceration/fracture, distal fibular fracture, left gluteal hematoma.   Clinical Impression   [name] is a 88 year old woman who presents with left wrist fracture and left distal fibular fracture in CAM boot. She presents with decreased activity tolerance, impaired balance and pain resulting in a sudden decline in functional abilities. Today patient needing assistance to transfer out of bed, stand and pivot. She needs max assist for LB ADLs and toileting and set up to mod assist for UB ADLs. Patient will benefit from skilled OT services while in hospital to improve deficits and learn compensatory strategies as needed in order to return to PLOF.  Recommend short term rehab at discharge.       Recommendations for follow up therapy are one component of a multi-disciplinary discharge planning process, led by the attending physician.  Recommendations may be updated based on patient status, additional functional criteria and insurance authorization.   Follow Up Recommendations  Skilled nursing-short term rehab (<3 hours/day)     Assistance Recommended at Discharge Frequent or constant Supervision/Assistance  Patient can return home with the following Two people to help with walking and/or transfers;A lot of help with bathing/dressing/bathroom;Assistance with cooking/housework;Help with stairs or ramp for entrance    Functional Status Assessment  Patient has had a recent decline in their functional status and demonstrates the ability to make significant improvements in function in a reasonable and predictable  amount of time.  Equipment Recommendations  None recommended by OT    Recommendations for Other Services       Precautions / Restrictions Precautions Precautions: Fall Precaution Comments: L wrist fx, left elbow swelling/tenderson from fall Restrictions Weight Bearing Restrictions: Yes LLE Weight Bearing: Weight bearing as tolerated Other Position/Activity Restrictions: with CAM boot      Mobility Bed Mobility Overal bed mobility: Needs Assistance Bed Mobility: Supine to Sit     Supine to sit: Mod assist, HOB elevated          Transfers Overall transfer level: Needs assistance Equipment used: None (platform walker) Transfers: Sit to/from Stand, Bed to chair/wheelchair/BSC Sit to Stand: Min assist Stand pivot transfers: Mod assist, +2 safety/equipment         General transfer comment: Min assist to rise. Patient unable to use platform walker effectively with UE - mod assist to pivot to [facility]. +2 needed for safety      Balance Overall balance assessment: Needs assistance Sitting-balance support: No upper extremity supported, Feet supported Sitting balance-Leahy Scale: Fair     Standing balance support: During functional activity, Reliant on assistive device for balance Standing balance-Leahy Scale: Poor                             ADL either performed or assessed with clinical judgement   ADL Overall ADL's : Needs assistance/impaired Eating/Feeding: Set up   Grooming: Set up;Sitting   Upper Body Bathing: Moderate assistance;Sitting   Lower Body Bathing: Moderate assistance;Sit to/from stand   Upper Body Dressing : Moderate assistance;Sitting   Lower Body Dressing: Maximal assistance;Sit to/from stand   Toilet Transfer: Minimal assistance;+2 for physical assistance;Stand-pivot;BSC/3in1;Rolling  walker (2 wheels)   Toileting- Clothing Manipulation and Hygiene: Maximal assistance;Sit to/from stand Toileting - Clothing Manipulation Details  (indicate cue type and reason): for clothing management. Can wipe in seated position.     Functional mobility during ADLs: Minimal assistance;+2 for safety/equipment       Vision Patient Visual Report: No change from baseline       Perception     Praxis      Pertinent Vitals/Pain Pain Assessment Pain Assessment: 0-10 Pain Score: 5  Pain Location: L wrist Pain Descriptors / Indicators: Aching Pain Intervention(s): Monitored during session, Patient requesting pain meds-RN notified     Hand Dominance Right   Extremity/Trunk Assessment Upper Extremity Assessment Upper Extremity Assessment: RUE deficits/detail;LUE deficits/detail RUE Deficits / Details: WFL ROM and strength RUE Sensation: WNL RUE Coordination: WNL LUE Deficits / Details: grossly functonal shoulder and elbow ROM - patient guarding LUE: Unable to fully assess due to immobilization   Lower Extremity Assessment Lower Extremity Assessment: Defer to PT evaluation   Cervical / Trunk Assessment Cervical / Trunk Assessment: Normal   Communication Communication Communication: No difficulties   Cognition Arousal/Alertness: Awake/alert Behavior During Therapy: WFL for tasks assessed/performed Overall Cognitive Status: Within Functional Limits for tasks assessed                                       General Comments       Exercises     Shoulder Instructions      Home Living Family/patient expects to be discharged to:: Skilled nursing facility                                 Additional Comments: From [facility]      Prior Functioning/Environment Prior Level of Function : Independent/Modified Independent               ADLs Comments: independent, stands in shower        OT Problem List: Decreased strength;Decreased range of motion;Decreased activity tolerance;Impaired balance (sitting and/or standing);Decreased knowledge of use of DME or AE;Decreased  knowledge of precautions;Pain;Impaired UE functional use;Increased edema      OT Treatment/Interventions: Self-care/ADL training;Therapeutic exercise;DME and/or AE instruction;Therapeutic activities;Balance training;Patient/family education;Modalities    OT Goals(Current goals can be found in the care plan section) Acute Rehab OT Goals Patient Stated Goal: to go to bathroom OT Goal Formulation: With patient Time For Goal Achievement: 08/07/22 Potential to Achieve Goals: Good  OT Frequency: Min 2X/week    Co-evaluation PT/OT/SLP Co-Evaluation/Treatment: Yes Reason for Co-Treatment: To address functional/ADL transfers;For patient/therapist safety PT goals addressed during session: Mobility/safety with mobility OT goals addressed during session: ADL's and self-care      AM-PAC OT "6 Clicks" Daily Activity     Outcome Measure Help from another person eating meals?: A Little Help from another person taking care of personal grooming?: A Little Help from another person toileting, which includes using toliet, bedpan, or urinal?: A Lot Help from another person bathing (including washing, rinsing, drying)?: A Lot Help from another person to put on and taking off regular upper body clothing?: A Lot Help from another person to put on and taking off regular lower body clothing?: A Lot 6 Click Score: 14   End of Session Equipment Utilized During Treatment: Gait belt;Rolling walker (2 wheels) Nurse Communication: Mobility status  Activity Tolerance:  Patient tolerated treatment well Patient left: in chair;with call bell/phone within reach;with chair alarm set  OT Visit Diagnosis: Other abnormalities of gait and mobility (R26.89);Pain                Time: 3074-6002 OT Time Calculation (min): 28 min Charges:  OT General Charges $OT Visit: 1 Visit OT Evaluation $OT Eval Low Complexity: 1 Low  Gustavo Lah, OTR/L [place]  Office [phone]   Lenward Chancellor 07/24/2022, 12:06 PM

## 2022-07-24 NOTE — Care Management Important Message (Signed)
Important Message  Patient Details IM Letter given. Name: Alison Bridges MRN: [id number] Date of Birth: [birth year]   Medicare Important Message Given:  Yes     Kerin Salen 07/24/2022, 9:25 AM

## 2022-07-25 DIAGNOSIS — D649 Anemia, unspecified: Secondary | ICD-10-CM | POA: Diagnosis not present

## 2022-07-25 LAB — BASIC METABOLIC PANEL
Anion gap: 6 (ref 5–15)
BUN: 25 mg/dL — ABNORMAL HIGH (ref 8–23)
CO2: 21 mmol/L — ABNORMAL LOW (ref 22–32)
Calcium: 7.7 mg/dL — ABNORMAL LOW (ref 8.9–10.3)
Chloride: 105 mmol/L (ref 98–111)
Creatinine, Ser: 1.33 mg/dL — ABNORMAL HIGH (ref 0.44–1.00)
GFR, Estimated: 38 mL/min — ABNORMAL LOW (ref >60)
Glucose, Bld: 110 mg/dL — ABNORMAL HIGH (ref 70–99)
Potassium: 3.5 mmol/L (ref 3.5–5.1)
Sodium: 132 mmol/L — ABNORMAL LOW (ref 135–145)

## 2022-07-25 LAB — CBC
HCT: 25 % — ABNORMAL LOW (ref 36.0–46.0)
Hemoglobin: 8 g/dL — ABNORMAL LOW (ref 12.0–15.0)
MCH: 31.7 pg (ref 26.0–34.0)
MCHC: 32 g/dL (ref 30.0–36.0)
MCV: 99.2 fL (ref 80.0–100.0)
Platelets: 47 10*3/uL — ABNORMAL LOW (ref 150–400)
RBC: 2.52 MIL/uL — ABNORMAL LOW (ref 3.87–5.11)
RDW: 20.6 % — ABNORMAL HIGH (ref 11.5–15.5)
WBC: 1.7 10*3/uL — ABNORMAL LOW (ref 4.0–10.5)
nRBC: 7.5 % — ABNORMAL HIGH (ref 0.0–0.2)

## 2022-07-25 LAB — MAGNESIUM: Magnesium: 1.7 mg/dL (ref 1.7–2.4)

## 2022-07-25 MED ORDER — POTASSIUM CHLORIDE CRYS ER 20 MEQ PO TBCR
40.0000 meq | EXTENDED_RELEASE_TABLET | Freq: Once | ORAL | Status: AC
  Administered 2022-07-25: 40 meq via ORAL
  Filled 2022-07-25: qty 2

## 2022-07-25 MED ORDER — MAGNESIUM OXIDE -MG SUPPLEMENT 400 (240 MG) MG PO TABS
800.0000 mg | ORAL_TABLET | Freq: Once | ORAL | Status: AC
  Administered 2022-07-25: 800 mg via ORAL
  Filled 2022-07-25: qty 2

## 2022-07-25 NOTE — Progress Notes (Signed)
Physical Therapy Treatment Patient Details Name: Alison Bridges MRN: [id number] DOB: 04/07/1929 Today's Date: 07/25/2022   History of Present Illness 88 y.o. female with PMH significant of MDS with pancytopenia, GERD, hypothyroidism, depression, CKD 3B presented to hospital with complaints of a fall.  Found to have AKI on CKD, acute blood loss anemia requiring transfusion, left wrist laceration/fracture, distal fibular fracture, left gluteal hematoma.    PT Comments    Patient  reports decreased discomfort. Assisted to BSc, Much difficulty with the left Cam boot. Can benmefit from a right shoe, friend in room aware to try to get her shoe. Continue PT.   Recommendations for follow up therapy are one component of a multi-disciplinary discharge planning process, led by the attending physician.  Recommendations may be updated based on patient status, additional functional criteria and insurance authorization.  Follow Up Recommendations  Skilled nursing-short term rehab (<3 hours/day) Can patient physically be transported by private vehicle: No   Assistance Recommended at Discharge Frequent or constant Supervision/Assistance  Patient can return home with the following A lot of help with walking and/or transfers;A lot of help with bathing/dressing/bathroom;Assist for transportation   Equipment Recommendations  Rolling walker (2 wheels)    Recommendations for Other Services       Precautions / Restrictions Precautions Precautions: Fall Precaution Comments: L wrist fx, left elbow swelling/tenderson from fall Restrictions Weight Bearing Restrictions: Yes LUE Weight Bearing: Non weight bearing LLE Weight Bearing: Weight bearing as tolerated Other Position/Activity Restrictions: with CAM boot     Mobility  Bed Mobility   Bed Mobility: Supine to Sit     Supine to sit: Min assist, HOB elevated     General bed mobility comments: extra time,    Transfers Overall transfer level:  Needs assistance Equipment used: 2 person hand held assist Transfers: Sit to/from Stand, Bed to chair/wheelchair/BSC Sit to Stand: Mod assist, +2 physical assistance, +2 safety/equipment Stand pivot transfers: Mod assist, +2 safety/equipment         General transfer comment: assisted to Stand with [place] vs use of paltform RW, cues to reach for right BSC armrest  then pivot, much difficulty with L Cam boot and no shoe on the right.    Ambulation/Gait                   Stairs             Wheelchair Mobility    Modified Rankin (Stroke Patients Only)       Balance Overall balance assessment: Needs assistance, History of Falls Sitting-balance support: No upper extremity supported, Feet supported Sitting balance-Leahy Scale: Fair     Standing balance support: During functional activity, Reliant on assistive device for balance, Bilateral upper extremity supported Standing balance-Leahy Scale: Poor                              Cognition Arousal/Alertness: Awake/alert Behavior During Therapy: WFL for tasks assessed/performed Overall Cognitive Status: Within Functional Limits for tasks assessed                                          Exercises      General Comments        Pertinent Vitals/Pain Pain Assessment Pain Assessment: Faces Faces Pain Scale: Hurts little more Pain Location: L wrist, left ankle Pain Descriptors /  Indicators: Discomfort Pain Intervention(s): Monitored during session    Home Living                          Prior Function            PT Goals (current goals can now be found in the care plan section) Progress towards PT goals: Progressing toward goals    Frequency    Min 2X/week      PT Plan Current plan remains appropriate    Co-evaluation              AM-PAC PT "6 Clicks" Mobility   Outcome Measure  Help needed turning from your back to your side while in a flat bed  without using bedrails?: A Lot Help needed moving from lying on your back to sitting on the side of a flat bed without using bedrails?: A Lot Help needed moving to and from a bed to a chair (including a wheelchair)?: A Lot Help needed standing up from a chair using your arms (e.g., wheelchair or bedside chair)?: A Lot Help needed to walk in hospital room?: Total Help needed climbing 3-5 steps with a railing? : Total 6 Click Score: 10    End of Session   Activity Tolerance: Patient limited by fatigue Patient left: in chair;with call bell/phone within reach;with nursing/sitter in room;with family/visitor present (BSC)   PT Visit Diagnosis: Unsteadiness on feet (R26.81);Difficulty in walking, not elsewhere classified (R26.2);Pain Pain - Right/Left: Left Pain - part of body: Ankle and joints of foot     Time: 9379-0240 PT Time Calculation (min) (ACUTE ONLY): 11 min  Charges:  $Therapeutic Activity: 8-22 mins                     Tresa Endo PT Acute Rehabilitation Services Office [phone] Weekend [phone]    Claretha Cooper 07/25/2022, 1:51 PM

## 2022-07-25 NOTE — TOC Initial Note (Signed)
Transition of Care [facility]) - Initial/Assessment Note    Patient Details  Name: Alison Bridges MRN: [id number] Date of Birth: [birth year]  Transition of Care [facility]) CM/SW Contact:    Angelita Ingles, RN Phone [phone]  07/25/2022, 3:17 PM  Clinical Narrative:                 [facility] consulted for SNF placement. CM at bedside introduced self and role. Patient states that she is aware that she will need short term rehab. Patient is from [place] independent living where she functioned independently  prior to hospitalization. Patient states that she does have a PCP and follows up on a regular basis. Patient states that she has access to meds and they are affordable. Patient states that that she also has transportation to appointments. Patient provided medicare .gov listing for SNF. Patient states that her top choices would be [place], [place] or [employer]. PASRR # [id number] A. FL2 completed. Patient info has been sent out via hub for bed offers. TOC will follow for placement.  Planned Disposition: Skilled Nursing Facility Barriers to Discharge: Continued Medical Work up, SNF Pending bed offer   Patient Goals and CMS Choice Patient states their goals for this hospitalization and ongoing recovery are:: Wants to get better to return to independent living at [facility].gov Compare Post Acute Care list provided to:: Patient Choice offered to / list presented to : Patient [place] ownership interest in [facility].provided to::  (n/a)    Expected Discharge Plan and Services Planned Disposition: [place] In-house Referral: NA Discharge Planning Services: CM Consult Post Acute Care Choice: [place] Living arrangements for the past 2 months: [place] ([place])                 DME Arranged: N/A DME Agency: NA       HH Arranged: NA          Prior Living Arrangements/Services Living arrangements  for the past 2 months: [place] ([place]) Lives with:: Self Patient language and need for interpreter reviewed:: Yes Do you feel safe going back to the place where you live?: Yes      Need for Family Participation in Patient Care: Yes (Comment) Care giver support system in place?: Yes (comment) Current home services:  (n/a) Criminal Activity/Legal Involvement Pertinent to Current Situation/Hospitalization: No - Comment as needed  Activities of Daily Living Home Assistive Devices/Equipment: Dentures (specify type), Shower chair without back, Grab bars in shower, Grab bars around toilet ADL Screening (condition at time of admission) Patient's cognitive ability adequate to safely complete daily activities?: Yes Is the patient deaf or have difficulty hearing?: Yes Does the patient have difficulty seeing, even when wearing glasses/contacts?: No Does the patient have difficulty concentrating, remembering, or making decisions?: Yes Patient able to express need for assistance with ADLs?: Yes Does the patient have difficulty dressing or bathing?: Yes Independently performs ADLs?: No Communication: Independent Dressing (OT): Needs assistance Is this a change from baseline?: Pre-admission baseline Grooming: Needs assistance Is this a change from baseline?: Pre-admission baseline Feeding: Independent Bathing: Needs assistance Is this a change from baseline?: Pre-admission baseline Toileting: Needs assistance Is this a change from baseline?: Pre-admission baseline In/Out Bed: Needs assistance Is this a change from baseline?: Pre-admission baseline Walks in Home: Needs assistance Is this a change from baseline?: Pre-admission baseline Does the patient have difficulty walking or climbing stairs?: Yes Weakness of Legs: Left Weakness of Arms/Hands: Left  Permission Sought/Granted Permission sought to share information with : Family Supports Permission granted to share  information with : Yes, Verbal Permission Granted  Share Information with NAME: [place] son     Permission granted to share info w Relationship: son  Permission granted to share info w Contact Information: [phone]  Emotional Assessment Appearance:: Appears younger than stated age Attitude/Demeanor/Rapport: Gracious Affect (typically observed): Accepting, Pleasant Orientation: : Oriented to Self, Oriented to Place, Oriented to  Time, Oriented to Situation Alcohol / Substance Use: Not Applicable Psych Involvement: No (comment)  Admission diagnosis:  General weakness [R53.1] AKI (acute kidney injury) ([place]) [N17.9] Symptomatic anemia [D64.9] Patient Active Problem List   Diagnosis Date Noted   Malnutrition of moderate degree 07/24/2022   Symptomatic anemia 07/21/2022   Diverticulitis 01/13/2022   Stage 3b chronic kidney disease (CKD) ([place]) 01/13/2022   Sepsis ([place]) 03/23/2020   AKI (acute kidney injury) ([place]) 03/22/2020   Acute urinary retention 03/22/2020   Acute lower UTI 03/22/2020   MDS (myelodysplastic syndrome) ([place]) 03/13/2020   Macrocytic anemia 03/13/2020   Cellulitis of right ankle 12/08/2019   Pancytopenia ([place]) 12/08/2019   Palpitations 11/25/2017   PCP:  Burnard Bunting, MD Pharmacy:   [facility] [id number] - [place], [place] - [address] [place] [address] Phone: [phone] Fax: [phone]     Social Determinants of Health (SDOH) Social History: [place]: No Food Insecurity (07/22/2022)  Housing: Low Risk  (07/22/2022)  Transportation Needs: No Transportation Needs (07/22/2022)  Utilities: Not At Risk (07/22/2022)  Depression (PHQ2-9): Low Risk  (02/29/2020)  Tobacco Use: Low Risk  (07/21/2022)   SDOH Interventions: Housing Interventions: Intervention Not Indicated   Readmission Risk Interventions    07/25/2022    3:02 PM  Readmission Risk Prevention Plan  Transportation  Screening Complete  PCP or Specialist Appt within 5-7 Days Complete  Home Care Screening Complete  Medication Review (RN CM) Referral to Pharmacy

## 2022-07-25 NOTE — [place] FL2 (Signed)
[place] MEDICAID FL2 LEVEL OF CARE FORM     IDENTIFICATION  Patient Name: Alison Bridges Birthdate: August 05, 1929 Sex: female Admission Date (Current Location): 07/21/2022  [facility] and [place] Number:  Herbalist and Address:  [facility],  [place], [place]      Provider Number: [id number]  Attending Physician Name and Address:  Damita Lack, MD  Relative Name and Phone Number:  Everline Mahaffy (son) [phone]    Current Level of Care: SNF Recommended Level of Care: [place] Prior Approval Number:    Date Approved/Denied:   PASRR Number: [id number]  Discharge Plan: SNF    Current Diagnoses: Patient Active Problem List   Diagnosis Date Noted   Malnutrition of moderate degree 07/24/2022   Symptomatic anemia 07/21/2022   Diverticulitis 01/13/2022   Stage 3b chronic kidney disease (CKD) ([place]) 01/13/2022   Sepsis ([place]) 03/23/2020   AKI (acute kidney injury) ([place]) 03/22/2020   Acute urinary retention 03/22/2020   Acute lower UTI 03/22/2020   MDS (myelodysplastic syndrome) ([place]) 03/13/2020   Macrocytic anemia 03/13/2020   Cellulitis of right ankle 12/08/2019   Pancytopenia ([place]) 12/08/2019   Palpitations 11/25/2017    Orientation RESPIRATION BLADDER Height & Weight     Self, Time, Situation, Place  Normal Continent Weight: 56.2 kg Height:  '5\' 8"'$  (172.7 cm)  BEHAVIORAL SYMPTOMS/MOOD NEUROLOGICAL BOWEL NUTRITION STATUS     (n/a) Continent Diet (Regular)  AMBULATORY STATUS COMMUNICATION OF NEEDS Skin   Limited Assist Verbally  (laceration to left arm)                       Personal Care Assistance Level of Assistance  Bathing, Feeding, Dressing Bathing Assistance: Limited assistance Feeding assistance: Limited assistance Dressing Assistance: Limited assistance     Functional Limitations Info  Sight, Hearing, Speech Sight Info: Adequate Hearing Info: Adequate Speech Info: Adequate    SPECIAL  CARE FACTORS FREQUENCY  PT (By licensed PT), OT (By licensed OT)     PT Frequency: 5x/wk OT Frequency: 5X/wk            Contractures Contractures Info: Not present    Additional Factors Info  Code Status, Allergies, Psychotropic, Insulin Sliding Scale, Isolation Precautions, Suctioning Needs Code Status Info: DNR Allergies Info: No Known Allergies Psychotropic Info: n/a see discharge summary Insulin Sliding Scale Info: n/a see discharge summary Isolation Precautions Info: n/a Suctioning Needs: n/a   Current Medications (07/25/2022):  This is the current hospital active medication list Current Facility-Administered Medications  Medication Dose Route Frequency Provider Last Rate Last Admin   0.9 %  sodium chloride infusion (Manually program via Guardrails IV Fluids)   Intravenous Once Charlesetta Shanks, MD   Held at 07/21/22 1219   acetaminophen (TYLENOL) tablet 650 mg  650 mg Oral Q6H PRN Amin, Ankit Chirag, MD   650 mg at 07/25/22 1311   azelastine (ASTELIN) 0.1 % nasal spray 1 spray  1 spray Each Nare Daily PRN [name], MD       B-complex with vitamin C tablet 1 tablet  1 tablet Oral Daily [name], MD   1 tablet at 07/25/22 0924   cephALEXin (KEFLEX) capsule 500 mg  500 mg Oral Q12H [name], MD   500 mg at 07/25/22 6606   feeding supplement (ENSURE ENLIVE / ENSURE PLUS) liquid 237 mL  237 mL Oral BID BM Amin, Ankit Chirag, MD   237 mL at  07/25/22 0925   fluticasone furoate-vilanterol (BREO ELLIPTA) 100-25 MCG/ACT 1 puff  1 puff Inhalation Daily [name], MD   1 puff at 07/25/22 0609   And   umeclidinium bromide (INCRUSE ELLIPTA) 62.5 MCG/ACT 1 puff  1 puff Inhalation Daily [name], MD   1 puff at 07/25/22 0608   guaiFENesin (MUCINEX) 12 hr tablet 600 mg  600 mg Oral BID [name], MD   600 mg at 07/25/22 0925   guaiFENesin (ROBITUSSIN) 100 MG/5ML liquid 5 mL  5 mL Oral Q4H PRN Amin, Ankit Chirag, MD       HYDROmorphone (DILAUDID)  injection 0.5 mg  0.5 mg Intravenous Q2H PRN [name], MD   0.5 mg at 07/24/22 2112   ipratropium-albuterol (DUONEB) 0.5-2.5 (3) MG/3ML nebulizer solution 3 mL  3 mL Nebulization Q4H PRN Amin, [name], MD       levothyroxine (SYNTHROID) tablet 125 mcg  125 mcg Oral Q0600 [name], MD   125 mcg at 07/25/22 0506   methocarbamol (ROBAXIN) 500 mg in dextrose 5 % 50 mL IVPB  500 mg Intravenous Q8H PRN [name], MD       metoprolol tartrate (LOPRESSOR) injection 5 mg  5 mg Intravenous Q4H PRN Amin, Ankit Chirag, MD       ondansetron (ZOFRAN) injection 4 mg  4 mg Intravenous Q6H PRN Amin, Ankit Chirag, MD       ondansetron (ZOFRAN) tablet 4 mg  4 mg Oral Q6H PRN [name], MD       oxyCODONE (Oxy IR/ROXICODONE) immediate release tablet 5 mg  5 mg Oral Q4H PRN [name], MD   5 mg at 07/25/22 0930   pantoprazole (PROTONIX) EC tablet 40 mg  40 mg Oral Daily PRN [name], MD       senna-docusate (Senokot-S) tablet 1 tablet  1 tablet Oral QHS PRN [name], MD       sertraline (ZOLOFT) tablet 100 mg  100 mg Oral Daily [name], MD   100 mg at 07/25/22 4650   traZODone (DESYREL) tablet 50 mg  50 mg Oral QHS PRN Damita Lack, MD         Discharge Medications: Please see discharge summary for a list of discharge medications.  Relevant Imaging Results:  Relevant Lab Results:   Additional Information SS# [id number]  Angelita Ingles, RN

## 2022-07-25 NOTE — Progress Notes (Signed)
PROGRESS NOTE    Alison Bridges  YNW:[id number] DOB: 1929-07-18 DOA: 07/21/2022 PCP: Burnard Bunting, MD   Brief Narrative:  88 y.o. female with PMH significant of MDS with pancytopenia, GERD, hypothyroidism, depression, CKD 3B presented to hospital with complaints of a fall.  Found to have AKI on CKD, acute blood loss anemia requiring transfusion, left wrist laceration/fracture, distal fibular fracture, left gluteal hematoma.  Patient was started on IV fluids, transfusion.  Case was discussed with orthopedic who recommended conservative management and outpatient follow-up.  Patient was seen by physical therapy who recommended SNF.   Assessment & Plan:  Principal Problem:   Symptomatic anemia Active Problems:   Malnutrition of moderate degree   Acute blood loss anemia Thrombocytopenia Left gluteal hematoma Suspect traumatic in nature secondary to fall.  Also has left gluteal hematoma.  Patient is not on any long-term anticoagulation.  Admission hemoglobin 5.3, requiring PRBC transfusion.  Initially improved to 8.5 now drifted down again.  Hemoglobin is able around 8.0  2 units PRBC transfused 12/17 1 unit PRBC transfused 12/19   Acute kidney injury on CKD 3B. Hyperkalemia. Admission creatinine 2.4, after IV fluids, creatinine remained stable at 1.3.   Left wrist fracture with a laceration. Distal fibular fracture on the left side -Currently 16 sutures are in place.  Case was discussed by EDP with [facility] Greta Do[facility] who recommended splint placement of the left wrist and outpatient follow-up.  Also for the left distal fibular fracture, weightbearing as tolerated.     Myelodysplastic syndrome. Pancytopenia. Follows up with Dr. Irene Limbo.    Goals of care conversation. Patient prefers to be DNR/DNI understand the meaning of it. Will monitor.  PT/OT-SNF   DVT prophylaxis: SCDs Start: 07/21/22 1655 Place TED hose Start: 07/21/22 1655  Code Status: DNR Family  Communication: Family at bedside  Status is: Inpatient SNF placement   Subjective: No complaints feeling okay Sitting up in the recliner  Examination:  Constitutional: Not in acute distress Respiratory: Clear to auscultation bilaterally Cardiovascular: Normal sinus rhythm, no rubs Abdomen: Nontender nondistended good bowel sounds Musculoskeletal: No edema noted Skin: No rashes seen Neurologic: CN 2-12 grossly intact.  And nonfocal Psychiatric: Normal judgment and insight. Alert and oriented x 3. Normal mood. Left shoulder in sling Left ankle in boot  Objective: Vitals:   07/24/22 0818 07/24/22 1305 07/24/22 1944 07/25/22 0510  BP:  (!) 129/52 (!) 155/56 (!) 139/59  Pulse:  84 95 80  Resp:  '18 16 18  '$ Temp:  97.9 F (36.6 C) 99 F (37.2 C) 98 F (36.7 C)  TempSrc:  Oral Oral Oral  SpO2: 94% 95% 96% 97%  Weight:      Height:        Intake/Output Summary (Last 24 hours) at 07/25/2022 0840 Last data filed at 07/24/2022 1740 Gross per 24 hour  Intake --  Output 450 ml  Net -450 ml   Filed Weights   07/21/22 0913  Weight: 56.2 kg     Data Reviewed:   CBC: Recent Labs  Lab 07/20/22 1701 07/21/22 0930 07/21/22 1810 07/22/22 0324 07/23/22 0755 07/24/22 0653 07/25/22 0527  WBC 1.9* 5.8 4.8 5.3 2.8* 1.8* 1.7*  NEUTROABS 0.6* 2.1 1.8  --   --   --   --   HGB 7.3* 6.4* 5.3* 8.5* 6.9* 8.1* 8.0*  HCT 24.0* 20.8* 17.1* 26.0* 21.4* 25.5* 25.0*  MCV 106.2* 107.8* 108.2* 97.7 99.5 98.5 99.2  PLT 74* 74* 67* 62* 58* 46* 47*  Basic Metabolic Panel: Recent Labs  Lab 07/21/22 0958 07/21/22 1810 07/22/22 0324 07/23/22 0755 07/24/22 0653 07/25/22 0527  NA  --  130* 131* 133* 130* 132*  K  --  5.6* 5.2* 4.0 3.5 3.5  CL  --  102 103 108 106 105  CO2  --  18* 21* 19* 19* 21*  GLUCOSE  --  127* 133* 106* 116* 110*  BUN  --  48* 44* 34* 26* 25*  CREATININE  --  2.44* 2.06* 1.59* 1.36* 1.33*  CALCIUM  --  8.5* 8.3* 7.4* 7.5* 7.7*  MG 2.1  --   --  1.9 1.7 1.7    GFR: Estimated Creatinine Clearance: 24.4 mL/min (A) (by C-G formula based on SCr of 1.33 mg/dL (H)). Liver Function Tests: Recent Labs  Lab 07/21/22 0930 07/22/22 0324  AST 37 25  ALT 19 14  ALKPHOS 64 60  BILITOT 0.5 0.8  PROT 6.8 6.4*  ALBUMIN 3.6 3.3*   No results for input(s): "LIPASE", "AMYLASE" in the last 168 hours. No results for input(s): "AMMONIA" in the last 168 hours. Coagulation Profile: No results for input(s): "INR", "PROTIME" in the last 168 hours. Cardiac Enzymes: No results for input(s): "CKTOTAL", "CKMB", "CKMBINDEX", "TROPONINI" in the last 168 hours. BNP (last 3 results) No results for input(s): "PROBNP" in the last 8760 hours. HbA1C: No results for input(s): "HGBA1C" in the last 72 hours. CBG: No results for input(s): "GLUCAP" in the last 168 hours. Lipid Profile: No results for input(s): "CHOL", "HDL", "LDLCALC", "TRIG", "CHOLHDL", "LDLDIRECT" in the last 72 hours. Thyroid Function Tests: No results for input(s): "TSH", "T4TOTAL", "FREET4", "T3FREE", "THYROIDAB" in the last 72 hours. Anemia Panel: No results for input(s): "VITAMINB12", "FOLATE", "FERRITIN", "TIBC", "IRON", "RETICCTPCT" in the last 72 hours. Sepsis Labs: No results for input(s): "PROCALCITON", "LATICACIDVEN" in the last 168 hours.  Recent Results (from the past 240 hour(s))  Resp panel by RT-PCR (RSV, Flu A&B, Covid) Anterior Nasal Swab     Status: None   Collection Time: 07/21/22  9:30 AM   Specimen: Anterior Nasal Swab  Result Value Ref Range Status   SARS Coronavirus 2 by RT PCR NEGATIVE NEGATIVE Final    Comment: (NOTE) SARS-CoV-2 target nucleic acids are NOT DETECTED.  The SARS-CoV-2 RNA is generally detectable in upper respiratory specimens during the acute phase of infection. The lowest concentration of SARS-CoV-2 viral copies this assay can detect is 138 copies/mL. A negative result does not preclude SARS-Cov-2 infection and should not be used as the sole basis for  treatment or other patient management decisions. A negative result may occur with  improper specimen collection/handling, submission of specimen other than nasopharyngeal swab, presence of viral mutation(s) within the areas targeted by this assay, and inadequate number of viral copies(<138 copies/mL). A negative result must be combined with clinical observations, patient history, and epidemiological information. The expected result is Negative.  Fact Sheet for Patients:  EntrepreneurPulse.com.au  Fact Sheet for Healthcare Providers:  IncredibleEmployment.be  This test is no t yet approved or cleared by the [place] FDA and  has been authorized for detection and/or diagnosis of SARS-CoV-2 by FDA under an Emergency Use Authorization (EUA). This EUA will remain  in effect (meaning this test can be used) for the duration of the COVID-19 declaration under Section 564(b)(1) of the Act, 21 U.S.C.section 360bbb-3(b)(1), unless the authorization is terminated  or revoked sooner.       Influenza A by PCR NEGATIVE NEGATIVE Final   Influenza B  by PCR NEGATIVE NEGATIVE Final    Comment: (NOTE) The Xpert Xpress SARS-CoV-2/FLU/RSV plus assay is intended as an aid in the diagnosis of influenza from Nasopharyngeal swab specimens and should not be used as a sole basis for treatment. Nasal washings and aspirates are unacceptable for Xpert Xpress SARS-CoV-2/FLU/RSV testing.  Fact Sheet for Patients: EntrepreneurPulse.com.au  Fact Sheet for Healthcare Providers: IncredibleEmployment.be  This test is not yet approved or cleared by the [place] FDA and has been authorized for detection and/or diagnosis of SARS-CoV-2 by FDA under an Emergency Use Authorization (EUA). This EUA will remain in effect (meaning this test can be used) for the duration of the COVID-19 declaration under Section 564(b)(1) of the Act, 21  U.S.C. section 360bbb-3(b)(1), unless the authorization is terminated or revoked.     Resp Syncytial Virus by PCR NEGATIVE NEGATIVE Final    Comment: (NOTE) Fact Sheet for Patients: EntrepreneurPulse.com.au  Fact Sheet for Healthcare Providers: IncredibleEmployment.be  This test is not yet approved or cleared by the [place] FDA and has been authorized for detection and/or diagnosis of SARS-CoV-2 by FDA under an Emergency Use Authorization (EUA). This EUA will remain in effect (meaning this test can be used) for the duration of the COVID-19 declaration under Section 564(b)(1) of the Act, 21 U.S.C. section 360bbb-3(b)(1), unless the authorization is terminated or revoked.  Performed at [facility], [place] [address], [place], [place] [address]          Radiology Studies: No results found.      Scheduled Meds:  sodium chloride   Intravenous Once   B-complex with vitamin C  1 tablet Oral Daily   cephALEXin  500 mg Oral Q12H   feeding supplement  237 mL Oral BID BM   fluticasone furoate-vilanterol  1 puff Inhalation Daily   And   umeclidinium bromide  1 puff Inhalation Daily   guaiFENesin  600 mg Oral BID   levothyroxine  125 mcg Oral Q0600   sertraline  100 mg Oral Daily   Continuous Infusions:  methocarbamol (ROBAXIN) IV       LOS: 4 days   Time spent= 35 mins    Gaines Cartmell Arsenio Loader, MD [facility]  If 7PM-7AM, please contact night-coverage  07/25/2022, 8:40 AM

## 2022-07-26 DIAGNOSIS — D649 Anemia, unspecified: Secondary | ICD-10-CM | POA: Diagnosis not present

## 2022-07-26 LAB — BASIC METABOLIC PANEL
Anion gap: 7 (ref 5–15)
BUN: 23 mg/dL (ref 8–23)
CO2: 20 mmol/L — ABNORMAL LOW (ref 22–32)
Calcium: 7.8 mg/dL — ABNORMAL LOW (ref 8.9–10.3)
Chloride: 106 mmol/L (ref 98–111)
Creatinine, Ser: 1.21 mg/dL — ABNORMAL HIGH (ref 0.44–1.00)
GFR, Estimated: 42 mL/min — ABNORMAL LOW (ref >60)
Glucose, Bld: 105 mg/dL — ABNORMAL HIGH (ref 70–99)
Potassium: 4 mmol/L (ref 3.5–5.1)
Sodium: 133 mmol/L — ABNORMAL LOW (ref 135–145)

## 2022-07-26 LAB — CBC
HCT: 25.3 % — ABNORMAL LOW (ref 36.0–46.0)
Hemoglobin: 8.1 g/dL — ABNORMAL LOW (ref 12.0–15.0)
MCH: 32 pg (ref 26.0–34.0)
MCHC: 32 g/dL (ref 30.0–36.0)
MCV: 100 fL (ref 80.0–100.0)
Platelets: 45 10*3/uL — ABNORMAL LOW (ref 150–400)
RBC: 2.53 MIL/uL — ABNORMAL LOW (ref 3.87–5.11)
RDW: 21 % — ABNORMAL HIGH (ref 11.5–15.5)
WBC: 1.7 10*3/uL — ABNORMAL LOW (ref 4.0–10.5)
nRBC: 5.8 % — ABNORMAL HIGH (ref 0.0–0.2)

## 2022-07-26 LAB — MAGNESIUM: Magnesium: 1.8 mg/dL (ref 1.7–2.4)

## 2022-07-26 MED ORDER — SENNOSIDES-DOCUSATE SODIUM 8.6-50 MG PO TABS: 1.0000 | ORAL_TABLET | Freq: Two times a day (BID) | ORAL | Status: DC | PRN

## 2022-07-26 MED ORDER — OXYCODONE HCL 5 MG PO TABS: 5.0000 mg | ORAL_TABLET | Freq: Four times a day (QID) | ORAL | 0 refills | Status: DC | PRN

## 2022-07-26 MED ORDER — CEPHALEXIN 500 MG PO CAPS: 500.0000 mg | ORAL_CAPSULE | Freq: Two times a day (BID) | ORAL | 0 refills | Status: AC

## 2022-07-26 NOTE — TOC Progression Note (Addendum)
Transition of Care [facility]) - Progression Note    Patient Details  Name: Alison Bridges MRN: [id number] Date of Birth: November 13, 1928  [facility] CM/SW Tyler Run, RN Phone Number:[phone]  07/26/2022, 1:27 PM  Clinical Narrative:    Patient has bed offers. CM at bedside to make patient aware of bed offers. Patient is agreeable to [facility]. CM spoke with Campbell Lerner to make him aware of bed offer. [facility] is agreeable with [facility]. CM has called [facility] admissions message left for [facility] @ [phone]. CM awaiting return call to determine if facility can accept patient today.   1539 CM spoke with Brita Romp at [facility] to verify if patient is able to come to facility today. Per [facility] patient can admit to facility tomorrow Saturday 12/23. [place] unsure if [facility] will transport. CM called [facility] and spoke with dispatcher who assured CM that [facility] will be up and running as usual. Son Rush Landmark has been updated. MD and nurse have also been updated.     Barriers to Discharge: Continued Medical Work up, SNF Pending bed offer  Expected Discharge Plan and Services In-house Referral: NA Discharge Planning Services: CM Consult Post Acute Care Choice: [facility] arrangements for the past 2 months: [place] ([place])                 DME Arranged: N/A DME Agency: NA       HH Arranged: NA           Social Determinants of Health (SDOH) Interventions SDOH Screenings   Food Insecurity: No Food Insecurity (07/22/2022)  Housing: Low Risk  (07/22/2022)  Transportation Needs: No Transportation Needs (07/22/2022)  Utilities: Not At Risk (07/22/2022)  Depression (PHQ2-9): Low Risk  (02/29/2020)  Tobacco Use: Low Risk  (07/21/2022)    Readmission Risk Interventions    07/25/2022    3:02 PM  Readmission Risk Prevention Plan  Transportation Screening Complete  PCP or Specialist Appt within 5-7 Days Complete  Home Care  Screening Complete  Medication Review (RN CM) Referral to Pharmacy

## 2022-07-26 NOTE — Discharge Summary (Signed)
Physician Discharge Summary  Alison Bridges VZC:[id number] DOB: 08/29/1928 DOA: 07/21/2022  PCP: Burnard Bunting, MD  Admit date: 07/21/2022 Discharge date: 07/27/2022  Admitted From: Home  Disposition:  SNF  Recommendations for Outpatient Follow-up:  Follow up with PCP in 1-2 weeks Please obtain BMP/CBC in one week your next doctors visit.  Keflex PO until 12/27 Outpatient follow up with [facility], Dr Greta Doom In next 7 days   Discharge Condition: Stable CODE STATUS: DNR Diet recommendation: Regular  Brief/Interim Summary: 88 y.o. female with PMH significant of MDS with pancytopenia, GERD, hypothyroidism, depression, CKD 3B presented to hospital with complaints of a fall.  Found to have AKI on CKD, acute blood loss anemia requiring transfusion, left wrist laceration/fracture, distal fibular fracture, left gluteal hematoma.  Patient was started on IV fluids, transfusion.  Case was discussed with orthopedic who recommended conservative management and outpatient follow-up.  Patient was seen by physical therapy who recommended SNF.    Acute blood loss anemia Thrombocytopenia Left gluteal hematoma Suspect traumatic in nature secondary to fall.  Also has left gluteal hematoma.  Patient is not on any long-term anticoagulation.  Admission hemoglobin 5.3, requiring PRBC transfusion.  Initially improved to 8.5 now drifted down again.  Hemoglobin stable 8.1   2 units PRBC transfused 12/17 1 unit PRBC transfused 12/19   Acute kidney injury on CKD 3B. Resolved Hyperkalemia. Admission creatinine 2.4, after IV fluids, creatinine remained stable at 1.21   Left wrist fracture with a laceration. Distal fibular fracture on the left side -Currently 16 sutures are in place.  Case was discussed by EDP with Dr. Greta Doom from [facility] who recommended splint placement of the left wrist and outpatient follow-up.  Also for the left distal fibular fracture, weightbearing as tolerated. On Keflex, will  complete 10 day course. Last day 12/27      Myelodysplastic syndrome. Pancytopenia. Follows up with Dr. Irene Limbo.    Goals of care conversation. DNR/DNI   PT/OT-SNF        Discharge Diagnoses:  Principal Problem:   Symptomatic anemia Active Problems:   Malnutrition of moderate degree      Consultations: [facility]  Subjective: Doing ok no complaints.   Discharge Exam: Vitals:   07/26/22 2028 07/27/22 0554  BP: 132/64 (!) 136/57  Pulse: 91 77  Resp: 18 16  Temp: 98.7 F (37.1 C) 97.9 F (36.6 C)  SpO2: 95% 97%   Vitals:   07/26/22 0539 07/26/22 1340 07/26/22 2028 07/27/22 0554  BP: (!) 145/70 (!) 142/68 132/64 (!) 136/57  Pulse: 83 86 91 77  Resp: '16 20 18 16  '$ Temp: 97.7 F (36.5 C) 98.1 F (36.7 C) 98.7 F (37.1 C) 97.9 F (36.6 C)  TempSrc: Oral Oral Oral Oral  SpO2: 96% 97% 95% 97%  Weight:      Height:        General: Pt is alert, awake, not in acute distress Cardiovascular: RRR, S1/S2 +, no rubs, no gallops Respiratory: CTA bilaterally, no wheezing, no rhonchi Abdominal: Soft, NT, ND, bowel sounds + Extremities: no edema, no cyanosis Left wrist dressing in place, Left foot in boot.   Discharge Instructions   Allergies as of 07/27/2022   No Known Allergies      Medication List     STOP taking these medications    oxybutynin 5 MG tablet Commonly known as: DITROPAN   phenazopyridine 200 MG tablet Commonly known as: Pyridium   traMADol 50 MG tablet Commonly known as: Veatrice Bourbon  TAKE these medications    Aranesp (Albumin Free) 150 MCG/0.3ML Sosy injection Generic drug: Darbepoetin Alfa Inject 150 mcg into the skin every 30 (thirty) days. 12-19-21 last dose given   Azelastine HCl 137 MCG/SPRAY Soln Place 1 spray into both nostrils daily as needed (allergies).   b complex vitamins capsule Take 1 capsule by mouth daily.   cephALEXin 500 MG capsule Commonly known as: KEFLEX Take 1 capsule (500 mg total) by mouth every  12 (twelve) hours for 5 days.   cholecalciferol 1000 units tablet Commonly known as: VITAMIN D Take 1,000 Units by mouth daily.   DELSYM PO Take 10 mLs by mouth daily.   guaiFENesin 600 MG 12 hr tablet Commonly known as: MUCINEX Take 600 mg by mouth 2 (two) times daily.   levothyroxine 125 MCG tablet Commonly known as: SYNTHROID Take 125 mcg by mouth daily before breakfast.   nystatin 100000 UNIT/ML suspension Commonly known as: MYCOSTATIN Take 5 mLs (500,000 Units total) by mouth 4 (four) times daily. Swish in the mouth for 1-2 mins then swallow   OVER THE COUNTER MEDICATION Take 1 capsule by mouth daily. Blood Builder   oxyCODONE 5 MG immediate release tablet Commonly known as: Oxy IR/ROXICODONE Take 1 tablet (5 mg total) by mouth every 6 (six) hours as needed for moderate pain or severe pain.   pantoprazole 40 MG tablet Commonly known as: PROTONIX Take 40 mg by mouth daily as needed (acid reflux).   senna-docusate 8.6-50 MG tablet Commonly known as: Senokot-S Take 1 tablet by mouth 2 (two) times daily as needed for mild constipation or moderate constipation.   sertraline 100 MG tablet Commonly known as: ZOLOFT Take 100 mg by mouth daily.   Trelegy Ellipta 100-62.5-25 MCG/ACT Aepb Generic drug: Fluticasone-Umeclidin-Vilant Take 1 puff by mouth daily.   VITAMIN C PO Take 1 tablet by mouth daily.        No Known Allergies  You were cared for by a hospitalist during your hospital stay. If you have any questions about your discharge medications or the care you received while you were in the hospital after you are discharged, you can call the unit and asked to speak with the hospitalist on call if the hospitalist that took care of you is not available. Once you are discharged, your primary care physician will handle any further medical issues. Please note that no refills for any discharge medications will be authorized once you are discharged, as it is imperative  that you return to your primary care physician (or establish a relationship with a primary care physician if you do not have one) for your aftercare needs so that they can reassess your need for medications and monitor your lab values.   Procedures/Studies: CT CHEST ABDOMEN PELVIS WO CONTRAST  Result Date: 07/21/2022 CLINICAL DATA:  Trauma EXAM: CT CHEST, ABDOMEN AND PELVIS WITHOUT CONTRAST TECHNIQUE: Multidetector CT imaging of the chest, abdomen and pelvis was performed following the standard protocol without IV contrast. RADIATION DOSE REDUCTION: This exam was performed according to the departmental dose-optimization program which includes automated exposure control, adjustment of the mA and/or kV according to patient size and/or use of iterative reconstruction technique. COMPARISON:  CT abdomen and pelvis done on 01/28/2022 FINDINGS: CT CHEST FINDINGS Cardiovascular: There are scattered coronary artery calcifications. Calcifications are seen in thoracic aorta and its major branches. Mediastinum/Nodes: No significant lymphadenopathy seen. There is no mediastinal hematoma. Thyroid appears smaller than usual in size with few coarse calcifications. Lungs/Pleura: There is no  focal pulmonary consolidation. There is no pleural effusion or pneumothorax. Increased markings with tree-in-bud appearance is seen in posterior segment of right upper lobe. Passive congestive changes are noted in the posterior lower lung fields on both sides. In image 31 of series 6, there are 2 small nodular densities larger 1 measuring 6 mm. There is small area of increased interstitial markings in the anteromedial left upper lobe in image 68. There is a linear infiltrate in anteromedial left upper lobe in image 79. In image 100, there is a 3 mm nodule in left lower lobe. There is no pleural effusion or pneumothorax. Musculoskeletal: No acute findings are seen in bony structures. There is old healed fracture in the anterior right ninth  rib. CT ABDOMEN PELVIS FINDINGS Hepatobiliary: Liver is enlarged measuring 23.5 cm in length. There is no dilation of bile ducts. Surgical clips are seen in gallbladder fossa. Pancreas: No focal abnormalities are seen. Spleen: Spleen is enlarged measuring 15.6 cm in length. Adrenals/Urinary Tract: Adrenals are unremarkable. There is no hydronephrosis. There are a few left renal calculi he is measuring less than 5 mm. There are few smooth marginated fluid density lesions in both kidneys largest measuring 7.3 cm in size in the anterior upper pole of left kidney. In image 87 of series 2, there is 11 mm smooth marginated exophytic lesion with density measurements higher than usual for simple cyst in the lower pole of left kidney. In image 72, there is 8 mm exophytic lesion with density measurements higher than usual in the midportion of right kidney. These may suggest hemorrhagic or proteinaceous cysts. No significant interval changes are noted. There are no demonstrable ureteral stones. Urinary bladder is unremarkable. Stomach/Bowel: Small hiatal hernia is seen. There is fluid in the lumen of thoracic esophagus suggesting possible gastroesophageal reflux. High density is seen in the lumen of the thoracic esophagus, stomach and duodenum, possibly oral contrast or oral medication. Small bowel loops are unremarkable. Appendix is not seen. There is no pericecal inflammation. There is no significant wall thickening in colon. Scattered diverticula are seen in colon without signs of focal acute diverticulitis. Vascular/Lymphatic: There are scattered calcifications in abdominal aorta and its major branches. Reproductive: Uterus is not seen. Other: There is no ascites or pneumoperitoneum. There is subcutaneous contusion and hematoma in left gluteal region. There is a hematoma measuring 9.2 x 5.3 x 2.2 cm in subcutaneous plane in the lateral left gluteal region. Musculoskeletal: No recent displaced fractures are seen. There is  first-degree anterolisthesis at the L5-S1 level. Degenerative changes are noted in lumbar spine with encroachment of neural foramina at multiple levels. Degenerative changes are noted in both hips. IMPRESSION: There is subcutaneous contusion and hematoma in lateral left gluteal region. Hematoma in this region measures 9.2 x 5.3 x 2.2 cm. No other acute findings are seen in noncontrast CT chest, abdomen and pelvis. There is no mediastinal or retroperitoneal hematoma. There is no laceration in solid organs. There is no ascites or pneumoperitoneum. Increased interstitial markings with tree-in-bud appearance in posterior segment of right upper lobe suggesting possible chronic inflammation or scarring. There is small patchy interstitial infiltrate in anteromedial aspect of left upper lobe suggesting scarring or small focus of pneumonia. There are few nodular densities in left lung measuring up to 6 mm in size. Please take into consideration patient's age in regard to the following recommendation. Non-contrast chest CT at 3-6 months is recommended. If the nodules are stable at time of repeat CT, then future CT at 18-24  months (from today's scan) is considered optional for low-risk patients, but is recommended for high-risk patients. This recommendation follows the consensus statement: Guidelines for Management of Incidental Pulmonary Nodules Detected on CT Images: From the Fleischner Society 2017; Radiology 2017; 284:228-243. Enlarged liver and spleen. Diverticulosis of colon without signs of focal diverticulitis. Gastroesophageal reflux. Left renal stones. Renal cysts. There are few small exophytic lesions in the kidneys with density measurements higher than usual for simple cyst, possibly hemorrhagic or proteinaceous cysts. This finding has not changed significantly. Aortic arteriosclerosis. Coronary artery calcifications are seen. Other findings as described in the body of the report. Electronically Signed   By: Elmer Picker M.D.   On: 07/21/2022 12:50   DG Hand Complete Left  Result Date: 07/20/2022 CLINICAL DATA:  Fall on outstretched hand. EXAM: LEFT HAND - COMPLETE 3+ VIEW COMPARISON:  None Available. FINDINGS: A comminuted angulated displaced fracture of the distal radius extending into the radiocarpal joint is again identified. And ulnar styloid fractures again identified. Carpal bones are intact. Degenerative changes are identified between the base of the first metacarpal and trapezium. Degenerative changes are identified throughout the interphalangeal joints most prominent in the thumb. No fractures are identified in the hand. IMPRESSION: 1. Comminuted angulated displaced fracture of the distal radius extending into the radiocarpal joint. Ulnar styloid fracture. 2. Degenerative changes as above. 3. No fractures in the hand. Electronically Signed   By: Dorise Bullion III M.D.   On: 07/20/2022 16:50   DG Wrist Complete Left  Result Date: 07/20/2022 CLINICAL DATA:  Pain after fall EXAM: LEFT WRIST - COMPLETE 3+ VIEW COMPARISON:  None Available. FINDINGS: There is a displaced and angulated comminuted fracture through the distal radius. There appears to be extension into the radiocarpal joint. There is an ulnar styloid fracture. No wrist dislocation. No other abnormalities. IMPRESSION: 1. Displaced and angulated comminuted fracture through the distal radius. There appears to be extension into the radiocarpal joint. 2. Ulnar styloid fracture. Electronically Signed   By: Dorise Bullion III M.D.   On: 07/20/2022 16:48   DG Foot Complete Left  Result Date: 07/20/2022 CLINICAL DATA:  Fall.  Swelling. EXAM: LEFT FOOT - COMPLETE 3+ VIEW COMPARISON:  None Available. FINDINGS: There is a hallux valgus deformity. No fractures identified in the left foot. IMPRESSION: No fractures identified in the left foot. No acute abnormality in the left foot. Lateral malleolar soft tissue swelling with a distal fibular tip  fracture better appreciated on the ankle x-rays. Electronically Signed   By: Dorise Bullion III M.D.   On: 07/20/2022 16:47   DG Ankle Complete Left  Result Date: 07/20/2022 CLINICAL DATA:  Pain after twisting ankle EXAM: LEFT ANKLE COMPLETE - 3+ VIEW COMPARISON:  None Available. FINDINGS: There is a fracture through the distal tip of the fibula with overlying soft tissue swelling. The ankle mortise is intact. No other fractures are identified. No other acute abnormalities. IMPRESSION: Fracture through the distal tip of the fibula with overlying soft tissue swelling. Electronically Signed   By: Dorise Bullion III M.D.   On: 07/20/2022 16:45     The results of significant diagnostics from this hospitalization (including imaging, microbiology, ancillary and laboratory) are listed below for reference.     Microbiology: Recent Results (from the past 240 hour(s))  Resp panel by RT-PCR (RSV, Flu A&B, Covid) Anterior Nasal Swab     Status: None   Collection Time: 07/21/22  9:30 AM   Specimen: Anterior Nasal Swab  Result  Value Ref Range Status   SARS Coronavirus 2 by RT PCR NEGATIVE NEGATIVE Final    Comment: (NOTE) SARS-CoV-2 target nucleic acids are NOT DETECTED.  The SARS-CoV-2 RNA is generally detectable in upper respiratory specimens during the acute phase of infection. The lowest concentration of SARS-CoV-2 viral copies this assay can detect is 138 copies/mL. A negative result does not preclude SARS-Cov-2 infection and should not be used as the sole basis for treatment or other patient management decisions. A negative result may occur with  improper specimen collection/handling, submission of specimen other than nasopharyngeal swab, presence of viral mutation(s) within the areas targeted by this assay, and inadequate number of viral copies(<138 copies/mL). A negative result must be combined with clinical observations, patient history, and epidemiological information. The expected  result is Negative.  Fact Sheet for Patients:  EntrepreneurPulse.com.au  Fact Sheet for Healthcare Providers:  IncredibleEmployment.be  This test is no t yet approved or cleared by the [place] FDA and  has been authorized for detection and/or diagnosis of SARS-CoV-2 by FDA under an Emergency Use Authorization (EUA). This EUA will remain  in effect (meaning this test can be used) for the duration of the COVID-19 declaration under Section 564(b)(1) of the Act, 21 U.S.C.section 360bbb-3(b)(1), unless the authorization is terminated  or revoked sooner.       Influenza A by PCR NEGATIVE NEGATIVE Final   Influenza B by PCR NEGATIVE NEGATIVE Final    Comment: (NOTE) The Xpert Xpress SARS-CoV-2/FLU/RSV plus assay is intended as an aid in the diagnosis of influenza from Nasopharyngeal swab specimens and should not be used as a sole basis for treatment. Nasal washings and aspirates are unacceptable for Xpert Xpress SARS-CoV-2/FLU/RSV testing.  Fact Sheet for Patients: EntrepreneurPulse.com.au  Fact Sheet for Healthcare Providers: IncredibleEmployment.be  This test is not yet approved or cleared by the [place] FDA and has been authorized for detection and/or diagnosis of SARS-CoV-2 by FDA under an Emergency Use Authorization (EUA). This EUA will remain in effect (meaning this test can be used) for the duration of the COVID-19 declaration under Section 564(b)(1) of the Act, 21 U.S.C. section 360bbb-3(b)(1), unless the authorization is terminated or revoked.     Resp Syncytial Virus by PCR NEGATIVE NEGATIVE Final    Comment: (NOTE) Fact Sheet for Patients: EntrepreneurPulse.com.au  Fact Sheet for Healthcare Providers: IncredibleEmployment.be  This test is not yet approved or cleared by the [place] FDA and has been authorized for detection and/or diagnosis of  SARS-CoV-2 by FDA under an Emergency Use Authorization (EUA). This EUA will remain in effect (meaning this test can be used) for the duration of the COVID-19 declaration under Section 564(b)(1) of the Act, 21 U.S.C. section 360bbb-3(b)(1), unless the authorization is terminated or revoked.  Performed at [facility], [place] [address] [place], [place] [address]      Labs: BNP (last 3 results) Recent Labs    01/13/22 2211  BNP 78.4   Basic Metabolic Panel: Recent Labs  Lab 07/21/22 0958 07/21/22 1810 07/22/22 0324 07/23/22 0755 07/24/22 0653 07/25/22 0527 07/26/22 0537  NA  --    < > 131* 133* 130* 132* 133*  K  --    < > 5.2* 4.0 3.5 3.5 4.0  CL  --    < > 103 108 106 105 106  CO2  --    < > 21* 19* 19* 21* 20*  GLUCOSE  --    < > 133* 106* 116* 110* 105*  BUN  --    < > 44* 34* 26* 25* 23  CREATININE  --    < > 2.06* 1.59* 1.36* 1.33* 1.21*  CALCIUM  --    < > 8.3* 7.4* 7.5* 7.7* 7.8*  MG 2.1  --   --  1.9 1.7 1.7 1.8   < > = values in this interval not displayed.   Liver Function Tests: Recent Labs  Lab 07/21/22 0930 07/22/22 0324  AST 37 25  ALT 19 14  ALKPHOS 64 60  BILITOT 0.5 0.8  PROT 6.8 6.4*  ALBUMIN 3.6 3.3*   No results for input(s): "LIPASE", "AMYLASE" in the last 168 hours. No results for input(s): "AMMONIA" in the last 168 hours. CBC: Recent Labs  Lab 07/20/22 1701 07/21/22 0930 07/21/22 1810 07/22/22 0324 07/23/22 0755 07/24/22 0653 07/25/22 0527 07/26/22 0537  WBC 1.9* 5.8 4.8 5.3 2.8* 1.8* 1.7* 1.7*  NEUTROABS 0.6* 2.1 1.8  --   --   --   --   --   HGB 7.3* 6.4* 5.3* 8.5* 6.9* 8.1* 8.0* 8.1*  HCT 24.0* 20.8* 17.1* 26.0* 21.4* 25.5* 25.0* 25.3*  MCV 106.2* 107.8* 108.2* 97.7 99.5 98.5 99.2 100.0  PLT 74* 74* 67* 62* 58* 46* 47* 45*   Cardiac Enzymes: No results for input(s): "CKTOTAL", "CKMB", "CKMBINDEX", "TROPONINI" in the last 168 hours. BNP: Invalid input(s): "POCBNP" CBG: No results for input(s):  "GLUCAP" in the last 168 hours. D-Dimer No results for input(s): "DDIMER" in the last 72 hours. Hgb A1c No results for input(s): "HGBA1C" in the last 72 hours. Lipid Profile No results for input(s): "CHOL", "HDL", "LDLCALC", "TRIG", "CHOLHDL", "LDLDIRECT" in the last 72 hours. Thyroid function studies No results for input(s): "TSH", "T4TOTAL", "T3FREE", "THYROIDAB" in the last 72 hours.  Invalid input(s): "FREET3" Anemia work up No results for input(s): "VITAMINB12", "FOLATE", "FERRITIN", "TIBC", "IRON", "RETICCTPCT" in the last 72 hours. Urinalysis    Component Value Date/Time   COLORURINE AMBER (A) 07/21/2022 0913   APPEARANCEUR HAZY (A) 07/21/2022 0913   LABSPEC 1.016 07/21/2022 0913   PHURINE 5.0 07/21/2022 0913   GLUCOSEU NEGATIVE 07/21/2022 0913   HGBUR NEGATIVE 07/21/2022 0913   BILIRUBINUR NEGATIVE 07/21/2022 0913   KETONESUR NEGATIVE 07/21/2022 0913   PROTEINUR 100 (A) 07/21/2022 0913   NITRITE NEGATIVE 07/21/2022 0913   LEUKOCYTESUR NEGATIVE 07/21/2022 0913   Sepsis Labs Recent Labs  Lab 07/23/22 0755 07/24/22 0653 07/25/22 0527 07/26/22 0537  WBC 2.8* 1.8* 1.7* 1.7*   Microbiology Recent Results (from the past 240 hour(s))  Resp panel by RT-PCR (RSV, Flu A&B, Covid) Anterior Nasal Swab     Status: None   Collection Time: 07/21/22  9:30 AM   Specimen: Anterior Nasal Swab  Result Value Ref Range Status   SARS Coronavirus 2 by RT PCR NEGATIVE NEGATIVE Final    Comment: (NOTE) SARS-CoV-2 target nucleic acids are NOT DETECTED.  The SARS-CoV-2 RNA is generally detectable in upper respiratory specimens during the acute phase of infection. The lowest concentration of SARS-CoV-2 viral copies this assay can detect is 138 copies/mL. A negative result does not preclude SARS-Cov-2 infection and should not be used as the sole basis for treatment or other patient management decisions. A negative result may occur with  improper specimen collection/handling, submission  of specimen other than nasopharyngeal swab, presence of viral mutation(s) within the areas targeted by this assay, and inadequate number of viral copies(<138 copies/mL). A negative result must be combined with clinical observations, patient history, and epidemiological  information. The expected result is Negative.  Fact Sheet for Patients:  EntrepreneurPulse.com.au  Fact Sheet for Healthcare Providers:  IncredibleEmployment.be  This test is no t yet approved or cleared by the [place] FDA and  has been authorized for detection and/or diagnosis of SARS-CoV-2 by FDA under an Emergency Use Authorization (EUA). This EUA will remain  in effect (meaning this test can be used) for the duration of the COVID-19 declaration under Section 564(b)(1) of the Act, 21 U.S.C.section 360bbb-3(b)(1), unless the authorization is terminated  or revoked sooner.       Influenza A by PCR NEGATIVE NEGATIVE Final   Influenza B by PCR NEGATIVE NEGATIVE Final    Comment: (NOTE) The Xpert Xpress SARS-CoV-2/FLU/RSV plus assay is intended as an aid in the diagnosis of influenza from Nasopharyngeal swab specimens and should not be used as a sole basis for treatment. Nasal washings and aspirates are unacceptable for Xpert Xpress SARS-CoV-2/FLU/RSV testing.  Fact Sheet for Patients: EntrepreneurPulse.com.au  Fact Sheet for Healthcare Providers: IncredibleEmployment.be  This test is not yet approved or cleared by the [place] FDA and has been authorized for detection and/or diagnosis of SARS-CoV-2 by FDA under an Emergency Use Authorization (EUA). This EUA will remain in effect (meaning this test can be used) for the duration of the COVID-19 declaration under Section 564(b)(1) of the Act, 21 U.S.C. section 360bbb-3(b)(1), unless the authorization is terminated or revoked.     Resp Syncytial Virus by PCR NEGATIVE NEGATIVE  Final    Comment: (NOTE) Fact Sheet for Patients: EntrepreneurPulse.com.au  Fact Sheet for Healthcare Providers: IncredibleEmployment.be  This test is not yet approved or cleared by the [place] FDA and has been authorized for detection and/or diagnosis of SARS-CoV-2 by FDA under an Emergency Use Authorization (EUA). This EUA will remain in effect (meaning this test can be used) for the duration of the COVID-19 declaration under Section 564(b)(1) of the Act, 21 U.S.C. section 360bbb-3(b)(1), unless the authorization is terminated or revoked.  Performed at [facility], [place] [address], [place], [place] [address]      Time coordinating discharge:  I have spent 35 minutes face to face with the patient and on the ward discussing the patients care, assessment, plan and disposition with other care givers. >50% of the time was devoted counseling the patient about the risks and benefits of treatment/Discharge disposition and coordinating care.   SIGNED:   Damita Lack, MD  [facility] 07/27/2022, 11:36 AM   If 7PM-7AM, please contact night-coverage

## 2022-07-26 NOTE — Progress Notes (Signed)
PROGRESS NOTE    Alison Bridges  CZY:[id number] DOB: 03/11/1929 DOA: 07/21/2022 PCP: Burnard Bunting, MD   Brief Narrative:  88 y.o. female with PMH significant of MDS with pancytopenia, GERD, hypothyroidism, depression, CKD 3B presented to hospital with complaints of a fall.  Found to have AKI on CKD, acute blood loss anemia requiring transfusion, left wrist laceration/fracture, distal fibular fracture, left gluteal hematoma.  Patient was started on IV fluids, transfusion.  Case was discussed with orthopedic who recommended conservative management and outpatient follow-up.  Patient was seen by physical therapy who recommended SNF.   Assessment & Plan:  Principal Problem:   Symptomatic anemia Active Problems:   Malnutrition of moderate degree   Acute blood loss anemia Thrombocytopenia Left gluteal hematoma Suspect traumatic in nature secondary to fall.  Also has left gluteal hematoma.  Patient is not on any long-term anticoagulation.  Admission hemoglobin 5.3, requiring PRBC transfusion.  Initially improved to 8.5 now drifted down again.  Hemoglobin stable 8.1  2 units PRBC transfused 12/17 1 unit PRBC transfused 12/19   Acute kidney injury on CKD 3B. Hyperkalemia. Admission creatinine 2.4, after IV fluids, creatinine remained stable at 1.21   Left wrist fracture with a laceration. Distal fibular fracture on the left side -Currently 16 sutures are in place.  Case was discussed by EDP with Dr. Greta Doom from [facility] who recommended splint placement of the left wrist and outpatient follow-up.  Also for the left distal fibular fracture, weightbearing as tolerated. On Keflex, will complete 10 day course. Last day 12/27     Myelodysplastic syndrome. Pancytopenia. Follows up with Dr. Irene Limbo.    Goals of care conversation. DNR/DNI  PT/OT-SNF   DVT prophylaxis: SCDs Start: 07/21/22 1655 Place TED hose Start: 07/21/22 1655  Code Status: DNR Family Communication: Family at  bedside  Status is: Inpatient SNF placement   Subjective: No complaints.   Examination: Constitutional: Not in acute distress Respiratory: Clear to auscultation bilaterally Cardiovascular: Normal sinus rhythm, no rubs Abdomen: Nontender nondistended good bowel sounds Musculoskeletal: No edema noted Skin: No rashes seen Neurologic: CN 2-12 grossly intact.  And nonfocal Psychiatric: Normal judgment and insight. Alert and oriented x 3. Normal mood.    Left shoulder in sling Left ankle in boot  Objective: Vitals:   07/25/22 0510 07/25/22 1307 07/25/22 2036 07/26/22 0539  BP: (!) 139/59 135/65 (!) 146/58 (!) 145/70  Pulse: 80 84 96 83  Resp: '18 20 18 16  '$ Temp: 98 F (36.7 C) 98.4 F (36.9 C) 98.2 F (36.8 C) 97.7 F (36.5 C)  TempSrc: Oral Oral Oral Oral  SpO2: 97% 96% 97% 96%  Weight:      Height:        Intake/Output Summary (Last 24 hours) at 07/26/2022 0831 Last data filed at 07/26/2022 0500 Gross per 24 hour  Intake 360 ml  Output 1500 ml  Net -1140 ml   Filed Weights   07/21/22 0913  Weight: 56.2 kg     Data Reviewed:   CBC: Recent Labs  Lab 07/20/22 1701 07/21/22 0930 07/21/22 1810 07/22/22 0324 07/23/22 0755 07/24/22 0653 07/25/22 0527 07/26/22 0537  WBC 1.9* 5.8 4.8 5.3 2.8* 1.8* 1.7* 1.7*  NEUTROABS 0.6* 2.1 1.8  --   --   --   --   --   HGB 7.3* 6.4* 5.3* 8.5* 6.9* 8.1* 8.0* 8.1*  HCT 24.0* 20.8* 17.1* 26.0* 21.4* 25.5* 25.0* 25.3*  MCV 106.2* 107.8* 108.2* 97.7 99.5 98.5 99.2 100.0  PLT 74* 74*  67* 62* 58* 46* 47* 45*   Basic Metabolic Panel: Recent Labs  Lab 07/21/22 0958 07/21/22 1810 07/22/22 0324 07/23/22 0755 07/24/22 0653 07/25/22 0527 07/26/22 0537  NA  --    < > 131* 133* 130* 132* 133*  K  --    < > 5.2* 4.0 3.5 3.5 4.0  CL  --    < > 103 108 106 105 106  CO2  --    < > 21* 19* 19* 21* 20*  GLUCOSE  --    < > 133* 106* 116* 110* 105*  BUN  --    < > 44* 34* 26* 25* 23  CREATININE  --    < > 2.06* 1.59* 1.36*  1.33* 1.21*  CALCIUM  --    < > 8.3* 7.4* 7.5* 7.7* 7.8*  MG 2.1  --   --  1.9 1.7 1.7 1.8   < > = values in this interval not displayed.   GFR: Estimated Creatinine Clearance: 26.9 mL/min (A) (by C-G formula based on SCr of 1.21 mg/dL (H)). Liver Function Tests: Recent Labs  Lab 07/21/22 0930 07/22/22 0324  AST 37 25  ALT 19 14  ALKPHOS 64 60  BILITOT 0.5 0.8  PROT 6.8 6.4*  ALBUMIN 3.6 3.3*   No results for input(s): "LIPASE", "AMYLASE" in the last 168 hours. No results for input(s): "AMMONIA" in the last 168 hours. Coagulation Profile: No results for input(s): "INR", "PROTIME" in the last 168 hours. Cardiac Enzymes: No results for input(s): "CKTOTAL", "CKMB", "CKMBINDEX", "TROPONINI" in the last 168 hours. BNP (last 3 results) No results for input(s): "PROBNP" in the last 8760 hours. HbA1C: No results for input(s): "HGBA1C" in the last 72 hours. CBG: No results for input(s): "GLUCAP" in the last 168 hours. Lipid Profile: No results for input(s): "CHOL", "HDL", "LDLCALC", "TRIG", "CHOLHDL", "LDLDIRECT" in the last 72 hours. Thyroid Function Tests: No results for input(s): "TSH", "T4TOTAL", "FREET4", "T3FREE", "THYROIDAB" in the last 72 hours. Anemia Panel: No results for input(s): "VITAMINB12", "FOLATE", "FERRITIN", "TIBC", "IRON", "RETICCTPCT" in the last 72 hours. Sepsis Labs: No results for input(s): "PROCALCITON", "LATICACIDVEN" in the last 168 hours.  Recent Results (from the past 240 hour(s))  Resp panel by RT-PCR (RSV, Flu A&B, Covid) Anterior Nasal Swab     Status: None   Collection Time: 07/21/22  9:30 AM   Specimen: Anterior Nasal Swab  Result Value Ref Range Status   SARS Coronavirus 2 by RT PCR NEGATIVE NEGATIVE Final    Comment: (NOTE) SARS-CoV-2 target nucleic acids are NOT DETECTED.  The SARS-CoV-2 RNA is generally detectable in upper respiratory specimens during the acute phase of infection. The lowest concentration of SARS-CoV-2 viral copies this  assay can detect is 138 copies/mL. A negative result does not preclude SARS-Cov-2 infection and should not be used as the sole basis for treatment or other patient management decisions. A negative result may occur with  improper specimen collection/handling, submission of specimen other than nasopharyngeal swab, presence of viral mutation(s) within the areas targeted by this assay, and inadequate number of viral copies(<138 copies/mL). A negative result must be combined with clinical observations, patient history, and epidemiological information. The expected result is Negative.  Fact Sheet for Patients:  EntrepreneurPulse.com.au  Fact Sheet for Healthcare Providers:  IncredibleEmployment.be  This test is no t yet approved or cleared by the [place] FDA and  has been authorized for detection and/or diagnosis of SARS-CoV-2 by FDA under an Emergency Use Authorization (EUA). This  EUA will remain  in effect (meaning this test can be used) for the duration of the COVID-19 declaration under Section 564(b)(1) of the Act, 21 U.S.C.section 360bbb-3(b)(1), unless the authorization is terminated  or revoked sooner.       Influenza A by PCR NEGATIVE NEGATIVE Final   Influenza B by PCR NEGATIVE NEGATIVE Final    Comment: (NOTE) The Xpert Xpress SARS-CoV-2/FLU/RSV plus assay is intended as an aid in the diagnosis of influenza from Nasopharyngeal swab specimens and should not be used as a sole basis for treatment. Nasal washings and aspirates are unacceptable for Xpert Xpress SARS-CoV-2/FLU/RSV testing.  Fact Sheet for Patients: EntrepreneurPulse.com.au  Fact Sheet for Healthcare Providers: IncredibleEmployment.be  This test is not yet approved or cleared by the [place] FDA and has been authorized for detection and/or diagnosis of SARS-CoV-2 by FDA under an Emergency Use Authorization (EUA). This EUA will  remain in effect (meaning this test can be used) for the duration of the COVID-19 declaration under Section 564(b)(1) of the Act, 21 U.S.C. section 360bbb-3(b)(1), unless the authorization is terminated or revoked.     Resp Syncytial Virus by PCR NEGATIVE NEGATIVE Final    Comment: (NOTE) Fact Sheet for Patients: EntrepreneurPulse.com.au  Fact Sheet for Healthcare Providers: IncredibleEmployment.be  This test is not yet approved or cleared by the [place] FDA and has been authorized for detection and/or diagnosis of SARS-CoV-2 by FDA under an Emergency Use Authorization (EUA). This EUA will remain in effect (meaning this test can be used) for the duration of the COVID-19 declaration under Section 564(b)(1) of the Act, 21 U.S.C. section 360bbb-3(b)(1), unless the authorization is terminated or revoked.  Performed at [facility], [place] [address], [place], [place] [address]          Radiology Studies: No results found.      Scheduled Meds:  sodium chloride   Intravenous Once   B-complex with vitamin C  1 tablet Oral Daily   cephALEXin  500 mg Oral Q12H   feeding supplement  237 mL Oral BID BM   fluticasone furoate-vilanterol  1 puff Inhalation Daily   And   umeclidinium bromide  1 puff Inhalation Daily   guaiFENesin  600 mg Oral BID   levothyroxine  125 mcg Oral Q0600   sertraline  100 mg Oral Daily   Continuous Infusions:  methocarbamol (ROBAXIN) IV       LOS: 5 days   Time spent= 35 mins    Rayanna Matusik Arsenio Loader, MD [facility]  If 7PM-7AM, please contact night-coverage  07/26/2022, 8:31 AM

## 2022-07-27 DIAGNOSIS — M25532 Pain in left wrist: Secondary | ICD-10-CM | POA: Diagnosis not present

## 2022-07-27 DIAGNOSIS — M79662 Pain in left lower leg: Secondary | ICD-10-CM | POA: Diagnosis not present

## 2022-07-27 DIAGNOSIS — S82202D Unspecified fracture of shaft of left tibia, subsequent encounter for closed fracture with routine healing: Secondary | ICD-10-CM | POA: Diagnosis not present

## 2022-07-27 DIAGNOSIS — M79602 Pain in left arm: Secondary | ICD-10-CM | POA: Diagnosis not present

## 2022-07-27 DIAGNOSIS — S62102A Fracture of unspecified carpal bone, left wrist, initial encounter for closed fracture: Secondary | ICD-10-CM | POA: Diagnosis not present

## 2022-07-27 DIAGNOSIS — Z4789 Encounter for other orthopedic aftercare: Secondary | ICD-10-CM | POA: Diagnosis not present

## 2022-07-27 DIAGNOSIS — T7840XD Allergy, unspecified, subsequent encounter: Secondary | ICD-10-CM | POA: Diagnosis not present

## 2022-07-27 DIAGNOSIS — S61512A Laceration without foreign body of left wrist, initial encounter: Secondary | ICD-10-CM | POA: Diagnosis not present

## 2022-07-27 DIAGNOSIS — S8265XA Nondisplaced fracture of lateral malleolus of left fibula, initial encounter for closed fracture: Secondary | ICD-10-CM | POA: Diagnosis not present

## 2022-07-27 DIAGNOSIS — K219 Gastro-esophageal reflux disease without esophagitis: Secondary | ICD-10-CM | POA: Diagnosis not present

## 2022-07-27 DIAGNOSIS — S52502A Unspecified fracture of the lower end of left radius, initial encounter for closed fracture: Secondary | ICD-10-CM | POA: Diagnosis not present

## 2022-07-27 DIAGNOSIS — K5900 Constipation, unspecified: Secondary | ICD-10-CM | POA: Diagnosis not present

## 2022-07-27 DIAGNOSIS — G8911 Acute pain due to trauma: Secondary | ICD-10-CM | POA: Diagnosis not present

## 2022-07-27 DIAGNOSIS — N1832 Chronic kidney disease, stage 3b: Secondary | ICD-10-CM | POA: Diagnosis not present

## 2022-07-27 DIAGNOSIS — Z7401 Bed confinement status: Secondary | ICD-10-CM | POA: Diagnosis not present

## 2022-07-27 DIAGNOSIS — S52592A Other fractures of lower end of left radius, initial encounter for closed fracture: Secondary | ICD-10-CM | POA: Diagnosis not present

## 2022-07-27 DIAGNOSIS — D469 Myelodysplastic syndrome, unspecified: Secondary | ICD-10-CM | POA: Diagnosis not present

## 2022-07-27 DIAGNOSIS — E039 Hypothyroidism, unspecified: Secondary | ICD-10-CM | POA: Diagnosis not present

## 2022-07-27 DIAGNOSIS — M25572 Pain in left ankle and joints of left foot: Secondary | ICD-10-CM | POA: Diagnosis not present

## 2022-07-27 DIAGNOSIS — D649 Anemia, unspecified: Secondary | ICD-10-CM | POA: Diagnosis not present

## 2022-07-27 DIAGNOSIS — D61818 Other pancytopenia: Secondary | ICD-10-CM | POA: Diagnosis not present

## 2022-07-27 DIAGNOSIS — F419 Anxiety disorder, unspecified: Secondary | ICD-10-CM | POA: Diagnosis not present

## 2022-07-27 DIAGNOSIS — S51812A Laceration without foreign body of left forearm, initial encounter: Secondary | ICD-10-CM | POA: Diagnosis not present

## 2022-07-27 DIAGNOSIS — F32A Depression, unspecified: Secondary | ICD-10-CM | POA: Diagnosis not present

## 2022-07-27 DIAGNOSIS — S82402D Unspecified fracture of shaft of left fibula, subsequent encounter for closed fracture with routine healing: Secondary | ICD-10-CM | POA: Diagnosis not present

## 2022-07-27 DIAGNOSIS — S6990XA Unspecified injury of unspecified wrist, hand and finger(s), initial encounter: Secondary | ICD-10-CM | POA: Diagnosis not present

## 2022-07-27 DIAGNOSIS — J45909 Unspecified asthma, uncomplicated: Secondary | ICD-10-CM | POA: Diagnosis not present

## 2022-07-27 DIAGNOSIS — R059 Cough, unspecified: Secondary | ICD-10-CM | POA: Diagnosis not present

## 2022-07-27 DIAGNOSIS — K5792 Diverticulitis of intestine, part unspecified, without perforation or abscess without bleeding: Secondary | ICD-10-CM | POA: Diagnosis not present

## 2022-07-27 DIAGNOSIS — Z23 Encounter for immunization: Secondary | ICD-10-CM | POA: Diagnosis not present

## 2022-07-27 DIAGNOSIS — L89896 Pressure-induced deep tissue damage of other site: Secondary | ICD-10-CM | POA: Diagnosis not present

## 2022-07-27 DIAGNOSIS — A0472 Enterocolitis due to Clostridium difficile, not specified as recurrent: Secondary | ICD-10-CM | POA: Diagnosis not present

## 2022-07-27 DIAGNOSIS — W19XXXD Unspecified fall, subsequent encounter: Secondary | ICD-10-CM | POA: Diagnosis not present

## 2022-07-27 DIAGNOSIS — S42302D Unspecified fracture of shaft of humerus, left arm, subsequent encounter for fracture with routine healing: Secondary | ICD-10-CM | POA: Diagnosis not present

## 2022-07-27 DIAGNOSIS — M545 Low back pain, unspecified: Secondary | ICD-10-CM | POA: Diagnosis not present

## 2022-07-27 DIAGNOSIS — Y92099 Unspecified place in other non-institutional residence as the place of occurrence of the external cause: Secondary | ICD-10-CM | POA: Diagnosis not present

## 2022-07-27 DIAGNOSIS — S82832A Other fracture of upper and lower end of left fibula, initial encounter for closed fracture: Secondary | ICD-10-CM | POA: Diagnosis not present

## 2022-07-27 DIAGNOSIS — S82402A Unspecified fracture of shaft of left fibula, initial encounter for closed fracture: Secondary | ICD-10-CM | POA: Diagnosis not present

## 2022-07-27 DIAGNOSIS — N179 Acute kidney failure, unspecified: Secondary | ICD-10-CM | POA: Diagnosis not present

## 2022-07-27 DIAGNOSIS — W010XXA Fall on same level from slipping, tripping and stumbling without subsequent striking against object, initial encounter: Secondary | ICD-10-CM | POA: Diagnosis not present

## 2022-07-27 MED ORDER — MELATONIN 5 MG PO TABS
5.0000 mg | ORAL_TABLET | Freq: Every evening | ORAL | Status: DC | PRN
  Administered 2022-07-27: 5 mg via ORAL
  Filled 2022-07-27: qty 1

## 2022-07-27 NOTE — TOC Transition Note (Signed)
Transition of Care [facility]) - CM/SW Discharge Note   Patient Details  Name: Alison Bridges MRN: [id number] Date of Birth: 27-Jun-1929  [facility] CM/SW Contact:  Illene Regulus, LCSW Phone Number: 07/27/2022, 9:54 AM   Clinical Narrative:    CSW spoke with facility to confirm acceptance. PTAR called.      Barriers to Discharge: Continued Medical Work up, SNF Pending bed offer   Patient Goals and CMS Choice CMS Medicare.gov Compare Post Acute Care list provided to:: Patient Choice offered to / list presented to : Patient  Discharge Placement                         Discharge Plan and Services Additional resources added to the After Visit Summary for   In-house Referral: NA Discharge Planning Services: CM Consult Post Acute Care Choice: [place]          DME Arranged: N/A DME Agency: NA       HH Arranged: NA          Social Determinants of Health (SDOH) Interventions SDOH Screenings   Food Insecurity: No Food Insecurity (07/22/2022)  Housing: Low Risk  (07/22/2022)  Transportation Needs: No Transportation Needs (07/22/2022)  Utilities: Not At Risk (07/22/2022)  Depression (PHQ2-9): Low Risk  (02/29/2020)  Tobacco Use: Low Risk  (07/21/2022)     Readmission Risk Interventions    07/25/2022    3:02 PM  Readmission Risk Prevention Plan  Transportation Screening Complete  PCP or Specialist Appt within 5-7 Days Complete  Home Care Screening Complete  Medication Review (RN CM) Referral to Pharmacy

## 2022-07-27 NOTE — Progress Notes (Signed)
Patient seen and examined just prior to her discharge.  Son and daughter-in-law are at bedside.  Patient does have any complaints, very pleasant.  Vital signs remained stable.  She remains stable for discharge.  Discharge summary from yesterday has been updated. Call with any further questions as needed Discussed with patient's RN  Gerlean Ren MD [facility]

## 2022-08-04 DIAGNOSIS — D469 Myelodysplastic syndrome, unspecified: Secondary | ICD-10-CM | POA: Diagnosis not present

## 2022-08-04 DIAGNOSIS — S62102A Fracture of unspecified carpal bone, left wrist, initial encounter for closed fracture: Secondary | ICD-10-CM | POA: Diagnosis not present

## 2022-08-04 DIAGNOSIS — A0472 Enterocolitis due to Clostridium difficile, not specified as recurrent: Secondary | ICD-10-CM | POA: Diagnosis not present

## 2022-08-04 DIAGNOSIS — S51812A Laceration without foreign body of left forearm, initial encounter: Secondary | ICD-10-CM | POA: Diagnosis not present

## 2022-08-04 DIAGNOSIS — S82402A Unspecified fracture of shaft of left fibula, initial encounter for closed fracture: Secondary | ICD-10-CM | POA: Diagnosis not present

## 2022-08-07 DIAGNOSIS — S52502A Unspecified fracture of the lower end of left radius, initial encounter for closed fracture: Secondary | ICD-10-CM | POA: Diagnosis not present

## 2022-08-07 DIAGNOSIS — M25532 Pain in left wrist: Secondary | ICD-10-CM | POA: Diagnosis not present

## 2022-08-13 ENCOUNTER — Encounter: Payer: Self-pay | Admitting: Hematology

## 2022-08-13 ENCOUNTER — Other Ambulatory Visit: Payer: Self-pay | Admitting: *Deleted

## 2022-08-13 NOTE — Patient Outreach (Signed)
Mrs. Tillison currently resides in [facility] skilled [facility] facility. Screening for potential care coordination services as benefit of insurance plan and Primary Care Provider.   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 Transition plan is to return to [facility] with family support and possible extra assistance.   No identifiable [facility] care coordination needs identified at this time.   Marthenia Rolling, MSN, RN,BSN [place] Acute Care Coordinator [phone] (Direct dial)

## 2022-08-14 DIAGNOSIS — S52502A Unspecified fracture of the lower end of left radius, initial encounter for closed fracture: Secondary | ICD-10-CM | POA: Diagnosis not present

## 2022-08-14 DIAGNOSIS — M25532 Pain in left wrist: Secondary | ICD-10-CM | POA: Diagnosis not present

## 2022-08-17 DIAGNOSIS — M545 Low back pain, unspecified: Secondary | ICD-10-CM | POA: Diagnosis not present

## 2022-08-17 DIAGNOSIS — K5900 Constipation, unspecified: Secondary | ICD-10-CM | POA: Diagnosis not present

## 2022-08-17 DIAGNOSIS — A0472 Enterocolitis due to Clostridium difficile, not specified as recurrent: Secondary | ICD-10-CM | POA: Diagnosis not present

## 2022-08-19 DIAGNOSIS — S8265XA Nondisplaced fracture of lateral malleolus of left fibula, initial encounter for closed fracture: Secondary | ICD-10-CM | POA: Diagnosis not present

## 2022-08-19 DIAGNOSIS — M25572 Pain in left ankle and joints of left foot: Secondary | ICD-10-CM | POA: Diagnosis not present

## 2022-08-20 ENCOUNTER — Ambulatory Visit: Payer: Medicare Other

## 2022-08-20 ENCOUNTER — Ambulatory Visit: Payer: Medicare Other | Admitting: Hematology

## 2022-08-20 ENCOUNTER — Other Ambulatory Visit: Payer: Medicare Other

## 2022-08-21 DIAGNOSIS — L89896 Pressure-induced deep tissue damage of other site: Secondary | ICD-10-CM | POA: Diagnosis not present

## 2022-08-23 ENCOUNTER — Telehealth: Payer: Self-pay | Admitting: Hematology

## 2022-08-23 NOTE — Telephone Encounter (Signed)
Called 2x per 1/18 IB, was put on hold both times with no answer after waiting a while

## 2022-08-26 DIAGNOSIS — Z9181 History of falling: Secondary | ICD-10-CM | POA: Diagnosis not present

## 2022-08-26 DIAGNOSIS — S82402D Unspecified fracture of shaft of left fibula, subsequent encounter for closed fracture with routine healing: Secondary | ICD-10-CM | POA: Diagnosis not present

## 2022-08-26 DIAGNOSIS — Z4789 Encounter for other orthopedic aftercare: Secondary | ICD-10-CM | POA: Diagnosis not present

## 2022-08-26 DIAGNOSIS — R278 Other lack of coordination: Secondary | ICD-10-CM | POA: Diagnosis not present

## 2022-08-26 DIAGNOSIS — S52502S Unspecified fracture of the lower end of left radius, sequela: Secondary | ICD-10-CM | POA: Diagnosis not present

## 2022-08-26 DIAGNOSIS — R2689 Other abnormalities of gait and mobility: Secondary | ICD-10-CM | POA: Diagnosis not present

## 2022-08-26 DIAGNOSIS — M6281 Muscle weakness (generalized): Secondary | ICD-10-CM | POA: Diagnosis not present

## 2022-08-26 DIAGNOSIS — R2681 Unsteadiness on feet: Secondary | ICD-10-CM | POA: Diagnosis not present

## 2022-08-26 DIAGNOSIS — S52602S Unspecified fracture of lower end of left ulna, sequela: Secondary | ICD-10-CM | POA: Diagnosis not present

## 2022-08-27 ENCOUNTER — Other Ambulatory Visit: Payer: Self-pay | Admitting: *Deleted

## 2022-08-27 NOTE — Patient Outreach (Signed)
Verified in [facility], Mrs. Mahajan discharged from [facility] SNF on 08/24/22. Returned to [facility].   No identifiable [facility] care coordination needs.   Marthenia Rolling, MSN, RN,BSN [place] Acute Care Coordinator [phone] (Direct dial)

## 2022-08-30 DIAGNOSIS — R2689 Other abnormalities of gait and mobility: Secondary | ICD-10-CM | POA: Diagnosis not present

## 2022-08-30 DIAGNOSIS — M6281 Muscle weakness (generalized): Secondary | ICD-10-CM | POA: Diagnosis not present

## 2022-08-30 DIAGNOSIS — Z9181 History of falling: Secondary | ICD-10-CM | POA: Diagnosis not present

## 2022-08-30 DIAGNOSIS — R2681 Unsteadiness on feet: Secondary | ICD-10-CM | POA: Diagnosis not present

## 2022-08-30 DIAGNOSIS — R278 Other lack of coordination: Secondary | ICD-10-CM | POA: Diagnosis not present

## 2022-08-30 DIAGNOSIS — S82402D Unspecified fracture of shaft of left fibula, subsequent encounter for closed fracture with routine healing: Secondary | ICD-10-CM | POA: Diagnosis not present

## 2022-09-04 DIAGNOSIS — S52502A Unspecified fracture of the lower end of left radius, initial encounter for closed fracture: Secondary | ICD-10-CM | POA: Diagnosis not present

## 2022-09-04 DIAGNOSIS — M25532 Pain in left wrist: Secondary | ICD-10-CM | POA: Diagnosis not present

## 2022-09-09 DIAGNOSIS — D469 Myelodysplastic syndrome, unspecified: Secondary | ICD-10-CM | POA: Diagnosis not present

## 2022-09-09 DIAGNOSIS — W19XXXD Unspecified fall, subsequent encounter: Secondary | ICD-10-CM | POA: Diagnosis not present

## 2022-09-09 DIAGNOSIS — I700 Atherosclerosis of aorta: Secondary | ICD-10-CM | POA: Diagnosis not present

## 2022-09-09 DIAGNOSIS — J449 Chronic obstructive pulmonary disease, unspecified: Secondary | ICD-10-CM | POA: Diagnosis not present

## 2022-09-10 ENCOUNTER — Emergency Department (HOSPITAL_BASED_OUTPATIENT_CLINIC_OR_DEPARTMENT_OTHER)
Admission: EM | Admit: 2022-09-10 | Discharge: 2022-09-10 | Disposition: A | Discharge status: Home / Self Care | Payer: Medicare Other | Attending: Emergency Medicine | Admitting: Emergency Medicine

## 2022-09-10 ENCOUNTER — Emergency Department (HOSPITAL_BASED_OUTPATIENT_CLINIC_OR_DEPARTMENT_OTHER): Payer: Medicare Other

## 2022-09-10 ENCOUNTER — Encounter (HOSPITAL_BASED_OUTPATIENT_CLINIC_OR_DEPARTMENT_OTHER): Payer: Self-pay

## 2022-09-10 ENCOUNTER — Other Ambulatory Visit: Payer: Self-pay

## 2022-09-10 DIAGNOSIS — S9001XA Contusion of right ankle, initial encounter: Secondary | ICD-10-CM | POA: Insufficient documentation

## 2022-09-10 DIAGNOSIS — N183 Chronic kidney disease, stage 3 unspecified: Secondary | ICD-10-CM | POA: Diagnosis not present

## 2022-09-10 DIAGNOSIS — X58XXXA Exposure to other specified factors, initial encounter: Secondary | ICD-10-CM | POA: Diagnosis not present

## 2022-09-10 DIAGNOSIS — S99911A Unspecified injury of right ankle, initial encounter: Secondary | ICD-10-CM

## 2022-09-10 DIAGNOSIS — M7989 Other specified soft tissue disorders: Secondary | ICD-10-CM | POA: Diagnosis present

## 2022-09-10 DIAGNOSIS — M25571 Pain in right ankle and joints of right foot: Secondary | ICD-10-CM | POA: Diagnosis not present

## 2022-09-10 MED ORDER — OXYCODONE-ACETAMINOPHEN 5-325 MG PO TABS
1.0000 | ORAL_TABLET | Freq: Once | ORAL | Status: AC
  Administered 2022-09-10: 1 via ORAL
  Filled 2022-09-10: qty 1

## 2022-09-10 NOTE — ED Triage Notes (Signed)
Patient here POV from Home.  Endorses, yesterday, possibly injuring her Right Ankle. Unsure how but today it became more painful.   NAD Noted during Triage. A&Ox4. GCS 15. BIB Personal Wheelchair.

## 2022-09-10 NOTE — ED Notes (Incomplete)
Pt discharged home after verbalizing understanding of discharge instructions; nad noted. 

## 2022-09-10 NOTE — Discharge Instructions (Addendum)
Please use Tylenol for pain.  You may use 1000 mg of Tylenol every 6 hours.  Not to exceed 4 g of Tylenol within 24 hours.  I recommend RICE, which stands for rest, ice, compression, and elevation of the affected extremity until swelling, pain resolves, you can take Tylenol up to every 6 hours to help with pain, and wear your ankle brace to help with support.

## 2022-09-10 NOTE — ED Provider Notes (Signed)
[facility] Provider Note   CSN: [id number] Arrival date & time: 09/10/22  1623     History  Chief Complaint  Patient presents with   Ankle Pain    Alison Bridges is a 89 y.o. female with past medical history significant for myelodysplastic syndrome, pancytopenia,, previous AKI, sepsis, stage III CKD who presents with concern for pain, swelling of the right ankle with no known injury.  Patient reports that she has been dealing with some issues with the left ankle since last month, she does not remember injuring the right ankle but she is concerned she may have hit it on her walker or wheelchair.  She denies any fall or other injury.   Ankle Pain      Home Medications Prior to Admission medications   Medication Sig Start Date End Date Taking? Authorizing Provider  Ascorbic Acid (VITAMIN C PO) Take 1 tablet by mouth daily.    [provider]  Azelastine HCl 137 MCG/SPRAY SOLN Place 1 spray into both nostrils daily as needed (allergies). 05/09/21   [provider]  b complex vitamins capsule Take 1 capsule by mouth daily.    [provider]  cholecalciferol (VITAMIN D) 1000 UNITS tablet Take 1,000 Units by mouth daily.    [provider]  Darbepoetin Alfa (ARANESP, ALBUMIN FREE,) 150 MCG/0.3ML SOSY injection Inject 150 mcg into the skin every 30 (thirty) days. 12-19-21 last dose given    [provider]  Dextromethorphan HBr (DELSYM PO) Take 10 mLs by mouth daily.    [provider]  guaiFENesin (MUCINEX) 600 MG 12 hr tablet Take 600 mg by mouth 2 (two) times daily.    [provider]  levothyroxine (SYNTHROID, LEVOTHROID) 125 MCG tablet Take 125 mcg by mouth daily before breakfast.    [provider]  nystatin (MYCOSTATIN) 100000 UNIT/ML suspension Take 5 mLs (500,000 Units total) by mouth 4 (four) times daily. Swish in the mouth for 1-2 mins then swallow 07/17/22   Alison Genera, MD  OVER THE COUNTER MEDICATION Take 1 capsule by mouth daily. Blood Builder    [provider]  oxyCODONE (OXY IR/ROXICODONE) 5 MG immediate release tablet Take 1 tablet (5 mg total) by mouth every 6 (six) hours as needed for moderate pain or severe pain. 07/26/22   Amin, [name], MD  pantoprazole (PROTONIX) 40 MG tablet Take 40 mg by mouth daily as needed (acid reflux).    [provider]  senna-docusate (SENOKOT-S) 8.6-50 MG tablet Take 1 tablet by mouth 2 (two) times daily as needed for mild constipation or moderate constipation. 07/26/22   Amin, [name], MD  sertraline (ZOLOFT) 100 MG tablet Take 100 mg by mouth daily. 03/13/20   [provider]  TRELEGY ELLIPTA 100-62.5-25 MCG/ACT AEPB Take 1 puff by mouth daily. 07/05/22   [provider]      Allergies    Patient has no known allergies.    Review of Systems   Review of Systems  Musculoskeletal:  Positive for arthralgias.  All other systems reviewed and are negative.   Physical Exam Updated Vital Signs BP (!) 157/81 (BP Location: Right Arm)   Pulse 93   Temp 98.8 F (37.1 C)   Resp 18   Ht '5\' 8"'$  (1.727 m)   Wt 56.2 kg   SpO2 98%   BMI 18.84 kg/m  Physical Exam Vitals and nursing note reviewed.  Constitutional:  General: She is not in acute distress.    Appearance: Normal appearance.  HENT:     Head: Normocephalic and atraumatic.  Eyes:     General:        Right eye: No discharge.        Left eye: No discharge.  Cardiovascular:     Rate and Rhythm: Normal rate and regular rhythm.     Pulses: Normal pulses.  Pulmonary:     Effort: Pulmonary effort is normal. No respiratory distress.  Musculoskeletal:        General: No deformity.     Comments: Patient with soft tissue swelling, bruising of the medial right ankle, normal ROM to dorsiflexion, plantarflexion, no step off or deformity  Skin:    General: Skin is warm and dry.     Capillary Refill:  Capillary refill takes less than 2 seconds.  Neurological:     Mental Status: She is alert and oriented to person, place, and time.  Psychiatric:        Mood and Affect: Mood normal.        Behavior: Behavior normal.     ED Results / Procedures / Treatments   Labs (all labs ordered are listed, but only abnormal results are displayed) Labs Reviewed - No data to display  EKG None  Radiology DG Ankle Complete Right  Result Date: 09/10/2022 CLINICAL DATA:  Right ankle pain.  Possible injury yesterday. EXAM: RIGHT ANKLE - COMPLETE 3+ VIEW COMPARISON:  Right ankle radiographs 12/08/2019 FINDINGS: There is diffuse decreased bone mineralization. The ankle mortise is symmetric and intact. Tiny plantar calcaneal heel spur. Possible mild lateral malleolar soft tissue swelling, similar to prior. No acute fracture is seen. No dislocation. IMPRESSION: Possible mild lateral malleolar soft tissue swelling. No acute fracture. Electronically Signed   By: Yvonne Kendall M.D.   On: 09/10/2022 17:37    Procedures Procedures    Medications Ordered in ED Medications  oxyCODONE-acetaminophen (PERCOCET/ROXICET) 5-325 MG per tablet 1 tablet (1 tablet Oral Given 09/10/22 1723)    ED Course/ Medical Decision Making/ A&P                             Medical Decision Making Amount and/or Complexity of Data Reviewed Radiology: ordered.   This patient is a 89 y.o. female who presents to the ED for concern of right ankle pain with possible injury noted by patient although some confusion over how it may have occurred  Differential diagnoses prior to evaluation: Ankle fracture, dislocation, versus soft tissue injury, bruising  Past Medical History / Social History / Additional history: Chart reviewed. Pertinent results include: Previous history of myelodysplastic syndrome, pancytopenia, patient versus she has easy bruising overall  Physical Exam: Physical exam performed. The pertinent findings include:  Patient with some soft tissue swelling, bruising of the right medial ankle, with normal range of motion, strength of the affected ankle, she has some pain with weightbearing.  She is neurovascularly intact throughout.  I independently interpreted plain film x-ray of the right ankle which shows no evidence of acute fracture, dislocation, did note some soft tissue swelling.  Medications / Treatment: Patient received 1 percocet in the emergency department, we will discharge with ASO brace, encouraged, and orthopedic follow-up as needed   Disposition: After consideration of the diagnostic results and the patients response to treatment, I feel that patient is stable for discharge with right ankle injury without evidence of fracture, dislocation as  discussed above.   emergency department workup does not suggest an emergent condition requiring admission or immediate intervention beyond what has been performed at this time. The plan is: as above. The patient is safe for discharge and has been instructed to return immediately for worsening symptoms, change in symptoms or any other concerns.  Final Clinical Impression(s) / ED Diagnoses Final diagnoses:  Injury of right ankle, initial encounter    Rx / DC Orders ED Discharge Orders     None         Dorien Chihuahua 09/10/22 1818    Regan Lemming, MD 09/10/22 2041

## 2022-09-11 ENCOUNTER — Other Ambulatory Visit (HOSPITAL_COMMUNITY): Payer: Self-pay | Admitting: *Deleted

## 2022-09-11 ENCOUNTER — Encounter: Payer: Self-pay | Admitting: Hematology

## 2022-09-11 DIAGNOSIS — D649 Anemia, unspecified: Secondary | ICD-10-CM

## 2022-09-12 ENCOUNTER — Encounter: Payer: Self-pay | Admitting: Hematology

## 2022-09-12 ENCOUNTER — Ambulatory Visit (HOSPITAL_COMMUNITY)
Admission: RE | Admit: 2022-09-12 | Discharge: 2022-09-12 | Disposition: A | Discharge status: Home / Self Care | Payer: Medicare Other | Source: Ambulatory Visit | Attending: Internal Medicine | Admitting: Internal Medicine

## 2022-09-12 VITALS — BP 143/94 | HR 90 | Temp 97.6°F | Resp 18 | Wt 114.0 lb

## 2022-09-12 DIAGNOSIS — D649 Anemia, unspecified: Secondary | ICD-10-CM | POA: Insufficient documentation

## 2022-09-12 LAB — CBC
HCT: 20.2 % — ABNORMAL LOW (ref 36.0–46.0)
Hemoglobin: 6.6 g/dL — CL (ref 12.0–15.0)
MCH: 32.7 pg (ref 26.0–34.0)
MCHC: 32.7 g/dL (ref 30.0–36.0)
MCV: 100 fL (ref 80.0–100.0)
Platelets: 37 10*3/uL — ABNORMAL LOW (ref 150–400)
RBC: 2.02 MIL/uL — ABNORMAL LOW (ref 3.87–5.11)
RDW: 19.8 % — ABNORMAL HIGH (ref 11.5–15.5)
WBC: 1.6 10*3/uL — ABNORMAL LOW (ref 4.0–10.5)
nRBC: 0 % (ref 0.0–0.2)

## 2022-09-12 LAB — PREPARE RBC (CROSSMATCH)

## 2022-09-12 MED ORDER — SODIUM CHLORIDE 0.9% IV SOLUTION: Freq: Once | INTRAVENOUS | Status: DC

## 2022-09-12 MED ORDER — FUROSEMIDE 20 MG PO TABS: 20.0000 mg | ORAL_TABLET | Freq: Once | ORAL | Status: DC

## 2022-09-12 NOTE — Progress Notes (Addendum)
Pt here for blood transfusion.  2u PRBCs ordered. Due to multiple antibodies, two units were not available at [facility] blood bank.  Pt to receive 1u PRBCs today and return tomorrow at 8am to receive the second unit that is being sent to [facility] blood bank.  Pt instructed to keep blue blood bank arm band on.  If removed, the type and screen process will have to be restarted.  Orders received to d/c lasix since only receiving 1 unit/day Pt's friend will be her transportation again for 8am appt on 09/13/22.  She was also instructed to reinforce to pt to make sure to keep blue arm band on

## 2022-09-13 ENCOUNTER — Ambulatory Visit (HOSPITAL_COMMUNITY)
Admission: RE | Admit: 2022-09-13 | Discharge: 2022-09-13 | Disposition: A | Discharge status: Home / Self Care | Payer: Medicare Other | Source: Ambulatory Visit | Attending: Internal Medicine | Admitting: Internal Medicine

## 2022-09-13 DIAGNOSIS — D649 Anemia, unspecified: Secondary | ICD-10-CM | POA: Diagnosis not present

## 2022-09-13 MED ORDER — SODIUM CHLORIDE 0.9% IV SOLUTION: Freq: Once | INTRAVENOUS | Status: DC

## 2022-09-16 ENCOUNTER — Emergency Department (HOSPITAL_COMMUNITY): Payer: Medicare Other

## 2022-09-16 ENCOUNTER — Inpatient Hospital Stay (HOSPITAL_COMMUNITY)
Admission: EM | Admit: 2022-09-16 | Discharge: 2022-09-20 | DRG: 194 | Disposition: A | Discharge status: Hospice | Payer: Medicare Other | Source: Skilled Nursing Facility | Attending: Internal Medicine | Admitting: Internal Medicine

## 2022-09-16 ENCOUNTER — Other Ambulatory Visit: Payer: Self-pay

## 2022-09-16 ENCOUNTER — Encounter (HOSPITAL_COMMUNITY): Payer: Self-pay

## 2022-09-16 DIAGNOSIS — Z66 Do not resuscitate: Secondary | ICD-10-CM | POA: Diagnosis present

## 2022-09-16 DIAGNOSIS — D708 Other neutropenia: Secondary | ICD-10-CM | POA: Diagnosis not present

## 2022-09-16 DIAGNOSIS — Z800 Family history of malignant neoplasm of digestive organs: Secondary | ICD-10-CM

## 2022-09-16 DIAGNOSIS — Z79890 Hormone replacement therapy (postmenopausal): Secondary | ICD-10-CM | POA: Diagnosis not present

## 2022-09-16 DIAGNOSIS — M545 Low back pain, unspecified: Secondary | ICD-10-CM | POA: Diagnosis not present

## 2022-09-16 DIAGNOSIS — Z9181 History of falling: Secondary | ICD-10-CM | POA: Diagnosis not present

## 2022-09-16 DIAGNOSIS — M1990 Unspecified osteoarthritis, unspecified site: Secondary | ICD-10-CM | POA: Diagnosis present

## 2022-09-16 DIAGNOSIS — M4126 Other idiopathic scoliosis, lumbar region: Secondary | ICD-10-CM | POA: Diagnosis not present

## 2022-09-16 DIAGNOSIS — Z7951 Long term (current) use of inhaled steroids: Secondary | ICD-10-CM

## 2022-09-16 DIAGNOSIS — Z8249 Family history of ischemic heart disease and other diseases of the circulatory system: Secondary | ICD-10-CM

## 2022-09-16 DIAGNOSIS — N189 Chronic kidney disease, unspecified: Secondary | ICD-10-CM | POA: Diagnosis not present

## 2022-09-16 DIAGNOSIS — S3992XA Unspecified injury of lower back, initial encounter: Secondary | ICD-10-CM | POA: Diagnosis not present

## 2022-09-16 DIAGNOSIS — Z79899 Other long term (current) drug therapy: Secondary | ICD-10-CM | POA: Diagnosis not present

## 2022-09-16 DIAGNOSIS — S22080A Wedge compression fracture of T11-T12 vertebra, initial encounter for closed fracture: Secondary | ICD-10-CM | POA: Diagnosis not present

## 2022-09-16 DIAGNOSIS — F32A Depression, unspecified: Secondary | ICD-10-CM | POA: Diagnosis not present

## 2022-09-16 DIAGNOSIS — N1832 Chronic kidney disease, stage 3b: Secondary | ICD-10-CM | POA: Diagnosis not present

## 2022-09-16 DIAGNOSIS — A419 Sepsis, unspecified organism: Secondary | ICD-10-CM

## 2022-09-16 DIAGNOSIS — S82402D Unspecified fracture of shaft of left fibula, subsequent encounter for closed fracture with routine healing: Secondary | ICD-10-CM | POA: Diagnosis not present

## 2022-09-16 DIAGNOSIS — A0472 Enterocolitis due to Clostridium difficile, not specified as recurrent: Secondary | ICD-10-CM | POA: Diagnosis not present

## 2022-09-16 DIAGNOSIS — Z515 Encounter for palliative care: Secondary | ICD-10-CM

## 2022-09-16 DIAGNOSIS — Z1152 Encounter for screening for COVID-19: Secondary | ICD-10-CM

## 2022-09-16 DIAGNOSIS — D649 Anemia, unspecified: Secondary | ICD-10-CM

## 2022-09-16 DIAGNOSIS — S52502S Unspecified fracture of the lower end of left radius, sequela: Secondary | ICD-10-CM | POA: Diagnosis not present

## 2022-09-16 DIAGNOSIS — Z043 Encounter for examination and observation following other accident: Secondary | ICD-10-CM | POA: Diagnosis not present

## 2022-09-16 DIAGNOSIS — Z820 Family history of epilepsy and other diseases of the nervous system: Secondary | ICD-10-CM

## 2022-09-16 DIAGNOSIS — M4316 Spondylolisthesis, lumbar region: Secondary | ICD-10-CM | POA: Diagnosis not present

## 2022-09-16 DIAGNOSIS — M1612 Unilateral primary osteoarthritis, left hip: Secondary | ICD-10-CM | POA: Diagnosis not present

## 2022-09-16 DIAGNOSIS — R059 Cough, unspecified: Secondary | ICD-10-CM | POA: Diagnosis not present

## 2022-09-16 DIAGNOSIS — K449 Diaphragmatic hernia without obstruction or gangrene: Secondary | ICD-10-CM | POA: Diagnosis not present

## 2022-09-16 DIAGNOSIS — M810 Age-related osteoporosis without current pathological fracture: Secondary | ICD-10-CM | POA: Diagnosis present

## 2022-09-16 DIAGNOSIS — S82832D Other fracture of upper and lower end of left fibula, subsequent encounter for closed fracture with routine healing: Secondary | ICD-10-CM

## 2022-09-16 DIAGNOSIS — D61818 Other pancytopenia: Secondary | ICD-10-CM | POA: Diagnosis present

## 2022-09-16 DIAGNOSIS — M4855XA Collapsed vertebra, not elsewhere classified, thoracolumbar region, initial encounter for fracture: Secondary | ICD-10-CM | POA: Diagnosis present

## 2022-09-16 DIAGNOSIS — D696 Thrombocytopenia, unspecified: Secondary | ICD-10-CM | POA: Diagnosis not present

## 2022-09-16 DIAGNOSIS — E441 Mild protein-calorie malnutrition: Secondary | ICD-10-CM | POA: Diagnosis present

## 2022-09-16 DIAGNOSIS — Z7189 Other specified counseling: Secondary | ICD-10-CM | POA: Diagnosis not present

## 2022-09-16 DIAGNOSIS — S52602S Unspecified fracture of lower end of left ulna, sequela: Secondary | ICD-10-CM | POA: Diagnosis not present

## 2022-09-16 DIAGNOSIS — Z833 Family history of diabetes mellitus: Secondary | ICD-10-CM

## 2022-09-16 DIAGNOSIS — W19XXXA Unspecified fall, initial encounter: Secondary | ICD-10-CM | POA: Diagnosis not present

## 2022-09-16 DIAGNOSIS — K219 Gastro-esophageal reflux disease without esophagitis: Secondary | ICD-10-CM | POA: Diagnosis present

## 2022-09-16 DIAGNOSIS — E039 Hypothyroidism, unspecified: Secondary | ICD-10-CM | POA: Diagnosis not present

## 2022-09-16 DIAGNOSIS — R2681 Unsteadiness on feet: Secondary | ICD-10-CM | POA: Diagnosis not present

## 2022-09-16 DIAGNOSIS — J189 Pneumonia, unspecified organism: Secondary | ICD-10-CM | POA: Diagnosis not present

## 2022-09-16 DIAGNOSIS — R278 Other lack of coordination: Secondary | ICD-10-CM | POA: Diagnosis not present

## 2022-09-16 DIAGNOSIS — D709 Neutropenia, unspecified: Secondary | ICD-10-CM | POA: Diagnosis not present

## 2022-09-16 DIAGNOSIS — R627 Adult failure to thrive: Secondary | ICD-10-CM | POA: Diagnosis present

## 2022-09-16 DIAGNOSIS — J159 Unspecified bacterial pneumonia: Secondary | ICD-10-CM | POA: Diagnosis not present

## 2022-09-16 DIAGNOSIS — M6281 Muscle weakness (generalized): Secondary | ICD-10-CM | POA: Diagnosis not present

## 2022-09-16 DIAGNOSIS — D469 Myelodysplastic syndrome, unspecified: Secondary | ICD-10-CM | POA: Diagnosis not present

## 2022-09-16 DIAGNOSIS — R070 Pain in throat: Secondary | ICD-10-CM | POA: Diagnosis not present

## 2022-09-16 DIAGNOSIS — R9431 Abnormal electrocardiogram [ECG] [EKG]: Secondary | ICD-10-CM | POA: Diagnosis not present

## 2022-09-16 DIAGNOSIS — J90 Pleural effusion, not elsewhere classified: Secondary | ICD-10-CM | POA: Diagnosis not present

## 2022-09-16 DIAGNOSIS — S32010D Wedge compression fracture of first lumbar vertebra, subsequent encounter for fracture with routine healing: Secondary | ICD-10-CM | POA: Diagnosis not present

## 2022-09-16 DIAGNOSIS — E876 Hypokalemia: Secondary | ICD-10-CM | POA: Diagnosis not present

## 2022-09-16 DIAGNOSIS — M549 Dorsalgia, unspecified: Secondary | ICD-10-CM | POA: Diagnosis not present

## 2022-09-16 DIAGNOSIS — R2689 Other abnormalities of gait and mobility: Secondary | ICD-10-CM | POA: Diagnosis not present

## 2022-09-16 DIAGNOSIS — Z681 Body mass index (BMI) 19 or less, adult: Secondary | ICD-10-CM

## 2022-09-16 DIAGNOSIS — R296 Repeated falls: Secondary | ICD-10-CM | POA: Diagnosis present

## 2022-09-16 DIAGNOSIS — D702 Other drug-induced agranulocytosis: Secondary | ICD-10-CM

## 2022-09-16 DIAGNOSIS — M4317 Spondylolisthesis, lumbosacral region: Secondary | ICD-10-CM | POA: Diagnosis not present

## 2022-09-16 DIAGNOSIS — Z5189 Encounter for other specified aftercare: Secondary | ICD-10-CM

## 2022-09-16 DIAGNOSIS — R410 Disorientation, unspecified: Secondary | ICD-10-CM | POA: Diagnosis not present

## 2022-09-16 DIAGNOSIS — S62102D Fracture of unspecified carpal bone, left wrist, subsequent encounter for fracture with routine healing: Secondary | ICD-10-CM

## 2022-09-16 DIAGNOSIS — M4856XD Collapsed vertebra, not elsewhere classified, lumbar region, subsequent encounter for fracture with routine healing: Secondary | ICD-10-CM | POA: Diagnosis not present

## 2022-09-16 DIAGNOSIS — Z8559 Personal history of malignant neoplasm of other urinary tract organ: Secondary | ICD-10-CM

## 2022-09-16 DIAGNOSIS — M4854XD Collapsed vertebra, not elsewhere classified, thoracic region, subsequent encounter for fracture with routine healing: Secondary | ICD-10-CM | POA: Diagnosis not present

## 2022-09-16 DIAGNOSIS — D759 Disease of blood and blood-forming organs, unspecified: Secondary | ICD-10-CM

## 2022-09-16 DIAGNOSIS — Z4789 Encounter for other orthopedic aftercare: Secondary | ICD-10-CM | POA: Diagnosis not present

## 2022-09-16 DIAGNOSIS — Z7401 Bed confinement status: Secondary | ICD-10-CM | POA: Diagnosis not present

## 2022-09-16 DIAGNOSIS — R509 Fever, unspecified: Secondary | ICD-10-CM | POA: Diagnosis not present

## 2022-09-16 DIAGNOSIS — S32010A Wedge compression fracture of first lumbar vertebra, initial encounter for closed fracture: Secondary | ICD-10-CM | POA: Insufficient documentation

## 2022-09-16 LAB — COMPREHENSIVE METABOLIC PANEL
ALT: 17 U/L (ref 0–44)
AST: 20 U/L (ref 15–41)
Albumin: 2.7 g/dL — ABNORMAL LOW (ref 3.5–5.0)
Alkaline Phosphatase: 84 U/L (ref 38–126)
Anion gap: 7 (ref 5–15)
BUN: 39 mg/dL — ABNORMAL HIGH (ref 8–23)
CO2: 19 mmol/L — ABNORMAL LOW (ref 22–32)
Calcium: 7.9 mg/dL — ABNORMAL LOW (ref 8.9–10.3)
Chloride: 106 mmol/L (ref 98–111)
Creatinine, Ser: 1.58 mg/dL — ABNORMAL HIGH (ref 0.44–1.00)
GFR, Estimated: 31 mL/min — ABNORMAL LOW (ref >60)
Glucose, Bld: 109 mg/dL — ABNORMAL HIGH (ref 70–99)
Potassium: 3.8 mmol/L (ref 3.5–5.1)
Sodium: 132 mmol/L — ABNORMAL LOW (ref 135–145)
Total Bilirubin: 1.2 mg/dL (ref 0.3–1.2)
Total Protein: 6.9 g/dL (ref 6.5–8.1)

## 2022-09-16 LAB — CBC WITH DIFFERENTIAL/PLATELET
Abs Immature Granulocytes: 0.06 10*3/uL (ref 0.00–0.07)
Basophils Absolute: 0 10*3/uL (ref 0.0–0.1)
Basophils Relative: 0 %
Eosinophils Absolute: 0 10*3/uL (ref 0.0–0.5)
Eosinophils Relative: 0 %
HCT: 22.5 % — ABNORMAL LOW (ref 36.0–46.0)
Hemoglobin: 7.3 g/dL — ABNORMAL LOW (ref 12.0–15.0)
Immature Granulocytes: 3 %
Lymphocytes Relative: 33 %
Lymphs Abs: 0.6 10*3/uL — ABNORMAL LOW (ref 0.7–4.0)
MCH: 32.2 pg (ref 26.0–34.0)
MCHC: 32.4 g/dL (ref 30.0–36.0)
MCV: 99.1 fL (ref 80.0–100.0)
Monocytes Absolute: 0.5 10*3/uL (ref 0.1–1.0)
Monocytes Relative: 29 %
Neutro Abs: 0.7 10*3/uL — ABNORMAL LOW (ref 1.7–7.7)
Neutrophils Relative %: 35 %
Platelets: 21 10*3/uL — CL (ref 150–400)
RBC: 2.27 MIL/uL — ABNORMAL LOW (ref 3.87–5.11)
RDW: 18.6 % — ABNORMAL HIGH (ref 11.5–15.5)
WBC: 1.9 10*3/uL — ABNORMAL LOW (ref 4.0–10.5)
nRBC: 0 % (ref 0.0–0.2)

## 2022-09-16 LAB — RESP PANEL BY RT-PCR (RSV, FLU A&B, COVID)  RVPGX2
Influenza A by PCR: NEGATIVE
Influenza B by PCR: NEGATIVE
Resp Syncytial Virus by PCR: NEGATIVE
SARS Coronavirus 2 by RT PCR: NEGATIVE

## 2022-09-16 LAB — LACTIC ACID, PLASMA: Lactic Acid, Venous: 0.8 mmol/L (ref 0.5–1.9)

## 2022-09-16 LAB — APTT: aPTT: 52 seconds — ABNORMAL HIGH (ref 24–36)

## 2022-09-16 LAB — PROTIME-INR
INR: 1.4 — ABNORMAL HIGH (ref 0.8–1.2)
Prothrombin Time: 17.2 seconds — ABNORMAL HIGH (ref 11.4–15.2)

## 2022-09-16 MED ORDER — SODIUM CHLORIDE 0.9 % IV SOLN
1.0000 g | Freq: Once | INTRAVENOUS | Status: AC
  Administered 2022-09-16: 1 g via INTRAVENOUS
  Filled 2022-09-16: qty 10

## 2022-09-16 MED ORDER — HYDROCODONE-ACETAMINOPHEN 5-325 MG PO TABS
1.0000 | ORAL_TABLET | Freq: Once | ORAL | Status: AC
  Administered 2022-09-16: 1 via ORAL
  Filled 2022-09-16: qty 1

## 2022-09-16 MED ORDER — LACTATED RINGERS IV BOLUS
500.0000 mL | Freq: Once | INTRAVENOUS | Status: AC
  Administered 2022-09-16: 500 mL via INTRAVENOUS

## 2022-09-16 MED ORDER — SODIUM CHLORIDE 0.9 % IV SOLN
500.0000 mg | Freq: Once | INTRAVENOUS | Status: AC
  Administered 2022-09-16: 500 mg via INTRAVENOUS
  Filled 2022-09-16: qty 5

## 2022-09-16 NOTE — ED Notes (Signed)
Ortho Tech will bring TLSO brace in the morning.

## 2022-09-16 NOTE — ED Notes (Signed)
Date and time results received: 09/16/22 8:58 PM  (use smartphrase ".now" to insert current time)  Test: Platelets Critical Value: 21  Name of Provider Notified: Regenia Skeeter  Orders Received? Or Actions Taken?: Orders Received - See Orders for details

## 2022-09-16 NOTE — ED Notes (Signed)
Patient transported to X-ray 

## 2022-09-16 NOTE — ED Provider Notes (Signed)
[facility] EMERGENCY DEPARTMENT AT [facility] Provider Note   CSN: [id number] Arrival date & time: 09/16/22  1641     History  Chief Complaint  Patient presents with   Foot Pain    Right foot, fell last week    Alison Bridges is a 89 y.o. female.  HPI 89 year old female presents with a fever.  Reportedly had a low-grade fever at her assisted living facility.  She is in independent living.  She has been having a cough, sore throat, wheezing and shortness of breath for a few days.  She received Tylenol prior to EMS arrival.  She is also been dealing with some left buttock/low back pain after a fall last week that resulted in a right ankle sprain.  She has been applying numbing patches to this area but it still hurts and there is some bruising.  Home Medications Prior to Admission medications   Medication Sig Start Date End Date Taking? Authorizing Provider  Ascorbic Acid (VITAMIN C PO) Take 1 tablet by mouth daily.    [provider]  Azelastine HCl 137 MCG/SPRAY SOLN Place 1 spray into both nostrils daily as needed (allergies). 05/09/21   [provider]  b complex vitamins capsule Take 1 capsule by mouth daily.    [provider]  cholecalciferol (VITAMIN D) 1000 UNITS tablet Take 1,000 Units by mouth daily.    [provider]  Darbepoetin Alfa (ARANESP, ALBUMIN FREE,) 150 MCG/0.3ML SOSY injection Inject 150 mcg into the skin every 30 (thirty) days. 12-19-21 last dose given    [provider]  Dextromethorphan HBr (DELSYM PO) Take 10 mLs by mouth daily.    [provider]  guaiFENesin (MUCINEX) 600 MG 12 hr tablet Take 600 mg by mouth 2 (two) times daily.    [provider]  levothyroxine (SYNTHROID, LEVOTHROID) 125 MCG tablet Take 125 mcg by mouth daily before breakfast.    [provider]  nystatin (MYCOSTATIN) 100000 UNIT/ML suspension Take 5 mLs (500,000 Units total) by mouth 4 (four) times daily.  Swish in the mouth for 1-2 mins then swallow 07/17/22   [name], MD  OVER THE COUNTER MEDICATION Take 1 capsule by mouth daily. Blood Builder    [provider]  oxyCODONE (OXY IR/ROXICODONE) 5 MG immediate release tablet Take 1 tablet (5 mg total) by mouth every 6 (six) hours as needed for moderate pain or severe pain. 07/26/22   Amin, [name], MD  pantoprazole (PROTONIX) 40 MG tablet Take 40 mg by mouth daily as needed (acid reflux).    [provider]  senna-docusate (SENOKOT-S) 8.6-50 MG tablet Take 1 tablet by mouth 2 (two) times daily as needed for mild constipation or moderate constipation. 07/26/22   Amin, [name], MD  sertraline (ZOLOFT) 100 MG tablet Take 100 mg by mouth daily. 03/13/20   [provider]  TRELEGY ELLIPTA 100-62.5-25 MCG/ACT AEPB Take 1 puff by mouth daily. 07/05/22   [provider]      Allergies    Patient has no known allergies.    Review of Systems   Review of Systems  Constitutional:  Positive for fever.  HENT:  Positive for sore throat.   Respiratory:  Positive for cough and shortness of breath.   Musculoskeletal:  Positive for arthralgias.    Physical Exam Updated Vital Signs BP (!) 141/60   Pulse 97   Temp 98.4 F (36.9 C) (Oral)   Resp 18   Ht 5'  8" (1.727 m)   Wt 51.7 kg   SpO2 97%   BMI 17.33 kg/m  Physical Exam Vitals and nursing note reviewed.  Constitutional:      General: She is not in acute distress.    Appearance: She is well-developed. She is not ill-appearing or diaphoretic.  HENT:     Head: Normocephalic and atraumatic.     Mouth/Throat:     Mouth: Mucous membranes are dry.  Cardiovascular:     Rate and Rhythm: Regular rhythm. Tachycardia present.     Pulses:          Dorsalis pedis pulses are 2+ on the left side.     Heart sounds: Normal heart sounds.     Comments: HR~100 Pulmonary:     Effort: Pulmonary effort is normal.     Comments: Coarse breath sounds without  overt wheezing Abdominal:     Palpations: Abdomen is soft.     Tenderness: There is no abdominal tenderness.  Musculoskeletal:     Lumbar back: Tenderness present.     Left hip: No tenderness. Normal range of motion.     Right lower leg: No edema.     Left lower leg: No edema.       Legs:  Skin:    General: Skin is warm and dry.  Neurological:     Mental Status: She is alert.     ED Results / Procedures / Treatments   Labs (all labs ordered are listed, but only abnormal results are displayed) Labs Reviewed  COMPREHENSIVE METABOLIC PANEL - Abnormal; Notable for the following components:      Result Value   Sodium 132 (*)    CO2 19 (*)    Glucose, Bld 109 (*)    BUN 39 (*)    Creatinine, Ser 1.58 (*)    Calcium 7.9 (*)    Albumin 2.7 (*)    GFR, Estimated 31 (*)    All other components within normal limits  CBC WITH DIFFERENTIAL/PLATELET - Abnormal; Notable for the following components:   WBC 1.9 (*)    RBC 2.27 (*)    Hemoglobin 7.3 (*)    HCT 22.5 (*)    RDW 18.6 (*)    Platelets 21 (*)    Neutro Abs 0.7 (*)    Lymphs Abs 0.6 (*)    All other components within normal limits  PROTIME-INR - Abnormal; Notable for the following components:   Prothrombin Time 17.2 (*)    INR 1.4 (*)    All other components within normal limits  APTT - Abnormal; Notable for the following components:   aPTT 52 (*)    All other components within normal limits  RESP PANEL BY RT-PCR (RSV, FLU A&B, COVID)  [id number]  CULTURE, BLOOD (ROUTINE X 2)  CULTURE, BLOOD (ROUTINE X 2)  LACTIC ACID, PLASMA  URINALYSIS, W/ REFLEX TO CULTURE (INFECTION SUSPECTED)    EKG EKG Interpretation  Date/Time:  Monday September 16 2022 19:26:39 EST Ventricular Rate:  91 PR Interval:  125 QRS Duration: 76 QT Interval:  363 QTC Calculation: 447 R Axis:   6 Text Interpretation: Sinus rhythm no acute ST/T changes Confirmed by Sherwood Gambler [phone]) on 09/16/2022 7:45:16 PM  Radiology DG Lumbar Spine  Complete  Result Date: 09/16/2022 CLINICAL DATA:  Fall, back pain EXAM: LUMBAR SPINE - COMPLETE 4+ VIEW COMPARISON:  CT 07/21/2022 FINDINGS: There are compression deformities of T12 with 20-30% loss of height and anteriorly at L1 with 50% loss of  height. Both fractures appear new since prior CT examination and fracture in T12 is acute to subacute in nature with widening of the paraspinal soft tissues likely related to surrounding edema or hemorrhage. No retropulsion. The posterior wall of the vertebral bodies appear intact on this limited examination. Remaining vertebral body height is preserved. Stable grade 2 anterolisthesis L5-S1. Intervertebral disc space narrowing and endplate remodeling of D34-534 again noted in keeping with changes of moderate to severe degenerative disc disease. Facet arthrosis at L5-S1 is not well profiled on this examination. Stable levoscoliosis of the lumbar spine, apex left at L5. Interval development of a mild dextrocurvature of the thoracolumbar spine, apex right at L1. IMPRESSION: 1. Suspected acute to subacute compression fracture of T12 with 50% loss of height. No retropulsion. Age indeterminate fracture L1 with 20-30% loss of height. Acuity could be confirmed with MRI examination if indicated. 2. Stable grade 2 anterolisthesis L5-S1. 3. Stable moderate to severe degenerative disc disease L3-S1. Electronically Signed   By: Fidela Salisbury M.D.   On: 09/16/2022 19:34   DG Chest 2 View  Result Date: 09/16/2022 CLINICAL DATA:  Cough EXAM: CHEST - 2 VIEW COMPARISON:  Chest x-ray 01/13/2022.  Chest CT 07/21/2022. FINDINGS: There are faint multifocal patchy airspace opacities in the right mid lung and left lower lung. Small bilateral pleural effusions. Aorta is ectatic. Cardiac silhouette is within normal limits. Hiatal hernia present. No acute fractures. IMPRESSION: 1. Faint multifocal patchy airspace opacities in the right mid lung and left lower lung concerning for multifocal  pneumonia. 2. Small bilateral pleural effusions. Electronically Signed   By: Ronney Asters M.D.   On: 09/16/2022 19:29   DG Hip Unilat W or Wo Pelvis 2-3 Views Left  Result Date: 09/16/2022 CLINICAL DATA:  Fall EXAM: DG HIP (WITH OR WITHOUT PELVIS) 2-3V LEFT COMPARISON:  None Available. FINDINGS: Normal alignment. No acute fracture or dislocation. Degenerative changes are seen at the lumbosacral junction and hips bilaterally, right greater than left. Soft tissues are unremarkable. IMPRESSION: 1. Degenerative change. No acute fracture or dislocation. Electronically Signed   By: Fidela Salisbury M.D.   On: 09/16/2022 19:28    Procedures Procedures    Medications Ordered in ED Medications  azithromycin (ZITHROMAX) 500 mg in sodium chloride 0.9 % 250 mL IVPB (500 mg Intravenous New Bag/Given 09/16/22 2032)  HYDROcodone-acetaminophen (NORCO/VICODIN) 5-325 MG per tablet 1 tablet (has no administration in time range)  lactated ringers bolus 500 mL (0 mLs Intravenous Stopped 09/16/22 2033)  cefTRIAXone (ROCEPHIN) 1 g in sodium chloride 0.9 % 100 mL IVPB (0 g Intravenous Stopped 09/16/22 2109)    ED Course/ Medical Decision Making/ A&P                             Medical Decision Making Amount and/or Complexity of Data Reviewed Labs: ordered.    Details: Chronic pancytopenia Normal lactate Mild AKI  Radiology: ordered and independent interpretation performed.    Details: Bilateral pneumonia T12, L1 fractures ECG/medicine tests: ordered and independent interpretation performed.    Details: No acute ischemia  Risk Prescription drug management.   Patient's respiratory symptoms are probably from multifocal pneumonia.  Was febrile at her facility but not now.  Labs show some minimal AKI but otherwise are reassuring and stable compared to baseline.  However she is also having intractable back pain that appears to be coming from a new T12 fracture.  She has taken Tylenol without significant relief,  will give a dose of hydrocodone.  While she is not septic I think she will need admission for multifocal pneumonia and I have given her IV antibiotics.  Will also give the pain relief.  She will need admission and I discussed with hospitalist, Dr. Susa Simmonds. Will also order TLSO for comfort.        Final Clinical Impression(s) / ED Diagnoses Final diagnoses:  Multifocal pneumonia  T12 compression fracture, initial encounter [facility])    Rx / DC Orders ED Discharge Orders     None         Sherwood Gambler, MD 09/16/22 2125

## 2022-09-16 NOTE — ED Notes (Signed)
ED TO INPATIENT HANDOFF REPORT  Name/Age/Gender Alison Bridges 89 y.o. female  Code Status Code Status History     Date Active Date Inactive Code Status Order ID Comments User Context   07/21/2022 1656 07/27/2022 1623 DNR [id number]  [name], MD Inpatient   01/13/2022 1943 01/14/2022 2140 DNR [id number]  [name], MD ED   03/22/2020 2312 03/25/2020 2202 Full Code [id number]  [name], DO ED   12/08/2019 1617 12/10/2019 1746 Full Code [id number]  [name], MD ED    Questions for Most Recent Historical Code Status (Order [id number])     Question Answer   If patient has no pulse and is not breathing Do Not Attempt Resuscitation   If patient has a pulse and/or is breathing: Medical Treatment Goals LIMITED ADDITIONAL INTERVENTIONS: Use medication/IV fluids and cardiac monitoring as indicated; Do not use intubation or mechanical ventilation (DNI), also provide comfort medications.  Transfer to Progressive/Stepdown as indicated, avoid Intensive Care.   Consent: Discussion documented in EHR or advanced directives reviewed            Home/SNF/Other Skilled nursing facility  Chief Complaint CAP (community acquired pneumonia) [J18.9]  Level of Care/Admitting Diagnosis ED Disposition     ED Disposition  Admit   Condition  --   [place]: [place] [[id number]]  Level of Care: Med-Surg [16]  May place patient in observation at [facility] or [place] if equivalent level of care is available:: Yes  Covid Evaluation: Confirmed COVID Negative  Diagnosis: CAP (community acquired pneumonia) [id number]  Admitting Physician: Lyndee Hensen [id number]  Attending Physician: Lyndee Hensen [id number]          Medical History Past Medical History:  Diagnosis Date   Anemia    Arthritis    C. difficile diarrhea    Cancer ([place])    Chronic kidney disease    Depression    Dyspnea    Shortness of breath with low hemoglobin    GERD (gastroesophageal reflux disease)    Hypothyroidism    Myelodysplastic disease ([place])    Osteoporosis     Allergies No Known Allergies  IV Location/Drains/Wounds Patient Lines/Drains/Airways Status     Active Line/Drains/Airways     Name Placement date Placement time Site Days   Peripheral IV 09/16/22 18 G Anterior;Distal;Right Forearm 09/16/22  1907  Forearm  less than 1   External Urinary Catheter 07/23/22  1100  --  55   Wound / Incision (Open or Dehisced) 07/21/22 Laceration Arm Anterior;Left;Lower 07/21/22  2003  Arm  57            Labs/Imaging Results for orders placed or performed during the hospital encounter of 09/16/22 (from the past 48 hour(s))  Lactic acid, plasma     Status: None   Collection Time: 09/16/22  6:40 PM  Result Value Ref Range   Lactic Acid, Venous 0.8 0.5 - 1.9 mmol/L    Comment: Performed at [facility], [place] [address], [place], [place] [address]  Comprehensive metabolic panel     Status: Abnormal   Collection Time: 09/16/22  6:40 PM  Result Value Ref Range   Sodium 132 (L) 135 - 145 mmol/L   Potassium 3.8 3.5 - 5.1 mmol/L   Chloride 106 98 - 111 mmol/L   CO2 19 (L) 22 - 32 mmol/L   Glucose, Bld 109 (H) 70 - 99 mg/dL    Comment: Glucose reference range applies only to  samples taken after fasting for at least 8 hours.   BUN 39 (H) 8 - 23 mg/dL   Creatinine, Ser 1.58 (H) 0.44 - 1.00 mg/dL   Calcium 7.9 (L) 8.9 - 10.3 mg/dL   Total Protein 6.9 6.5 - 8.1 g/dL   Albumin 2.7 (L) 3.5 - 5.0 g/dL   AST 20 15 - 41 U/L   ALT 17 0 - 44 U/L   Alkaline Phosphatase 84 38 - 126 U/L   Total Bilirubin 1.2 0.3 - 1.2 mg/dL   GFR, Estimated 31 (L) >60 mL/min    Comment: (NOTE) Calculated using the CKD-EPI Creatinine Equation (2021)    Anion gap 7 5 - 15    Comment: Performed at [facility], [place] [address] [place], [place] [address]  CBC with Differential     Status: Abnormal   Collection Time: 09/16/22  6:40  PM  Result Value Ref Range   WBC 1.9 (L) 4.0 - 10.5 K/uL   RBC 2.27 (L) 3.87 - 5.11 MIL/uL   Hemoglobin 7.3 (L) 12.0 - 15.0 g/dL   HCT 22.5 (L) 36.0 - 46.0 %   MCV 99.1 80.0 - 100.0 fL   MCH 32.2 26.0 - 34.0 pg   MCHC 32.4 30.0 - 36.0 g/dL   RDW 18.6 (H) 11.5 - 15.5 %   Platelets 21 (LL) 150 - 400 K/uL    Comment: SPECIMEN CHECKED FOR CLOTS Immature Platelet Fraction may be clinically indicated, consider ordering this additional test [id number] REPEATED TO VERIFY THIS CRITICAL RESULT HAS VERIFIED AND BEEN CALLED TO LACIVITA,H RN BY MAHMOUD,SELMA ON 02 12 2024 AT 2102, AND HAS BEEN READ BACK.     nRBC 0.0 0.0 - 0.2 %   Neutrophils Relative % 35 %   Neutro Abs 0.7 (L) 1.7 - 7.7 K/uL   Lymphocytes Relative 33 %   Lymphs Abs 0.6 (L) 0.7 - 4.0 K/uL   Monocytes Relative 29 %   Monocytes Absolute 0.5 0.1 - 1.0 K/uL   Eosinophils Relative 0 %   Eosinophils Absolute 0.0 0.0 - 0.5 K/uL   Basophils Relative 0 %   Basophils Absolute 0.0 0.0 - 0.1 K/uL   Immature Granulocytes 3 %   Abs Immature Granulocytes 0.06 0.00 - 0.07 K/uL    Comment: Performed at [facility], [place] [address], [place], [place] [address]  Protime-INR     Status: Abnormal   Collection Time: 09/16/22  6:40 PM  Result Value Ref Range   Prothrombin Time 17.2 (H) 11.4 - 15.2 seconds   INR 1.4 (H) 0.8 - 1.2    Comment: (NOTE) INR goal varies based on device and disease states. Performed at [facility], [place] [address], [place], [place] [address]   APTT     Status: Abnormal   Collection Time: 09/16/22  6:40 PM  Result Value Ref Range   aPTT 52 (H) 24 - 36 seconds    Comment:        IF BASELINE aPTT IS ELEVATED, SUGGEST PATIENT RISK ASSESSMENT BE USED TO DETERMINE APPROPRIATE ANTICOAGULANT THERAPY. Performed at [facility], [place] [address] [place], [place] [address]   Resp panel by RT-PCR (RSV, Flu A&B, Covid) Anterior Nasal Swab     Status: None    Collection Time: 09/16/22  7:05 PM   Specimen: Anterior Nasal Swab  Result Value Ref Range   SARS Coronavirus 2 by RT PCR NEGATIVE NEGATIVE    Comment: (NOTE) SARS-CoV-2 target nucleic  acids are NOT DETECTED.  The SARS-CoV-2 RNA is generally detectable in upper respiratory specimens during the acute phase of infection. The lowest concentration of SARS-CoV-2 viral copies this assay can detect is 138 copies/mL. A negative result does not preclude SARS-Cov-2 infection and should not be used as the sole basis for treatment or other patient management decisions. A negative result may occur with  improper specimen collection/handling, submission of specimen other than nasopharyngeal swab, presence of viral mutation(s) within the areas targeted by this assay, and inadequate number of viral copies(<138 copies/mL). A negative result must be combined with clinical observations, patient history, and epidemiological information. The expected result is Negative.  Fact Sheet for Patients:  EntrepreneurPulse.com.au  Fact Sheet for Healthcare Providers:  IncredibleEmployment.be  This test is no t yet approved or cleared by the [place] FDA and  has been authorized for detection and/or diagnosis of SARS-CoV-2 by FDA under an Emergency Use Authorization (EUA). This EUA will remain  in effect (meaning this test can be used) for the duration of the COVID-19 declaration under Section 564(b)(1) of the Act, 21 U.S.C.section 360bbb-3(b)(1), unless the authorization is terminated  or revoked sooner.       Influenza A by PCR NEGATIVE NEGATIVE   Influenza B by PCR NEGATIVE NEGATIVE    Comment: (NOTE) The Xpert Xpress SARS-CoV-2/FLU/RSV plus assay is intended as an aid in the diagnosis of influenza from Nasopharyngeal swab specimens and should not be used as a sole basis for treatment. Nasal washings and aspirates are unacceptable for Xpert Xpress  SARS-CoV-2/FLU/RSV testing.  Fact Sheet for Patients: EntrepreneurPulse.com.au  Fact Sheet for Healthcare Providers: IncredibleEmployment.be  This test is not yet approved or cleared by the [place] FDA and has been authorized for detection and/or diagnosis of SARS-CoV-2 by FDA under an Emergency Use Authorization (EUA). This EUA will remain in effect (meaning this test can be used) for the duration of the COVID-19 declaration under Section 564(b)(1) of the Act, 21 U.S.C. section 360bbb-3(b)(1), unless the authorization is terminated or revoked.     Resp Syncytial Virus by PCR NEGATIVE NEGATIVE    Comment: (NOTE) Fact Sheet for Patients: EntrepreneurPulse.com.au  Fact Sheet for Healthcare Providers: IncredibleEmployment.be  This test is not yet approved or cleared by the [place] FDA and has been authorized for detection and/or diagnosis of SARS-CoV-2 by FDA under an Emergency Use Authorization (EUA). This EUA will remain in effect (meaning this test can be used) for the duration of the COVID-19 declaration under Section 564(b)(1) of the Act, 21 U.S.C. section 360bbb-3(b)(1), unless the authorization is terminated or revoked.  Performed at [facility], [place] [address], [place], [place] [address]    DG Lumbar Spine Complete  Result Date: 09/16/2022 CLINICAL DATA:  Fall, back pain EXAM: LUMBAR SPINE - COMPLETE 4+ VIEW COMPARISON:  CT 07/21/2022 FINDINGS: There are compression deformities of T12 with 20-30% loss of height and anteriorly at L1 with 50% loss of height. Both fractures appear new since prior CT examination and fracture in T12 is acute to subacute in nature with widening of the paraspinal soft tissues likely related to surrounding edema or hemorrhage. No retropulsion. The posterior wall of the vertebral bodies appear intact on this limited examination. Remaining  vertebral body height is preserved. Stable grade 2 anterolisthesis L5-S1. Intervertebral disc space narrowing and endplate remodeling of D34-534 again noted in keeping with changes of moderate to severe degenerative disc disease. Facet arthrosis at L5-S1 is not well profiled on this examination.  Stable levoscoliosis of the lumbar spine, apex left at L5. Interval development of a mild dextrocurvature of the thoracolumbar spine, apex right at L1. IMPRESSION: 1. Suspected acute to subacute compression fracture of T12 with 50% loss of height. No retropulsion. Age indeterminate fracture L1 with 20-30% loss of height. Acuity could be confirmed with MRI examination if indicated. 2. Stable grade 2 anterolisthesis L5-S1. 3. Stable moderate to severe degenerative disc disease L3-S1. Electronically Signed   By: Fidela Salisbury M.D.   On: 09/16/2022 19:34   DG Chest 2 View  Result Date: 09/16/2022 CLINICAL DATA:  Cough EXAM: CHEST - 2 VIEW COMPARISON:  Chest x-ray 01/13/2022.  Chest CT 07/21/2022. FINDINGS: There are faint multifocal patchy airspace opacities in the right mid lung and left lower lung. Small bilateral pleural effusions. Aorta is ectatic. Cardiac silhouette is within normal limits. Hiatal hernia present. No acute fractures. IMPRESSION: 1. Faint multifocal patchy airspace opacities in the right mid lung and left lower lung concerning for multifocal pneumonia. 2. Small bilateral pleural effusions. Electronically Signed   By: Ronney Asters M.D.   On: 09/16/2022 19:29   DG Hip Unilat W or Wo Pelvis 2-3 Views Left  Result Date: 09/16/2022 CLINICAL DATA:  Fall EXAM: DG HIP (WITH OR WITHOUT PELVIS) 2-3V LEFT COMPARISON:  None Available. FINDINGS: Normal alignment. No acute fracture or dislocation. Degenerative changes are seen at the lumbosacral junction and hips bilaterally, right greater than left. Soft tissues are unremarkable. IMPRESSION: 1. Degenerative change. No acute fracture or dislocation. Electronically  Signed   By: Fidela Salisbury M.D.   On: 09/16/2022 19:28    Pending Labs Unresulted Labs (From admission, onward)     Start     Ordered   09/16/22 1837  Blood Culture (routine x 2)  (Undifferentiated presentation (screening labs and basic nursing orders))  BLOOD CULTURE X 2,   STAT      09/16/22 1837   09/16/22 1837  Urinalysis, w/ Reflex to Culture (Infection Suspected) -Urine, Clean Catch  (Undifferentiated presentation (screening labs and basic nursing orders))  Once,   URGENT       Question:  Specimen Source  Answer:  Urine, Clean Catch   09/16/22 1837            Vitals/Pain Today's Vitals   09/16/22 1712 09/16/22 1713 09/16/22 1950 09/16/22 2000  BP:   (!) 154/59 (!) 141/60  Pulse:   93 97  Resp:   17 18  Temp:      TempSrc:      SpO2: 99%  99% 97%  Weight:  51.7 kg    Height:  5' 8"$  (1.727 m)      Isolation Precautions No active isolations  Medications Medications  lactated ringers bolus 500 mL (0 mLs Intravenous Stopped 09/16/22 2033)  cefTRIAXone (ROCEPHIN) 1 g in sodium chloride 0.9 % 100 mL IVPB (0 g Intravenous Stopped 09/16/22 2109)  azithromycin (ZITHROMAX) 500 mg in sodium chloride 0.9 % 250 mL IVPB (0 mg Intravenous Stopped 09/16/22 2136)  HYDROcodone-acetaminophen (NORCO/VICODIN) 5-325 MG per tablet 1 tablet (1 tablet Oral Given 09/16/22 2134)    Mobility non-ambulatory, Patient will receive TLSO tomorrow

## 2022-09-17 ENCOUNTER — Other Ambulatory Visit: Payer: Medicare Other

## 2022-09-17 ENCOUNTER — Inpatient Hospital Stay: Payer: Medicare Other

## 2022-09-17 ENCOUNTER — Other Ambulatory Visit: Payer: Self-pay | Admitting: Hematology

## 2022-09-17 ENCOUNTER — Inpatient Hospital Stay: Payer: Medicare Other | Admitting: Hematology

## 2022-09-17 ENCOUNTER — Encounter (HOSPITAL_COMMUNITY): Payer: Self-pay | Admitting: Family Medicine

## 2022-09-17 DIAGNOSIS — M810 Age-related osteoporosis without current pathological fracture: Secondary | ICD-10-CM | POA: Diagnosis present

## 2022-09-17 DIAGNOSIS — D708 Other neutropenia: Secondary | ICD-10-CM | POA: Diagnosis not present

## 2022-09-17 DIAGNOSIS — J159 Unspecified bacterial pneumonia: Secondary | ICD-10-CM | POA: Diagnosis not present

## 2022-09-17 DIAGNOSIS — R296 Repeated falls: Secondary | ICD-10-CM | POA: Diagnosis present

## 2022-09-17 DIAGNOSIS — Z7401 Bed confinement status: Secondary | ICD-10-CM | POA: Diagnosis not present

## 2022-09-17 DIAGNOSIS — A0472 Enterocolitis due to Clostridium difficile, not specified as recurrent: Secondary | ICD-10-CM | POA: Diagnosis not present

## 2022-09-17 DIAGNOSIS — J189 Pneumonia, unspecified organism: Secondary | ICD-10-CM | POA: Diagnosis present

## 2022-09-17 DIAGNOSIS — K219 Gastro-esophageal reflux disease without esophagitis: Secondary | ICD-10-CM | POA: Diagnosis present

## 2022-09-17 DIAGNOSIS — M4856XD Collapsed vertebra, not elsewhere classified, lumbar region, subsequent encounter for fracture with routine healing: Secondary | ICD-10-CM | POA: Diagnosis not present

## 2022-09-17 DIAGNOSIS — Z79890 Hormone replacement therapy (postmenopausal): Secondary | ICD-10-CM | POA: Diagnosis not present

## 2022-09-17 DIAGNOSIS — D709 Neutropenia, unspecified: Secondary | ICD-10-CM | POA: Diagnosis not present

## 2022-09-17 DIAGNOSIS — R509 Fever, unspecified: Secondary | ICD-10-CM | POA: Diagnosis present

## 2022-09-17 DIAGNOSIS — Z515 Encounter for palliative care: Secondary | ICD-10-CM

## 2022-09-17 DIAGNOSIS — M549 Dorsalgia, unspecified: Secondary | ICD-10-CM | POA: Diagnosis not present

## 2022-09-17 DIAGNOSIS — Z7189 Other specified counseling: Secondary | ICD-10-CM

## 2022-09-17 DIAGNOSIS — N189 Chronic kidney disease, unspecified: Secondary | ICD-10-CM | POA: Diagnosis not present

## 2022-09-17 DIAGNOSIS — S32010D Wedge compression fracture of first lumbar vertebra, subsequent encounter for fracture with routine healing: Secondary | ICD-10-CM | POA: Diagnosis not present

## 2022-09-17 DIAGNOSIS — S22080A Wedge compression fracture of T11-T12 vertebra, initial encounter for closed fracture: Secondary | ICD-10-CM | POA: Diagnosis present

## 2022-09-17 DIAGNOSIS — M4855XA Collapsed vertebra, not elsewhere classified, thoracolumbar region, initial encounter for fracture: Secondary | ICD-10-CM | POA: Diagnosis present

## 2022-09-17 DIAGNOSIS — Z66 Do not resuscitate: Secondary | ICD-10-CM | POA: Diagnosis present

## 2022-09-17 DIAGNOSIS — M1990 Unspecified osteoarthritis, unspecified site: Secondary | ICD-10-CM | POA: Diagnosis present

## 2022-09-17 DIAGNOSIS — M4854XD Collapsed vertebra, not elsewhere classified, thoracic region, subsequent encounter for fracture with routine healing: Secondary | ICD-10-CM | POA: Diagnosis not present

## 2022-09-17 DIAGNOSIS — E039 Hypothyroidism, unspecified: Secondary | ICD-10-CM | POA: Diagnosis present

## 2022-09-17 DIAGNOSIS — N1832 Chronic kidney disease, stage 3b: Secondary | ICD-10-CM | POA: Diagnosis present

## 2022-09-17 DIAGNOSIS — W19XXXA Unspecified fall, initial encounter: Secondary | ICD-10-CM | POA: Diagnosis present

## 2022-09-17 DIAGNOSIS — Z7951 Long term (current) use of inhaled steroids: Secondary | ICD-10-CM | POA: Diagnosis not present

## 2022-09-17 DIAGNOSIS — S3992XA Unspecified injury of lower back, initial encounter: Secondary | ICD-10-CM | POA: Diagnosis not present

## 2022-09-17 DIAGNOSIS — Z800 Family history of malignant neoplasm of digestive organs: Secondary | ICD-10-CM | POA: Diagnosis not present

## 2022-09-17 DIAGNOSIS — A419 Sepsis, unspecified organism: Secondary | ICD-10-CM

## 2022-09-17 DIAGNOSIS — R627 Adult failure to thrive: Secondary | ICD-10-CM | POA: Diagnosis present

## 2022-09-17 DIAGNOSIS — D61818 Other pancytopenia: Secondary | ICD-10-CM | POA: Diagnosis present

## 2022-09-17 DIAGNOSIS — Z833 Family history of diabetes mellitus: Secondary | ICD-10-CM | POA: Diagnosis not present

## 2022-09-17 DIAGNOSIS — Z8249 Family history of ischemic heart disease and other diseases of the circulatory system: Secondary | ICD-10-CM | POA: Diagnosis not present

## 2022-09-17 DIAGNOSIS — Z79899 Other long term (current) drug therapy: Secondary | ICD-10-CM | POA: Diagnosis not present

## 2022-09-17 DIAGNOSIS — D469 Myelodysplastic syndrome, unspecified: Secondary | ICD-10-CM | POA: Diagnosis present

## 2022-09-17 DIAGNOSIS — S32010A Wedge compression fracture of first lumbar vertebra, initial encounter for closed fracture: Secondary | ICD-10-CM | POA: Insufficient documentation

## 2022-09-17 DIAGNOSIS — D696 Thrombocytopenia, unspecified: Secondary | ICD-10-CM | POA: Diagnosis not present

## 2022-09-17 DIAGNOSIS — Z681 Body mass index (BMI) 19 or less, adult: Secondary | ICD-10-CM | POA: Diagnosis not present

## 2022-09-17 DIAGNOSIS — E876 Hypokalemia: Secondary | ICD-10-CM | POA: Diagnosis not present

## 2022-09-17 DIAGNOSIS — E441 Mild protein-calorie malnutrition: Secondary | ICD-10-CM | POA: Diagnosis present

## 2022-09-17 DIAGNOSIS — Z1152 Encounter for screening for COVID-19: Secondary | ICD-10-CM | POA: Diagnosis not present

## 2022-09-17 DIAGNOSIS — F32A Depression, unspecified: Secondary | ICD-10-CM | POA: Diagnosis present

## 2022-09-17 LAB — CBC
HCT: 23.2 % — ABNORMAL LOW (ref 36.0–46.0)
Hemoglobin: 7.2 g/dL — ABNORMAL LOW (ref 12.0–15.0)
MCH: 31.2 pg (ref 26.0–34.0)
MCHC: 31 g/dL (ref 30.0–36.0)
MCV: 100.4 fL — ABNORMAL HIGH (ref 80.0–100.0)
Platelets: 17 10*3/uL — CL (ref 150–400)
RBC: 2.31 MIL/uL — ABNORMAL LOW (ref 3.87–5.11)
RDW: 19.1 % — ABNORMAL HIGH (ref 11.5–15.5)
WBC: 1.8 10*3/uL — ABNORMAL LOW (ref 4.0–10.5)
nRBC: 0 % (ref 0.0–0.2)

## 2022-09-17 LAB — URINALYSIS, W/ REFLEX TO CULTURE (INFECTION SUSPECTED)
Bilirubin Urine: NEGATIVE
Glucose, UA: NEGATIVE mg/dL
Ketones, ur: NEGATIVE mg/dL
Leukocytes,Ua: NEGATIVE
Nitrite: NEGATIVE
Protein, ur: 100 mg/dL — AB
Specific Gravity, Urine: 1.017 (ref 1.005–1.030)
pH: 5 (ref 5.0–8.0)

## 2022-09-17 LAB — BASIC METABOLIC PANEL
Anion gap: 8 (ref 5–15)
BUN: 39 mg/dL — ABNORMAL HIGH (ref 8–23)
CO2: 21 mmol/L — ABNORMAL LOW (ref 22–32)
Calcium: 7.8 mg/dL — ABNORMAL LOW (ref 8.9–10.3)
Chloride: 107 mmol/L (ref 98–111)
Creatinine, Ser: 1.59 mg/dL — ABNORMAL HIGH (ref 0.44–1.00)
GFR, Estimated: 30 mL/min — ABNORMAL LOW (ref >60)
Glucose, Bld: 104 mg/dL — ABNORMAL HIGH (ref 70–99)
Potassium: 3.3 mmol/L — ABNORMAL LOW (ref 3.5–5.1)
Sodium: 136 mmol/L (ref 135–145)

## 2022-09-17 LAB — STREP PNEUMONIAE URINARY ANTIGEN: Strep Pneumo Urinary Antigen: NEGATIVE

## 2022-09-17 MED ORDER — TBO-FILGRASTIM 300 MCG/0.5ML ~~LOC~~ SOSY
300.0000 ug | PREFILLED_SYRINGE | Freq: Every day | SUBCUTANEOUS | Status: AC
  Administered 2022-09-17 – 2022-09-19 (×3): 300 ug via SUBCUTANEOUS
  Filled 2022-09-17 (×3): qty 0.5

## 2022-09-17 MED ORDER — NYSTATIN 100000 UNIT/ML MT SUSP
5.0000 mL | Freq: Four times a day (QID) | OROMUCOSAL | Status: DC
  Administered 2022-09-17 – 2022-09-20 (×8): 500000 [IU] via ORAL
  Filled 2022-09-17 (×10): qty 5

## 2022-09-17 MED ORDER — POLYETHYLENE GLYCOL 3350 17 G PO PACK
17.0000 g | PACK | Freq: Every day | ORAL | Status: DC
  Administered 2022-09-20: 17 g via ORAL
  Filled 2022-09-17 (×3): qty 1

## 2022-09-17 MED ORDER — FLUTICASONE FUROATE-VILANTEROL 100-25 MCG/ACT IN AEPB
1.0000 | INHALATION_SPRAY | Freq: Every day | RESPIRATORY_TRACT | Status: DC
  Administered 2022-09-18 – 2022-09-20 (×3): 1 via RESPIRATORY_TRACT
  Filled 2022-09-17: qty 28

## 2022-09-17 MED ORDER — POTASSIUM CHLORIDE 20 MEQ PO PACK
40.0000 meq | PACK | Freq: Once | ORAL | Status: AC
  Administered 2022-09-17: 40 meq via ORAL
  Filled 2022-09-17: qty 2

## 2022-09-17 MED ORDER — ACETAMINOPHEN 325 MG PO TABS: 650.0000 mg | ORAL_TABLET | Freq: Four times a day (QID) | ORAL | Status: DC | PRN

## 2022-09-17 MED ORDER — OXYCODONE HCL 5 MG PO TABS
5.0000 mg | ORAL_TABLET | Freq: Four times a day (QID) | ORAL | Status: DC | PRN
  Administered 2022-09-18 – 2022-09-20 (×4): 5 mg via ORAL
  Filled 2022-09-17 (×4): qty 1

## 2022-09-17 MED ORDER — ACETAMINOPHEN 650 MG RE SUPP: 650.0000 mg | Freq: Four times a day (QID) | RECTAL | Status: DC | PRN

## 2022-09-17 MED ORDER — LEVOTHYROXINE SODIUM 25 MCG PO TABS
125.0000 ug | ORAL_TABLET | Freq: Every day | ORAL | Status: DC
  Administered 2022-09-18 – 2022-09-20 (×3): 125 ug via ORAL
  Filled 2022-09-17 (×3): qty 1

## 2022-09-17 MED ORDER — LEVOTHYROXINE SODIUM 100 MCG/5ML IV SOLN: 75.0000 ug | Freq: Every day | INTRAVENOUS | Status: DC

## 2022-09-17 MED ORDER — SODIUM CHLORIDE 0.9% IV SOLUTION: Freq: Once | INTRAVENOUS | Status: AC

## 2022-09-17 MED ORDER — PANTOPRAZOLE SODIUM 40 MG PO TBEC
40.0000 mg | DELAYED_RELEASE_TABLET | Freq: Every day | ORAL | Status: DC
  Administered 2022-09-17 – 2022-09-20 (×4): 40 mg via ORAL
  Filled 2022-09-17 (×4): qty 1

## 2022-09-17 MED ORDER — DARBEPOETIN ALFA 300 MCG/0.6ML IJ SOSY
300.0000 ug | PREFILLED_SYRINGE | Freq: Once | INTRAMUSCULAR | Status: AC
  Administered 2022-09-17: 300 ug via SUBCUTANEOUS
  Filled 2022-09-17: qty 0.6

## 2022-09-17 MED ORDER — SODIUM CHLORIDE 0.9 % IV SOLN
2.0000 g | INTRAVENOUS | Status: DC
  Administered 2022-09-17 – 2022-09-19 (×3): 2 g via INTRAVENOUS
  Filled 2022-09-17 (×4): qty 20

## 2022-09-17 MED ORDER — UMECLIDINIUM BROMIDE 62.5 MCG/ACT IN AEPB
1.0000 | INHALATION_SPRAY | Freq: Every day | RESPIRATORY_TRACT | Status: DC
  Administered 2022-09-18 – 2022-09-20 (×3): 1 via RESPIRATORY_TRACT
  Filled 2022-09-17: qty 7

## 2022-09-17 MED ORDER — SENNOSIDES-DOCUSATE SODIUM 8.6-50 MG PO TABS
1.0000 | ORAL_TABLET | Freq: Two times a day (BID) | ORAL | Status: DC
  Administered 2022-09-18: 1 via ORAL
  Filled 2022-09-17 (×6): qty 1

## 2022-09-17 MED ORDER — SODIUM CHLORIDE 0.9 % IV SOLN
500.0000 mg | INTRAVENOUS | Status: DC
  Administered 2022-09-17 – 2022-09-19 (×3): 500 mg via INTRAVENOUS
  Filled 2022-09-17 (×5): qty 5

## 2022-09-17 MED ORDER — MORPHINE SULFATE (PF) 2 MG/ML IV SOLN
2.0000 mg | INTRAVENOUS | Status: DC | PRN
  Administered 2022-09-17 (×2): 2 mg via INTRAVENOUS
  Filled 2022-09-17 (×3): qty 1

## 2022-09-17 MED ORDER — MORPHINE SULFATE (PF) 4 MG/ML IV SOLN: 4.0000 mg | INTRAVENOUS | Status: DC | PRN

## 2022-09-17 NOTE — Assessment & Plan Note (Signed)
Continue home medications when able.  IV Synthroid ordered for now.

## 2022-09-17 NOTE — Evaluation (Signed)
Clinical/Bedside Swallow Evaluation Patient Details  Name: Alison Bridges MRN: [id number] Date of Birth: 1928-09-01  Today's Date: 09/17/2022 Time: SLP Start Time (ACUTE ONLY): 1015 SLP Stop Time (ACUTE ONLY): 1100 SLP Time Calculation (min) (ACUTE ONLY): 45 min  Past Medical History:  Past Medical History:  Diagnosis Date   Anemia    Arthritis    C. difficile diarrhea    Cancer (HCC)    Chronic kidney disease    Depression    Dyspnea    Shortness of breath with low hemoglobin   GERD (gastroesophageal reflux disease)    Hypothyroidism    Myelodysplastic disease ([place])    Osteoporosis    Past Surgical History:  Past Surgical History:  Procedure Laterality Date   ABDOMINAL HYSTERECTOMY     APPENDECTOMY  1998   CATARACT EXTRACTION W/ INTRAOCULAR LENS IMPLANT Bilateral    CHOLECYSTECTOMY  1998   TRANSURETHRAL RESECTION OF BLADDER TUMOR N/A 05/08/2022   Procedure: TRANSURETHRAL RESECTION OF BLADDER TUMOR (TURBT);  Surgeon: [name], MD;  Location: [facility];  Service: Urology;  Laterality: N/A;  ONLY NEEDS 30 MIN   HPI:  89 yo from SNF, recent fall with left buttock pain - found to have T12 fx, bilateral pna. Pt PMH + for bladder cancer s/p TURPD Oct 2023, fall December 2023, achalasia *per pt* and s/p 2 dilatations - she purports last one was 50 years ago.  Pt also reports she does not desire repeat EGD.   Stomach/Bowel: 07/2022 Small hiatal hernia is seen. There is fluid in the lumen of thoracic esophagus suggesting possible gastroesophageal reflux. High density is seen in the lumen of the thoracic esophagus, stomach and duodenum, possibly oral contrast or oral medication.  Records dating back to 05/2019 (? when pt had COVID indicative pt likely had reflux and/or esophageal dysmotility.   Pt admits to weight loss of 10 pounds unitentionally over the last 3-4 months, which she attributes to lack of appetite. She admits to having issues coughing with food and/or drink -  resulting in strong coughing episode at least 2-3 times a week.  Denies recurrent pneumonias.    Assessment / Plan / Recommendation  Clinical Impression  Patient presents with clinical indications concerning for potential multi-factorial dysphagia. Her CN exam is unremarkable - ? oral candida on soft palate - pt on medication to treat it. Of note, baseline frequent throat clearing noted- ? d/t reflux.  Pt has a h/o achalasia *per her statement* - as well as suspected dysmotility, reflux on noted multiple prior Chest images- dating back to 2020.  Pt admits to chronicity of "coughing" with solid and liquid for "years" - stating she has approx 1-2 incidents weekly.  SLP observed pt consuming thin water, nectar thick cranberry juice, applesauce and graham crackers.  With oral care including dental brushing, rinsing and expectoration with water -  frequent throat clearing noted - but no indications of aspiration. Explosive cough noted immediately post first swallow of thin water via straw -concerning for overt infiltration of water into trachea due to swallow discoordination. Pt did not appear alarmed when this occured but admitted to pain with cough. Nectar thick liquids were clinically managed better - and pt concurs.  She currently has minimal dysathria - and this along with h /o esophageal dysmotility, recommend dys3 diet -with nectar liquids during meals.   Recommend she be allowed thin water between meals for hydration and comfort.  S  LP advised pt have MBS to assess pharyngeal swallow given  concerns for secondary pharyngeal deficits from esophageal issues- but pt politely declined. Educated her to increased potential pulmonary ramifications if she aspirates due to decreased mobiltiy, pain with cough causing her to not cough as frequently or strongly - Offered a pillow for discomfort with cough - but she declined.  She reported understanding to aformentioned information.-  ? If her dysphagia may be  contributing to her weight loss  without her explicit awareness given chronicity.    Messaged MD and obtained diet order. Will follow up for dysphagia management, pt agreeable with plan. SLP Visit Diagnosis: Dysphagia, pharyngeal phase (R13.13);Dysphagia, pharyngoesophageal phase (R13.14)    Aspiration Risk  Moderate aspiration risk    Diet Recommendation Dysphagia 3 (Mech soft);Nectar-thick liquid;Free water protocol after oral care  Small frequent meals Liquid Administration via: Cup;Straw Medication Administration: Whole meds with puree Supervision: Patient able to self feed Compensations: Slow rate;Small sips/bites Postural Changes: Seated upright at 90 degrees;Remain upright for at least 30 minutes after po intake    Other  Recommendations Oral Care Recommendations: Oral care BID    Recommendations for follow up therapy are one component of a multi-disciplinary discharge planning process, led by the attending physician.  Recommendations may be updated based on patient status, additional functional criteria and insurance authorization.  Follow up Recommendations Follow physician's recommendations for discharge plan and follow up therapies      Assistance Recommended at Discharge    Functional Status Assessment Patient has had a recent decline in their functional status and/or demonstrates limited ability to make significant improvements in function in a reasonable and predictable amount of time  Frequency and Duration min 1 x/week  1 week       Prognosis Prognosis for improved oropharyngeal function: Fair Barriers to Reach Goals: Time post onset      Swallow Study   General Date of Onset: 09/17/22 HPI: 89 yo from SNF, recent fall with left buttock pain - found to have T12 fx, bilateral pna. Pt PMH + for bladder cancer s/p TURPD Oct 2023, fall December 2023, achalasia *per pt* and s/p 2 dilatations - she purports last one was 50 years ago.  Pt also reports she does not desire  repeat EGD.   Stomach/Bowel: 07/2022 Small hiatal hernia is seen. There is fluid in the lumen of thoracic esophagus suggesting possible gastroesophageal reflux. High density is seen in the lumen of the thoracic esophagus, stomach and duodenum, possibly oral contrast or oral medication.  Records dating back to 05/2019 (? when pt had COVID indicative pt likely had reflux and/or esophageal dysmotility.   Pt admits to weight loss of 10 pounds unitentionally over the last 3-4 months, which she attributes to lack of appetite. She admits to having issues coughing with food and/or drink - resulting in strong coughing episode at least 2-3 times a week.  Denies recurrent pneumonias. Type of Study: Bedside Swallow Evaluation Previous Swallow Assessment: see HPI Diet Prior to this Study: Regular;Thin liquids (Level 0) Temperature Spikes Noted: No Respiratory Status: Nasal cannula History of Recent Intubation: No Behavior/Cognition: Alert;Cooperative;Pleasant mood Oral Cavity Assessment: Erythema Oral Care Completed by SLP: Yes Oral Cavity - Dentition: Adequate natural dentition;Other (Comment) (partial) Self-Feeding Abilities: Able to feed self Patient Positioning: Upright in bed Baseline Vocal Quality: Low vocal intensity Volitional Cough: Weak (pain limiting strength) Volitional Swallow: Able to elicit    Oral/Motor/Sensory Function Overall Oral Motor/Sensory Function: Within functional limits   Ice Chips Ice chips: Not tested   Thin Liquid Thin Liquid: Impaired Presentation:  Cup;Straw Pharyngeal  Phase Impairments: Throat Clearing - Immediate;Throat Clearing - Delayed;Cough - Immediate Other Comments: Immediate explosive coughing with first sip of water from straw- ? oral discoordination?    Nectar Thick Nectar Thick Liquid: Within functional limits Presentation: Self Fed;Straw Pharyngeal Phase Impairments: Throat Clearing - Immediate   Honey Thick Honey Thick Liquid: Not tested   Puree Puree:  Within functional limits Presentation: Self Fed   Solid     Solid: Within functional limits Presentation: Self Fed      Macario Golds 09/17/2022,11:51 AM  Kathleen Lime, MS [place] Office [phone]

## 2022-09-17 NOTE — Assessment & Plan Note (Signed)
Left hip x-ray was unremarkable however lumbar x-rays showed a new T12 and L1 lumbar fracture.  She has an ASO brace that was ordered by the ED provider.  -Morphine for pain control, transition to oral when able

## 2022-09-17 NOTE — Assessment & Plan Note (Addendum)
Stable. Follows up with Dr. Irene Limbo.  Baseline platelets ranges between 50-70,000 and admission Plt 22, consider transfusion  Baseline hemoglobin ranges around 8 and admission Hgb 7.3 , transfuse if needed  Baseline WBC is around 2 or less, admission at baseline  Monitor CBC.  Avoid anticoagulation.  Fall precautions  Follow up with heme-onc outpatient

## 2022-09-17 NOTE — H&P (Addendum)
History and Physical    Patient: Alison Bridges [id number] DOB: 1929-05-04 DOA: 09/16/2022 DOS: the patient was seen and examined on 09/17/2022 PCP: Burnard Bunting, MD  Patient coming from: ALF[facility]IL[facility])  Chief Complaint:  Chief Complaint  Patient presents with   Foot Pain    Right foot, fell last week   HPI: Alison Bridges is a 89 y.o. female with medical history significant of myelodysplastic disease (MDS with pancytopenia), chronic kidney disease 3B, depression, C. Difficle and hypothyroidism who presents for fever and cough.  Reports a fever as high as 101 and has been taking Tylenol for it.  She has been having a cough and nasal congestion with rhinorrhea.  He has productive cough with white sputum.  No history of smoking, asthma or COPD.  She feels somewhat short of breath the last couple of days.  Endorses some low back pain after a fall where she says her left foot came out from under her about a week ago and she landed on her left side.    In the ED, she was tachycardic and hypertensive satting 95% on room air.  She was afebrile but does note that she was taking Tylenol prior to arrival.  She is pancytopenic (MDS).  Lactic acid within normal limits.  COVID, influenza and RSV were negative.  She was started on ceftriaxone and azithromycin and given a 500 cc bolus of LR.  She was given 5 mg Norco for her back pain after finding new T12 and L1 compression fractures.   Review of Systems: As mentioned in the history of present illness. All other systems reviewed and are negative. Past Medical History:  Diagnosis Date   Anemia    Arthritis    C. difficile diarrhea    Cancer (HCC)    Chronic kidney disease    Depression    Dyspnea    Shortness of breath with low hemoglobin   GERD (gastroesophageal reflux disease)    Hypothyroidism    Myelodysplastic disease (Alison Bridges)    Osteoporosis    Past Surgical History:  Procedure Laterality Date   ABDOMINAL HYSTERECTOMY      APPENDECTOMY  1998   CATARACT EXTRACTION W/ INTRAOCULAR LENS IMPLANT Bilateral    CHOLECYSTECTOMY  1998   TRANSURETHRAL RESECTION OF BLADDER TUMOR N/A 05/08/2022   Procedure: TRANSURETHRAL RESECTION OF BLADDER TUMOR (TURBT);  Surgeon: [name], MD;  Location: [facility];  Service: Urology;  Laterality: N/A;  ONLY NEEDS 30 MIN   Social History:  reports that she has never smoked. She has never used smokeless tobacco. She reports that she does not drink alcohol and does not use drugs.  No Known Allergies  Family History  Problem Relation Age of Onset   Heart attack Mother 4   Pancreatic cancer Father    Parkinson's disease Sister    Diabetes Brother        Multiple complications   Heart disease Brother        Not sure what happened   Dementia Brother     Prior to Admission medications   Medication Sig Start Date End Date Taking? Authorizing Provider  Ascorbic Acid (VITAMIN C PO) Take 1 tablet by mouth daily.    [provider]  Azelastine HCl 137 MCG/SPRAY SOLN Place 1 spray into both nostrils daily as needed (allergies). 05/09/21   [provider]  b complex vitamins capsule Take 1 capsule by mouth daily.    [provider]  cholecalciferol (VITAMIN D)  1000 UNITS tablet Take 1,000 Units by mouth daily.    [provider]  Darbepoetin Alfa (ARANESP, ALBUMIN FREE,) 150 MCG/0.3ML SOSY injection Inject 150 mcg into the skin every 30 (thirty) days. 12-19-21 last dose given    [provider]  Dextromethorphan HBr (DELSYM PO) Take 10 mLs by mouth daily.    [provider]  guaiFENesin (MUCINEX) 600 MG 12 hr tablet Take 600 mg by mouth 2 (two) times daily.    [provider]  levothyroxine (SYNTHROID, LEVOTHROID) 125 MCG tablet Take 125 mcg by mouth daily before breakfast.    [provider]  nystatin (MYCOSTATIN) 100000 UNIT/ML suspension Take 5 mLs (500,000 Units total) by mouth 4 (four) times daily. Swish in  the mouth for 1-2 mins then swallow 07/17/22   [name], MD  OVER THE COUNTER MEDICATION Take 1 capsule by mouth daily. Blood Builder    [provider]  oxyCODONE (OXY IR/ROXICODONE) 5 MG immediate release tablet Take 1 tablet (5 mg total) by mouth every 6 (six) hours as needed for moderate pain or severe pain. 07/26/22   Amin, [name], MD  pantoprazole (PROTONIX) 40 MG tablet Take 40 mg by mouth daily as needed (acid reflux).    [provider]  senna-docusate (SENOKOT-S) 8.6-50 MG tablet Take 1 tablet by mouth 2 (two) times daily as needed for mild constipation or moderate constipation. 07/26/22   Amin, [name], MD  sertraline (ZOLOFT) 100 MG tablet Take 100 mg by mouth daily. 03/13/20   [provider]  TRELEGY ELLIPTA 100-62.5-25 MCG/ACT AEPB Take 1 puff by mouth daily. 07/05/22   [provider]    Physical Exam: Vitals:   09/16/22 2000 09/16/22 2217 09/17/22 0134 09/17/22 0136  BP: (!) 141/60 138/63  130/77  Pulse: 97 96  97  Resp: 18 18  16  $ Temp:  98.5 F (36.9 C)  98 F (36.7 C)  TempSrc:  Oral  Oral  SpO2: 97% 94% (!) 89% 96%  Weight:      Height:       GEN:     alert, pleasant elderly female and no distress    HENT:  mucus membranes dry, nares patent, no nasal discharge  EYES:   pupils equal and reactive, EOM intact RESP:  no increased work of breathing, right rales and diffuse rhonchi, no wheezing CVS:   regular rate and rhythm, distal pulses intact   ABD:  soft, non-tender; bowel sounds present; no palpable masses EXT:   no edema, baseline ROM, lumbar midline tenderness  NEURO: speech normal, alert, baseline orientation  Skin:   warm and dry,  Psych: Normal affect, appropriate speech and behavior   Data Reviewed:  Lumbar spine x-ray: 20 to 30% T12 compression fracture, 50% L1 compression loss Left hip x-ray: Unremarkable for fracture or dislocation Chest x-ray concerning for right midlung and left lower lung  multifocal pneumonia, small bilateral pleural effusions Lactic acid within normal limits WBC 1.9 Hemoglobin 7.3 Platelet 21 Scr  COVID, influenza, RSV negative  Assessment and Plan: * Multifocal pneumonia COVID, influenza AMB, RSV are all negative.  Stable on room air.  Chest x-ray concerning for multifocal pneumonia.   -Continue azithromycin and ceftriaxone IV.  -Placed in observation on MedSurg -Monitor respiratory status -Follow up legionella and strep pneumoniae urine antigen  Fall Left hip x-ray was unremarkable however lumbar x-rays showed a new T12 and L1 lumbar fracture.  She has an ASO brace that was ordered by the  ED provider.  -Morphine for pain control, transition to oral when able  Hypothyroidism Continue home medications when able.  IV Synthroid ordered for now.  Stage 3b chronic kidney disease (CKD) (HCC) Chronic. Baseline appears to be around 1.4. Admission 1.58.   Avoid nephrotoxic agents and hypotension  Renally dose medications Monitor serum Cr with BMP   MDS (myelodysplastic syndrome) (HCC) Stable. Monitor CBC.  Hemoglobin 7.3. Transfuse if needed.  Platelets lower than baseline. Transfuse if needed. Avoid anticoagulation.  Fall precautions  Follow up with heme-onc outpatient    Goals of care conversation. Patient prefers to be DNR/DNI understand the meaning of it.    Advance Care Planning:   Code Status: DNR   Consults: none  Family Communication: None  Severity of Illness: The appropriate patient status for this patient is OBSERVATION. Observation status is judged to be reasonable and necessary in order to provide the required intensity of service to ensure the patient's safety. The patient's presenting symptoms, physical exam findings, and initial radiographic and laboratory data in the context of their medical condition is felt to place them at decreased risk for further clinical deterioration. Furthermore, it is anticipated that the patient  will be medically stable for discharge from the hospital within 2 midnights of admission.   Author: Lyndee Hensen, DO 09/17/2022 4:02 AM  For on call review [email or web address].

## 2022-09-17 NOTE — Progress Notes (Signed)
HEMATOLOGY/ONCOLOGY INPATIENT PROGRESS NOTE  Date of Service: 09/17/2022  Inpatient Attending: .Shelly Coss, MD   SUBJECTIVE  Patient is well known at [facility]. She has been following up with [place] for continued evaluation and management of MDS/MPN with SRS F2 mutation who has been on supportive care with ESA's and transfusions as needed.  Patient has a history of chronic kidney disease stage III, depression, previous history of C. difficile, hypothyroidism And unfortunately has had worsening performance status over the last few months.  She was admitted with a fall and left gluteal hematoma in December 2023 during PRBC transfusions as well as from a fall that led to her left wrist fracture left distal fibular fracture.  She subsequently had another fall and was in the emergency room on 09/10/2022 with pain and swelling of the right ankle thought to be related to trauma. Admitted high-grade fevers of 101 with cough nasal congestion and a productive cough.  COVID-19 influenza and RSV testing was negative.  Chest x-ray showed multifocal pneumonia.  She was also noted to have back pain with imaging showing new T12 and L1 compression fractures.  Patient has been started antibiotics with ceftriaxone and azithromycin and I was consulted to weigh in on her pancytopenia. Her hemoglobin levels are down to 7.2 and her thrombocytopenia has worsened and platelets are down to 17k.  She continues to be neutropenic. Goals of care discussion are ongoing and palliative care has been consulted in light of her multiple falls failure to thrive and anemia in the setting of MDS. I discussed in detail with the patient goals of care.  She understands that she has been doing poorly does feel little foggy.  We specifically discussed whether she would want any interventions for her symptomatic anemia continue her iron as per chart and consider G-CSF to see if it helps her neutropenia to help her clear her  current bout of pneumonia.  She notes that she will pursue these interventions at this time. Palliative care has also been in touch with her son Rush Landmark and is having a Microbiologist.  Patient understands that she has been in pain and has been doing poorly and is open to the idea of hospice.  She wanted to know my opinion and I told her that in the context of her overall condition even if her pneumonia was controlled her MDS is incurable and she is having progressive decline in her performance status and likely will need increasing transfusion support for her MDS as well.  In this context it would be very reasonable to consider best supportive cares through hospice as opposed to being subjected to an increased burden of medical care.  OBJECTIVE:  NAD  PHYSICAL EXAMINATION: . Vitals:   09/17/22 0134 09/17/22 0136 09/17/22 0518 09/17/22 1013  BP:  130/77 (!) 143/57 (!) 127/58  Pulse:  97 (!) 106 (!) 106  Resp:  16 18 18  $ Temp:  98 F (36.7 C) (!) 97.4 F (36.3 C) 98.3 F (36.8 C)  TempSrc:  Oral Oral Oral  SpO2: (!) 89% 96% 97% 97%  Weight:   54.7 kg   Height:       Filed Weights   09/16/22 1713 09/17/22 0518  Weight: 51.7 kg 54.7 kg   .Body mass index is 18.34 kg/m.  GENERAL:alert, in no acute distress, mild distress from pain in the back. OROPHARYNX:no exudate, no erythema and Bridges, buccal mucosa, and tongue normal  NECK: supple, no JVD, thyroid normal size,  non-tender, without nodularity LYMPH:  no palpable lymphadenopathy in the cervical, axillary or inguinal LUNGS: clear to auscultation with normal respiratory effort HEART: regular rate & rhythm, ABDOMEN: Some tenderness to palpation over epigastric area PSYCH: alert & oriented x 2, able to recognize me as her hematologist NEURO: no focal motor/sensory deficits  MEDICAL HISTORY:  Past Medical History:  Diagnosis Date   Anemia    Arthritis    C. difficile diarrhea    Cancer ([place])    Chronic kidney disease     Depression    Dyspnea    Shortness of breath with low hemoglobin   GERD (gastroesophageal reflux disease)    Hypothyroidism    Myelodysplastic disease ([place])    Osteoporosis     SURGICAL HISTORY: Past Surgical History:  Procedure Laterality Date   ABDOMINAL HYSTERECTOMY     APPENDECTOMY  1998   CATARACT EXTRACTION W/ INTRAOCULAR LENS IMPLANT Bilateral    CHOLECYSTECTOMY  1998   TRANSURETHRAL RESECTION OF BLADDER TUMOR N/A 05/08/2022   Procedure: TRANSURETHRAL RESECTION OF BLADDER TUMOR (TURBT);  Surgeon: [name], MD;  Location: [facility];  Service: Urology;  Laterality: N/A;  ONLY NEEDS 30 MIN    SOCIAL HISTORY: Social History   Socioeconomic History   Marital status: Widowed    Spouse name: Not on file   Number of children: Not on file   Years of education: Not on file   Highest education level: Not on file  Occupational History   Occupation: Oncologist    Comment: Retired  Tobacco Use   Smoking status: Never   Smokeless tobacco: Never  Vaping Use   Vaping Use: Never used  Substance and Sexual Activity   Alcohol use: No   Drug use: No   Sexual activity: Not on file  Other Topics Concern   Not on file  Social History Narrative   She is tried to stay active --  2 days a week she does senior aerobics.  She also enjoys going for walks, but no standard routine.      She has 3 children and 2 grandchildren.   Social Determinants of Health   Financial Resource Strain: Not on file  Food Insecurity: No Food Insecurity (09/17/2022)   Hunger Vital Sign    Worried About Running Out of Food in the Last Year: Never true    Ran Out of Food in the Last Year: Never true  Transportation Needs: No Transportation Needs (09/17/2022)   PRAPARE - Hydrologist (Medical): No    Lack of Transportation (Non-Medical): No  Physical Activity: Not on file  Stress: Not on file  Social Connections: Not on file  Intimate Partner Violence:  Not At Risk (09/17/2022)   Humiliation, Afraid, Rape, and Kick questionnaire    Fear of Current or Ex-Partner: No    Emotionally Abused: No    Physically Abused: No    Sexually Abused: No    FAMILY HISTORY: Family History  Problem Relation Age of Onset   Heart attack Mother 67   Pancreatic cancer Father    Parkinson's disease Sister    Diabetes Brother        Multiple complications   Heart disease Brother        Not sure what happened   Dementia Brother     ALLERGIES:  has No Known Allergies.  MEDICATIONS:  Scheduled Meds:  sodium chloride   Intravenous Once   fluticasone furoate-vilanterol  1 puff Inhalation Daily  And   umeclidinium bromide  1 puff Inhalation Daily   [START ON 09/18/2022] levothyroxine  125 mcg Oral Q0600   nystatin  5 mL Oral QID   pantoprazole  40 mg Oral Daily   polyethylene glycol  17 g Oral Daily   potassium chloride  40 mEq Oral Once   senna-docusate  1 tablet Oral BID   Continuous Infusions:  azithromycin     cefTRIAXone (ROCEPHIN)  IV     PRN Meds:.acetaminophen **OR** acetaminophen, morphine injection, oxyCODONE  REVIEW OF SYSTEMS:    10 Point review of Systems was done is negative except as noted above.   LABORATORY DATA:  I have reviewed the data as listed  .    Latest Ref Rng & Units 09/17/2022    6:10 AM 09/16/2022    6:40 PM 09/12/2022   11:24 AM  CBC  WBC 4.0 - 10.5 K/uL 1.8  1.9  1.6   Hemoglobin 12.0 - 15.0 g/dL 7.2  7.3  6.6   Hematocrit 36.0 - 46.0 % 23.2  22.5  20.2   Platelets 150 - 400 K/uL 17  21  37    ANC of 700  .    Latest Ref Rng & Units 09/17/2022    6:10 AM 09/16/2022    6:40 PM 07/26/2022    5:37 AM  CMP  Glucose 70 - 99 mg/dL 104  109  105   BUN 8 - 23 mg/dL 39  39  23   Creatinine 0.44 - 1.00 mg/dL 1.59  1.58  1.21   Sodium 135 - 145 mmol/L 136  132  133   Potassium 3.5 - 5.1 mmol/L 3.3  3.8  4.0   Chloride 98 - 111 mmol/L 107  106  106   CO2 22 - 32 mmol/L 21  19  20   $ Calcium 8.9 - 10.3 mg/dL  7.8  7.9  7.8   Total Protein 6.5 - 8.1 g/dL  6.9    Total Bilirubin 0.3 - 1.2 mg/dL  1.2    Alkaline Phos 38 - 126 U/L  84    AST 15 - 41 U/L  20    ALT 0 - 44 U/L  17       RADIOGRAPHIC STUDIES: I have personally reviewed the radiological images as listed and agreed with the findings in the report. DG Lumbar Spine Complete  Result Date: 09/16/2022 CLINICAL DATA:  Fall, back pain EXAM: LUMBAR SPINE - COMPLETE 4+ VIEW COMPARISON:  CT 07/21/2022 FINDINGS: There are compression deformities of T12 with 20-30% loss of height and anteriorly at L1 with 50% loss of height. Both fractures appear new since prior CT examination and fracture in T12 is acute to subacute in nature with widening of the paraspinal soft tissues likely related to surrounding edema or hemorrhage. No retropulsion. The posterior wall of the vertebral bodies appear intact on this limited examination. Remaining vertebral body height is preserved. Stable grade 2 anterolisthesis L5-S1. Intervertebral disc space narrowing and endplate remodeling of [id number] again noted in keeping with changes of moderate to severe degenerative disc disease. Facet arthrosis at L5-S1 is not well profiled on this examination. Stable levoscoliosis of the lumbar spine, apex left at L5. Interval development of a mild dextrocurvature of the thoracolumbar spine, apex right at L1. IMPRESSION: 1. Suspected acute to subacute compression fracture of T12 with 50% loss of height. No retropulsion. Age indeterminate fracture L1 with 20-30% loss of height. Acuity could be confirmed with MRI examination if  indicated. 2. Stable grade 2 anterolisthesis L5-S1. 3. Stable moderate to severe degenerative disc disease L3-S1. Electronically Signed   By: Fidela Salisbury M.D.   On: 09/16/2022 19:34   DG Chest 2 View  Result Date: 09/16/2022 CLINICAL DATA:  Cough EXAM: CHEST - 2 VIEW COMPARISON:  Chest x-ray 01/13/2022.  Chest CT 07/21/2022. FINDINGS: There are faint multifocal patchy  airspace opacities in the right mid lung and left lower lung. Small bilateral pleural effusions. Aorta is ectatic. Cardiac silhouette is within normal limits. Hiatal hernia present. No acute fractures. IMPRESSION: 1. Faint multifocal patchy airspace opacities in the right mid lung and left lower lung concerning for multifocal pneumonia. 2. Small bilateral pleural effusions. Electronically Signed   By: Ronney Asters M.D.   On: 09/16/2022 19:29   DG Hip Unilat W or Wo Pelvis 2-3 Views Left  Result Date: 09/16/2022 CLINICAL DATA:  Fall EXAM: DG HIP (WITH OR WITHOUT PELVIS) 2-3V LEFT COMPARISON:  None Available. FINDINGS: Normal alignment. No acute fracture or dislocation. Degenerative changes are seen at the lumbosacral junction and hips bilaterally, right greater than left. Soft tissues are unremarkable. IMPRESSION: 1. Degenerative change. No acute fracture or dislocation. Electronically Signed   By: Fidela Salisbury M.D.   On: 09/16/2022 19:28   DG Ankle Complete Right  Result Date: 09/10/2022 CLINICAL DATA:  Right ankle pain.  Possible injury yesterday. EXAM: RIGHT ANKLE - COMPLETE 3+ VIEW COMPARISON:  Right ankle radiographs 12/08/2019 FINDINGS: There is diffuse decreased bone mineralization. The ankle mortise is symmetric and intact. Tiny plantar calcaneal heel spur. Possible mild lateral malleolar soft tissue swelling, similar to prior. No acute fracture is seen. No dislocation. IMPRESSION: Possible mild lateral malleolar soft tissue swelling. No acute fracture. Electronically Signed   By: Yvonne Kendall M.D.   On: 09/10/2022 17:37    ASSESSMENT & PLAN:   Alison Bridges is a 89 y.o. female with:   Multifocal provide acquired pneumonia in the context of significant neutropenia. Severe thrombocytopenia worsened from baseline due to infection and antibiotics. Neutropenia chronic from her MDS Symptomatic anemia with hemoglobin of 7.2.  Has been on Aranesp 300 mcg 4 weeks as outpatient Pancytopenia -  likely from MDS +/- MPN -01/10/2020 [facility] Surgical Pathology Report ([id number]) revealed "BONE MARROW, ASPIRATE, CLOT, CORE: -Hypercellular marrow with myeloid hyperplasia and megakaryocytic atypia  PERIPHERAL BLOOD: -  Pancytopenia". -12/16/19 JAK2 (including V617 and Exon 12), MPL, and CALR-Next Generation Sequencing shows "Tier II: Variants of Potential Clinical Significance - SRSF2 p.Pro95His" 6. Hepatosplenomegaly  -Likely from her MDS/MPN CT scan in December 2023.   7.history of noninvasive low-grade papillary urothelial carcinoma: --Underwent TURBT on 05/08/2022. Under the care of urologist, Dr. Lovena Neighbours.   8.  History of C.Diff diarrhea:  9.  Recurrent falls with failure to thrive and radiation to her left wrist left ankle and compression fractures in her spine.  10.  Failure to thrive with worsening performance status PLAN: -Reviewed the patient's inpatient medical records in detail and her labs and other imaging studies and discussed this with the patient. -I discussed in detail her goals of care with regards to interventions for her cytopenias as well as in the broader picture of her failure to thrive recurrent falls and increasing healthcare needs. -Patient reports that she is open to considering an approach to being Comfortable with best supportive cares through hospice on discharge. -She prefers to continue her antibiotics for her multifocal pneumonia and to consider G-CSF in a time-limited fashion to see if  it might help her neutropenia temporarily and help her with her current pneumonia.  She understands this would not be a good long-term proposition. -She would also like to get her monthly Aranesp shot which is due presently. -She has received 2 units of platelets today -I also ordered 1 unit of PRBC for symptomatic anemia. -Appreciate palliative care input.  From hematology standpoint the patient would be reasonable to be considered for best supportive care through hospice  considering her worsening performance status pancytopenia from MDS which may be getting transfusion dependent and tendencies towards significant infections. -If patient and her family final decision regarding transition to hospice reasonable to discontinue her G-CSF. -Hematology will be available if any other questions arise -Appreciate excellent hospital medicine cares  The total time spent in the appointment was 60 minutes*.  All of the patient's questions were answered with apparent satisfaction. The patient knows to call the clinic with any problems, questions or concerns.   Sullivan Lone MD MS AAHIVMS [facility] Hematology/Oncology Physician [facility]  .*Total Encounter Time as defined by the Centers for Medicare and Medicaid Services includes, in addition to the face-to-face time of a patient visit (documented in the note above) non-face-to-face time: obtaining and reviewing outside history, ordering and reviewing medications, tests or procedures, care coordination (communications with other health care professionals or caregivers) and documentation in the medical record.   Sullivan Lone MD MS AAHIVMS [facility] Hematology/Oncology Physician [facility]  (Office):       [phone] (Work cell):  [phone] (Fax):           [phone]  09/17/2022 11:42 AM    I, Cleda Mccreedy, am acting as a Education administrator for Sullivan Lone, MD. .I have reviewed the above documentation for accuracy and completeness, and I agree with the above. Sullivan Lone MD MS

## 2022-09-17 NOTE — Assessment & Plan Note (Addendum)
Chronic. Baseline appears to be around 1.4. Admission 1.58.   Avoid nephrotoxic agents and hypotension  Renally dose medications Monitor serum Cr with BMP

## 2022-09-17 NOTE — Evaluation (Signed)
Physical Therapy Evaluation Patient Details Name: MACKENZEE GOULD MRN: [id number] DOB: 1928/11/20 Today's Date: 09/17/2022  History of Present Illness  Pt is a 89 y.o. female presenting to [facility] from [place] with low grade fever and a cough, sore throat, wheezing and shortness of breath for a few days. She has also been dealing with some left buttock/low back pain after a fall last week that resulted in a right ankle sprain. PMH significant for bladder cancer, osteoporosis, hypothyroidism, CKD, anemia, OA, depression, GERD, MD.   Clinical Impression  ASHAYA BAGBY is 89 y.o. female admitted with above HPI and diagnosis. Patient is currently limited by functional impairments below (see PT problem list). Patient has been resident at [facility] and is has assist for ADL's from staff and for mobility to transfer with RW. She has had multiple falls resulting in injuries in the last 3 months and has a ankle brace for a Rt ankle sprain, Lt wrist splint for radial fracture. Pt also now has a TLSO in room, poorly fitting and donned in supine, MD at bedside and discussed plan to adjust to lumbar corset for comfort instead, MD agreed. PT more comfortable with lumbar corset and able to transfer with min+2 assist from bed>chair with min increase in pain. Patient will benefit from continued skilled PT interventions to address impairments and progress independence with mobility, recommending SNF rehab with 24/7 assist/care. Acute PT will follow and progress as able.        Recommendations for follow up therapy are one component of a multi-disciplinary discharge planning process, led by the attending physician.  Recommendations may be updated based on patient status, additional functional criteria and insurance authorization.  Follow Up Recommendations Skilled nursing-short term rehab (<3 hours/day) Can patient physically be transported by private vehicle: No    Assistance Recommended at Discharge Frequent  or constant Supervision/Assistance  Patient can return home with the following  A lot of help with walking and/or transfers;A lot of help with bathing/dressing/bathroom;Assistance with cooking/housework;Direct supervision/assist for medications management;Assist for transportation;Help with stairs or ramp for entrance    Equipment Recommendations None recommended by PT  Recommendations for Other Services       Functional Status Assessment Patient has had a recent decline in their functional status and demonstrates the ability to make significant improvements in function in a reasonable and predictable amount of time.     Precautions / Restrictions Precautions Precautions: Fall Precaution Booklet Issued: No Precaution Comments: back preacautions Required Braces or Orthoses: Spinal Brace Spinal Brace: Thoracolumbosacral orthotic;Lumbar corset;Other (comment) Spinal Brace Comments: TLSO turned into Lumbar corset due to height/length of brace limiting pt; brace ordered for comfort Restrictions Weight Bearing Restrictions: No      Mobility  Bed Mobility Overal bed mobility: Needs Assistance Bed Mobility: Supine to Sit     Supine to sit: Min assist, HOB elevated          Transfers Overall transfer level: Needs assistance Equipment used: 2 person hand held assist Transfers: Sit to/from Stand, Bed to chair/wheelchair/BSC Sit to Stand: Min assist, +2 safety/equipment, +2 physical assistance   Step pivot transfers: Min assist, +2 safety/equipment            Ambulation/Gait                  Stairs            Wheelchair Mobility    Modified Rankin (Stroke Patients Only)       Balance Overall  balance assessment: Needs assistance Sitting-balance support: Feet supported, Bilateral upper extremity supported Sitting balance-Leahy Scale: Fair     Standing balance support: During functional activity, Bilateral upper extremity supported Standing  balance-Leahy Scale: Poor                               Pertinent Vitals/Pain Pain Assessment Pain Assessment: Faces Faces Pain Scale: Hurts little more Pain Location: general back pain with mobility Pain Descriptors / Indicators: Aching, Discomfort Pain Intervention(s): Limited activity within patient's tolerance, Monitored during session, Repositioned    Home Living Family/patient expects to be discharged to:: Assisted living                 Home Equipment: Cane - quad;Grab bars - tub/shower Additional Comments: From [facility]    Prior Function Prior Level of Function : Needs assist  Cognitive Assist : Mobility (cognitive);ADLs (cognitive)     Physical Assist : Mobility (physical);ADLs (physical) Mobility (physical): Transfers;Gait;Bed mobility ADLs (physical): Bathing;Dressing;Toileting;Grooming;IADLs Mobility Comments: prio to fall in December pt was independent with mobility. has declined gradually since then and needs increased assist from staff at [facility]. ADLs Comments: patient reported having assistance for LB dressing tasks and shower. patient reportd she helped a little with LB dressing tasks.     Hand Dominance   Dominant Hand: Right    Extremity/Trunk Assessment   Upper Extremity Assessment Upper Extremity Assessment: LUE deficits/detail;RUE deficits/detail RUE Deficits / Details: patietn was noted to have large purple bruise covering scapular area on back. patient able to ABduct arm about 30 degrees and FF about 20 degrees with notable grimancing. patient declined to participate in PROM or AAROM at this time with pain RUE: Unable to fully assess due to pain LUE Deficits / Details: patient has a wrist brace on this forearm/hand. patient has a history of radial fx. patient able to AROM shoulder about 30 degrees FF. unclear if pain was in shoulders or back with movements. patient was not forthcoming with descriptions. LUE:  Unable to fully assess due to pain    Lower Extremity Assessment Lower Extremity Assessment: Generalized weakness;    Cervical / Trunk Assessment Cervical / Trunk Assessment: Kyphotic  Communication   Communication: No difficulties  Cognition Arousal/Alertness: Awake/alert Behavior During Therapy: WFL for tasks assessed/performed Overall Cognitive Status: No family/caregiver present to determine baseline cognitive functioning                                          General Comments      Exercises     Assessment/Plan    PT Assessment Patient needs continued PT services  PT Problem List Decreased strength;Decreased activity tolerance;Decreased balance;Decreased mobility;Decreased cognition;Decreased knowledge of use of DME;Decreased safety awareness;Decreased knowledge of precautions;Pain;Decreased skin integrity       PT Treatment Interventions DME instruction;Balance training;Therapeutic exercise;Therapeutic activities;Functional mobility training;Stair training;Gait training;Neuromuscular re-education;Cognitive remediation;Patient/family education    PT Goals (Current goals can be found in the Care Plan section)  Acute Rehab PT Goals Patient Stated Goal: regain strength PT Goal Formulation: With patient Time For Goal Achievement: 10/01/22 Potential to Achieve Goals: Fair    Frequency Min 3X/week     Co-evaluation               AM-PAC PT "6 Clicks" Mobility  Outcome Measure Help needed turning from  your back to your side while in a flat bed without using bedrails?: A Little Help needed moving from lying on your back to sitting on the side of a flat bed without using bedrails?: A Lot Help needed moving to and from a bed to a chair (including a wheelchair)?: A Lot Help needed standing up from a chair using your arms (e.g., wheelchair or bedside chair)?: A Lot Help needed to walk in hospital room?: A Lot Help needed climbing 3-5 steps with a  railing? : Total 6 Click Score: 12    End of Session Equipment Utilized During Treatment: Gait belt Activity Tolerance: Patient tolerated treatment well Patient left: with call bell/phone within reach;with bed alarm set;in bed;with family/visitor present Nurse Communication: Mobility status PT Visit Diagnosis: Unsteadiness on feet (R26.81);Muscle weakness (generalized) (M62.81);Difficulty in walking, not elsewhere classified (R26.2)    Time: [id number] PT Time Calculation (min) (ACUTE ONLY): 20 min   Charges:   PT Evaluation $PT Eval Moderate Complexity: 1 Mod          Verner Mould, DPT Acute Rehabilitation Services Office [phone]  09/17/22 12:31 PM

## 2022-09-17 NOTE — Progress Notes (Addendum)
PROGRESS NOTE  Alison Bridges  [id number] DOB: February 14, 1929 DOA: 09/16/2022 PCP: Burnard Bunting, MD   Brief Narrative: Patient is a 89 year old female with history of myelodysplastic syndrome, CKD stage IIIb, depression,hypothyroidism who presented from ALF with complaint of fever, cough, right foot pain.  She reported fever up to 101F. Cough was productive with whitish sputum.  Patient had a fall about 2 weeks ago  and developed low back pain.   She was admitted here in December and was discharged on 12/22 after management for left wrist laceration/fracture, distal fibular fracture, left gluteal hematoma.  Orthopedics recommended splint placement of the left wrist and outpatient follow-up.  On presentation, she was tachycardic, hypertensive but saturating fine on room air.  Afebrile.  Lab work showed pancytopenia.  Chest x-ray showed multifocal pneumonia.  Started on broad-spectrum antibiotics to cover for community-acquired pneumonia.  Hematology/oncology also consulted for severe pancytopenia.  Assessment & Plan:  Principal Problem:   Multifocal pneumonia Active Problems:   MDS (myelodysplastic syndrome) (HCC)   Stage 3b chronic kidney disease (CKD) (HCC)   T12 compression fracture, initial encounter ([place])   Hypothyroidism   Fall   Compression fracture of L1 lumbar vertebra ([place])   Multifocal pneumonia: Presented with fever, cough.  She is not hypoxic on presentation but she is on 2 L of oxygen per minute now.  Lungs are almost clear on auscultation.  Chest x-ray showed multifocal patchy airspace opacities in the right mid lung and left lower lung concerning for multifocal pneumonia.  Started on ceftriaxone, azithromycin.  Follow-up streptococcal pneumoniae, Legionella antigen.  Pancytopenia/myelodysplastic syndrome: Severe pancytopenia.  Platelet of 17, hemoglobin of 7.2, ANC of 630.  We consulted hematology.She follows with Dr. Irene Limbo who will see the patient.  Patient has severe  thrombocytopenia, will order 2 units of platelet transfusion today  Recent history of fall/compression fracture: History of multiple falls.  Last fall about 2 weeks ago.  Lumbar x-ray showed suspected acute to subacute compression fracture of T12 with 50% loss of height. No retropulsion.  Also showed age indeterminate fracture L1 with 20-30% loss of height.  PT/OT consulted.  Patient does not complain of severe back pain so most likely will manage this compression fracture conservatively.  Continue TLSO brace while out of bed. she is  also not a candidate for kyphoplasty due to her severe thrombocytopenia She was admitted here in December and was discharged on 12/22 after management for left wrist laceration/fracture, distal fibular fracture, left gluteal hematoma.  Orthopedics recommended splint placement of the left wrist and outpatient follow-up.  She was recommended weightbearing as tolerated for left distal fibular fracture.  Right foot pain: Recent history of fall.  X-ray of right ankle showed mild lateral malleolar soft tissue swelling. No acute fracture.  Continue supportive care, pain management  Hypothyroidism: Continue Synthyroid  CKD stage IIIb: Baseline creatinine around  1.4.  Currently kidney function close to  baseline.  Avoid nephrotoxic agents.  Monitor kidney function  Hypokalemia: Supplemented with potassium  Goals of care: Elderly patient with multiple comorbidities including MDS.  CODE STATUS DNR.  Will consult palliative care for further goals of care       DVT prophylaxis:SCDs Start: 09/17/22 1000     Code Status: DNR  Family Communication: Long discussion held with the daughter and son on phone on 2/13  Patient status:Obs  Patient is from :ALF/ILF  Anticipated discharge to:SNF?  Estimated DC date:not sure   Consultants: Hematology/oncology  Procedures: None  Antimicrobials:  Anti-infectives (From  admission, onward)    Start     Dose/Rate Route  Frequency Ordered Stop   09/17/22 2000  cefTRIAXone (ROCEPHIN) 2 g in sodium chloride 0.9 % 100 mL IVPB        2 g 200 mL/hr over 30 Minutes Intravenous Every 24 hours 09/17/22 0105 09/21/22 1959   09/17/22 2000  azithromycin (ZITHROMAX) 500 mg in sodium chloride 0.9 % 250 mL IVPB        500 mg 250 mL/hr over 60 Minutes Intravenous Every 24 hours 09/17/22 0105 09/21/22 1959   09/16/22 2000  cefTRIAXone (ROCEPHIN) 1 g in sodium chloride 0.9 % 100 mL IVPB        1 g 200 mL/hr over 30 Minutes Intravenous  Once 09/16/22 1946 09/16/22 2109   09/16/22 2000  azithromycin (ZITHROMAX) 500 mg in sodium chloride 0.9 % 250 mL IVPB        500 mg 250 mL/hr over 60 Minutes Intravenous  Once 09/16/22 1946 09/16/22 2136       Subjective: Patient seen and examined at bedside today.  Looks overall comfortable.  She was trying to eat her breakfast.  She was on 2 L of oxygen per minute.  Complains of some shortness of breath but not in respiratory  distress.  Not coughing.  Denies any significant back pain.  She says she has back pain when she moves.  Remains mostly alert and oriented.  Appears very deconditioned  Objective: Vitals:   09/16/22 2217 09/17/22 0134 09/17/22 0136 09/17/22 0518  BP: 138/63  130/77 (!) 143/57  Pulse: 96  97 (!) 106  Resp: 18  16 18  $ Temp: 98.5 F (36.9 C)  98 F (36.7 C) (!) 97.4 F (36.3 C)  TempSrc: Oral  Oral Oral  SpO2: 94% (!) 89% 96% 97%  Weight:    54.7 kg  Height:        Intake/Output Summary (Last 24 hours) at 09/17/2022 0747 Last data filed at 09/17/2022 0700 Gross per 24 hour  Intake 850.11 ml  Output 300 ml  Net 550.11 ml   Filed Weights   09/16/22 1713 09/17/22 0518  Weight: 51.7 kg 54.7 kg    Examination:  General exam: Overall comfortable, not in distress, pleasant elderly female, very deconditioned, weak HEENT: PERRL Respiratory system: Mild diminished air sounds on bases, no wheezes or crackles  Cardiovascular system: Sinus  tachycardia Gastrointestinal system: Abdomen is nondistended, soft and nontender. Central nervous system: Alert and oriented Extremities: No edema, no clubbing ,no cyanosis, splint on left wrist, immobilizer on left foot Skin: No rashes, no ulcers,no icterus     Data Reviewed: I have personally reviewed following labs and imaging studies  CBC: Recent Labs  Lab 09/12/22 1124 09/16/22 1840 09/17/22 0610  WBC 1.6* 1.9* 1.8*  NEUTROABS  --  0.7*  --   HGB 6.6* 7.3* 7.2*  HCT 20.2* 22.5* 23.2*  MCV 100.0 99.1 100.4*  PLT 37* 21* 17*   Basic Metabolic Panel: Recent Labs  Lab 09/16/22 1840 09/17/22 0610  NA 132* 136  K 3.8 3.3*  CL 106 107  CO2 19* 21*  GLUCOSE 109* 104*  BUN 39* 39*  CREATININE 1.58* 1.59*  CALCIUM 7.9* 7.8*     Recent Results (from the past 240 hour(s))  Resp panel by RT-PCR (RSV, Flu A&B, Covid) Anterior Nasal Swab     Status: None   Collection Time: 09/16/22  7:05 PM   Specimen: Anterior Nasal Swab  Result Value Ref Range Status  SARS Coronavirus 2 by RT PCR NEGATIVE NEGATIVE Final    Comment: (NOTE) SARS-CoV-2 target nucleic acids are NOT DETECTED.  The SARS-CoV-2 RNA is generally detectable in upper respiratory specimens during the acute phase of infection. The lowest concentration of SARS-CoV-2 viral copies this assay can detect is 138 copies/mL. A negative result does not preclude SARS-Cov-2 infection and should not be used as the sole basis for treatment or other patient management decisions. A negative result may occur with  improper specimen collection/handling, submission of specimen other than nasopharyngeal swab, presence of viral mutation(s) within the areas targeted by this assay, and inadequate number of viral copies(<138 copies/mL). A negative result must be combined with clinical observations, patient history, and epidemiological information. The expected result is Negative.  Fact Sheet for Patients:   EntrepreneurPulse.com.au  Fact Sheet for Healthcare Providers:  IncredibleEmployment.be  This test is no t yet approved or cleared by the [place] FDA and  has been authorized for detection and/or diagnosis of SARS-CoV-2 by FDA under an Emergency Use Authorization (EUA). This EUA will remain  in effect (meaning this test can be used) for the duration of the COVID-19 declaration under Section 564(b)(1) of the Act, 21 U.S.C.section 360bbb-3(b)(1), unless the authorization is terminated  or revoked sooner.       Influenza A by PCR NEGATIVE NEGATIVE Final   Influenza B by PCR NEGATIVE NEGATIVE Final    Comment: (NOTE) The Xpert Xpress SARS-CoV-2/FLU/RSV plus assay is intended as an aid in the diagnosis of influenza from Nasopharyngeal swab specimens and should not be used as a sole basis for treatment. Nasal washings and aspirates are unacceptable for Xpert Xpress SARS-CoV-2/FLU/RSV testing.  Fact Sheet for Patients: EntrepreneurPulse.com.au  Fact Sheet for Healthcare Providers: IncredibleEmployment.be  This test is not yet approved or cleared by the [place] FDA and has been authorized for detection and/or diagnosis of SARS-CoV-2 by FDA under an Emergency Use Authorization (EUA). This EUA will remain in effect (meaning this test can be used) for the duration of the COVID-19 declaration under Section 564(b)(1) of the Act, 21 U.S.C. section 360bbb-3(b)(1), unless the authorization is terminated or revoked.     Resp Syncytial Virus by PCR NEGATIVE NEGATIVE Final    Comment: (NOTE) Fact Sheet for Patients: EntrepreneurPulse.com.au  Fact Sheet for Healthcare Providers: IncredibleEmployment.be  This test is not yet approved or cleared by the [place] FDA and has been authorized for detection and/or diagnosis of SARS-CoV-2 by FDA under an Emergency Use  Authorization (EUA). This EUA will remain in effect (meaning this test can be used) for the duration of the COVID-19 declaration under Section 564(b)(1) of the Act, 21 U.S.C. section 360bbb-3(b)(1), unless the authorization is terminated or revoked.  Performed at [facility], [place] [address] [place], [place] [address]      Radiology Studies: DG Lumbar Spine Complete  Result Date: 09/16/2022 CLINICAL DATA:  Fall, back pain EXAM: LUMBAR SPINE - COMPLETE 4+ VIEW COMPARISON:  CT 07/21/2022 FINDINGS: There are compression deformities of T12 with 20-30% loss of height and anteriorly at L1 with 50% loss of height. Both fractures appear new since prior CT examination and fracture in T12 is acute to subacute in nature with widening of the paraspinal soft tissues likely related to surrounding edema or hemorrhage. No retropulsion. The posterior wall of the vertebral bodies appear intact on this limited examination. Remaining vertebral body height is preserved. Stable grade 2 anterolisthesis L5-S1. Intervertebral disc space narrowing and endplate remodeling of [id number]  again noted in keeping with changes of moderate to severe degenerative disc disease. Facet arthrosis at L5-S1 is not well profiled on this examination. Stable levoscoliosis of the lumbar spine, apex left at L5. Interval development of a mild dextrocurvature of the thoracolumbar spine, apex right at L1. IMPRESSION: 1. Suspected acute to subacute compression fracture of T12 with 50% loss of height. No retropulsion. Age indeterminate fracture L1 with 20-30% loss of height. Acuity could be confirmed with MRI examination if indicated. 2. Stable grade 2 anterolisthesis L5-S1. 3. Stable moderate to severe degenerative disc disease L3-S1. Electronically Signed   By: Fidela Salisbury M.D.   On: 09/16/2022 19:34   DG Chest 2 View  Result Date: 09/16/2022 CLINICAL DATA:  Cough EXAM: CHEST - 2 VIEW COMPARISON:  Chest x-ray 01/13/2022.  Chest  CT 07/21/2022. FINDINGS: There are faint multifocal patchy airspace opacities in the right mid lung and left lower lung. Small bilateral pleural effusions. Aorta is ectatic. Cardiac silhouette is within normal limits. Hiatal hernia present. No acute fractures. IMPRESSION: 1. Faint multifocal patchy airspace opacities in the right mid lung and left lower lung concerning for multifocal pneumonia. 2. Small bilateral pleural effusions. Electronically Signed   By: Ronney Asters M.D.   On: 09/16/2022 19:29   DG Hip Unilat W or Wo Pelvis 2-3 Views Left  Result Date: 09/16/2022 CLINICAL DATA:  Fall EXAM: DG HIP (WITH OR WITHOUT PELVIS) 2-3V LEFT COMPARISON:  None Available. FINDINGS: Normal alignment. No acute fracture or dislocation. Degenerative changes are seen at the lumbosacral junction and hips bilaterally, right greater than left. Soft tissues are unremarkable. IMPRESSION: 1. Degenerative change. No acute fracture or dislocation. Electronically Signed   By: Fidela Salisbury M.D.   On: 09/16/2022 19:28    Scheduled Meds:  [START ON 09/22/2022] levothyroxine  75 mcg Intravenous Daily   Continuous Infusions:  azithromycin     cefTRIAXone (ROCEPHIN)  IV       LOS: 0 days   Shelly Coss, MD [facility] P2/13/2024, 7:47 AM

## 2022-09-17 NOTE — Consult Note (Cosign Needed Addendum)
Consultation Note Date: 09/17/2022   Patient Name: Alison Bridges  DOB: [birth year]  MRN: [id number]  Age / Sex: 89 y.o., female  PCP: Burnard Bunting, MD Referring Physician: Shelly Coss, MD  Reason for Consultation: Establishing goals of care  HPI/Patient Profile: 89 y.o. female  with past medical history of myelodysplastic disease with pancytopenia, hypothyroidism, chronic kidney disease 3b, C. diff admitted from ALF on 09/16/2022 with fever and cough after fall last week with multifocal pneumonia. Also found to have T12 and L1 fracture.   Clinical Assessment and Goals of Care: Consult received. Chart reviewed. I met with Ms. [place] Crane and RN at bedside. Ms. Lemaster has recently had pain medication and is too sleepy to speak with me. RN reports that Ms. Trias was questioning if she even wanted platelet infusion and is not eating well. Seems she wants to have her pain controlled and allowed to rest. Ms. Wyszynski gives me permission to speak with her children.  I called and spoke with son, Rush Landmark, who added his sister [name] onto the call. They shares Ms. Gilder's history as her husband died ~10 yrs ago, she moved into [facility] ~2 yrs ago and thrived until fall in December. She has struggled since December and now is in increased pain and poor functional status. We reviewed her poor intake, significant pain burden, and unlikelihood that she will thrive from here. We discussed the potential of hospice facility, SNF rehab, vs SNF long term with or without hospice. After reviewing her status and expectations we agree that the best option for her would be hospice facility. Bill lives out of town and will come tomorrow so we can discuss further with Ms. Madan. Rush Landmark will speak with his mother and then I will meet them at 1pm to further discuss goals. If Ms. Stater confirms most likely plan is to transition to hospice facility with  comfort care and pain management.   All questions/concerns addressed. Emotional support provided.   Primary Decision Maker HCPOA son and daughter Rush Landmark and [name] Patient likely to make decisions for herself when not lethargic from pain medication    SUMMARY OF RECOMMENDATIONS   - DNR in place - Ongoing discussions with consideration of comfort care and hospice placement  Code Status/Advance Care Planning: DNR   Symptom Management:  Per attending.   Prognosis:  < 2 weeks very likely with full comfort care.   Discharge Planning: To Be Determined      Primary Diagnoses: Present on Admission:  Multifocal pneumonia  T12 compression fracture, initial encounter ([place])  Fall  MDS (myelodysplastic syndrome) ([place])  Stage 3b chronic kidney disease (CKD) ([place])   I have reviewed the medical record, interviewed the patient and family, and examined the patient. The following aspects are pertinent.  Past Medical History:  Diagnosis Date   Anemia    Arthritis    C. difficile diarrhea    Cancer ([place])    Chronic kidney disease    Depression    Dyspnea    Shortness of  breath with low hemoglobin   GERD (gastroesophageal reflux disease)    Hypothyroidism    Myelodysplastic disease ([place])    Osteoporosis    Social History   Socioeconomic History   Marital status: Widowed    Spouse name: Not on file   Number of children: Not on file   Years of education: Not on file   Highest education level: Not on file  Occupational History   Occupation: Oncologist    Comment: Retired  Tobacco Use   Smoking status: Never   Smokeless tobacco: Never  Vaping Use   Vaping Use: Never used  Substance and Sexual Activity   Alcohol use: No   Drug use: No   Sexual activity: Not on file  Other Topics Concern   Not on file  Social History Narrative   She is tried to stay active --  2 days a week she does senior aerobics.  She also enjoys going for walks, but no standard routine.       She has 3 children and 2 grandchildren.   Social Determinants of Health   Financial Resource Strain: Not on file  Food Insecurity: No Food Insecurity (09/17/2022)   Hunger Vital Sign    Worried About Running Out of Food in the Last Year: Never true    Ran Out of Food in the Last Year: Never true  Transportation Needs: No Transportation Needs (09/17/2022)   PRAPARE - Hydrologist (Medical): No    Lack of Transportation (Non-Medical): No  Physical Activity: Not on file  Stress: Not on file  Social Connections: Not on file   Family History  Problem Relation Age of Onset   Heart attack Mother 66   Pancreatic cancer Father    Parkinson's disease Sister    Diabetes Brother        Multiple complications   Heart disease Brother        Not sure what happened   Dementia Brother    Scheduled Meds:  sodium chloride   Intravenous Once   fluticasone furoate-vilanterol  1 puff Inhalation Daily   And   umeclidinium bromide  1 puff Inhalation Daily   [START ON 09/18/2022] levothyroxine  125 mcg Oral Q0600   nystatin  5 mL Oral QID   pantoprazole  40 mg Oral Daily   polyethylene glycol  17 g Oral Daily   senna-docusate  1 tablet Oral BID   Continuous Infusions:  azithromycin     cefTRIAXone (ROCEPHIN)  IV     PRN Meds:.acetaminophen **OR** acetaminophen, morphine injection, oxyCODONE No Known Allergies Review of Systems  Unable to perform ROS: Acuity of condition    Physical Exam Vitals and nursing note reviewed.  Constitutional:      General: She is sleeping. She is not in acute distress.    Appearance: She is ill-appearing.  Cardiovascular:     Rate and Rhythm: Tachycardia present.  Pulmonary:     Effort: No tachypnea, accessory muscle usage or respiratory distress.  Abdominal:     Palpations: Abdomen is soft.  Neurological:     Mental Status: She is lethargic.     Vital Signs: BP 133/64 (BP Location: Left Arm)   Pulse (!) 107   Temp 98  F (36.7 C) (Oral)   Resp 20   Ht 5' 8"$  (1.727 m)   Wt 54.7 kg   SpO2 96%   BMI 18.34 kg/m  Pain Scale: 0-10   Pain Score: Asleep  SpO2: SpO2: 96 % O2 Device:SpO2: 96 % O2 Flow Rate: .O2 Flow Rate (L/min): 2 L/min  IO: Intake/output summary:  Intake/Output Summary (Last 24 hours) at 09/17/2022 1454 Last data filed at 09/17/2022 1300 Gross per 24 hour  Intake 1090.11 ml  Output 300 ml  Net 790.11 ml    LBM: Last BM Date :  (pt unsure) Baseline Weight: Weight: 51.7 kg Most recent weight: Weight: 54.7 kg     Palliative Assessment/Data:     Time In: 1520  Time Total: 60 min  Greater than 50%  of this time was spent counseling and coordinating care related to the above assessment and plan.  Signed by: Vinie Sill, NP Palliative Medicine Team Pager # [phone] (M-F 8a-5p) Team Phone # [phone] (Nights/Weekends)

## 2022-09-17 NOTE — Assessment & Plan Note (Addendum)
COVID, influenza AMB, RSV are all negative.  Stable on room air.  Chest x-ray concerning for multifocal pneumonia.   -Continue azithromycin and ceftriaxone IV.  -Placed in observation on MedSurg -Monitor respiratory status -Follow up legionella and strep pneumoniae urine antigen

## 2022-09-17 NOTE — Progress Notes (Signed)
Orthopedic Tech Progress Note Patient Details:  Alison Bridges [birth year] [id number]  Patient ID: Alison Bridges, female   DOB: 01-14-1929, 89 y.o.   MRN: [id number] Called order into hanger Karolee Stamps 09/17/2022, 1:21 AM

## 2022-09-17 NOTE — Evaluation (Signed)
Occupational Therapy Evaluation Patient Details Name: Alison Bridges MRN: [id number] DOB: 03-27-1929 Today's Date: 09/17/2022   History of Present Illness Pt is a 89 y.o. female presenting to [facility] from [place] with low grade fever and a cough, sore throat, wheezing and shortness of breath for a few days. She has also been dealing with some left buttock/low back pain after a fall last week that resulted in a right ankle sprain. PMH significant for bladder cancer, osteoporosis, hypothyroidism, CKD, anemia, OA, depression, GERD, MD.   Clinical Impression   Patient is a 89 year old female who was admitted for above. Patient was living at [place] prior level with some caregiver support per patient report. Patient reported she had minimal help for bathing/dressing tasks.today patient is max A for ADL tasks with increased pain with all movements.concerns over swallowing were made aware to nurse, MD and SLP on this date.  Patient was noted to have decreased functional activity tolerance, increased pain, decreased ROM of BUE,decreased endurance, decreased standing balance, decreased safety awareness, and decreased knowledge of AD/AE impacting participation in ADLs.Patient would continue to benefit from skilled OT services at this time while admitted and after d/c to address noted deficits in order to improve overall safety and independence in ADLs.          Recommendations for follow up therapy are one component of a multi-disciplinary discharge planning process, led by the attending physician.  Recommendations may be updated based on patient status, additional functional criteria and insurance authorization.   Follow Up Recommendations  Skilled nursing-short term rehab (<3 hours/day)     Assistance Recommended at Discharge Frequent or constant Supervision/Assistance  Patient can return home with the following Two people to help with walking and/or transfers;A lot of help with  bathing/dressing/bathroom;Assistance with cooking/housework;Direct supervision/assist for medications management;Assist for transportation;Help with stairs or ramp for entrance;Direct supervision/assist for financial management;Assistance with feeding    Functional Status Assessment  Patient has had a recent decline in their functional status and demonstrates the ability to make significant improvements in function in a reasonable and predictable amount of time.  Equipment Recommendations  None recommended by OT       Precautions / Restrictions Precautions Precautions: Fall Precaution Booklet Issued: No Precaution Comments: back precautions, L wrist brace Required Braces or Orthoses: Spinal Brace Spinal Brace: Thoracolumbosacral orthotic;Lumbar corset;Other (comment) Spinal Brace Comments: TLSO turned into Lumbar corset due to height/length of brace limiting pt; brace ordered for comfort Restrictions Weight Bearing Restrictions: No      Mobility Bed Mobility Overal bed mobility: Needs Assistance Bed Mobility: Supine to Sit     Supine to sit: Min assist, HOB elevated          Transfers Overall transfer level: Needs assistance Equipment used: 2 person hand held assist Transfers: Sit to/from Stand, Bed to chair/wheelchair/BSC Sit to Stand: Min assist, +2 safety/equipment, +2 physical assistance     Step pivot transfers: Min assist, +2 safety/equipment            Balance Overall balance assessment: Needs assistance Sitting-balance support: Feet supported, Bilateral upper extremity supported Sitting balance-Leahy Scale: Fair     Standing balance support: During functional activity, Bilateral upper extremity supported Standing balance-Leahy Scale: Poor                             ADL either performed or assessed with clinical judgement   ADL Overall ADL's : Needs assistance/impaired Eating/Feeding:  Minimal assistance;Sitting Eating/Feeding Details  (indicate cue type and reason): patient was noted to have drool sitting in bed at rest coming out of mouth with patient not appearing to be aware. patient reported it had been happening for two weeks prior to arrival with sore throat. patient was provided with meal tray with patient noted to cough with each bite. patient on first bite of bacon was noted to have parital moving around in mouth and pocketing was noted in bottom lip and minimally in cheeks with patietn attempting to take a larger bite. patient was educated on taking small bites and taking small sips inbetween bites.patient verbalzied understanding. MD made aware that patient was coughing with each bite. SLP messaged to update on patients poor awareness with self feeding tasks. nurse was educated on concerns over self feeding tasks Grooming: Sitting;Minimal assistance   Upper Body Bathing: Minimal assistance;Sitting   Lower Body Bathing: Maximal assistance;Sitting/lateral leans   Upper Body Dressing : Minimal assistance;Sitting   Lower Body Dressing: Maximal assistance;Sitting/lateral leans Lower Body Dressing Details (indicate cue type and reason): unable to bring BLE into figure four position. Toilet Transfer: Minimal assistance;+2 for physical assistance;+2 for safety/equipment Toilet Transfer Details (indicate cue type and reason): HHA Toileting- Clothing Manipulation and Hygiene: Maximal assistance;Sit to/from stand       Functional mobility during ADLs: Minimal assistance;+2 for safety/equipment        Pertinent Vitals/Pain Pain Assessment Pain Assessment: Faces Faces Pain Scale: Hurts little more Pain Location: general back pain with mobility Pain Descriptors / Indicators: Aching, Discomfort Pain Intervention(s): Limited activity within patient's tolerance, Monitored during session, Repositioned     Hand Dominance Right   Extremity/Trunk Assessment Upper Extremity Assessment Upper Extremity Assessment: LUE  deficits/detail;RUE deficits/detail RUE Deficits / Details: patietn was noted to have large purple bruise covering scapular area on back. patient able to ABduct arm about 30 degrees and FF about 20 degrees with notable grimancing. patient declined to participate in PROM or AAROM at this time with pain RUE: Unable to fully assess due to pain LUE Deficits / Details: patient has a wrist brace on this forearm/hand. patient has a history of radial fx. patient able to AROM shoulder about 30 degrees FF. unclear if pain was in shoulders or back with movements. patient was not forthcoming with descriptions. LUE: Unable to fully assess due to pain   Lower Extremity Assessment Lower Extremity Assessment: Defer to PT evaluation   Cervical / Trunk Assessment Cervical / Trunk Assessment: Kyphotic   Communication Communication Communication: No difficulties   Cognition Arousal/Alertness: Awake/alert Behavior During Therapy: WFL for tasks assessed/performed Overall Cognitive Status: No family/caregiver present to determine baseline cognitive functioning       General Comments: patient knew she was in [place]. patient poor safety awareness with self feeding tasks. patient noted to be irritable at times with MD during session. patient did not wany [place] to call son at first reporting he was out of town                Home Living Family/patient expects to be discharged to:: Assisted living       Home Equipment: Kasandra Knudsen - quad;Grab bars - tub/shower   Additional Comments: From [place]      Prior Functioning/Environment Prior Level of Function : Needs assist  Cognitive Assist : Mobility (cognitive);ADLs (cognitive)     Physical Assist : Mobility (physical);ADLs (physical) Mobility (physical): Transfers;Gait;Bed mobility ADLs (physical): Bathing;Dressing;Toileting;Grooming;IADLs Mobility Comments: prio to fall in December pt was  independent with mobility. has declined gradually  since then and needs increased assist from staff at [place]. ADLs Comments: patient reported having assistance for LB dressing tasks and shower. patient reportd she helped a little with LB dressing tasks.        OT Problem List: Decreased activity tolerance;Impaired balance (sitting and/or standing);Decreased coordination;Decreased safety awareness;Decreased knowledge of precautions;Cardiopulmonary status limiting activity;Impaired UE functional use;Pain      OT Treatment/Interventions: Self-care/ADL training;Energy conservation;Therapeutic exercise;DME and/or AE instruction;Therapeutic activities;Patient/family education;Balance training    OT Goals(Current goals can be found in the care plan section) Acute Rehab OT Goals Patient Stated Goal: to eat breakfast OT Goal Formulation: With patient Time For Goal Achievement: 10/01/22 Potential to Achieve Goals: Fair  OT Frequency: Min 2X/week    Co-evaluation PT/OT/SLP Co-Evaluation/Treatment: Yes Reason for Co-Treatment: To address functional/ADL transfers PT goals addressed during session: Mobility/safety with mobility OT goals addressed during session: ADL's and self-care      AM-PAC OT "6 Clicks" Daily Activity     Outcome Measure Help from another person eating meals?: A Little Help from another person taking care of personal grooming?: A Little Help from another person toileting, which includes using toliet, bedpan, or urinal?: A Lot Help from another person bathing (including washing, rinsing, drying)?: A Lot Help from another person to put on and taking off regular upper body clothing?: A Lot Help from another person to put on and taking off regular lower body clothing?: A Lot 6 Click Score: 14   End of Session Equipment Utilized During Treatment: Gait belt Nurse Communication: Other (comment) (concerns over self feeding tasks)  Activity Tolerance: Patient limited by pain Patient left: in chair;with call bell/phone  within reach;with chair alarm set  OT Visit Diagnosis: Unsteadiness on feet (R26.81);Other abnormalities of gait and mobility (R26.89);Muscle weakness (generalized) (M62.81)                Time: [id number] OT Time Calculation (min): 28 min Charges:  OT General Charges $OT Visit: 1 Visit OT Evaluation $OT Eval Moderate Complexity: 1 Mod  Resa Rinks OTR/L, MS Acute Rehabilitation Department Office# [phone]   Willa Rough 09/17/2022, 12:42 PM

## 2022-09-18 DIAGNOSIS — Z7189 Other specified counseling: Secondary | ICD-10-CM | POA: Diagnosis not present

## 2022-09-18 DIAGNOSIS — R627 Adult failure to thrive: Secondary | ICD-10-CM | POA: Diagnosis not present

## 2022-09-18 DIAGNOSIS — D696 Thrombocytopenia, unspecified: Secondary | ICD-10-CM | POA: Diagnosis not present

## 2022-09-18 DIAGNOSIS — Z515 Encounter for palliative care: Secondary | ICD-10-CM | POA: Diagnosis not present

## 2022-09-18 DIAGNOSIS — N189 Chronic kidney disease, unspecified: Secondary | ICD-10-CM

## 2022-09-18 DIAGNOSIS — J189 Pneumonia, unspecified organism: Secondary | ICD-10-CM | POA: Diagnosis not present

## 2022-09-18 DIAGNOSIS — D469 Myelodysplastic syndrome, unspecified: Secondary | ICD-10-CM | POA: Diagnosis not present

## 2022-09-18 LAB — TYPE AND SCREEN
ABO/RH(D): O POS
Antibody Screen: NEGATIVE
Unit division: 0
Unit division: 0
Unit division: 0

## 2022-09-18 LAB — BPAM PLATELET PHERESIS
Blood Product Expiration Date: 202402132359
Blood Product Expiration Date: 202402152359
ISSUE DATE / TIME: 202402131506
ISSUE DATE / TIME: 202402131633
Unit Type and Rh: 1700
Unit Type and Rh: 7300

## 2022-09-18 LAB — CBC
HCT: 21.6 % — ABNORMAL LOW (ref 36.0–46.0)
Hemoglobin: 6.8 g/dL — CL (ref 12.0–15.0)
MCH: 31.5 pg (ref 26.0–34.0)
MCHC: 31.5 g/dL (ref 30.0–36.0)
MCV: 100 fL (ref 80.0–100.0)
Platelets: 17 10*3/uL — CL (ref 150–400)
RBC: 2.16 MIL/uL — ABNORMAL LOW (ref 3.87–5.11)
RDW: 18.8 % — ABNORMAL HIGH (ref 11.5–15.5)
WBC: 3.7 10*3/uL — ABNORMAL LOW (ref 4.0–10.5)
nRBC: 0 % (ref 0.0–0.2)

## 2022-09-18 LAB — LEGIONELLA PNEUMOPHILA SEROGP 1 UR AG: L. pneumophila Serogp 1 Ur Ag: NEGATIVE

## 2022-09-18 LAB — PREPARE PLATELET PHERESIS
Unit division: 0
Unit division: 0

## 2022-09-18 LAB — BASIC METABOLIC PANEL
Anion gap: 10 (ref 5–15)
BUN: 33 mg/dL — ABNORMAL HIGH (ref 8–23)
CO2: 19 mmol/L — ABNORMAL LOW (ref 22–32)
Calcium: 7.4 mg/dL — ABNORMAL LOW (ref 8.9–10.3)
Chloride: 105 mmol/L (ref 98–111)
Creatinine, Ser: 1.54 mg/dL — ABNORMAL HIGH (ref 0.44–1.00)
GFR, Estimated: 32 mL/min — ABNORMAL LOW (ref >60)
Glucose, Bld: 102 mg/dL — ABNORMAL HIGH (ref 70–99)
Potassium: 3.3 mmol/L — ABNORMAL LOW (ref 3.5–5.1)
Sodium: 134 mmol/L — ABNORMAL LOW (ref 135–145)

## 2022-09-18 LAB — BPAM RBC
Blood Product Expiration Date: 202402252359
Blood Product Expiration Date: 202403032359
Blood Product Expiration Date: 202403112359
ISSUE DATE / TIME: 202402081108
ISSUE DATE / TIME: 202402090805
Unit Type and Rh: 5100
Unit Type and Rh: 5100
Unit Type and Rh: 5100

## 2022-09-18 LAB — IGG, IGA, IGM
IgA: 393 mg/dL (ref 64–422)
IgG (Immunoglobin G), Serum: 1813 mg/dL — ABNORMAL HIGH (ref 586–1602)
IgM (Immunoglobulin M), Srm: 343 mg/dL — ABNORMAL HIGH (ref 26–217)

## 2022-09-18 LAB — PREPARE RBC (CROSSMATCH)

## 2022-09-18 MED ORDER — POTASSIUM CHLORIDE 20 MEQ PO PACK
40.0000 meq | PACK | Freq: Once | ORAL | Status: AC
  Administered 2022-09-18: 40 meq via ORAL
  Filled 2022-09-18: qty 2

## 2022-09-18 MED ORDER — SODIUM CHLORIDE 0.9% IV SOLUTION: Freq: Once | INTRAVENOUS | Status: AC

## 2022-09-18 MED ORDER — GUAIFENESIN 100 MG/5ML PO LIQD: 10.0000 mL | ORAL | Status: DC | PRN

## 2022-09-18 MED ORDER — MORPHINE SULFATE (PF) 2 MG/ML IV SOLN: 1.0000 mg | INTRAVENOUS | Status: DC | PRN

## 2022-09-18 NOTE — TOC Progression Note (Signed)
Transition of Care [facility]) - Progression Note    Patient Details  Name: Alison Bridges MRN: [id number] Date of Birth: 08/01/1929  [facility] CM/SW [place], RN Phone Number:[phone]  09/18/2022, 2:31 PM  Clinical Narrative:    [facility] acknowledges consult for SNF. Awaiting palliative meeting due to per palliative notes plan is to meet with son to discuss [place] with possible shift towards comfort.         Expected Discharge Plan and Services                                               Social Determinants of Health (SDOH) Interventions SDOH Screenings   Food Insecurity: No Food Insecurity (09/17/2022)  Housing: Low Risk  (09/17/2022)  Transportation Needs: No Transportation Needs (09/17/2022)  Utilities: Not At Risk (09/17/2022)  Depression (PHQ2-9): Low Risk  (02/29/2020)  Tobacco Use: Low Risk  (09/17/2022)    Readmission Risk Interventions    07/25/2022    3:02 PM  Readmission Risk Prevention Plan  Transportation Screening Complete  PCP or Specialist Appt within 5-7 Days Complete  Home Care Screening Complete  Medication Review (RN CM) Referral to Pharmacy

## 2022-09-18 NOTE — Progress Notes (Signed)
PROGRESS NOTE  Alison Bridges  [id number] DOB: April 16, 1929 DOA: 09/16/2022 PCP: Burnard Bunting, MD   Brief Narrative: Patient is a 89 year old female with history of myelodysplastic syndrome, CKD stage IIIb, depression,hypothyroidism who presented from ALF with complaint of fever, cough, right foot pain.  She reported fever up to 101F. Cough was productive with whitish sputum.  Patient had a fall about 2 weeks ago  and developed low back pain.   She was admitted here in December and was discharged on 12/22 after management for left wrist laceration/fracture, distal fibular fracture, left gluteal hematoma.  Orthopedics recommended splint placement of the left wrist and outpatient follow-up.  On presentation, she was tachycardic, hypertensive but saturating fine on room air.  Afebrile.  Lab work showed pancytopenia.  Chest x-ray showed multifocal pneumonia.  Started on broad-spectrum antibiotics to cover for community-acquired pneumonia.  Hematology/oncology also consulted for severe pancytopenia.  Palliative care consulted for goals of care given her age and comorbidities.  Palliative care meeting with family today, oncology recommending  comfort based approach  Assessment & Plan:  Principal Problem:   Multifocal pneumonia Active Problems:   MDS (myelodysplastic syndrome) ([place])   Stage 3b chronic kidney disease (CKD) ([place])   T12 compression fracture, initial encounter ([place])   Hypothyroidism   Fall   Compression fracture of L1 lumbar vertebra ([place])   CAP (community acquired pneumonia)   Other neutropenia ([place])   Thrombocytopenia ([place])   Counseling regarding advance care planning and goals of care   Neutropenic sepsis ([place])   Multifocal pneumonia: Presented with fever, cough.  She is not hypoxic on presentation but she is on 2 L of oxygen per minute now.  Lungs are almost clear on auscultation.  Chest x-ray showed multifocal patchy airspace opacities in the right mid lung and left lower  lung concerning for multifocal pneumonia.  Started on ceftriaxone, azithromycin.    Pancytopenia/myelodysplastic syndrome: Severe pancytopenia.  Platelet of 17, hemoglobin of 6.8 today, ANC of 630 on admission.  We consulted hematology.She follows with Dr. Irene Limbo . Patient had severe thrombocytopenia,s/p 2 units of platelet transfusion on 2/13.  Ordered a unit of PRBC today because hemoglobin is 6.8.  Also on Granix. Hematology/oncology recommending comfort care/hospice  Recent history of fall/compression fracture: History of multiple falls.  Last fall about 2 weeks ago.  Lumbar x-ray showed suspected acute to subacute compression fracture of T12 with 50% loss of height. No retropulsion.  Also showed age indeterminate fracture L1 with 20-30% loss of height.  PT/OT consulted.  Patient does not complain of severe back pain so most likely will manage this compression fracture conservatively.  Continue TLSO brace while out of bed. she is  also not a candidate for kyphoplasty due to her severe thrombocytopenia She was admitted here in December and was discharged on 12/22 after management for left wrist laceration/fracture, distal fibular fracture, left gluteal hematoma.  Orthopedics recommended splint placement of the left wrist and outpatient follow-up.  She was recommended weightbearing as tolerated for left distal fibular fracture.  PT/OT recommending SNF on discharge  Right foot pain: Recent history of fall.  X-ray of right ankle showed mild lateral malleolar soft tissue swelling. No acute fracture.  Continue supportive care, pain management  Hypothyroidism: Continue Synthyroid  CKD stage IIIb: Baseline creatinine around  1.4.  Currently kidney function close to  baseline.  Avoid nephrotoxic agents.  Monitor kidney function  Hypokalemia: Supplemented with potassium  Goals of care: Elderly patient with multiple comorbidities including MDS.  CODE STATUS DNR. Being followed by  palliative care for further  goals of care.  Hospice/comfort care will be the best approach for her       DVT prophylaxis:SCDs Start: 09/17/22 1000     Code Status: DNR  Family Communication: Long discussion held with the daughter and son on phone on 2/13  Patient status:Obs  Patient is from :ALF/ILF  Anticipated discharge to:SNF vs residential hospice  Estimated DC date:not sure   Consultants: Hematology/oncology, palliative care  Procedures: None  Antimicrobials:  Anti-infectives (From admission, onward)    Start     Dose/Rate Route Frequency Ordered Stop   09/17/22 2000  cefTRIAXone (ROCEPHIN) 2 g in sodium chloride 0.9 % 100 mL IVPB        2 g 200 mL/hr over 30 Minutes Intravenous Every 24 hours 09/17/22 0105 09/21/22 1959   09/17/22 2000  azithromycin (ZITHROMAX) 500 mg in sodium chloride 0.9 % 250 mL IVPB        500 mg 250 mL/hr over 60 Minutes Intravenous Every 24 hours 09/17/22 0105 09/21/22 1959   09/16/22 2000  cefTRIAXone (ROCEPHIN) 1 g in sodium chloride 0.9 % 100 mL IVPB        1 g 200 mL/hr over 30 Minutes Intravenous  Once 09/16/22 1946 09/16/22 2109   09/16/22 2000  azithromycin (ZITHROMAX) 500 mg in sodium chloride 0.9 % 250 mL IVPB        500 mg 250 mL/hr over 60 Minutes Intravenous  Once 09/16/22 1946 09/16/22 2136       Subjective: Patient seen and examined at bedside today.  Hemodynamically stable.  Still on 2 L of oxygen.  Denies any worsening shortness of breath or cough.  Overall appears comfortable but very deconditioned, weak.  Objective: Vitals:   09/17/22 1701 09/17/22 1820 09/17/22 1957 09/18/22 0551  BP: (!) 147/64 (!) 149/64 134/62 (!) 137/56  Pulse: (!) 102 (!) 103 100 96  Resp: 17 20 20 20  $ Temp: 97.6 F (36.4 C) 100 F (37.8 C) 99.3 F (37.4 C) 97.9 F (36.6 C)  TempSrc:  Oral Oral Oral  SpO2:  97% 98% 97%  Weight:      Height:        Intake/Output Summary (Last 24 hours) at 09/18/2022 1016 Last data filed at 09/18/2022 0300 Gross per 24 hour   Intake 1071.33 ml  Output --  Net 1071.33 ml   Filed Weights   09/16/22 1713 09/17/22 0518  Weight: 51.7 kg 54.7 kg    Examination:   General exam:not in distress, pleasant elderly female, very deconditioned, weak, chronically looking HEENT: PERRL Respiratory system:  no wheezes or crackles, diminished air sounds on bases Cardiovascular system: Sinus tachycardia Gastrointestinal system: Abdomen is nondistended, soft and nontender. Central nervous system: Alert and oriented Extremities: No edema, no clubbing ,no cyanosis, splint in the left wrist, immobilizer on the left foot Skin: No rashes, no ulcers,no icterus     Data Reviewed: I have personally reviewed following labs and imaging studies  CBC: Recent Labs  Lab 09/12/22 1124 09/16/22 1840 09/17/22 0610 09/18/22 0646  WBC 1.6* 1.9* 1.8* 3.7*  NEUTROABS  --  0.7*  --   --   HGB 6.6* 7.3* 7.2* 6.8*  HCT 20.2* 22.5* 23.2* 21.6*  MCV 100.0 99.1 100.4* 100.0  PLT 37* 21* 17* 17*   Basic Metabolic Panel: Recent Labs  Lab 09/16/22 1840 09/17/22 0610 09/18/22 0646  NA 132* 136 134*  K 3.8 3.3* 3.3*  CL  106 107 105  CO2 19* 21* 19*  GLUCOSE 109* 104* 102*  BUN 39* 39* 33*  CREATININE 1.58* 1.59* 1.54*  CALCIUM 7.9* 7.8* 7.4*     Recent Results (from the past 240 hour(s))  Blood Culture (routine x 2)     Status: None (Preliminary result)   Collection Time: 09/16/22  6:40 PM   Specimen: BLOOD  Result Value Ref Range Status   Specimen Description   Final    BLOOD BLOOD RIGHT ARM Performed at [facility], [place] [address], [place], [place] [address]    Special Requests   Final    BOTTLES DRAWN AEROBIC AND ANAEROBIC Blood Culture adequate volume Performed at [facility] [address], [place], [place] [address]    Culture   Final    NO GROWTH 2 DAYS Performed at [facility] [address] [place], [place] [address]    Report Status PENDING  Incomplete   Blood Culture (routine x 2)     Status: None (Preliminary result)   Collection Time: 09/16/22  6:57 PM   Specimen: BLOOD  Result Value Ref Range Status   Specimen Description   Final    BLOOD BLOOD LEFT ARM Performed at [facility] [address] [place], [place] [address]    Special Requests   Final    BOTTLES DRAWN AEROBIC AND ANAEROBIC Blood Culture adequate volume Performed at [facility] [address] [place], [place] [address]    Culture   Final    NO GROWTH 2 DAYS Performed at [facility] [address], [place], [place] [address]    Report Status PENDING  Incomplete  Resp panel by RT-PCR (RSV, Flu A&B, Covid) Anterior Nasal Swab     Status: None   Collection Time: 09/16/22  7:05 PM   Specimen: Anterior Nasal Swab  Result Value Ref Range Status   SARS Coronavirus 2 by RT PCR NEGATIVE NEGATIVE Final    Comment: (NOTE) SARS-CoV-2 target nucleic acids are NOT DETECTED.  The SARS-CoV-2 RNA is generally detectable in upper respiratory specimens during the acute phase of infection. The lowest concentration of SARS-CoV-2 viral copies this assay can detect is 138 copies/mL. A negative result does not preclude SARS-Cov-2 infection and should not be used as the sole basis for treatment or other patient management decisions. A negative result may occur with  improper specimen collection/handling, submission of specimen other than nasopharyngeal swab, presence of viral mutation(s) within the areas targeted by this assay, and inadequate number of viral copies(<138 copies/mL). A negative result must be combined with clinical observations, patient history, and epidemiological information. The expected result is Negative.  Fact Sheet for Patients:  EntrepreneurPulse.com.au  Fact Sheet for Healthcare Providers:  IncredibleEmployment.be  This test is no t yet approved or cleared by the [place] FDA and  has been authorized for detection and/or diagnosis of SARS-CoV-2 by FDA under an Emergency Use Authorization (EUA). This EUA will remain  in effect (meaning this test can be used) for the duration of the COVID-19 declaration under Section 564(b)(1) of the Act, 21 U.S.C.section 360bbb-3(b)(1), unless the authorization is terminated  or revoked sooner.       Influenza A by PCR NEGATIVE NEGATIVE Final   Influenza B by PCR NEGATIVE NEGATIVE Final    Comment: (NOTE) The Xpert Xpress SARS-CoV-2/FLU/RSV plus assay is intended as an aid in the diagnosis of influenza from Nasopharyngeal swab specimens and  should not be used as a sole basis for treatment. Nasal washings and aspirates are unacceptable for Xpert Xpress SARS-CoV-2/FLU/RSV testing.  Fact Sheet for Patients: EntrepreneurPulse.com.au  Fact Sheet for Healthcare Providers: IncredibleEmployment.be  This test is not yet approved or cleared by the [place] FDA and has been authorized for detection and/or diagnosis of SARS-CoV-2 by FDA under an Emergency Use Authorization (EUA). This EUA will remain in effect (meaning this test can be used) for the duration of the COVID-19 declaration under Section 564(b)(1) of the Act, 21 U.S.C. section 360bbb-3(b)(1), unless the authorization is terminated or revoked.     Resp Syncytial Virus by PCR NEGATIVE NEGATIVE Final    Comment: (NOTE) Fact Sheet for Patients: EntrepreneurPulse.com.au  Fact Sheet for Healthcare Providers: IncredibleEmployment.be  This test is not yet approved or cleared by the [place] FDA and has been authorized for detection and/or diagnosis of SARS-CoV-2 by FDA under an Emergency Use Authorization (EUA). This EUA will remain in effect (meaning this test can be used) for the duration of the COVID-19 declaration under Section 564(b)(1) of the Act, 21 U.S.C. section  360bbb-3(b)(1), unless the authorization is terminated or revoked.  Performed at [facility], [place] [address] [place], [place] [address]      Radiology Studies: DG Lumbar Spine Complete  Result Date: 09/16/2022 CLINICAL DATA:  Fall, back pain EXAM: LUMBAR SPINE - COMPLETE 4+ VIEW COMPARISON:  CT 07/21/2022 FINDINGS: There are compression deformities of T12 with 20-30% loss of height and anteriorly at L1 with 50% loss of height. Both fractures appear new since prior CT examination and fracture in T12 is acute to subacute in nature with widening of the paraspinal soft tissues likely related to surrounding edema or hemorrhage. No retropulsion. The posterior wall of the vertebral bodies appear intact on this limited examination. Remaining vertebral body height is preserved. Stable grade 2 anterolisthesis L5-S1. Intervertebral disc space narrowing and endplate remodeling of [id number] again noted in keeping with changes of moderate to severe degenerative disc disease. Facet arthrosis at L5-S1 is not well profiled on this examination. Stable levoscoliosis of the lumbar spine, apex left at L5. Interval development of a mild dextrocurvature of the thoracolumbar spine, apex right at L1. IMPRESSION: 1. Suspected acute to subacute compression fracture of T12 with 50% loss of height. No retropulsion. Age indeterminate fracture L1 with 20-30% loss of height. Acuity could be confirmed with MRI examination if indicated. 2. Stable grade 2 anterolisthesis L5-S1. 3. Stable moderate to severe degenerative disc disease L3-S1. Electronically Signed   By: Fidela Salisbury M.D.   On: 09/16/2022 19:34   DG Chest 2 View  Result Date: 09/16/2022 CLINICAL DATA:  Cough EXAM: CHEST - 2 VIEW COMPARISON:  Chest x-ray 01/13/2022.  Chest CT 07/21/2022. FINDINGS: There are faint multifocal patchy airspace opacities in the right mid lung and left lower lung. Small bilateral pleural effusions. Aorta is ectatic. Cardiac  silhouette is within normal limits. Hiatal hernia present. No acute fractures. IMPRESSION: 1. Faint multifocal patchy airspace opacities in the right mid lung and left lower lung concerning for multifocal pneumonia. 2. Small bilateral pleural effusions. Electronically Signed   By: Ronney Asters M.D.   On: 09/16/2022 19:29   DG Hip Unilat W or Wo Pelvis 2-3 Views Left  Result Date: 09/16/2022 CLINICAL DATA:  Fall EXAM: DG HIP (WITH OR WITHOUT PELVIS) 2-3V LEFT COMPARISON:  None Available. FINDINGS: Normal alignment. No acute fracture or dislocation. Degenerative changes are seen at the lumbosacral junction and  hips bilaterally, right greater than left. Soft tissues are unremarkable. IMPRESSION: 1. Degenerative change. No acute fracture or dislocation. Electronically Signed   By: Fidela Salisbury M.D.   On: 09/16/2022 19:28    Scheduled Meds:  sodium chloride   Intravenous Once   fluticasone furoate-vilanterol  1 puff Inhalation Daily   And   umeclidinium bromide  1 puff Inhalation Daily   levothyroxine  125 mcg Oral Q0600   nystatin  5 mL Oral QID   pantoprazole  40 mg Oral Daily   polyethylene glycol  17 g Oral Daily   senna-docusate  1 tablet Oral BID   Tbo-filgastrim (GRANIX) SQ  300 mcg Subcutaneous q1800   Continuous Infusions:  azithromycin Stopped (09/17/22 2150)   cefTRIAXone (ROCEPHIN)  IV Stopped (09/17/22 2039)     LOS: 1 day   Shelly Coss, MD [facility] P2/14/2024, 10:16 AM

## 2022-09-18 NOTE — Progress Notes (Signed)
Manufacturing engineer [facility]) Hospital Liaison Note   Received request from PMT NP/Alicia P. to contact family to explain hospice services ([place]). MSW contacted son Bill/[phone] & provided extensive education for hospice services.  Family requested additional time to discuss options with additional family before making a final decision. MSW voiced understanding. All questions answered and no concerns voiced.  At this time, patient is not under review for [facility] services as family continues to have ongoing [place] discussions. Family will notify either [facility] or [facility] IDT with updates on POC.    [facility] information and contact numbers given to family & above information shared with TOC/[place] & PMT provider/Alicia P., NP.   Please call with any questions/concerns.    Thank you for the opportunity to participate in this patient's care.   Phillis Haggis, MSW [facility] Liaison  [phone]

## 2022-09-18 NOTE — TOC Progression Note (Signed)
Transition of Care [facility]) - Progression Note    Patient Details  Name: Alison Bridges MRN: [id number] Date of Birth: Nov 17, 1928  Transition of Care [facility]) CM/SW [place], RN Phone Number:[phone]  09/18/2022, 4:13 PM  Clinical Narrative:    [facility] acknowledges consult for [facility] place Referral. Per secure chat palliative Vinie Sill NP has already reached out to [facility] for [employer] [facility] [employer] bed availability.         Expected Discharge Plan and Services                                               Social Determinants of Health (SDOH) Interventions SDOH Screenings   Food Insecurity: No Food Insecurity (09/17/2022)  Housing: Low Risk  (09/17/2022)  Transportation Needs: No Transportation Needs (09/17/2022)  Utilities: Not At Risk (09/17/2022)  Depression (PHQ2-9): Low Risk  (02/29/2020)  Tobacco Use: Low Risk  (09/17/2022)    Readmission Risk Interventions    07/25/2022    3:02 PM  Readmission Risk Prevention Plan  Transportation Screening Complete  PCP or Specialist Appt within 5-7 Days Complete  Home Care Screening Complete  Medication Review (RN CM) Referral to Pharmacy

## 2022-09-18 NOTE — Progress Notes (Signed)
Palliative:  HPI: 89 y.o. female  with past medical history of myelodysplastic disease with pancytopenia, hypothyroidism, chronic kidney disease 3b, C. diff admitted from ALF on 09/16/2022 with fever and cough after fall last week with multifocal pneumonia. Also found to have T12 and L1 fracture.     I met again today with Alison Bridges along with her son and daughter-in-law, Rush Landmark and Eustaquio Maize, along with daughter [name] via telephone. We discussed with [name] her health status and path forward. We discussed continued care vs comfort care. We discussed concerns that even with continued care her quality of life and prognosis are poor. She will need higher level of care and I do feel that she would qualify for hospice facility if she elects full comfort care. She will need transition to SNF for 24/7 care if wanted continued interventions. [name] shares that she is at peace that her time may be limited and she expresses that she does not see the point in continuing interventions that will prolong her life in this state. We discussed more what comfort path would look life. She shares that she is not afraid to die. The more we discussed this option [name] become a little overwhelmed. I reassured her that this is just a conversation and we certainly do not need to make any final decisions right now today if she is not completely sure that is what she wants. Ms. Soles confirms that she would like some more time to process as this idea was just introduced to her a couple hours ago. After discussion with them they have decided to continue with blood transfusion for now and then we can reassess if these interventions are even helping her at all or helping her to feel better and this will assist with decision making.   They had good questions that I answered to the best of my ability. Emotional support provided.   Exam: Alert, oriented. Ill appearing. Tachycardic. Breathing regular, unlabored but with increased congestion  and weak cough to clear. Generalized weakness and fatigue.   Plan: - Anticipate likely transition to full comfort care in the coming days. Patient requests more time to process and ensure this is her desire.  - Referral to [facility] made at their request.  - I will follow up again tomorrow.   [place], NP Palliative Medicine Team Pager [phone] (Please see [email or web address] for schedule) Team Phone [phone]    Greater than 50%  of this time was spent counseling and coordinating care related to the above assessment and plan

## 2022-09-19 DIAGNOSIS — Z515 Encounter for palliative care: Secondary | ICD-10-CM | POA: Diagnosis not present

## 2022-09-19 DIAGNOSIS — D469 Myelodysplastic syndrome, unspecified: Secondary | ICD-10-CM | POA: Diagnosis not present

## 2022-09-19 DIAGNOSIS — J189 Pneumonia, unspecified organism: Secondary | ICD-10-CM | POA: Diagnosis not present

## 2022-09-19 DIAGNOSIS — D709 Neutropenia, unspecified: Secondary | ICD-10-CM

## 2022-09-19 DIAGNOSIS — Z7189 Other specified counseling: Secondary | ICD-10-CM | POA: Diagnosis not present

## 2022-09-19 LAB — CBC WITH DIFFERENTIAL/PLATELET
Abs Immature Granulocytes: 0.2 10*3/uL — ABNORMAL HIGH (ref 0.00–0.07)
Basophils Absolute: 0 10*3/uL (ref 0.0–0.1)
Basophils Relative: 0 %
Eosinophils Absolute: 0 10*3/uL (ref 0.0–0.5)
Eosinophils Relative: 0 %
HCT: 26.6 % — ABNORMAL LOW (ref 36.0–46.0)
Hemoglobin: 8.4 g/dL — ABNORMAL LOW (ref 12.0–15.0)
Immature Granulocytes: 7 %
Lymphocytes Relative: 18 %
Lymphs Abs: 0.5 10*3/uL — ABNORMAL LOW (ref 0.7–4.0)
MCH: 30.8 pg (ref 26.0–34.0)
MCHC: 31.6 g/dL (ref 30.0–36.0)
MCV: 97.4 fL (ref 80.0–100.0)
Monocytes Absolute: 0.5 10*3/uL (ref 0.1–1.0)
Monocytes Relative: 17 %
Neutro Abs: 1.8 10*3/uL (ref 1.7–7.7)
Neutrophils Relative %: 58 %
Platelets: 10 10*3/uL — CL (ref 150–400)
RBC: 2.73 MIL/uL — ABNORMAL LOW (ref 3.87–5.11)
RDW: 19.5 % — ABNORMAL HIGH (ref 11.5–15.5)
WBC: 3 10*3/uL — ABNORMAL LOW (ref 4.0–10.5)
nRBC: 0.7 % — ABNORMAL HIGH (ref 0.0–0.2)

## 2022-09-19 LAB — BPAM RBC
Blood Product Expiration Date: 202403032359
ISSUE DATE / TIME: 202402141511
Unit Type and Rh: 5100

## 2022-09-19 LAB — MAGNESIUM: Magnesium: 1.7 mg/dL (ref 1.7–2.4)

## 2022-09-19 LAB — BASIC METABOLIC PANEL
Anion gap: 12 (ref 5–15)
BUN: 32 mg/dL — ABNORMAL HIGH (ref 8–23)
CO2: 19 mmol/L — ABNORMAL LOW (ref 22–32)
Calcium: 7.7 mg/dL — ABNORMAL LOW (ref 8.9–10.3)
Chloride: 105 mmol/L (ref 98–111)
Creatinine, Ser: 1.69 mg/dL — ABNORMAL HIGH (ref 0.44–1.00)
GFR, Estimated: 28 mL/min — ABNORMAL LOW (ref >60)
Glucose, Bld: 109 mg/dL — ABNORMAL HIGH (ref 70–99)
Potassium: 3 mmol/L — ABNORMAL LOW (ref 3.5–5.1)
Sodium: 136 mmol/L (ref 135–145)

## 2022-09-19 LAB — TYPE AND SCREEN
ABO/RH(D): O POS
Antibody Screen: NEGATIVE
Unit division: 0

## 2022-09-19 MED ORDER — MAGNESIUM SULFATE 2 GM/50ML IV SOLN
2.0000 g | Freq: Once | INTRAVENOUS | Status: AC
  Administered 2022-09-19: 2 g via INTRAVENOUS
  Filled 2022-09-19: qty 50

## 2022-09-19 MED ORDER — POTASSIUM CHLORIDE 20 MEQ PO PACK
40.0000 meq | PACK | ORAL | Status: AC
  Administered 2022-09-19: 40 meq via ORAL
  Filled 2022-09-19 (×2): qty 2

## 2022-09-19 MED ORDER — POTASSIUM CHLORIDE CRYS ER 20 MEQ PO TBCR
40.0000 meq | EXTENDED_RELEASE_TABLET | Freq: Two times a day (BID) | ORAL | Status: DC
  Administered 2022-09-19 – 2022-09-20 (×2): 40 meq via ORAL
  Filled 2022-09-19 (×2): qty 2

## 2022-09-19 MED ORDER — SERTRALINE HCL 100 MG PO TABS
100.0000 mg | ORAL_TABLET | Freq: Every day | ORAL | Status: DC
  Administered 2022-09-19 – 2022-09-20 (×2): 100 mg via ORAL
  Filled 2022-09-19 (×2): qty 1

## 2022-09-19 NOTE — Progress Notes (Signed)
Manufacturing engineer [facility]) Hospital Liaison Note   Family has elected to proceed with BP evaluation per NP/Alicia P. Evaluation to occur on 2.16   Please call with any questions/concerns.    Thank you for the opportunity to participate in this patient's care.   Phillis Haggis, MSW [facility] Liaison  [phone]

## 2022-09-19 NOTE — Progress Notes (Signed)
HEMATOLOGY/ONCOLOGY INPATIENT PROGRESS NOTE  Date of Service: 09/18/2022  Inpatient Attending: .Shelly Coss, MD   SUBJECTIVE  Patient was seen in hematologic follow-up.  Patient's son Alison Bridges was at bedside.  Patient is more awake and alert today. Getting PRBC transfusion this morning.  CBC this morning did show improvement in the patient's WBC count and likely resolution of neutropenia with her G-CSF injection.  Still continues to have significant thrombocytopenia needing support.  Getting 1 unit of PRBC this morning for symptomatic anemia. Supported Goals of care discussion with the patient's son and patient.  OBJECTIVE:  NAD  PHYSICAL EXAMINATION: . Vitals:   09/18/22 1531 09/18/22 1802 09/18/22 2039 09/19/22 0516  BP: 124/63 (!) 128/52 (!) 133/57 (!) 141/59  Pulse: 97 98 98 98  Resp: 17 16 16 18  $ Temp: 97.9 F (36.6 C) 98 F (36.7 C) 98.1 F (36.7 C) 97.8 F (36.6 C)  TempSrc:  Oral Oral   SpO2:  98% 96% 95%  Weight:      Height:       Filed Weights   09/16/22 1713 09/17/22 0518  Weight: 113 lb 15.7 oz (51.7 kg) 120 lb 9.5 oz (54.7 kg)   .Body mass index is 18.34 kg/m.  GENERAL:alert, in no acute distress, mild distress from pain in the back. LUNGS: clear to auscultation few scattered rhonchi HEART: regular rate & rhythm, NEURO: no focal motor/sensory deficits  MEDICAL HISTORY:  Past Medical History:  Diagnosis Date   Anemia    Arthritis    C. difficile diarrhea    Cancer (HCC)    Chronic kidney disease    Depression    Dyspnea    Shortness of breath with low hemoglobin   GERD (gastroesophageal reflux disease)    Hypothyroidism    Myelodysplastic disease ([place])    Osteoporosis     SURGICAL HISTORY: Past Surgical History:  Procedure Laterality Date   ABDOMINAL HYSTERECTOMY     APPENDECTOMY  1998   CATARACT EXTRACTION W/ INTRAOCULAR LENS IMPLANT Bilateral    CHOLECYSTECTOMY  1998   TRANSURETHRAL RESECTION OF BLADDER TUMOR N/A 05/08/2022    Procedure: TRANSURETHRAL RESECTION OF BLADDER TUMOR (TURBT);  Surgeon: [name], MD;  Location: [facility];  Service: Urology;  Laterality: N/A;  ONLY NEEDS 30 MIN    SOCIAL HISTORY: Social History   Socioeconomic History   Marital status: Widowed    Spouse name: Not on file   Number of children: Not on file   Years of education: Not on file   Highest education level: Not on file  Occupational History   Occupation: Oncologist    Comment: Retired  Tobacco Use   Smoking status: Never   Smokeless tobacco: Never  Vaping Use   Vaping Use: Never used  Substance and Sexual Activity   Alcohol use: No   Drug use: No   Sexual activity: Not on file  Other Topics Concern   Not on file  Social History Narrative   She is tried to stay active --  2 days a week she does senior aerobics.  She also enjoys going for walks, but no standard routine.      She has 3 children and 2 grandchildren.   Social Determinants of Health   Financial Resource Strain: Not on file  Food Insecurity: No Food Insecurity (09/17/2022)   Hunger Vital Sign    Worried About Running Out of Food in the Last Year: Never true    Ran Out of Food in  the Last Year: Never true  Transportation Needs: No Transportation Needs (09/17/2022)   PRAPARE - Hydrologist (Medical): No    Lack of Transportation (Non-Medical): No  Physical Activity: Not on file  Stress: Not on file  Social Connections: Not on file  Intimate Partner Violence: Not At Risk (09/17/2022)   Humiliation, Afraid, Rape, and Kick questionnaire    Fear of Current or Ex-Partner: No    Emotionally Abused: No    Physically Abused: No    Sexually Abused: No    FAMILY HISTORY: Family History  Problem Relation Age of Onset   Heart attack Mother 85   Pancreatic cancer Father    Parkinson's disease Sister    Diabetes Brother        Multiple complications   Heart disease Brother        Not sure what happened    Dementia Brother     ALLERGIES:  has No Known Allergies.  MEDICATIONS:  Scheduled Meds:  fluticasone furoate-vilanterol  1 puff Inhalation Daily   And   umeclidinium bromide  1 puff Inhalation Daily   levothyroxine  125 mcg Oral Q0600   nystatin  5 mL Oral QID   pantoprazole  40 mg Oral Daily   polyethylene glycol  17 g Oral Daily   potassium chloride  40 mEq Oral Q2H   senna-docusate  1 tablet Oral BID   Tbo-filgastrim (GRANIX) SQ  300 mcg Subcutaneous q1800   Continuous Infusions:  azithromycin 500 mg (09/18/22 2107)   cefTRIAXone (ROCEPHIN)  IV Stopped (09/18/22 2051)   PRN Meds:.acetaminophen **OR** acetaminophen, guaiFENesin, morphine injection, oxyCODONE  REVIEW OF SYSTEMS:    10 Point review of Systems was done is negative except as noted above.   LABORATORY DATA:  I have reviewed the data as listed .    Latest Ref Rng & Units 09/18/2022    6:46 AM 09/17/2022    6:10 AM  CBC  WBC 4.0 - 10.5 K/uL 3.7  1.8   Hemoglobin 12.0 - 15.0 g/dL 6.8  7.2   Hematocrit 36.0 - 46.0 % 21.6  23.2   Platelets 150 - 400 K/uL 17  17    .    Latest Ref Rng & Units 09/18/2022    6:46 AM 09/17/2022    6:10 AM  CMP  Glucose 70 - 99 mg/dL 102  104   BUN 8 - 23 mg/dL 33  39   Creatinine 0.44 - 1.00 mg/dL 1.54  1.59   Sodium 135 - 145 mmol/L 134  136   Potassium 3.5 - 5.1 mmol/L 3.3  3.3   Chloride 98 - 111 mmol/L 105  107   CO2 22 - 32 mmol/L 19  21   Calcium 8.9 - 10.3 mg/dL 7.4  7.8      RADIOGRAPHIC STUDIES: I have personally reviewed the radiological images as listed and agreed with the findings in the report. DG Lumbar Spine Complete  Result Date: 09/16/2022 CLINICAL DATA:  Fall, back pain EXAM: LUMBAR SPINE - COMPLETE 4+ VIEW COMPARISON:  CT 07/21/2022 FINDINGS: There are compression deformities of T12 with 20-30% loss of height and anteriorly at L1 with 50% loss of height. Both fractures appear new since prior CT examination and fracture in T12 is acute to subacute  in nature with widening of the paraspinal soft tissues likely related to surrounding edema or hemorrhage. No retropulsion. The posterior wall of the vertebral bodies appear intact on this limited examination. Remaining  vertebral body height is preserved. Stable grade 2 anterolisthesis L5-S1. Intervertebral disc space narrowing and endplate remodeling of [id number] again noted in keeping with changes of moderate to severe degenerative disc disease. Facet arthrosis at L5-S1 is not well profiled on this examination. Stable levoscoliosis of the lumbar spine, apex left at L5. Interval development of a mild dextrocurvature of the thoracolumbar spine, apex right at L1. IMPRESSION: 1. Suspected acute to subacute compression fracture of T12 with 50% loss of height. No retropulsion. Age indeterminate fracture L1 with 20-30% loss of height. Acuity could be confirmed with MRI examination if indicated. 2. Stable grade 2 anterolisthesis L5-S1. 3. Stable moderate to severe degenerative disc disease L3-S1. Electronically Signed   By: Fidela Salisbury M.D.   On: 09/16/2022 19:34   DG Chest 2 View  Result Date: 09/16/2022 CLINICAL DATA:  Cough EXAM: CHEST - 2 VIEW COMPARISON:  Chest x-ray 01/13/2022.  Chest CT 07/21/2022. FINDINGS: There are faint multifocal patchy airspace opacities in the right mid lung and left lower lung. Small bilateral pleural effusions. Aorta is ectatic. Cardiac silhouette is within normal limits. Hiatal hernia present. No acute fractures. IMPRESSION: 1. Faint multifocal patchy airspace opacities in the right mid lung and left lower lung concerning for multifocal pneumonia. 2. Small bilateral pleural effusions. Electronically Signed   By: Ronney Asters M.D.   On: 09/16/2022 19:29   DG Hip Unilat W or Wo Pelvis 2-3 Views Left  Result Date: 09/16/2022 CLINICAL DATA:  Fall EXAM: DG HIP (WITH OR WITHOUT PELVIS) 2-3V LEFT COMPARISON:  None Available. FINDINGS: Normal alignment. No acute fracture or dislocation.  Degenerative changes are seen at the lumbosacral junction and hips bilaterally, right greater than left. Soft tissues are unremarkable. IMPRESSION: 1. Degenerative change. No acute fracture or dislocation. Electronically Signed   By: Fidela Salisbury M.D.   On: 09/16/2022 19:28   DG Ankle Complete Right  Result Date: 09/10/2022 CLINICAL DATA:  Right ankle pain.  Possible injury yesterday. EXAM: RIGHT ANKLE - COMPLETE 3+ VIEW COMPARISON:  Right ankle radiographs 12/08/2019 FINDINGS: There is diffuse decreased bone mineralization. The ankle mortise is symmetric and intact. Tiny plantar calcaneal heel spur. Possible mild lateral malleolar soft tissue swelling, similar to prior. No acute fracture is seen. No dislocation. IMPRESSION: Possible mild lateral malleolar soft tissue swelling. No acute fracture. Electronically Signed   By: Yvonne Kendall M.D.   On: 09/10/2022 17:37    ASSESSMENT & PLAN:   Alison Bridges is a 89 y.o. female with:   Multifocal community acquired pneumonia in the context of significant neutropenia. Severe thrombocytopenia worsened from baseline due to infection and antibiotics. Neutropenia chronic from her MDS-appears resolved today with her G-CSF Symptomatic anemia with hemoglobin of 7.2.  Has been on Aranesp 300 mcg 4 weeks as outpatient Pancytopenia - likely from MDS +/- MPN -01/10/2020 [facility] Surgical Pathology Report ([id number]) revealed "BONE MARROW, ASPIRATE, CLOT, CORE: -Hypercellular marrow with myeloid hyperplasia and megakaryocytic atypia  PERIPHERAL BLOOD: -  Pancytopenia". -12/16/19 JAK2 (including V617 and Exon 12), MPL, and CALR-Next Generation Sequencing shows "Tier II: Variants of Potential Clinical Significance - SRSF2 p.Pro95His" 6. Hepatosplenomegaly  -Likely from her MDS/MPN CT scan in December 2023.   7.history of noninvasive low-grade papillary urothelial carcinoma: --Underwent TURBT on 05/08/2022. Under the care of urologist, Dr. Lovena Neighbours.   8.  History of  C.Diff diarrhea:  9.  Recurrent falls with failure to thrive and radiation to her left wrist left ankle and compression fractures in her spine.  10.  Failure to thrive with worsening performance status PLAN: -Patient is getting her PRBC transfusion in the afternoon of 09/18/2022 after was ordered in the evening of 09/17/2022. -Her WBC counts this morning are improved after her G-CSF showed -She was also ordered for her monthly Aranesp shot. -Continue to be significant thrombocytopenic below her baseline counts of 50-60k. -Continued goals of care discussion with the patient and her son at bedside.  -I did discuss that given the ongoing burden of care for her MDS with likely need for transfusion support, concerns with recurrent infection, likely increasing needs for transfusion support, variable mentation, concerns for recurrent falls and failure to thrive, nutritional challenges -consideration of transitioning to best supportive cares through hospice is a reasonable consideration. -Alison Bridges notes that he is still talking to his mother and some other family members prior to making the final decision but might be moving to versus best supportive cares with hospice. -If family and patient not desirous of ongoing supportive treatments for the MDS with cytopenias would typically transfuse for hemoglobin less than 7.5 and her context and prophylactic platelet transfusion for platelets less than <=20k in the context of pneumonia and sepsis and prophylactically for platelets less than 10k if infection is controlled.  The total time spent in the appointment was 35 minutes*.  All of the patient's questions were answered with apparent satisfaction. The patient knows to call the clinic with any problems, questions or concerns.   Alison Lone MD MS AAHIVMS [facility] Hematology/Oncology Physician [facility]  .*Total Encounter Time as defined by the Centers for Medicare and Medicaid Services includes,  in addition to the face-to-face time of a patient visit (documented in the note above) non-face-to-face time: obtaining and reviewing outside history, ordering and reviewing medications, tests or procedures, care coordination (communications with other health care professionals or caregivers) and documentation in the medical record.

## 2022-09-19 NOTE — Progress Notes (Signed)
Palliative:  HPI: HPI: 89 y.o. female  with past medical history of myelodysplastic disease with pancytopenia, hypothyroidism, chronic kidney disease 3b, C. diff admitted from ALF on 09/16/2022 with fever and cough after fall last week with multifocal pneumonia. Also found to have T12 and L1 fracture.   I met today with Alison Bridges. I reviewed with her that platelets continue to worsen despite transfusions. Her HGB did improve with transfusion but she feels no different. She has her breakfast but has only eaten a few bites - appetite remains poor. She continues to express feeling overwhelmed with the decision and how to move forward. She expresses that Alison Bridges is planning to come this afternoon and she would like to discuss with him. I confirm that this is a good idea.   Update: I returned to bedside and spoke with Alison Bridges while Alison Bridges was over phone. We reviewed poor progress and continued decline. We discussed that her prognosis is likely < 2 weeks if transition to full comfort care. Unfortunately she has not responded to interventions to improve her prognosis or her quality of life. Alison Bridges and his sisters spoke with Alison Bridges together as a family. They all agree that they would desire transition to [facility] for full comfort care at end of life. Alison Bridges requests transfer Saturday is possible. We reviewed that [facility] will review for eligibility and then notify about bed availability if approved.   Exam: Alert, oriented. Generalized weakness and fatigue. Pain with movement/activity. Breathing regular, unlabored but with cough and congestion. Abd soft.   Plan: - No plans for further transfusion.  - Hopes for transition to [facility] for comfort care at end of life.   [place], NP Palliative Medicine Team Pager [phone] (Please see [email or web address] for schedule) Team Phone [phone]    Greater than 50%  of this time was spent counseling and coordinating care related to the above  assessment and plan

## 2022-09-19 NOTE — Progress Notes (Signed)
PROGRESS NOTE  Alison Bridges  [id number] DOB: [birth year] DOA: 09/16/2022 PCP: Burnard Bunting, MD   Brief Narrative: Patient is a 89 year old female with history of myelodysplastic syndrome, CKD stage IIIb, depression,hypothyroidism who presented from ALF with complaint of fever, cough, right foot pain.  She reported fever up to 101F. Cough was productive with whitish sputum.  Patient had a fall about 2 weeks ago  and developed low back pain.   She was admitted here in December and was discharged on 12/22 after management for left wrist laceration/fracture, distal fibular fracture, left gluteal hematoma.  Orthopedics recommended splint placement of the left wrist and outpatient follow-up.  On presentation, she was tachycardic, hypertensive but saturating fine on room air.  Afebrile.  Lab work showed pancytopenia.  Chest x-ray showed multifocal pneumonia.  Started on broad-spectrum antibiotics to cover for community-acquired pneumonia.  Hematology/oncology also consulted for severe pancytopenia.  Palliative care consulted for goals of care given her age and comorbidities. Oncology recommending  comfort based approach.  Ongoing goals of care, most likely recommendation will be comfort care/hospice  Assessment & Plan:  Principal Problem:   Multifocal pneumonia Active Problems:   MDS (myelodysplastic syndrome) (HCC)   Stage 3b chronic kidney disease (CKD) (HCC)   T12 compression fracture, initial encounter ([place])   Hypothyroidism   Fall   Compression fracture of L1 lumbar vertebra ([place])   CAP (community acquired pneumonia)   Other neutropenia ([place])   Thrombocytopenia ([place])   Counseling regarding advance care planning and goals of care   Neutropenic sepsis ([place])   Neutropenia ([place])   Multifocal pneumonia: Presented with fever, cough.  She is not hypoxic on presentation but she is on 2 L of oxygen per minute now.  Lungs are almost clear on auscultation.  Chest x-ray showed multifocal patchy  airspace opacities in the right mid lung and left lower lung concerning for multifocal pneumonia.  Started on ceftriaxone, azithromycin.  On room air this morning.  Pancytopenia/myelodysplastic syndrome: Severe pancytopenia.  Platelet of 17, hemoglobin of 6.8 today, ANC of 630 on admission.  We consulted hematology.She follows with Dr. Irene Limbo . Patient had severe thrombocytopenia,s/p 2 units of platelet transfusion on 2/13.  Ordered a unit of PRBC on 2/14 because hemoglobin is 6.8.  Also on Granix. Hematology/oncology recommending comfort care/hospice.  Will hold on additional transfusion ,ongoing goals of care ,and family not willing for transfusion  Recent history of fall/compression fracture: History of multiple falls.  Last fall about 2 weeks ago.  Lumbar x-ray showed suspected acute to subacute compression fracture of T12 with 50% loss of height. No retropulsion.  Also showed age indeterminate fracture L1 with 20-30% loss of height.  PT/OT consulted.  Patient does not complain of severe back pain so most likely will manage this compression fracture conservatively.  Continue TLSO brace while out of bed. she is  also not a candidate for kyphoplasty due to her severe thrombocytopenia She was admitted here in December and was discharged on 12/22 after management for left wrist laceration/fracture, distal fibular fracture, left gluteal hematoma.  Orthopedics recommended splint placement of the left wrist and outpatient follow-up.  She was recommended weightbearing as tolerated for left distal fibular fracture.  PT/OT recommended SNF on discharge  Right foot pain: Recent history of fall.  X-ray of right ankle showed mild lateral malleolar soft tissue swelling. No acute fracture.  Continue supportive care, pain management  Hypothyroidism: Continue Synthyroid  CKD stage IIIb: Baseline creatinine around  1.4.  Currently kidney  function close to  baseline.  Avoid nephrotoxic agents.  Monitor kidney  function  Hypokalemia: Supplemented with potassium  Goals of care: Elderly patient with multiple comorbidities including MDS.  CODE STATUS DNR. Being followed by  palliative care for further goals of care.  Hospice/comfort care will be the best approach for her       DVT prophylaxis:SCDs Start: 09/17/22 1000     Code Status: DNR  Family Communication: Long discussion held with the daughter and son on phone on 2/13  Patient status:Obs  Patient is from :ALF/ILF  Anticipated discharge to:SNF vs residential hospice  Estimated DC date:not sure   Consultants: Hematology/oncology, palliative care  Procedures: None  Antimicrobials:  Anti-infectives (From admission, onward)    Start     Dose/Rate Route Frequency Ordered Stop   09/17/22 2000  cefTRIAXone (ROCEPHIN) 2 g in sodium chloride 0.9 % 100 mL IVPB        2 g 200 mL/hr over 30 Minutes Intravenous Every 24 hours 09/17/22 0105 09/21/22 1959   09/17/22 2000  azithromycin (ZITHROMAX) 500 mg in sodium chloride 0.9 % 250 mL IVPB        500 mg 250 mL/hr over 60 Minutes Intravenous Every 24 hours 09/17/22 0105 09/21/22 1959   09/16/22 2000  cefTRIAXone (ROCEPHIN) 1 g in sodium chloride 0.9 % 100 mL IVPB        1 g 200 mL/hr over 30 Minutes Intravenous  Once 09/16/22 1946 09/16/22 2109   09/16/22 2000  azithromycin (ZITHROMAX) 500 mg in sodium chloride 0.9 % 250 mL IVPB        500 mg 250 mL/hr over 60 Minutes Intravenous  Once 09/16/22 1946 09/16/22 2136       Subjective: Patient seen and examined at bedside today.  Hemodynamically stable.  Overall looks comfortable, lying in bed.  On room air today.  Denies any worsening shortness of breath or cough.  Had a bowel movement today.  Complains of some spasms in the back  Objective: Vitals:   09/18/22 2039 09/19/22 0516 09/19/22 0904 09/19/22 0945  BP: (!) 133/57 (!) 141/59  (!) 144/61  Pulse: 98 98  92  Resp: 16 18  18  $ Temp: 98.1 F (36.7 C) 97.8 F (36.6 C)  98 F (36.7  C)  TempSrc: Oral   Oral  SpO2: 96% 95% 93% 92%  Weight:      Height:        Intake/Output Summary (Last 24 hours) at 09/19/2022 1146 Last data filed at 09/19/2022 1000 Gross per 24 hour  Intake 862 ml  Output 400 ml  Net 462 ml   Filed Weights   09/16/22 1713 09/17/22 0518  Weight: 51.7 kg 54.7 kg    Examination:   General exam: Overall comfortable, not in distress, very malnourished, weak, chronically ill looking HEENT: PERRL Respiratory system:  no wheezes or crackles, mildly diminished air sounds on bases Cardiovascular system: S1 & S2 heard, RRR.  Gastrointestinal system: Abdomen is nondistended, soft and nontender. Central nervous system: Alert and oriented Extremities: No edema, no clubbing ,no cyanosis Skin: No rashes, no ulcers,no icterus     Data Reviewed: I have personally reviewed following labs and imaging studies  CBC: Recent Labs  Lab 09/16/22 1840 09/17/22 0610 09/18/22 0646 09/19/22 0629  WBC 1.9* 1.8* 3.7* 3.0*  NEUTROABS 0.7*  --   --  1.8  HGB 7.3* 7.2* 6.8* 8.4*  HCT 22.5* 23.2* 21.6* 26.6*  MCV 99.1 100.4* 100.0 97.4  PLT  21* 17* 17* 10*   Basic Metabolic Panel: Recent Labs  Lab 09/16/22 1840 09/17/22 0610 09/18/22 0646 09/19/22 0629  NA 132* 136 134* 136  K 3.8 3.3* 3.3* 3.0*  CL 106 107 105 105  CO2 19* 21* 19* 19*  GLUCOSE 109* 104* 102* 109*  BUN 39* 39* 33* 32*  CREATININE 1.58* 1.59* 1.54* 1.69*  CALCIUM 7.9* 7.8* 7.4* 7.7*  MG  --   --   --  1.7     Recent Results (from the past 240 hour(s))  Blood Culture (routine x 2)     Status: None (Preliminary result)   Collection Time: 09/16/22  6:40 PM   Specimen: BLOOD  Result Value Ref Range Status   Specimen Description   Final    BLOOD BLOOD RIGHT ARM Performed at [facility] [address], [place], [place] [address]    Special Requests   Final    BOTTLES DRAWN AEROBIC AND ANAEROBIC Blood Culture adequate volume Performed at [facility] [address] [place], [place] [address]    Culture   Final    NO GROWTH 3 DAYS Performed at [facility], [place] [address] [place], [place] [address]    Report Status PENDING  Incomplete  Blood Culture (routine x 2)     Status: None (Preliminary result)   Collection Time: 09/16/22  6:57 PM   Specimen: BLOOD  Result Value Ref Range Status   Specimen Description   Final    BLOOD BLOOD LEFT ARM Performed at [facility] [address], [place], [place] [address]    Special Requests   Final    BOTTLES DRAWN AEROBIC AND ANAEROBIC Blood Culture adequate volume Performed at [facility] [address] [place], [place] [address]    Culture   Final    NO GROWTH 3 DAYS Performed at [facility], [place] [address], [place], [place] [address]    Report Status PENDING  Incomplete  Resp panel by RT-PCR (RSV, Flu A&B, Covid) Anterior Nasal Swab     Status: None   Collection Time: 09/16/22  7:05 PM   Specimen: Anterior Nasal Swab  Result Value Ref Range Status   SARS Coronavirus 2 by RT PCR NEGATIVE NEGATIVE Final    Comment: (NOTE) SARS-CoV-2 target nucleic acids are NOT DETECTED.  The SARS-CoV-2 RNA is generally detectable in upper respiratory specimens during the acute phase of infection. The lowest concentration of SARS-CoV-2 viral copies this assay can detect is 138 copies/mL. A negative result does not preclude SARS-Cov-2 infection and should not be used as the sole basis for treatment or other patient management decisions. A negative result may occur with  improper specimen collection/handling, submission of specimen other than nasopharyngeal swab, presence of viral mutation(s) within the areas targeted by this assay, and inadequate number of viral copies(<138 copies/mL). A negative result must be combined with clinical observations, patient history, and epidemiological information. The expected result is  Negative.  Fact Sheet for Patients:  EntrepreneurPulse.com.au  Fact Sheet for Healthcare Providers:  IncredibleEmployment.be  This test is no t yet approved or cleared by the [place] FDA and  has been authorized for detection and/or diagnosis of SARS-CoV-2 by FDA under an Emergency Use Authorization (EUA). This EUA will remain  in effect (meaning this test can be used) for the duration of the COVID-19 declaration under Section 564(b)(1) of the Act, 21 U.S.C.section 360bbb-3(b)(1), unless the authorization is  terminated  or revoked sooner.       Influenza A by PCR NEGATIVE NEGATIVE Final   Influenza B by PCR NEGATIVE NEGATIVE Final    Comment: (NOTE) The Xpert Xpress SARS-CoV-2/FLU/RSV plus assay is intended as an aid in the diagnosis of influenza from Nasopharyngeal swab specimens and should not be used as a sole basis for treatment. Nasal washings and aspirates are unacceptable for Xpert Xpress SARS-CoV-2/FLU/RSV testing.  Fact Sheet for Patients: EntrepreneurPulse.com.au  Fact Sheet for Healthcare Providers: IncredibleEmployment.be  This test is not yet approved or cleared by the [place] FDA and has been authorized for detection and/or diagnosis of SARS-CoV-2 by FDA under an Emergency Use Authorization (EUA). This EUA will remain in effect (meaning this test can be used) for the duration of the COVID-19 declaration under Section 564(b)(1) of the Act, 21 U.S.C. section 360bbb-3(b)(1), unless the authorization is terminated or revoked.     Resp Syncytial Virus by PCR NEGATIVE NEGATIVE Final    Comment: (NOTE) Fact Sheet for Patients: EntrepreneurPulse.com.au  Fact Sheet for Healthcare Providers: IncredibleEmployment.be  This test is not yet approved or cleared by the [place] FDA and has been authorized for detection and/or diagnosis of  SARS-CoV-2 by FDA under an Emergency Use Authorization (EUA). This EUA will remain in effect (meaning this test can be used) for the duration of the COVID-19 declaration under Section 564(b)(1) of the Act, 21 U.S.C. section 360bbb-3(b)(1), unless the authorization is terminated or revoked.  Performed at [facility], [place] [address], [place], [place] [address]      Radiology Studies: No results found.  Scheduled Meds:  fluticasone furoate-vilanterol  1 puff Inhalation Daily   And   umeclidinium bromide  1 puff Inhalation Daily   levothyroxine  125 mcg Oral Q0600   nystatin  5 mL Oral QID   pantoprazole  40 mg Oral Daily   polyethylene glycol  17 g Oral Daily   potassium chloride  40 mEq Oral Q2H   senna-docusate  1 tablet Oral BID   sertraline  100 mg Oral Daily   Tbo-filgastrim (GRANIX) SQ  300 mcg Subcutaneous q1800   Continuous Infusions:  azithromycin 500 mg (09/18/22 2107)   cefTRIAXone (ROCEPHIN)  IV Stopped (09/18/22 2051)     LOS: 2 days   Shelly Coss, MD [facility] P2/15/2024, 11:46 AM

## 2022-09-19 NOTE — Plan of Care (Signed)
  Problem: Clinical Measurements: Goal: Ability to maintain clinical measurements within normal limits will improve Outcome: Progressing   Problem: Clinical Measurements: Goal: Will remain free from infection Outcome: Progressing   

## 2022-09-19 NOTE — Progress Notes (Signed)
Speech Language Pathology Treatment: Dysphagia  Patient Details Name: Alison Bridges MRN: [id number] DOB: 1928-12-19 Today's Date: 09/19/2022 Time: 1735-1800 SLP Time Calculation (min) (ACUTE ONLY): 25 min  Assessment / Plan / Recommendation Clinical Impression  Pt seen to assure comfortable with po diet as pt with significant discomfort while swallowing thin liquids during eval due to overt coughing and her fractures.   Today pt observed to cough at baseline with much improved comfort.   She reports pleasure with the blueberry pomegranate drink her son brought for her.  Pt reports she is drinking thin water between meals but admits she is dissatisfied with the nectar liquids.  Observed her today consuming thin, nectar, peas, dry chicken and ice cream.  Prolonged mastication of dry chicken observed - - SLP added butter to chicken for moisture.  She used mashed potatoes to clear.  Offered to provide warmed applesauce to clear food particles into pharynx - pt politely declined.    Subtle delayed throat clear and cough noted x2 after liquid swallows but no obvious discomfort. Pt also states she is going to hospice for EOL care as her 8 transfusions have not helped her.  At this time, will advance to regular/thin diet as pt is much improved.     HPI HPI: 89 yo from SNF, recent fall with left buttock pain - found to have T12 fx, bilateral pna. Pt PMH + for bladder cancer s/p TURPD Oct 2023, fall December 2023, achalasia *per pt* and s/p 2 dilatations - she purports last one was 50 years ago.  Pt also reports she does not desire repeat EGD.   Stomach/Bowel: 07/2022 Small hiatal hernia is seen. There is fluid in the lumen of thoracic esophagus suggesting possible gastroesophageal reflux. High density is seen in the lumen of the thoracic esophagus, stomach and duodenum, possibly oral contrast or oral medication.  Records dating back to 05/2019 (? when pt had COVID indicative pt likely had reflux and/or esophageal  dysmotility.   Pt admits to weight loss of 10 pounds unitentionally over the last 3-4 months, which she attributes to lack of appetite. She admits to having issues coughing with food and/or drink - resulting in strong coughing episode at least 2-3 times a week.  Denies recurrent pneumonias.      SLP Plan  All goals met      Recommendations for follow up therapy are one component of a multi-disciplinary discharge planning process, led by the attending physician.  Recommendations may be updated based on patient status, additional functional criteria and insurance authorization.    Recommendations  Diet recommendations: Regular;Thin liquid Medication Administration: Whole meds with puree Supervision: Patient able to self feed Compensations: Slow rate;Small sips/bites (use puree to orally transit solids that pt has difficulty swallowing) Postural Changes and/or Swallow Maneuvers: Seated upright 90 degrees;Upright 30-60 min after meal                Oral Care Recommendations: Oral care BID Follow Up Recommendations: Follow physician's recommendations for discharge plan and follow up therapies SLP Visit Diagnosis: Dysphagia, pharyngeal phase (R13.13);Dysphagia, pharyngoesophageal phase (R13.14) Plan: All goals met          Kathleen Lime, MS [facility] SLP Acute Rehab Services Office [phone]  Macario Golds  09/19/2022, 6:22 PM

## 2022-09-20 DIAGNOSIS — J189 Pneumonia, unspecified organism: Secondary | ICD-10-CM | POA: Diagnosis not present

## 2022-09-20 LAB — BASIC METABOLIC PANEL
Anion gap: 12 (ref 5–15)
BUN: 31 mg/dL — ABNORMAL HIGH (ref 8–23)
CO2: 19 mmol/L — ABNORMAL LOW (ref 22–32)
Calcium: 8 mg/dL — ABNORMAL LOW (ref 8.9–10.3)
Chloride: 109 mmol/L (ref 98–111)
Creatinine, Ser: 1.85 mg/dL — ABNORMAL HIGH (ref 0.44–1.00)
GFR, Estimated: 25 mL/min — ABNORMAL LOW (ref >60)
Glucose, Bld: 110 mg/dL — ABNORMAL HIGH (ref 70–99)
Potassium: 3.3 mmol/L — ABNORMAL LOW (ref 3.5–5.1)
Sodium: 140 mmol/L (ref 135–145)

## 2022-09-20 LAB — CBC
HCT: 26.5 % — ABNORMAL LOW (ref 36.0–46.0)
Hemoglobin: 8.5 g/dL — ABNORMAL LOW (ref 12.0–15.0)
MCH: 31.1 pg (ref 26.0–34.0)
MCHC: 32.1 g/dL (ref 30.0–36.0)
MCV: 97.1 fL (ref 80.0–100.0)
Platelets: 5 10*3/uL — CL (ref 150–400)
RBC: 2.73 MIL/uL — ABNORMAL LOW (ref 3.87–5.11)
RDW: 19.5 % — ABNORMAL HIGH (ref 11.5–15.5)
WBC: 3.2 10*3/uL — ABNORMAL LOW (ref 4.0–10.5)
nRBC: 1.3 % — ABNORMAL HIGH (ref 0.0–0.2)

## 2022-09-20 NOTE — Progress Notes (Addendum)
Manufacturing engineer [facility]) Hospital Liaison Note  Family elected to proceed with evaluation for [facility].  Visited patient at bedside and spoke with son/William to confirm interest and explain services.  Approval for [employer] [facility] is determined by [facility] MD. Patient has been approved. [facility] to notify hospital staff & family once a bed offer is made.   Addendum: A bed has become available for transfer today & family has accepted. Consent forms to be completed by [facility].  [facility] to be notified of patient D/C and transport arranged by TOC/Ella. Attending Physician/Dr. Tawanna Solo also notified of transport arrangement.    Please send signed DNR form with patient and RN call report to [phone].    Phillis Haggis, MSW [facility] Hospital Liaison [phone]

## 2022-09-20 NOTE — TOC Transition Note (Signed)
Transition of Care [facility]) - CM/SW Discharge Note   Patient Details  Name: Alison Bridges MRN: [id number] Date of Birth: 03-Feb-1929  Transition of Care [facility]) CM/SW Contact:  Angelita Ingles, RN Phone Number:[phone]  09/20/2022, 12:35 PM   Clinical Narrative:    CM received notification form [facility] stating that patient has been approved for  [facility] and that bed is available. Per Larene Beach Junious with [facility] patient is ok to discharge to [facility] with a 8 pm pick up. CM has scheduled pickup for 8pm via [facility].  Son  Marguerette Garski has been updated. Discharge packet is at nurses station. No other TOC needs noted at this time. TOC will sign off.   Please call report to: [facility] [phone]   Final next level of care: [place] Barriers to Discharge: No Barriers Identified   Patient Goals and CMS Choice CMS Medicare.gov Compare Post Acute Care list provided to:: Patient Represenative (must comment) Choice offered to / list presented to : Adult Children  Discharge Placement                Patient chooses bed at:  [facility]) Patient to be transferred to facility by: [facility] Name of family member notified: Ward Ammirati Patient and family notified of of transfer: 09/20/22  Discharge Plan and Services Additional resources added to the After Visit Summary for                  DME Arranged: N/A DME Agency: NA       HH Arranged: NA HH Agency: NA        Social Determinants of Health (SDOH) Interventions SDOH Screenings   Food Insecurity: No Food Insecurity (09/17/2022)  Housing: Low Risk  (09/17/2022)  Transportation Needs: No Transportation Needs (09/17/2022)  Utilities: Not At Risk (09/17/2022)  Depression (PHQ2-9): Low Risk  (02/29/2020)  Tobacco Use: Low Risk  (09/17/2022)     Readmission Risk Interventions    07/25/2022    3:02 PM  Readmission Risk Prevention Plan  Transportation Screening Complete  PCP or Specialist  Appt within 5-7 Days Complete  Home Care Screening Complete  Medication Review (RN CM) Referral to Pharmacy

## 2022-09-20 NOTE — Care Management Important Message (Signed)
Important Message  Patient Details IM Letter given. Name: Alison Bridges MRN: [id number] Date of Birth: [birth year]   Medicare Important Message Given:  Yes     Kerin Salen 09/20/2022, 10:11 AM

## 2022-09-20 NOTE — Progress Notes (Signed)
OT Cancellation Note  Patient Details Name: Alison Bridges MRN: [id number] DOB: 1928/12/09   Cancelled Treatment:    Reason Eval/Treat Not Completed: Other (comment). Patient being discharged to residential hospice. OT will sign off.  Allexus Ovens L Travis Mastel 09/20/2022, 12:04 PM

## 2022-09-20 NOTE — Discharge Summary (Signed)
Physician Discharge Summary  Alison Bridges [id number] DOB: June 24, 1929 DOA: 09/16/2022  PCP: Burnard Bunting, MD  Admit date: 09/16/2022 Discharge date: 09/20/2022  Admitted From: Home Disposition:  residential hospice  Discharge Condition:Stable CODE STATUS:[place] Care Diet recommendation:  Regular   Brief/Interim Summary: Patient is a 89 year old female with history of myelodysplastic syndrome, CKD stage IIIb, depression,hypothyroidism who presented from ALF with complaint of fever, cough, right foot pain.  She reported fever up to 101F. Cough was productive with whitish sputum.  Patient had a fall about 2 weeks ago  and developed low back pain.   She was admitted here in December and was discharged on 12/22 after management for left wrist laceration/fracture, distal fibular fracture, left gluteal hematoma.  Orthopedics recommended splint placement of the left wrist and outpatient follow-up.  On presentation, she was tachycardic, hypertensive but saturating fine on room air.  Afebrile.  Lab work showed pancytopenia.  Chest x-ray showed multifocal pneumonia.  Started on broad-spectrum antibiotics to cover for community-acquired pneumonia.  Hematology/oncology also consulted for severe pancytopenia.  Palliative care consulted for goals of care given her age and comorbidities. Oncology recommending  comfort based approach.  After discussion with family, decision made on residential hospice.  She can be discharged to [facility] whenever possible  Following problems were addressed during the hospitalization:  Multifocal pneumonia: Presented with fever, cough.  Chest x-ray showed multifocal patchy airspace opacities in the right mid lung and left lower lung concerning for multifocal pneumonia.  Started on ceftriaxone, azithromycin,finished course.  Now on comfort care   Pancytopenia/myelodysplastic syndrome: Severe pancytopenia.  Platelet of 17, hemoglobin of 6.8 today, ANC of 630 on admission.   We consulted hematology.She follows with Dr. Irene Limbo . Patient had severe thrombocytopenia,s/p 2 units of platelet transfusion on 2/13.  Ordered a unit of PRBC on 2/14 because hemoglobin is 6.8.  was also given Granix. Now on comfort care   Recent history of fall/compression fracture: History of multiple falls.  Last fall about 2 weeks ago.  Lumbar x-ray showed suspected acute to subacute compression fracture of T12 with 50% loss of height. No retropulsion.  Also showed age indeterminate fracture L1 with 20-30% loss of height. recommended TLSO brace while out of bed. she is  also not a candidate for kyphoplasty due to her severe thrombocytopenia She was admitted here in December and was discharged on 12/22 after management for left wrist laceration/fracture, distal fibular fracture, left gluteal hematoma.  Orthopedics recommended splint placement of the left wrist and outpatient follow-up.  She was recommended weightbearing as tolerated for left distal fibular fracture.  PT/OT recommended SNF on discharge.But now on comfort care   Right foot pain: Recent history of fall.  X-ray of right ankle showed mild lateral malleolar soft tissue swelling. No acute fracture.    Hypothyroidism: She was on  Synthyroid   CKD stage IIIb: Baseline creatinine around  1.4.  Currently kidney function close to  baseline.    Hypokalemia: Supplemented with potassium during this hospitalization   Goals of care: Elderly patient with multiple comorbidities including MDS.  CODE STATUS DNR.  Plan for discharge to residential hospice   discharge Diagnoses:  Principal Problem:   Multifocal pneumonia Active Problems:   MDS (myelodysplastic syndrome) (HCC)   Stage 3b chronic kidney disease (CKD) ([place])   T12 compression fracture, initial encounter ([place])   Hypothyroidism   Fall   Compression fracture of L1 lumbar vertebra ([place])   CAP (community acquired pneumonia)   Other neutropenia ([place])  Thrombocytopenia ([place])    Counseling regarding advance care planning and goals of care   Neutropenic sepsis ([place])   Neutropenia [facility])    Discharge Instructions  Discharge Instructions     Care order/instruction   Complete by: As directed    Transfuse Parameters   Type and screen   Complete by: As directed    [place]       Allergies as of 09/20/2022   No Known Allergies      Medication List     STOP taking these medications    amoxicillin-clavulanate 875-125 MG tablet Commonly known as: AUGMENTIN   Aranesp (Albumin Free) 150 MCG/0.3ML Sosy injection Generic drug: Darbepoetin Alfa   Azelastine HCl 137 MCG/SPRAY Soln   b complex vitamins capsule   cholecalciferol 1000 units tablet Commonly known as: VITAMIN D   DELSYM PO   guaiFENesin 600 MG 12 hr tablet Commonly known as: MUCINEX   levothyroxine 125 MCG tablet Commonly known as: SYNTHROID   MULTI FOR HER 50+ PO   nystatin 100000 UNIT/ML suspension Commonly known as: MYCOSTATIN   OVER THE COUNTER MEDICATION   oxyCODONE 5 MG immediate release tablet Commonly known as: Oxy IR/ROXICODONE   pantoprazole 40 MG tablet Commonly known as: PROTONIX   senna-docusate 8.6-50 MG tablet Commonly known as: Senokot-S   sertraline 100 MG tablet Commonly known as: ZOLOFT   Trelegy Ellipta 100-62.5-25 MCG/ACT Aepb Generic drug: Fluticasone-Umeclidin-Vilant   VITAMIN C PO        No Known Allergies  Consultations: Oncology, palliative care   Procedures/Studies: DG Lumbar Spine Complete  Result Date: 09/16/2022 CLINICAL DATA:  Fall, back pain EXAM: LUMBAR SPINE - COMPLETE 4+ VIEW COMPARISON:  CT 07/21/2022 FINDINGS: There are compression deformities of T12 with 20-30% loss of height and anteriorly at L1 with 50% loss of height. Both fractures appear new since prior CT examination and fracture in T12 is acute to subacute in nature with widening of the paraspinal soft tissues likely related to surrounding  edema or hemorrhage. No retropulsion. The posterior wall of the vertebral bodies appear intact on this limited examination. Remaining vertebral body height is preserved. Stable grade 2 anterolisthesis L5-S1. Intervertebral disc space narrowing and endplate remodeling of [id number] again noted in keeping with changes of moderate to severe degenerative disc disease. Facet arthrosis at L5-S1 is not well profiled on this examination. Stable levoscoliosis of the lumbar spine, apex left at L5. Interval development of a mild dextrocurvature of the thoracolumbar spine, apex right at L1. IMPRESSION: 1. Suspected acute to subacute compression fracture of T12 with 50% loss of height. No retropulsion. Age indeterminate fracture L1 with 20-30% loss of height. Acuity could be confirmed with MRI examination if indicated. 2. Stable grade 2 anterolisthesis L5-S1. 3. Stable moderate to severe degenerative disc disease L3-S1. Electronically Signed   By: Fidela Salisbury M.D.   On: 09/16/2022 19:34   DG Chest 2 View  Result Date: 09/16/2022 CLINICAL DATA:  Cough EXAM: CHEST - 2 VIEW COMPARISON:  Chest x-ray 01/13/2022.  Chest CT 07/21/2022. FINDINGS: There are faint multifocal patchy airspace opacities in the right mid lung and left lower lung. Small bilateral pleural effusions. Aorta is ectatic. Cardiac silhouette is within normal limits. Hiatal hernia present. No acute fractures. IMPRESSION: 1. Faint multifocal patchy airspace opacities in the right mid lung and left lower lung concerning for multifocal pneumonia. 2. Small bilateral pleural effusions. Electronically Signed   By: Ronney Asters M.D.   On: 09/16/2022 19:29  DG Hip Unilat W or Wo Pelvis 2-3 Views Left  Result Date: 09/16/2022 CLINICAL DATA:  Fall EXAM: DG HIP (WITH OR WITHOUT PELVIS) 2-3V LEFT COMPARISON:  None Available. FINDINGS: Normal alignment. No acute fracture or dislocation. Degenerative changes are seen at the lumbosacral junction and hips bilaterally, right  greater than left. Soft tissues are unremarkable. IMPRESSION: 1. Degenerative change. No acute fracture or dislocation. Electronically Signed   By: Fidela Salisbury M.D.   On: 09/16/2022 19:28   DG Ankle Complete Right  Result Date: 09/10/2022 CLINICAL DATA:  Right ankle pain.  Possible injury yesterday. EXAM: RIGHT ANKLE - COMPLETE 3+ VIEW COMPARISON:  Right ankle radiographs 12/08/2019 FINDINGS: There is diffuse decreased bone mineralization. The ankle mortise is symmetric and intact. Tiny plantar calcaneal heel spur. Possible mild lateral malleolar soft tissue swelling, similar to prior. No acute fracture is seen. No dislocation. IMPRESSION: Possible mild lateral malleolar soft tissue swelling. No acute fracture. Electronically Signed   By: Yvonne Kendall M.D.   On: 09/10/2022 17:37      Subjective: Seen and examined at bedside today.  She was mildly agitated this morning but during my evaluation , she was overall comfortable, cooperative and lying in bed.  Not in distress.  I discussed with family friend at the bedside. I also called the daughter-in-law on phone and discussed about the plan  Discharge Exam: Vitals:   09/20/22 0631 09/20/22 0719  BP: (!) 151/68   Pulse: 85   Resp: 17   Temp: 97.9 F (36.6 C)   SpO2: 92% 93%   Vitals:   09/19/22 1359 09/19/22 2057 09/20/22 0631 09/20/22 0719  BP: (!) 145/63 136/60 (!) 151/68   Pulse: 88 89 85   Resp: 18 17 17   $ Temp: (!) 97.5 F (36.4 C) 98.6 F (37 C) 97.9 F (36.6 C)   TempSrc: Oral Oral Oral   SpO2: 94% 93% 92% 93%  Weight:      Height:        General: Pt is alert, awake, not in acute distress,very deconditioned Cardiovascular: RRR, S1/S2 +, no rubs, no gallops Respiratory: diminished air sounds bilaterally,no wheezing, no rhonchi Abdominal: Soft, NT, ND, bowel sounds + Extremities: no edema, no cyanosis    The results of significant diagnostics from this hospitalization (including imaging, microbiology, ancillary and  laboratory) are listed below for reference.     Microbiology: Recent Results (from the past 240 hour(s))  Blood Culture (routine x 2)     Status: None (Preliminary result)   Collection Time: 09/16/22  6:40 PM   Specimen: BLOOD  Result Value Ref Range Status   Specimen Description   Final    BLOOD BLOOD RIGHT ARM Performed at [facility] [address] [place], [place] [address]    Special Requests   Final    BOTTLES DRAWN AEROBIC AND ANAEROBIC Blood Culture adequate volume Performed at [facility] [address] [place], [place] [address]    Culture   Final    NO GROWTH 4 DAYS Performed at [facility], [place] [address] [place], [place] [address]    Report Status PENDING  Incomplete  Blood Culture (routine x 2)     Status: None (Preliminary result)   Collection Time: 09/16/22  6:57 PM   Specimen: BLOOD  Result Value Ref Range Status   Specimen Description   Final    BLOOD BLOOD LEFT ARM Performed at [facility] [address]  [place], [place] [address]    Special Requests   Final    BOTTLES DRAWN AEROBIC AND ANAEROBIC Blood Culture adequate volume Performed at [facility] [address], [place], [place] [address]    Culture   Final    NO GROWTH 4 DAYS Performed at [facility], [place] [address], [place], [place] [address]    Report Status PENDING  Incomplete  Resp panel by RT-PCR (RSV, Flu A&B, Covid) Anterior Nasal Swab     Status: None   Collection Time: 09/16/22  7:05 PM   Specimen: Anterior Nasal Swab  Result Value Ref Range Status   SARS Coronavirus 2 by RT PCR NEGATIVE NEGATIVE Final    Comment: (NOTE) SARS-CoV-2 target nucleic acids are NOT DETECTED.  The SARS-CoV-2 RNA is generally detectable in upper respiratory specimens during the acute phase of infection. The lowest concentration of SARS-CoV-2 viral copies this assay can detect is 138 copies/mL. A negative  result does not preclude SARS-Cov-2 infection and should not be used as the sole basis for treatment or other patient management decisions. A negative result may occur with  improper specimen collection/handling, submission of specimen other than nasopharyngeal swab, presence of viral mutation(s) within the areas targeted by this assay, and inadequate number of viral copies(<138 copies/mL). A negative result must be combined with clinical observations, patient history, and epidemiological information. The expected result is Negative.  Fact Sheet for Patients:  EntrepreneurPulse.com.au  Fact Sheet for Healthcare Providers:  IncredibleEmployment.be  This test is no t yet approved or cleared by the [place] FDA and  has been authorized for detection and/or diagnosis of SARS-CoV-2 by FDA under an Emergency Use Authorization (EUA). This EUA will remain  in effect (meaning this test can be used) for the duration of the COVID-19 declaration under Section 564(b)(1) of the Act, 21 U.S.C.section 360bbb-3(b)(1), unless the authorization is terminated  or revoked sooner.       Influenza A by PCR NEGATIVE NEGATIVE Final   Influenza B by PCR NEGATIVE NEGATIVE Final    Comment: (NOTE) The Xpert Xpress SARS-CoV-2/FLU/RSV plus assay is intended as an aid in the diagnosis of influenza from Nasopharyngeal swab specimens and should not be used as a sole basis for treatment. Nasal washings and aspirates are unacceptable for Xpert Xpress SARS-CoV-2/FLU/RSV testing.  Fact Sheet for Patients: EntrepreneurPulse.com.au  Fact Sheet for Healthcare Providers: IncredibleEmployment.be  This test is not yet approved or cleared by the [place] FDA and has been authorized for detection and/or diagnosis of SARS-CoV-2 by FDA under an Emergency Use Authorization (EUA). This EUA will remain in effect (meaning this test can be used)  for the duration of the COVID-19 declaration under Section 564(b)(1) of the Act, 21 U.S.C. section 360bbb-3(b)(1), unless the authorization is terminated or revoked.     Resp Syncytial Virus by PCR NEGATIVE NEGATIVE Final    Comment: (NOTE) Fact Sheet for Patients: EntrepreneurPulse.com.au  Fact Sheet for Healthcare Providers: IncredibleEmployment.be  This test is not yet approved or cleared by the [place] FDA and has been authorized for detection and/or diagnosis of SARS-CoV-2 by FDA under an Emergency Use Authorization (EUA). This EUA will remain in effect (meaning this test can be used) for the duration of the COVID-19 declaration under Section 564(b)(1) of the Act, 21 U.S.C. section 360bbb-3(b)(1), unless the authorization is terminated or revoked.  Performed at [facility], [place] [address], [place], [place] [address]      Labs: BNP (last 3  results) Recent Labs    01/13/22 2211  BNP [id number]   Basic Metabolic Panel: Recent Labs  Lab 09/16/22 1840 09/17/22 0610 09/18/22 0646 09/19/22 0629 09/20/22 0611  NA 132* 136 134* 136 140  K 3.8 3.3* 3.3* 3.0* 3.3*  CL 106 107 105 105 109  CO2 19* 21* 19* 19* 19*  GLUCOSE 109* 104* 102* 109* 110*  BUN 39* 39* 33* 32* 31*  CREATININE 1.58* 1.59* 1.54* 1.69* 1.85*  CALCIUM 7.9* 7.8* 7.4* 7.7* 8.0*  MG  --   --   --  1.7  --    Liver Function Tests: Recent Labs  Lab 09/16/22 1840  AST 20  ALT 17  ALKPHOS 84  BILITOT 1.2  PROT 6.9  ALBUMIN 2.7*   No results for input(s): "LIPASE", "AMYLASE" in the last 168 hours. No results for input(s): "AMMONIA" in the last 168 hours. CBC: Recent Labs  Lab 09/16/22 1840 09/17/22 0610 09/18/22 0646 09/19/22 0629 09/20/22 0611  WBC 1.9* 1.8* 3.7* 3.0* 3.2*  NEUTROABS 0.7*  --   --  1.8  --   HGB 7.3* 7.2* 6.8* 8.4* 8.5*  HCT 22.5* 23.2* 21.6* 26.6* 26.5*  MCV 99.1 100.4* 100.0 97.4 97.1  PLT 21* 17* 17* 10* 5*    Cardiac Enzymes: No results for input(s): "CKTOTAL", "CKMB", "CKMBINDEX", "TROPONINI" in the last 168 hours. BNP: Invalid input(s): "POCBNP" CBG: No results for input(s): "GLUCAP" in the last 168 hours. D-Dimer No results for input(s): "DDIMER" in the last 72 hours. Hgb A1c No results for input(s): "HGBA1C" in the last 72 hours. Lipid Profile No results for input(s): "CHOL", "HDL", "LDLCALC", "TRIG", "CHOLHDL", "LDLDIRECT" in the last 72 hours. Thyroid function studies No results for input(s): "TSH", "T4TOTAL", "T3FREE", "THYROIDAB" in the last 72 hours.  Invalid input(s): "FREET3" Anemia work up No results for input(s): "VITAMINB12", "FOLATE", "FERRITIN", "TIBC", "IRON", "RETICCTPCT" in the last 72 hours. Urinalysis    Component Value Date/Time   COLORURINE YELLOW 09/17/2022 0605   APPEARANCEUR TURBID (A) 09/17/2022 0605   LABSPEC 1.017 09/17/2022 0605   PHURINE 5.0 09/17/2022 0605   GLUCOSEU NEGATIVE 09/17/2022 0605   HGBUR SMALL (A) 09/17/2022 0605   BILIRUBINUR NEGATIVE 09/17/2022 0605   KETONESUR NEGATIVE 09/17/2022 0605   PROTEINUR 100 (A) 09/17/2022 0605   NITRITE NEGATIVE 09/17/2022 0605   LEUKOCYTESUR NEGATIVE 09/17/2022 0605   Sepsis Labs Recent Labs  Lab 09/17/22 0610 09/18/22 0646 09/19/22 0629 09/20/22 0611  WBC 1.8* 3.7* 3.0* 3.2*   Microbiology Recent Results (from the past 240 hour(s))  Blood Culture (routine x 2)     Status: None (Preliminary result)   Collection Time: 09/16/22  6:40 PM   Specimen: BLOOD  Result Value Ref Range Status   Specimen Description   Final    BLOOD BLOOD RIGHT ARM Performed at [facility], [place] [address] [place], [place] [address]    Special Requests   Final    BOTTLES DRAWN AEROBIC AND ANAEROBIC Blood Culture adequate volume Performed at [facility] [address] [place], [place] [address]    Culture   Final    NO GROWTH 4 DAYS Performed at [facility],  [place] [address] [place], [place] [address]    Report Status PENDING  Incomplete  Blood Culture (routine x 2)     Status: None (Preliminary result)   Collection Time: 09/16/22  6:57 PM   Specimen: BLOOD  Result Value Ref Range Status   Specimen Description  Final    BLOOD BLOOD LEFT ARM Performed at [facility] [address], [place], [place] [address]    Special Requests   Final    BOTTLES DRAWN AEROBIC AND ANAEROBIC Blood Culture adequate volume Performed at [facility] [address], [place], [place] [address]    Culture   Final    NO GROWTH 4 DAYS Performed at [facility], [place] [address], [place], [place] [address]    Report Status PENDING  Incomplete  Resp panel by RT-PCR (RSV, Flu A&B, Covid) Anterior Nasal Swab     Status: None   Collection Time: 09/16/22  7:05 PM   Specimen: Anterior Nasal Swab  Result Value Ref Range Status   SARS Coronavirus 2 by RT PCR NEGATIVE NEGATIVE Final    Comment: (NOTE) SARS-CoV-2 target nucleic acids are NOT DETECTED.  The SARS-CoV-2 RNA is generally detectable in upper respiratory specimens during the acute phase of infection. The lowest concentration of SARS-CoV-2 viral copies this assay can detect is 138 copies/mL. A negative result does not preclude SARS-Cov-2 infection and should not be used as the sole basis for treatment or other patient management decisions. A negative result may occur with  improper specimen collection/handling, submission of specimen other than nasopharyngeal swab, presence of viral mutation(s) within the areas targeted by this assay, and inadequate number of viral copies(<138 copies/mL). A negative result must be combined with clinical observations, patient history, and epidemiological information. The expected result is Negative.  Fact Sheet for Patients:  EntrepreneurPulse.com.au  Fact Sheet for Healthcare Providers:   IncredibleEmployment.be  This test is no t yet approved or cleared by the [place] FDA and  has been authorized for detection and/or diagnosis of SARS-CoV-2 by FDA under an Emergency Use Authorization (EUA). This EUA will remain  in effect (meaning this test can be used) for the duration of the COVID-19 declaration under Section 564(b)(1) of the Act, 21 U.S.C.section 360bbb-3(b)(1), unless the authorization is terminated  or revoked sooner.       Influenza A by PCR NEGATIVE NEGATIVE Final   Influenza B by PCR NEGATIVE NEGATIVE Final    Comment: (NOTE) The Xpert Xpress SARS-CoV-2/FLU/RSV plus assay is intended as an aid in the diagnosis of influenza from Nasopharyngeal swab specimens and should not be used as a sole basis for treatment. Nasal washings and aspirates are unacceptable for Xpert Xpress SARS-CoV-2/FLU/RSV testing.  Fact Sheet for Patients: EntrepreneurPulse.com.au  Fact Sheet for Healthcare Providers: IncredibleEmployment.be  This test is not yet approved or cleared by the [place] FDA and has been authorized for detection and/or diagnosis of SARS-CoV-2 by FDA under an Emergency Use Authorization (EUA). This EUA will remain in effect (meaning this test can be used) for the duration of the COVID-19 declaration under Section 564(b)(1) of the Act, 21 U.S.C. section 360bbb-3(b)(1), unless the authorization is terminated or revoked.     Resp Syncytial Virus by PCR NEGATIVE NEGATIVE Final    Comment: (NOTE) Fact Sheet for Patients: EntrepreneurPulse.com.au  Fact Sheet for Healthcare Providers: IncredibleEmployment.be  This test is not yet approved or cleared by the [place] FDA and has been authorized for detection and/or diagnosis of SARS-CoV-2 by FDA under an Emergency Use Authorization (EUA). This EUA will remain in effect (meaning this test can be used) for  the duration of the COVID-19 declaration under Section 564(b)(1) of the Act, 21 U.S.C. section 360bbb-3(b)(1), unless the authorization is terminated or revoked.  Performed at [facility], [place] [address], [place], [place] [address]     Please note: You were cared for by a hospitalist during your hospital stay. Once you are discharged, your primary care physician will handle any further medical issues. Please note that NO REFILLS for any discharge medications will be authorized once you are discharged, as it is imperative that you return to your primary care physician (or establish a relationship with a primary care physician if you do not have one) for your post hospital discharge needs so that they can reassess your need for medications and monitor your lab values.    Time coordinating discharge: 40 minutes  SIGNED:   Shelly Coss, MD  [facility] 09/20/2022, 11:40 AM Pager [phone]  If 7PM-7AM, please contact night-coverage [email or web address] Password [email or web address]

## 2022-09-20 NOTE — Progress Notes (Signed)
PT Cancellation Note  Patient Details Name: Alison Bridges MRN: [id number] DOB: 03-Nov-1928   Cancelled Treatment:    Reason Eval/Treat Not Completed: Other (comment). Pt being discharged to residential hospice, PT will sign off at this time.    Talbot Grumbling PT, DPT 09/20/22, 1:07 PM

## 2022-09-20 NOTE — Plan of Care (Signed)
Patient AOX4, VSS throughout shift.  All meds given on time as ordered.  Pt dressed and cleaned for transport.  Pt safely transported off unit via 2 transporters with all belongings at side.  Report given to facility pt going to.   Problem: Education: Goal: Knowledge of General Education information will improve Description: Including pain rating scale, medication(s)/side effects and non-pharmacologic comfort measures Outcome: Progressing   Problem: Health Behavior/Discharge Planning: Goal: Ability to manage health-related needs will improve Outcome: Progressing   Problem: Clinical Measurements: Goal: Ability to maintain clinical measurements within normal limits will improve Outcome: Progressing Goal: Will remain free from infection Outcome: Progressing Goal: Diagnostic test results will improve Outcome: Progressing Goal: Respiratory complications will improve Outcome: Progressing Goal: Cardiovascular complication will be avoided Outcome: Progressing   Problem: Activity: Goal: Risk for activity intolerance will decrease Outcome: Progressing   Problem: Nutrition: Goal: Adequate nutrition will be maintained Outcome: Progressing   Problem: Coping: Goal: Level of anxiety will decrease Outcome: Progressing   Problem: Elimination: Goal: Will not experience complications related to bowel motility Outcome: Progressing Goal: Will not experience complications related to urinary retention Outcome: Progressing   Problem: Pain Managment: Goal: General experience of comfort will improve Outcome: Progressing   Problem: Safety: Goal: Ability to remain free from injury will improve Outcome: Progressing   Problem: Skin Integrity: Goal: Risk for impaired skin integrity will decrease Outcome: Progressing   Problem: Activity: Goal: Ability to tolerate increased activity will improve Outcome: Progressing   Problem: Clinical Measurements: Goal: Ability to maintain a body  temperature in the normal range will improve Outcome: Progressing   Problem: Respiratory: Goal: Ability to maintain adequate ventilation will improve Outcome: Progressing Goal: Ability to maintain a clear airway will improve Outcome: Progressing

## 2022-09-20 NOTE — Plan of Care (Signed)

## 2022-09-21 LAB — CULTURE, BLOOD (ROUTINE X 2)
Culture: NO GROWTH
Culture: NO GROWTH
Special Requests: ADEQUATE
Special Requests: ADEQUATE

## 2022-09-23 ENCOUNTER — Telehealth: Payer: Self-pay

## 2022-09-23 NOTE — Telephone Encounter (Signed)
        Patient  visited [place] on 2/6     Telephone encounter attempt :  1st  A HIPAA compliant voice message was left requesting a return call.  Instructed patient to call back .    [facility] [phone] [address], [place],  [address] Phone: [phone] Email: [email or web address]

## 2022-09-24 ENCOUNTER — Telehealth: Payer: Self-pay

## 2022-09-24 NOTE — Telephone Encounter (Signed)
        Patient  visited [place] on 2/6    Telephone encounter attempt :  2nd  A HIPAA compliant voice message was left requesting a return call.  Instructed patient to call back.    [facility] [phone] [address], [place], [place] [address] Phone: [phone] Email: [email or web address]

## 2022-10-15 ENCOUNTER — Inpatient Hospital Stay: Payer: BLUE CROSS/BLUE SHIELD

## 2022-10-15 ENCOUNTER — Other Ambulatory Visit: Payer: Self-pay

## 2022-10-15 ENCOUNTER — Encounter: Payer: Self-pay | Admitting: Hematology

## 2022-10-15 DIAGNOSIS — D649 Anemia, unspecified: Secondary | ICD-10-CM

## 2022-10-15 DIAGNOSIS — D469 Myelodysplastic syndrome, unspecified: Secondary | ICD-10-CM

## 2022-11-04 DEATH — deceased

## 2022-11-12 ENCOUNTER — Other Ambulatory Visit: Payer: Medicare Other

## 2022-11-12 ENCOUNTER — Ambulatory Visit: Payer: Medicare Other

## 2022-12-10 ENCOUNTER — Ambulatory Visit: Payer: Medicare Other

## 2022-12-10 ENCOUNTER — Other Ambulatory Visit: Payer: Medicare Other

## 2023-01-07 ENCOUNTER — Other Ambulatory Visit: Payer: Medicare Other

## 2023-01-07 ENCOUNTER — Ambulatory Visit: Payer: Medicare Other

## 2023-02-04 ENCOUNTER — Other Ambulatory Visit: Payer: Medicare Other

## 2023-02-04 ENCOUNTER — Ambulatory Visit: Payer: Medicare Other

## 2023-03-11 ENCOUNTER — Other Ambulatory Visit: Payer: Medicare Other

## 2023-03-11 ENCOUNTER — Ambulatory Visit: Payer: Medicare Other

## 2023-09-04 IMAGING — CT CT ABD-PELV W/ CM
2 of 4 series · 15 of 46 positions shown, 17 images · IV contrast (agent unspecified)
Comparison: 08/14/2020

CLINICAL DATA: Abdominal pain

EXAM:
CT ABDOMEN AND PELVIS WITH CONTRAST
TECHNIQUE: Multidetector CT imaging of the abdomen and pelvis was performed
using the standard protocol following bolus administration of
intravenous contrast.

[Series 2: axial st · axial · 0.89mm/px · z∈[-352,+58]mm · 12 of 92 slices shown, 14 images]
[im 5/92  soft-tissue]
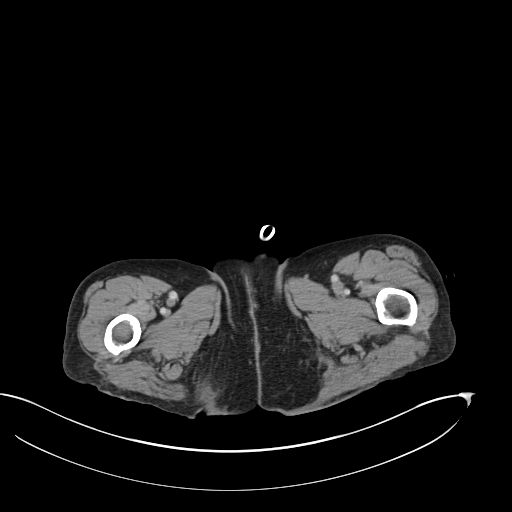
[im 5/92  bone]
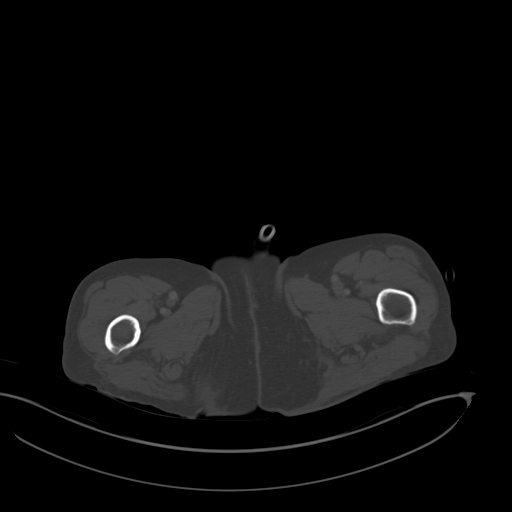
[im 13/92  soft-tissue]
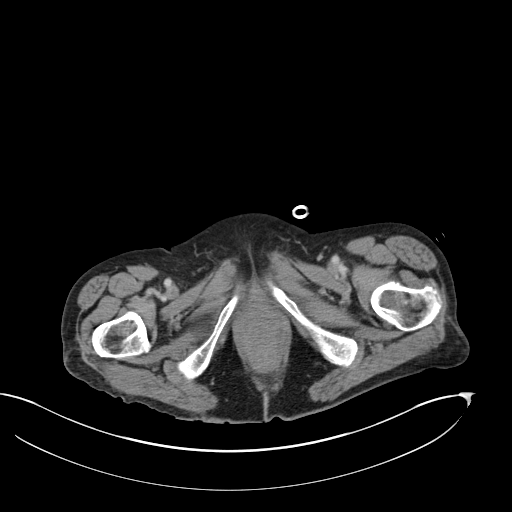
[im 21/92  soft-tissue]
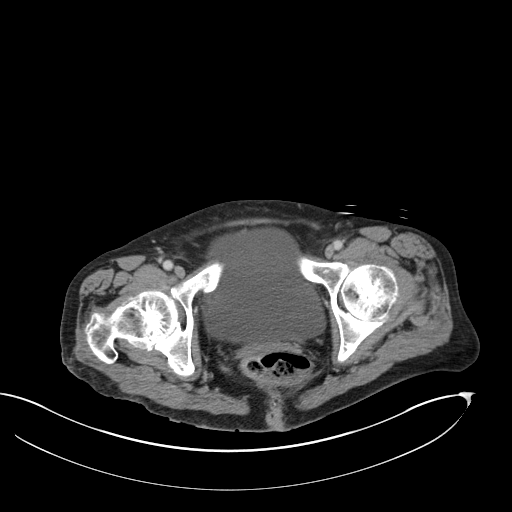
[im 29/92  soft-tissue]
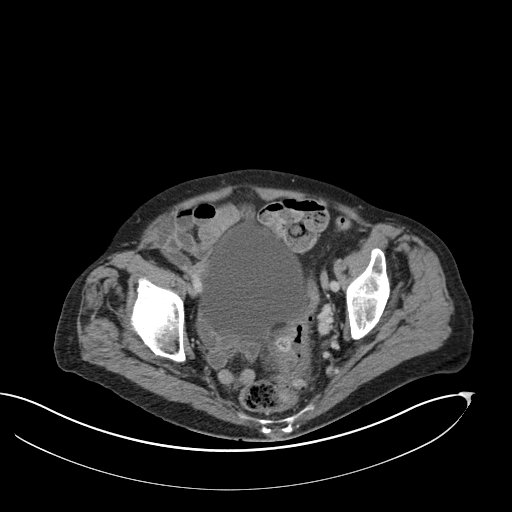
[im 34/92  soft-tissue]
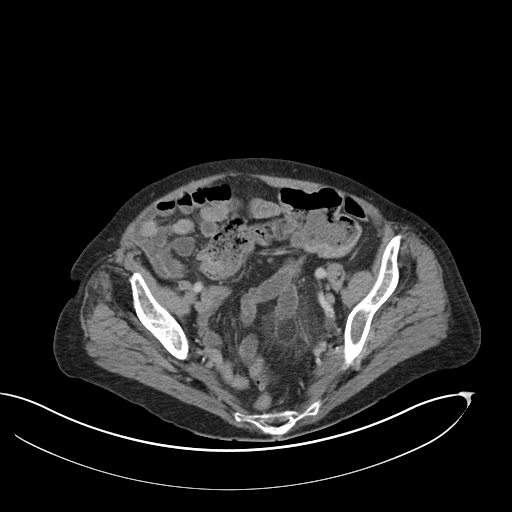
[im 42/92  soft-tissue]
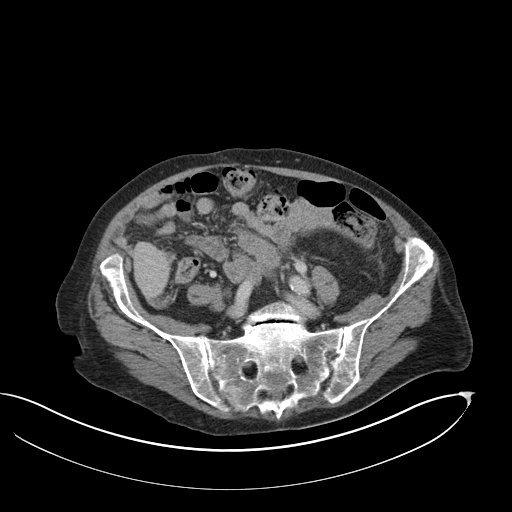
[im 50/92  soft-tissue]
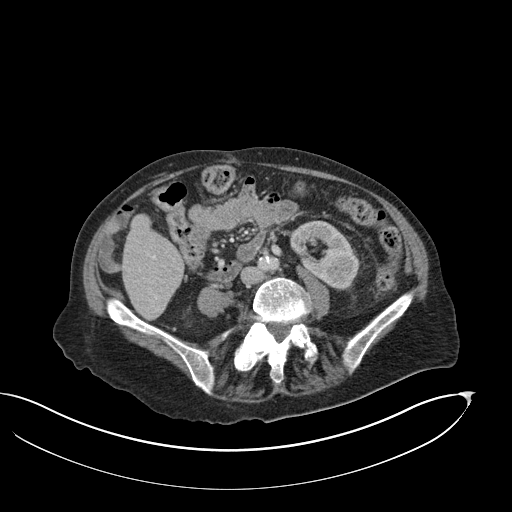
[im 58/92  soft-tissue]
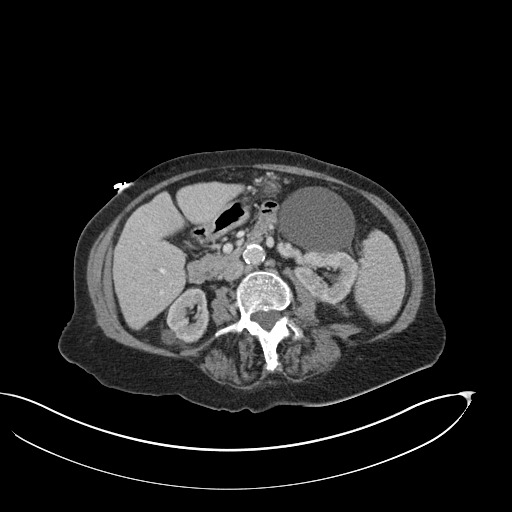
[im 63/92  soft-tissue]
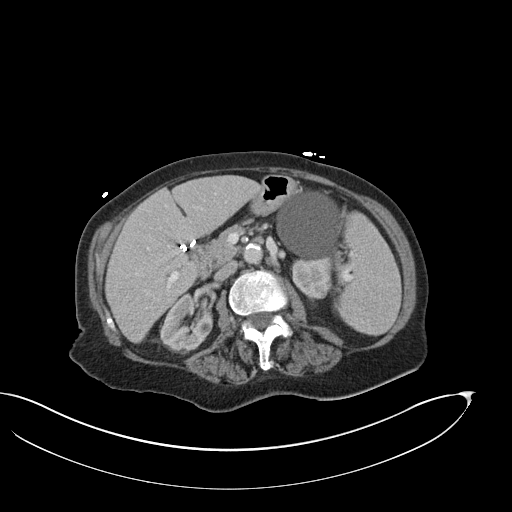
[im 63/92  bone]
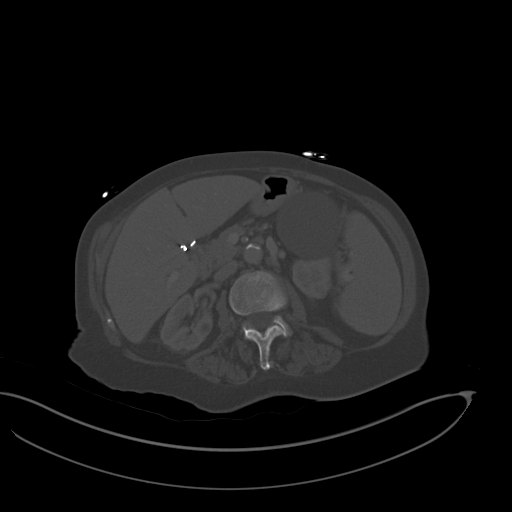
[im 71/92  soft-tissue]
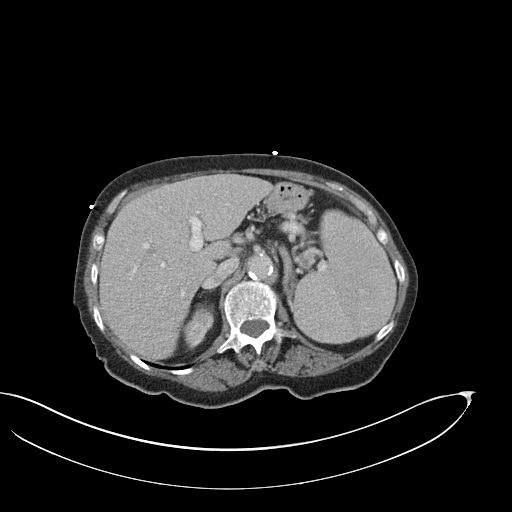
[im 79/92  soft-tissue]
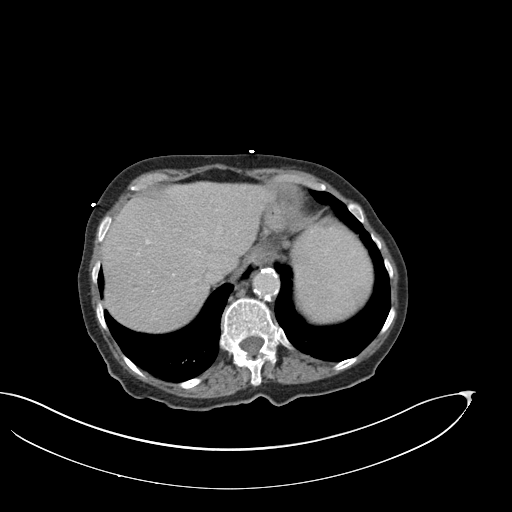
[im 87/92  soft-tissue]
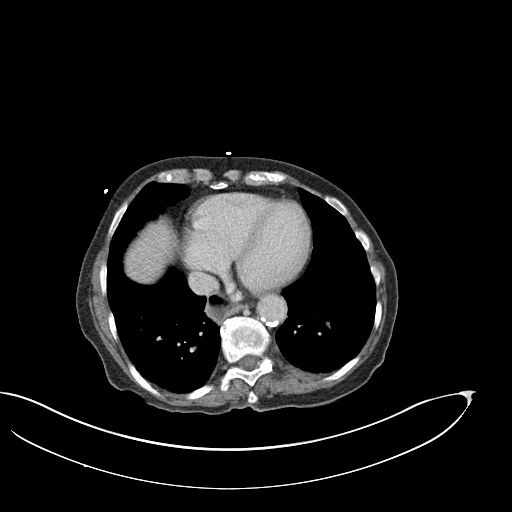

[Series 4: coronal st · coronal · 0.75mm/px · 3 of 136 slices shown]
[im 46/136  soft-tissue]
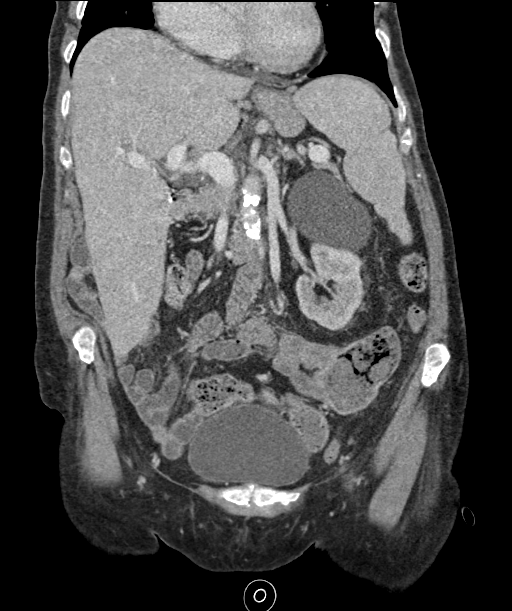
[im 61/136  soft-tissue]
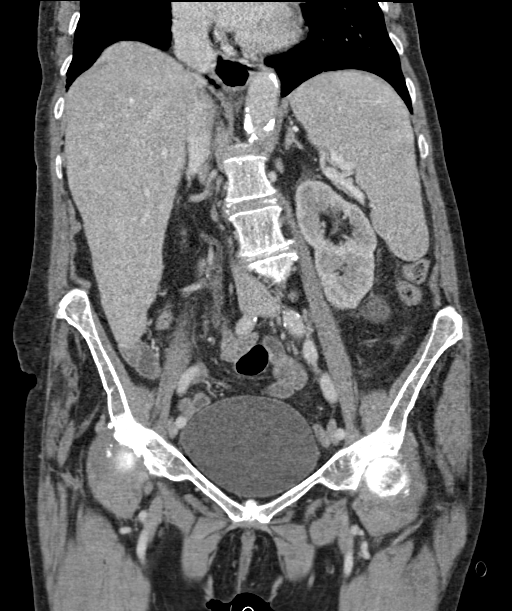
[im 76/136  soft-tissue]
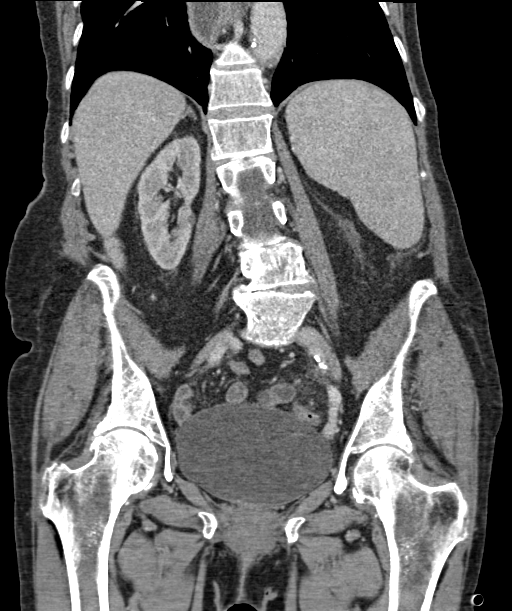

[15 of 46 positions shown; findings below may reference images not displayed]

RADIATION DOSE REDUCTION: This exam was performed according to the
departmental dose-optimization program which includes automated
exposure control, adjustment of the mA and/or kV according to
patient size and/or use of iterative reconstruction technique.

CONTRAST:  80mL OMNIPAQUE IOHEXOL 300 MG/ML  SOLN
FINDINGS: Lower chest: Small linear densities in the lower lung fields may
suggest scarring or subsegmental atelectasis.

Hepatobiliary: Liver measures 24.8 cm in length. No focal
abnormality is seen. There is no dilation of bile ducts. Surgical
clips are seen in gallbladder fossa.

Pancreas: No focal abnormality is seen.

Spleen: Spleen is enlarged measuring 15.5 cm in maximum diameter.

Adrenals/Urinary Tract: Adrenals are unremarkable. There is no
hydronephrosis. There are few small left renal stones largest
measuring 5 mm. There are few bilateral renal cysts largest
measuring 6.9 cm in the anterior midportion of left kidney. Urinary
bladder is unremarkable.

Stomach/Bowel: There is fluid in the lumen of lower thoracic
esophagus suggesting gastroesophageal reflux. Stomach is not
distended. Small bowel loops are not dilated. Cecum is lying to the
left of midline. Appendix is not seen. Scattered diverticula are
seen in the colon. There is wall thickening and pericolic stranding
in and around sigmoid colon in the left side of pelvis. There is no
loculated pericolic abscess.

Vascular/Lymphatic: There are scattered arterial calcifications.

Reproductive: Uterus is not seen.

Other: There is no ascites or pneumoperitoneum.

Musculoskeletal: There is grade 1 anterolisthesis at L5-S1 level.
Degenerative changes are noted with disc space narrowing, bony spurs
and facet hypertrophy at multiple levels. There is spinal stenosis
and encroachment of neural foramina at multiple levels.
IMPRESSION: Diverticulosis of colon. There is wall thickening along with
pericolic stranding in the sigmoid colon in the left side of pelvis
consistent with acute diverticulitis. There is no loculated
pericolic abscess.

There is no evidence of intestinal obstruction. There is no
hydronephrosis.

Marked gastroesophageal reflux. There are few nonobstructing left
renal stones. There are few bilateral renal cysts.

Enlarged liver and spleen.

Other findings as described in the body of the report.

## 2023-09-04 IMAGING — DX DG CHEST 1V PORT
1 series · 1 of 1 positions shown · non-contrast
Comparison: None Available.

CLINICAL DATA: Cough, abdominal pain

EXAM:
PORTABLE CHEST 1 VIEW

[chest ap]
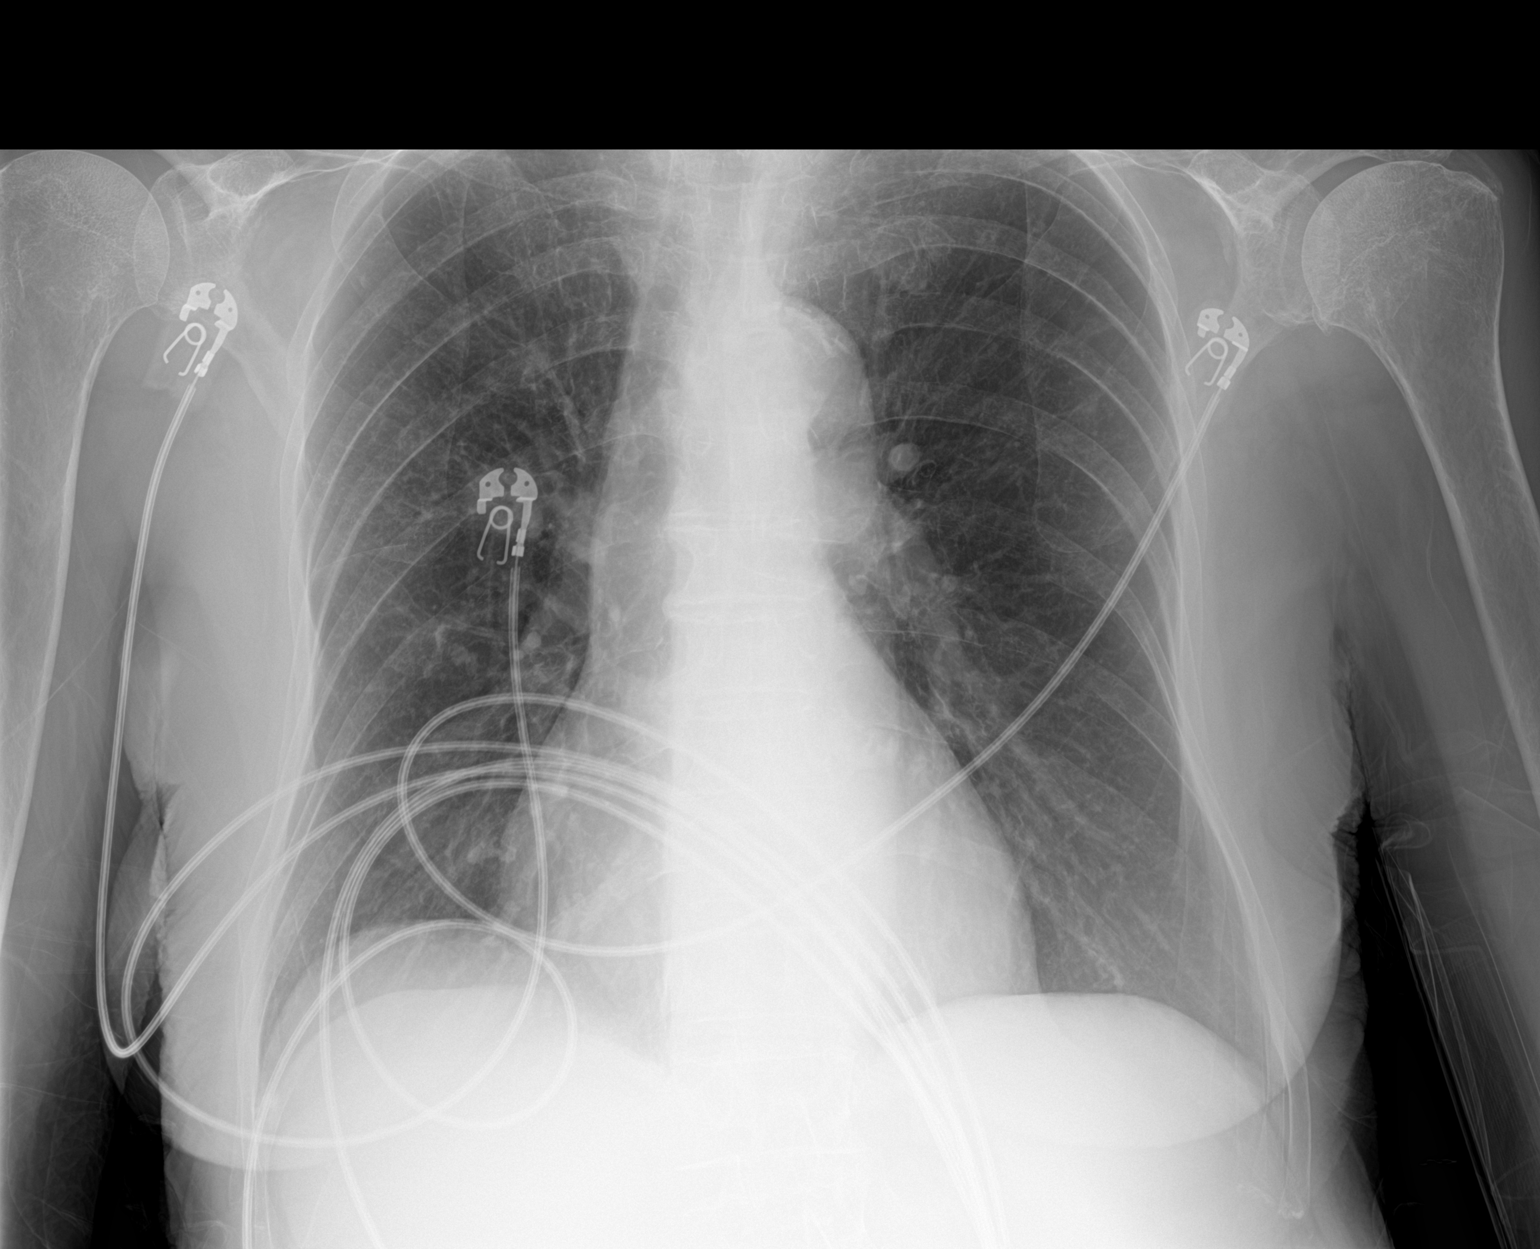

[1 of 1 positions shown; findings below may reference images not displayed]

FINDINGS: Normal mediastinum and cardiac silhouette. Normal pulmonary
vasculature. No evidence of effusion, infiltrate, or pneumothorax.
No acute bony abnormality.
IMPRESSION: No acute cardiopulmonary process.
